# Patient Record
Sex: Male | Born: 1960 | Race: Black or African American | Hispanic: No | Smoking: Never smoker
Health system: Southern US, Community
[De-identification: ages and names within clinical notes are randomized; demographics above are authoritative.]

## PROBLEM LIST (undated history)

## (undated) DIAGNOSIS — E785 Hyperlipidemia, unspecified: Secondary | ICD-10-CM

## (undated) DIAGNOSIS — I471 Supraventricular tachycardia, unspecified: Secondary | ICD-10-CM

## (undated) DIAGNOSIS — F1010 Alcohol abuse, uncomplicated: Secondary | ICD-10-CM

## (undated) DIAGNOSIS — C801 Malignant (primary) neoplasm, unspecified: Secondary | ICD-10-CM

## (undated) DIAGNOSIS — R7303 Prediabetes: Secondary | ICD-10-CM

## (undated) DIAGNOSIS — O2230 Deep phlebothrombosis in pregnancy, unspecified trimester: Secondary | ICD-10-CM

## (undated) DIAGNOSIS — R1030 Lower abdominal pain, unspecified: Secondary | ICD-10-CM

## (undated) DIAGNOSIS — I639 Cerebral infarction, unspecified: Secondary | ICD-10-CM

## (undated) DIAGNOSIS — I82409 Acute embolism and thrombosis of unspecified deep veins of unspecified lower extremity: Secondary | ICD-10-CM

## (undated) DIAGNOSIS — N289 Disorder of kidney and ureter, unspecified: Secondary | ICD-10-CM

## (undated) DIAGNOSIS — I2510 Atherosclerotic heart disease of native coronary artery without angina pectoris: Secondary | ICD-10-CM

## (undated) DIAGNOSIS — I493 Ventricular premature depolarization: Secondary | ICD-10-CM

## (undated) DIAGNOSIS — I739 Peripheral vascular disease, unspecified: Secondary | ICD-10-CM

## (undated) DIAGNOSIS — Z1211 Encounter for screening for malignant neoplasm of colon: Secondary | ICD-10-CM

## (undated) DIAGNOSIS — I2109 ST elevation (STEMI) myocardial infarction involving other coronary artery of anterior wall: Secondary | ICD-10-CM

## (undated) DIAGNOSIS — M1990 Unspecified osteoarthritis, unspecified site: Secondary | ICD-10-CM

## (undated) DIAGNOSIS — I2699 Other pulmonary embolism without acute cor pulmonale: Secondary | ICD-10-CM

## (undated) DIAGNOSIS — R350 Frequency of micturition: Secondary | ICD-10-CM

## (undated) DIAGNOSIS — R300 Dysuria: Secondary | ICD-10-CM

## (undated) DIAGNOSIS — I10 Essential (primary) hypertension: Secondary | ICD-10-CM

## (undated) DIAGNOSIS — M5020 Other cervical disc displacement, unspecified cervical region: Secondary | ICD-10-CM

## (undated) DIAGNOSIS — I998 Other disorder of circulatory system: Secondary | ICD-10-CM

## (undated) DIAGNOSIS — I491 Atrial premature depolarization: Secondary | ICD-10-CM

## (undated) HISTORY — DX: Atrial premature depolarization: I49.1

## (undated) HISTORY — DX: Alcohol abuse, uncomplicated: F10.10

## (undated) HISTORY — DX: Hyperlipidemia, unspecified: E78.5

## (undated) HISTORY — DX: Ventricular premature depolarization: I49.3

## (undated) HISTORY — DX: Supraventricular tachycardia, unspecified: I47.10

## (undated) HISTORY — DX: Essential (primary) hypertension: I10

## (undated) HISTORY — PX: DOPPLER ECHOCARDIOGRAPHY: SHX263

## (undated) HISTORY — DX: Other pulmonary embolism without acute cor pulmonale: I26.99

## (undated) HISTORY — DX: Disorder of kidney and ureter, unspecified: N28.9

## (undated) HISTORY — DX: ST elevation (STEMI) myocardial infarction involving other coronary artery of anterior wall: I21.09

## (undated) HISTORY — DX: Supraventricular tachycardia: I47.1

## (undated) HISTORY — DX: Prediabetes: R73.03

## (undated) HISTORY — DX: Atherosclerotic heart disease of native coronary artery without angina pectoris: I25.10

## (undated) HISTORY — PX: LEG SURGERY: SHX1003

## (undated) HISTORY — DX: Deep phlebothrombosis in pregnancy, unspecified trimester: O22.30

---

## 2011-03-31 ENCOUNTER — Inpatient Hospital Stay (HOSPITAL_COMMUNITY)
Admission: EM | Admit: 2011-03-31 | Discharge: 2011-04-03 | DRG: 247 | Disposition: A | Discharge status: Home / Self Care | Payer: Self-pay | Source: Ambulatory Visit | Attending: Cardiovascular Disease | Admitting: Cardiovascular Disease

## 2011-03-31 ENCOUNTER — Emergency Department (HOSPITAL_COMMUNITY)
Admission: EM | Admit: 2011-03-31 | Discharge: 2011-03-31 | Disposition: A | Discharge status: Nursing Home (Includes ICF) | Payer: Self-pay | Attending: Emergency Medicine | Admitting: Emergency Medicine

## 2011-03-31 DIAGNOSIS — F17200 Nicotine dependence, unspecified, uncomplicated: Secondary | ICD-10-CM | POA: Insufficient documentation

## 2011-03-31 DIAGNOSIS — F1010 Alcohol abuse, uncomplicated: Secondary | ICD-10-CM | POA: Diagnosis present

## 2011-03-31 DIAGNOSIS — Z23 Encounter for immunization: Secondary | ICD-10-CM

## 2011-03-31 DIAGNOSIS — I2109 ST elevation (STEMI) myocardial infarction involving other coronary artery of anterior wall: Principal | ICD-10-CM | POA: Diagnosis present

## 2011-03-31 DIAGNOSIS — R079 Chest pain, unspecified: Secondary | ICD-10-CM

## 2011-03-31 DIAGNOSIS — I2510 Atherosclerotic heart disease of native coronary artery without angina pectoris: Secondary | ICD-10-CM

## 2011-03-31 DIAGNOSIS — I10 Essential (primary) hypertension: Secondary | ICD-10-CM | POA: Diagnosis present

## 2011-03-31 DIAGNOSIS — N289 Disorder of kidney and ureter, unspecified: Secondary | ICD-10-CM | POA: Diagnosis not present

## 2011-03-31 DIAGNOSIS — Y9210 Unspecified residential institution as the place of occurrence of the external cause: Secondary | ICD-10-CM | POA: Diagnosis not present

## 2011-03-31 DIAGNOSIS — I219 Acute myocardial infarction, unspecified: Secondary | ICD-10-CM | POA: Insufficient documentation

## 2011-03-31 DIAGNOSIS — Z79899 Other long term (current) drug therapy: Secondary | ICD-10-CM

## 2011-03-31 DIAGNOSIS — L270 Generalized skin eruption due to drugs and medicaments taken internally: Secondary | ICD-10-CM | POA: Diagnosis not present

## 2011-03-31 DIAGNOSIS — T50995A Adverse effect of other drugs, medicaments and biological substances, initial encounter: Secondary | ICD-10-CM | POA: Diagnosis not present

## 2011-03-31 LAB — POCT I-STAT, CHEM 8
BUN: 13 mg/dL (ref 6–23)
Calcium, Ion: 1.14 mmol/L (ref 1.12–1.32)
Chloride: 98 mEq/L (ref 96–112)
Chloride: 96 mEq/L (ref 96–112)
HCT: 51 % (ref 39.0–52.0)
Potassium: 3.3 mEq/L — ABNORMAL LOW (ref 3.5–5.1)
Potassium: 4.2 mEq/L (ref 3.5–5.1)
Sodium: 135 mEq/L (ref 135–145)

## 2011-03-31 LAB — DIFFERENTIAL
Basophils Absolute: 0 10*3/uL (ref 0.0–0.1)
Lymphocytes Relative: 16 % (ref 12–46)
Neutro Abs: 7.4 10*3/uL (ref 1.7–7.7)
Neutrophils Relative %: 78 % — ABNORMAL HIGH (ref 43–77)

## 2011-03-31 LAB — CBC
HCT: 50.3 % (ref 39.0–52.0)
Hemoglobin: 17.2 g/dL — ABNORMAL HIGH (ref 13.0–17.0)
RBC: 5.94 MIL/uL — ABNORMAL HIGH (ref 4.22–5.81)
RDW: 13.7 % (ref 11.5–15.5)
WBC: 9.5 10*3/uL (ref 4.0–10.5)

## 2011-03-31 LAB — POCT ACTIVATED CLOTTING TIME
Activated Clotting Time: 160 seconds
Activated Clotting Time: 188 seconds
Activated Clotting Time: 678 seconds

## 2011-03-31 LAB — COMPREHENSIVE METABOLIC PANEL
ALT: 23 U/L (ref 0–53)
AST: 89 U/L — ABNORMAL HIGH (ref 0–37)
CO2: 26 mEq/L (ref 19–32)
Calcium: 9.7 mg/dL (ref 8.4–10.5)
GFR calc non Af Amer: 67 mL/min — ABNORMAL LOW (ref >90)
Sodium: 132 mEq/L — ABNORMAL LOW (ref 135–145)
Total Protein: 9.2 g/dL — ABNORMAL HIGH (ref 6.0–8.3)

## 2011-03-31 LAB — MAGNESIUM: Magnesium: 2 mg/dL (ref 1.5–2.5)

## 2011-03-31 LAB — PRO B NATRIURETIC PEPTIDE: Pro B Natriuretic peptide (BNP): 1318 pg/mL — ABNORMAL HIGH (ref 0–125)

## 2011-03-31 LAB — PROTIME-INR: INR: 0.92 (ref 0.00–1.49)

## 2011-03-31 LAB — POCT I-STAT TROPONIN I

## 2011-03-31 LAB — APTT: aPTT: 28 seconds (ref 24–37)

## 2011-04-01 DIAGNOSIS — I219 Acute myocardial infarction, unspecified: Secondary | ICD-10-CM

## 2011-04-01 LAB — BASIC METABOLIC PANEL
BUN: 21 mg/dL (ref 6–23)
CO2: 23 mEq/L (ref 19–32)
Chloride: 98 mEq/L (ref 96–112)
Creatinine, Ser: 1.37 mg/dL — ABNORMAL HIGH (ref 0.50–1.35)

## 2011-04-01 LAB — LIPID PANEL
Cholesterol: 185 mg/dL (ref 0–200)
HDL: 62 mg/dL (ref >39)
Total CHOL/HDL Ratio: 3 RATIO
VLDL: 13 mg/dL (ref 0–40)

## 2011-04-01 LAB — CBC
HCT: 45 % (ref 39.0–52.0)
MCV: 84.1 fL (ref 78.0–100.0)
RBC: 5.35 MIL/uL (ref 4.22–5.81)
WBC: 12.7 10*3/uL — ABNORMAL HIGH (ref 4.0–10.5)

## 2011-04-02 LAB — CBC
MCH: 27.8 pg (ref 26.0–34.0)
Platelets: 205 10*3/uL (ref 150–400)
RBC: 5.08 MIL/uL (ref 4.22–5.81)
RDW: 13.7 % (ref 11.5–15.5)
WBC: 10.6 10*3/uL — ABNORMAL HIGH (ref 4.0–10.5)

## 2011-04-02 LAB — BASIC METABOLIC PANEL
CO2: 28 mEq/L (ref 19–32)
Calcium: 8.9 mg/dL (ref 8.4–10.5)
GFR calc Af Amer: 69 mL/min — ABNORMAL LOW (ref >90)
Sodium: 137 mEq/L (ref 135–145)

## 2011-04-02 LAB — T4: T4, Total: 7.4 ug/dL (ref 5.0–12.5)

## 2011-04-02 LAB — T3: T3, Total: 115 ng/dl (ref 80.0–204.0)

## 2011-04-03 DIAGNOSIS — I2109 ST elevation (STEMI) myocardial infarction involving other coronary artery of anterior wall: Secondary | ICD-10-CM

## 2011-04-03 LAB — BASIC METABOLIC PANEL
BUN: 20 mg/dL (ref 6–23)
Calcium: 9.1 mg/dL (ref 8.4–10.5)
Creatinine, Ser: 1.29 mg/dL (ref 0.50–1.35)
GFR calc Af Amer: 73 mL/min — ABNORMAL LOW (ref >90)
GFR calc non Af Amer: 63 mL/min — ABNORMAL LOW (ref >90)
Glucose, Bld: 99 mg/dL (ref 70–99)
Potassium: 3.7 mEq/L (ref 3.5–5.1)

## 2011-04-08 NOTE — Discharge Summary (Signed)
NAME:  Michael [name], Michael [name] NO.:  [id number]  MEDICAL RECORD NO.:  [id number]  LOCATION:  2020                         FACILITY:  [facility]  PHYSICIAN:  Noralyn Pick. Eden Emms, MD, FACCDATE OF BIRTH:  [birth year]  DATE OF ADMISSION:  03/31/2011 DATE OF DISCHARGE:  04/03/2011                              DISCHARGE SUMMARY   PRIMARY CARDIOLOGIST:  Verne Carrow, MD (new).  DISCHARGE DIAGNOSES: 1. Status post acute anterior ST-elevation myocardial infarction.     a.     Status post emergent drug-eluting stenting of proximal left      anterior descending.     b.     Severe INTRAVENOUS CONTRAST allergic reaction (left      ventriculogram deferred)     c.     Residual significant 3-vessel coronary artery disease with      100% obtuse marginal 3, 1st diagonal, and 3rd posterolateral      branch of distal right coronary artery. 2. Preserved left ventricular function, by echocardiography. 3. Renal insufficiency. 4. Low TSH, normal free T4.  SECONDARY DIAGNOSES: 1. Hypertension. 2. Tobacco. 3. Ethyl alcohol abuse.  REASON FOR ADMISSION:  The patient is a 50 year old male with no prior history of heart disease, recently relocated from [name], who presented to the emergency room complaining of chest pain, and was found to have anterior ST-segment elevation with reciprocal inferior ST- segment depression on electrocardiogram.  A code STEMI was called, and the patient was transferred emergently to the catheterization lab.  HOSPITAL COURSE:  The patient underwent emergent coronary angiography by Dr. Romilda Joy, as outlined above, subsequent successful drug-eluting stenting of the proximal LAD, which was 100% occluded.  The patient did, however, develop a severe allergic reaction to IV CONTRAST, but no frank  anaphylaxis, and was treated with IV Solu-Medrol, Benadryl, Pepcid, and ultimately subcutaneous epinephrine.  A 2D echocardiogram was done for assessment of LV  function (LV-gram deferred, secondary to severe contrast reaction), indicating overall preserved LV function with hypokinesis of the inferolateral base, and with no significant valvular abnormalities.  The patient presented with history of alcohol abuse, and was placed on withdrawal prophylaxis.  The TSH level was abnormal at 0.25, but a followup free T4 and T3 levels were within normal limits.  The patient was cleared for discharge on hospital day #3, in hemodynamically stable condition.  DISPOSITION:  Stable.  FOLLOWUP: 1. Dr. Verne Carrow in 2 weeks, arrangements to be made     through our office. 2. Dr. Andrey Campanile at [facility] on December 10 at 2:30 p.m.  DISCHARGE MEDICATIONS: 1. Aspirin 81 mg daily. 2. Effient 10 mg daily. 3. Lisinopril 5 mg daily. 4  Metoprolol 50 mg b.i.d. 1. Crestor 40 mg daily. 2. Nitrostat 0.4 mg p.r.n.  DISCHARGE LABS:  Sodium 136, potassium 3.7, BUN 20, creatinine 1.3.  Outstanding labs:  WBC 10.6, hemoglobin 14 hematocrit 44, platelets 205, pre discharge.  Lipid profile:  Total cholesterol 185, triglycerides 66,  HDL 62, and LDL 110.  TSH 0.25, T4 7.4, and T3 115.  CPK 1550/140 (9.1%);  troponin I 12.2 on admission.  BNP 1300 on admission.  DURATION OF DISCHARGE ENCOUNTER:  Greater than 30 minutes, including physician time.  Gene Serpe, PA-C   ______________________________ Noralyn Pick. Eden Emms, MD, [facility]    GS/MEDQ  D:  04/03/2011  T:  04/03/2011  Job:  [id number]  cc:   Dr. Andrey Campanile  Electronically Signed by Rozell Searing PA-C on 04/05/2011 10:46:21 AM Electronically Signed by Charlton Haws MD [facility] on 04/08/2011 12:27:57 PM

## 2011-04-13 NOTE — H&P (Signed)
NAMEMarland Kitchen  VESTER, BALTHAZOR            ACCOUNT NO.:  [id number]  MEDICAL RECORD NO.:  [id number]  LOCATION:  2913                         FACILITY:  [facility]  PHYSICIAN:  Lorine Bears, MD     DATE OF BIRTH:  1961/04/01  DATE OF ADMISSION:  03/31/2011 DATE OF DISCHARGE:                             HISTORY & PHYSICAL   CHIEF COMPLAINT:  Chest pain.  HISTORY OF PRESENT ILLNESS:  This is a 50 year old gentleman with no previous cardiac history.  He has past medical history of hypertension, as well as tobacco use.  The patient moved recently to [place] from [place], and he does not have a family doctor here in the [place].  He started having chest pain around 2 o'clock in the morning. It was described as substernal tightness radiating to his neck.  The patient did not seek medical attention right away and continued to have chest pain since 2 o'clock in the morning until he came to the emergency room around noontime.  He was found to have an anterior ST elevation with reciprocal ST depression in the inferior leads.  A code STEMI was called and the patient was transferred to the Cardiac Catheterization Lab.  PAST MEDICAL HISTORY: 1. Hypertension. 2. Tobacco use 3. Excessive alcohol use.  HOME MEDICATIONS:  Lisinopril unknown dose once daily.  ALLERGIES:  No known drug allergies.  However, the patient had an allergic reaction to the IV CONTRAST during the case.  SOCIAL HISTORY:  Remarkable for smoking half a pack per day for many years.  He drinks a minimal of 6 beers a day, and according to his significant other he drinks more when he is with his friends.  He denies any recreational drug use.  FAMILY HISTORY:  Negative for premature coronary artery disease.  REVIEW OF SYSTEMS:  A 10-point review of system was performed.  It is negative other than what is mentioned in the HPI.  PHYSICAL EXAMINATION:  GENERAL:  The patient appears to be at his stated age.  He has  significant distress due to discomfort. VITAL SIGNS:  Heart rate was ranging from 80 to 105, blood pressure 160/90, respiratory rate is 20, he is afebrile. HEENT:  Normocephalic, atraumatic. NECK:  No JVD or carotid bruits. RESPIRATORY:  Normal respiratory effort with no use of accessory muscles.  Auscultation revealed normal breath sounds. CARDIOVASCULAR EXAM:  Normal PMI.  Normal S1, S2 with no gallops or murmurs.  ABDOMEN:  Benign, nontender, nondistended. EXTREMITIES:  With no clubbing, cyanosis, or edema. SKIN:  Warm and dry with no rash. PSYCHIATRIC:  The patient is alert and oriented x3 with normal mood and affect.  LABORATORY AND DIAGNOSTIC DATA:  His creatinine and I-STAT was 1.5.  ECG showed normal sinus rhythm with 2 mm of ST elevation in V2 and V3 with ST depression in the inferior leads.  There is QT prolongation.  IMPRESSION: 1. Acute anterior ST-elevation myocardial infarction:  The patient has     significant chest pain and EKG changes consistent with anterior ST-     elevation myocardial infarction.  Cardiac catheterization was     performed which showed an occluded left anterior descending artery.     This was  treated with an angioplasty and drug-eluting stent     placement to the proximal left anterior descending artery.  The     patient will be admitted to the Intensive Care Unit.  We will     continue dual antiplatelet therapy for at least 1 year.  We will     start him on metoprolol.  I will hold his lisinopril due to     slightly elevated creatinine and contrast load.  This likely can be     resumed once his renal function is deemed to be stable.  The     patient will be placed on high-dose statin as well.  He does have     residual coronary artery disease in the diagonal, OM-3 as well as     right posterolateral branch.  These should be treated medically.     Revascularization will be reserved for refractory anginal symptoms. 2. Severe allergic reaction to  intravenous contrast but no     anaphylaxis: The patient was noted at the end of cardiac     percutaneous coronary intervention to have significant rash and he     started having severe itching.  He also started having some     swelling around his eyes but there was no compromise in his airways     and there was no drop in his blood pressure.  He was treated with     intravenous Solu-Medrol, Benadryl, and Pepcid.  However, he did not     have significant improvement and was very agitated.  Thus, I     decided to give him a small dose of subcutaneous epinephrine.  He     was given a total of 2 doses and responded very well.  He will be     monitored closely in the Intensive Care Unit. 3. Hypertension:  His blood pressure is elevated:  We will continue     metoprolol.  We will add nitroglycerin drip to control his blood     pressure. 4. Tobacco use:  The patient was advised to quit smoking. 5. Excessive alcohol use:  The patient will be placed on alcohol     withdrawal prevention protocol.     Lorine Bears, MD     MA/MEDQ  D:  03/31/2011  T:  04/01/2011  Job:  [id number]  Electronically Signed by Lorine Bears MD on 04/13/2011 04:06:58 PM

## 2011-04-13 NOTE — Cardiovascular Report (Signed)
NAME:  Michael [name], Michael [name]            ACCOUNT NO.:  [id number]  MEDICAL RECORD NO.:  [id number]  LOCATION:                                 FACILITY:  PHYSICIAN:  Lorine Bears, MD     DATE OF BIRTH:  1960/12/31  DATE OF PROCEDURE: DATE OF DISCHARGE:                           CARDIAC CATHETERIZATION   PROCEDURES PERFORMED: 1. Left heart catheterization. 2. Coronary angiography. 3. Left anterior descending artery angioplasty and drug-eluting stent     placement to the proximal segment. 4. Closure device.  INDICATION AND CLINICAL HISTORY:  This is a 50 year old gentleman with no previous cardiac history.  He has past medical history of hypertension and tobacco use.  He presented with chest pain which started around 2 o'clock in the morning.  The chest pain worsened throughout the day and he came to the emergency room where he was found to have anterior ST elevation with the ST depression in the inferior leads.  A code STEMI was called and the patient was transferred for emergent cardiac catheterization.  STUDY DETAILS:  The right groin area was prepped in a sterile fashion. It was anesthetized with 1% lidocaine.  A 6-French sheath was placed in the right femoral artery after an anterior puncture.  Coronary angiography was performed with a JR-4 catheter.  Left ventricular angiography was performed at the end of the case with a pigtail catheter after the coronary intervention.  Left coronary angiography was performed with an XB 3.5 guiding catheter.  This confirmed an occluded proximal left anterior descending artery and we proceeded with an angioplasty and stent placement.  INTERVENTIONAL PROCEDURE NOTE:  The patient was started on IV bivalirudin.  Initially, ACT was not therapeutic and it was suspected that the IV on the right side was not working properly.  It was switched to the left side and the patient was bolused and started again on IV bivalirudin with subsequent  therapeutic ACT.  He was given 60 mg of Effient.  I then tried to cross the lesion in the proximal LAD with a Prowater wire, which did not cross the lesion.  I then tried a traverse wire which also could not cross the lesion and kept going into a septal branch.  The wire was left in place in a septal branch and then I used an intuition wire, which did not cross the lesion either.  I then used a Fielder wire and was able to cross the lesion with difficulty.  There was no antegrade flow and I wanted to confirm intraluminal position. Thus, I used a 1.5 over the wire balloon.  Injection through the balloon was performed which confirmed intraluminal position.  I then placed a Prowater wire through balloon.  The lesion was dilated with a 1.5 balloon to 8 atmospheres twice.  I then dilated the lesion with a 2.5 x 12 mm Emerge balloon to 8 atmospheres twice.  I then placed a 2.75 x 24 mm Promus Element stent and deployed it to 11 atmospheres.  This was postdilated with an [place] Quantum balloon 3.0 x 20 mm to 12 atmospheres distally and 14 atmospheres proximally.  Angiography showed excellent results.  At this time, the patient started complaining of  itching and he was noted to have redness in his face and also we noted rash in his abdominal area.  Thus, I decided not to perform an LV-gram.  I crossed the aortic valve and recorded the pressure with pullback without left ventricular angiography.  The sheath was then removed and the site was closed with a Perclose device.  COMPLICATIONS:  The patient had a severe allergic reaction likely to IV CONTRAST, but without frank anaphylaxis.  This was treated with 125 mg of IV Solu-Medrol, 50 mg of IV Benadryl as well as IV Pepcid.  The patient continued to be symptomatic in spite of that and he started having some swelling around his eyes.  His vital signs were stable.  I thus decided to treat him with subcutaneous epinephrine 0.1 mg which was repeated  twice.  The patient had improvement in his symptoms.  CORONARY ANGIOGRAPHY: 1. Right coronary artery: The vessel is large in size and dominant.     There is a mild 20% disease in the mid segment with minor luminal     irregularities distally.  The PDA is normal in size and free of     significant disease.  First posterolateral branch is normal in size     with 40% proximal disease.  The second posterolateral branch is     relatively large in size with 50% disease at the ostium followed by     a 70% lesion proximally and diffuse 80% disease distally.  The RCA     gives collaterals to the left anterior descending artery as well as     OM-3. 2. Left main coronary artery: The vessel is normal in size without     significant disease. 3. Left anterior descending artery: The vessel is normal in size and     is occluded proximally a small septal branch.  The distal LAD does     not have significant disease.  The first diagonal branch is normal     to large in size and has 2 branches.  The superior branch is     overall normal.  The inferior branch has a 90% proximal disease. 4. Left circumflex artery:  The vessel is normal in size and     nondominant.  The first obtuse marginal is normal in size with a     mild proximal disease.  The second OM is normal in size with no     significant disease.  Third OM appears to be occluded with     collaterals supplied from the distal LAD and the right coronary     artery.  HEMODYNAMIC FINDINGS:  Left ventricular pressure is 162/8 with a left ventricular end-diastolic pressure of 16 mmHg.  Aortic pressure is 165/105 with a mean pressure of 131 mmHg.  LV-gram was not performed due to allergic reaction to the IV CONTRAST.  STUDY CONCLUSIONS: 1. Acute anterior ST-elevation myocardial infarction due to an     occluded proximal left anterior descending artery. 2. Significant three-vessel coronary artery disease with residual     disease and occluded OM-3,  first diagonal as well as third     posterolateral branch of the distal right coronary artery. 3. Successful angioplasty and drug-eluting stent placement to the     proximal LAD. 4. Severe allergic reaction to the IV CONTRAST which was treated     successfully with IV Solu-Medrol, Benadryl, Pepcid, and later     subcutaneous epinephrine.  RECOMMENDATIONS:  The patient will be monitored  closely in the Intensive Care Unit.  Continue treatment with aspirin as well as Effient for at least 1 year.  We will obtain an echocardiogram given that no LV-gram was performed.  The patient should be treated with aggressive medical therapy for his residual coronary artery disease.     Lorine Bears, MD     MA/MEDQ  D:  03/31/2011  T:  04/01/2011  Job:  [id number]  Electronically Signed by Lorine Bears MD on 04/13/2011 04:07:34 PM

## 2011-05-14 ENCOUNTER — Encounter: Payer: Self-pay | Admitting: *Deleted

## 2011-05-17 ENCOUNTER — Ambulatory Visit: Payer: Self-pay | Admitting: Cardiovascular Disease

## 2011-05-18 ENCOUNTER — Telehealth: Payer: Self-pay | Admitting: Cardiovascular Disease

## 2011-05-18 NOTE — Telephone Encounter (Signed)
Left message to call back  

## 2011-05-18 NOTE — Telephone Encounter (Signed)
New Msg: Pt calling wanting to know if pt can get samples of effient. Please return pt call to discuss further.

## 2011-05-24 NOTE — Telephone Encounter (Signed)
Spoke with pt. He has appointment with Dr.McAlhany tomorrow at 3:30.  I told pt I would have samples of Effient here for him when he comes in for appt.

## 2011-05-24 NOTE — Telephone Encounter (Signed)
Follow up from previous call:  Sample of effient.

## 2011-05-25 ENCOUNTER — Encounter: Payer: Self-pay | Admitting: Cardiovascular Disease

## 2011-05-25 ENCOUNTER — Ambulatory Visit (INDEPENDENT_AMBULATORY_CARE_PROVIDER_SITE_OTHER): Payer: Self-pay | Admitting: Cardiovascular Disease

## 2011-05-25 VITALS — BP 154/90 | HR 68 | Ht 68.0 in | Wt 214.0 lb

## 2011-05-25 DIAGNOSIS — I2510 Atherosclerotic heart disease of native coronary artery without angina pectoris: Secondary | ICD-10-CM

## 2011-05-25 MED ORDER — SIMVASTATIN 40 MG PO TABS: 40.0000 mg | ORAL_TABLET | Freq: Every evening | ORAL | Status: DC

## 2011-05-25 NOTE — Assessment & Plan Note (Signed)
Stable post STEMI. DES in LAD. MOderate disease in other vessels as above. Will change Crestor to Zocor 40 mg po QHS for cost. Continue beta blocker and Ace-inh. Continue ASA and Effient. Samples of effient given today and his assistance paperwork is filled out.

## 2011-05-25 NOTE — Progress Notes (Signed)
History of Present Illness:50 yo AAM with history of CAD with recent admission to [facility] 03/31/11 with anterior STEMI with occluded LAD now s/p DES x 1 proximal LAD, HTN, tobacco abuse here today for cardiac follow up. His cath was performed by Dr. Kirke Corin. He had a severe dye reaction. LV function was preserved by echo. He tells me that he has been doing well since discharge. No chest pain, SOB. He has been exercising. He ran out of Effient one week ago. He has missing some days of his other medications.  He also reports dietary non-compliance.   Past Medical History  Diagnosis Date  . Anterior myocardial infarction     ST-elevation; S/P emergent  drug-eluting stenting of proximal left anterior descending  . Coronary artery disease     Cath October 2012-moderate disease in RCA, Circumflex  . Renal insufficiency   . Hypertension   . Tobacco abuse   . Alcohol abuse     Past Surgical History  Procedure Date  . Doppler echocardiography     Preserved left venticular function  . Leg surgery     Current Outpatient Prescriptions  Medication Sig Dispense Refill  . aspirin EC 81 MG tablet Take 81 mg by mouth daily.        Marland Kitchen lisinopril (PRINIVIL,ZESTRIL) 5 MG tablet Take 5 mg by mouth daily.        . metoprolol (LOPRESSOR) 50 MG tablet Take 50 mg by mouth 2 (two) times daily.        . nitroGLYCERIN (NITROSTAT) 0.4 MG SL tablet Place 0.4 mg under the tongue every 5 (five) minutes as needed.        . prasugrel (EFFIENT) 10 MG TABS Take 10 mg by mouth daily.        . rosuvastatin (CRESTOR) 40 MG tablet Take 40 mg by mouth daily.          Allergies  Allergen Reactions  . Ivp Dye (Iodinated Diagnostic Agents)     History   Social History  . Marital Status: Single    Spouse Name: N/A    Number of Children: 0  . Years of Education: N/A   Occupational History  . Not on file.   Social History Main Topics  . Smoking status: Former Games developer  . Smokeless tobacco: Not on file  . Alcohol Use:  No  . Drug Use: No  . Sexually Active: Not on file   Other Topics Concern  . Not on file   Social History Narrative  . No narrative on file    Family History  Problem Relation Age of Onset  . Hyperlipidemia    . Diabetes    . Heart disease      Review of Systems:  As stated in the HPI and otherwise negative.   BP 154/90  Pulse 68  Ht 5\' 8"  (1.727 m)  Wt 214 lb (97.07 kg)  BMI 32.54 kg/m2  Physical Examination: General: Well developed, well nourished, NAD HEENT: OP clear, mucus membranes moist SKIN: warm, dry. No rashes. Neuro: No focal deficits Musculoskeletal: Muscle strength 5/5 all ext Psychiatric: Mood and affect normal Neck: No JVD, no carotid bruits, no thyromegaly, no lymphadenopathy. Lungs:Clear bilaterally, no wheezes, rhonci, crackles Cardiovascular: Regular rate and rhythm. No murmurs, gallops or rubs. Abdomen:Soft. Bowel sounds present. Non-tender.  Extremities: No lower extremity edema. Pulses are 2 + in the bilateral DP/PT.  EKG: NSR, rate 73 bpm. NS STTW changes  Cardiac Cath 03/31/11: 1. Right coronary artery:  The vessel is large in size and dominant.  There is a mild 20% disease in the mid segment with minor luminal  irregularities distally. The PDA is normal in size and free of  significant disease. First posterolateral branch is normal in size  with 40% proximal disease. The second posterolateral branch is  relatively large in size with 50% disease at the ostium followed by  a 70% lesion proximally and diffuse 80% disease distally. The RCA  gives collaterals to the left anterior descending artery as well as  OM-3.  2. Left main coronary artery: The vessel is normal in size without  significant disease.  3. Left anterior descending artery: The vessel is normal in size and  is occluded proximally a small septal branch. The distal LAD does  not have significant disease. The first diagonal branch is normal  to large in size and has 2 branches.  The superior branch is  overall normal. The inferior branch has a 90% proximal disease.  4. Left circumflex artery: The vessel is normal in size and  nondominant. The first obtuse marginal is normal in size with a  mild proximal disease. The second OM is normal in size with no  significant disease. Third OM appears to be occluded with  collaterals supplied from the distal LAD and the right coronary  artery.

## 2011-05-25 NOTE — Patient Instructions (Signed)
Your physician wants you to follow-up in: 6 months.  You will receive a reminder letter in the mail two months in advance. If you don't receive a letter, please call our office to schedule the follow-up appointment.  Your physician has recommended you make the following change in your medication: Stop Crestor after you finish the bottle you have now.  After stopping Crestor start Simvastatin 40 mg by mouth daily.

## 2011-05-25 NOTE — Telephone Encounter (Signed)
Pt given 42 tablets of Effient when here for office visit with Dr. Clifton James today.  Pt brought completed paperwork and financial statement in today.  I faxed this to [facility].

## 2011-06-09 ENCOUNTER — Other Ambulatory Visit: Payer: Self-pay | Admitting: Cardiovascular Disease

## 2011-06-09 DIAGNOSIS — I2510 Atherosclerotic heart disease of native coronary artery without angina pectoris: Secondary | ICD-10-CM

## 2011-06-09 NOTE — Telephone Encounter (Signed)
New problem:  [facility]- [facility] [employer]  [phone].

## 2011-06-10 MED ORDER — SIMVASTATIN 40 MG PO TABS: 40.0000 mg | ORAL_TABLET | Freq: Every evening | ORAL | Status: DC

## 2011-06-10 MED ORDER — PRASUGREL HCL 10 MG PO TABS: 10.0000 mg | ORAL_TABLET | Freq: Every day | ORAL | Status: DC

## 2011-06-10 MED ORDER — LISINOPRIL 5 MG PO TABS: 5.0000 mg | ORAL_TABLET | Freq: Every day | ORAL | Status: DC

## 2011-06-10 MED ORDER — METOPROLOL TARTRATE 50 MG PO TABS: 50.0000 mg | ORAL_TABLET | Freq: Two times a day (BID) | ORAL | Status: DC

## 2011-06-29 ENCOUNTER — Telehealth: Payer: Self-pay | Admitting: *Deleted

## 2011-06-29 NOTE — Telephone Encounter (Signed)
SPOKE WITH PT  RE  RECEIVING EFFIENT   VIA MAIL ( ASSISTANCE  PROGRAM ). PT AWARE WILL LEAVE  MED AT FRONT DESK FOR PICK UP

## 2011-07-08 ENCOUNTER — Encounter (HOSPITAL_COMMUNITY)
Admission: RE | Admit: 2011-07-08 | Discharge: 2011-07-08 | Disposition: A | Discharge status: Home / Self Care | Payer: Self-pay | Source: Ambulatory Visit | Attending: Cardiovascular Disease | Admitting: Cardiovascular Disease

## 2011-07-08 ENCOUNTER — Encounter (HOSPITAL_COMMUNITY): Payer: Self-pay

## 2011-07-08 DIAGNOSIS — N289 Disorder of kidney and ureter, unspecified: Secondary | ICD-10-CM | POA: Insufficient documentation

## 2011-07-08 DIAGNOSIS — I252 Old myocardial infarction: Secondary | ICD-10-CM | POA: Insufficient documentation

## 2011-07-08 DIAGNOSIS — I10 Essential (primary) hypertension: Secondary | ICD-10-CM | POA: Insufficient documentation

## 2011-07-08 DIAGNOSIS — Z9861 Coronary angioplasty status: Secondary | ICD-10-CM | POA: Insufficient documentation

## 2011-07-08 DIAGNOSIS — I2510 Atherosclerotic heart disease of native coronary artery without angina pectoris: Secondary | ICD-10-CM | POA: Insufficient documentation

## 2011-07-08 DIAGNOSIS — Z79899 Other long term (current) drug therapy: Secondary | ICD-10-CM | POA: Insufficient documentation

## 2011-07-08 DIAGNOSIS — Z5189 Encounter for other specified aftercare: Secondary | ICD-10-CM | POA: Insufficient documentation

## 2011-07-08 DIAGNOSIS — F17200 Nicotine dependence, unspecified, uncomplicated: Secondary | ICD-10-CM | POA: Insufficient documentation

## 2011-07-09 ENCOUNTER — Telehealth: Payer: Self-pay | Admitting: *Deleted

## 2011-07-09 DIAGNOSIS — I2510 Atherosclerotic heart disease of native coronary artery without angina pectoris: Secondary | ICD-10-CM

## 2011-07-09 MED ORDER — SIMVASTATIN 40 MG PO TABS: 40.0000 mg | ORAL_TABLET | Freq: Every evening | ORAL | Status: DC

## 2011-07-09 NOTE — Telephone Encounter (Signed)
Spoke with pt. Per note from Cardiac rehab pt was not taking Crestor due to cost.  He was changed to Simvastatin 40 mg daily at office visit in December with Dr. Clifton James due to cost of Crestor.  Pt will start simvastatin 40 mg and I will send prescription to [facility] on [address] for this.  He is aware he should be taking lisinopril and metoprolol and he has resumed taking these.

## 2011-07-12 ENCOUNTER — Encounter (HOSPITAL_COMMUNITY)
Admission: RE | Admit: 2011-07-12 | Discharge: 2011-07-12 | Disposition: A | Discharge status: Home / Self Care | Payer: Self-pay | Source: Ambulatory Visit | Attending: Cardiovascular Disease | Admitting: Cardiovascular Disease

## 2011-07-12 NOTE — Progress Notes (Addendum)
Michael [name] started cardiac rehab today.  Michael [name] is taking all of his medications except his simvastatin. Michael [name] says he plans to get this medication on Friday.  Telemetry Sinus rhythm with arrythmia  Questionable PAC's, frequent at times.  Rare PVC . Patient asymptomatic, vital signs stable.  ECG tracings sent to Dr Gibson Ramp office via Careers information officer.  Dr Johney Frame reviewed ECG tracings.  No treatment warranted. Gary tolerated exercise without difficulty.  Will continue to monitor the patient throughout  the program.

## 2011-07-14 ENCOUNTER — Encounter (HOSPITAL_COMMUNITY)
Admission: RE | Admit: 2011-07-14 | Discharge: 2011-07-14 | Disposition: A | Discharge status: Home / Self Care | Payer: Self-pay | Source: Ambulatory Visit | Attending: Cardiovascular Disease | Admitting: Cardiovascular Disease

## 2011-07-16 ENCOUNTER — Encounter (HOSPITAL_COMMUNITY)
Admission: RE | Admit: 2011-07-16 | Discharge: 2011-07-16 | Disposition: A | Discharge status: Home / Self Care | Payer: Self-pay | Source: Ambulatory Visit | Attending: Cardiovascular Disease | Admitting: Cardiovascular Disease

## 2011-07-16 DIAGNOSIS — Z9861 Coronary angioplasty status: Secondary | ICD-10-CM | POA: Insufficient documentation

## 2011-07-16 DIAGNOSIS — I10 Essential (primary) hypertension: Secondary | ICD-10-CM | POA: Insufficient documentation

## 2011-07-16 DIAGNOSIS — I2510 Atherosclerotic heart disease of native coronary artery without angina pectoris: Secondary | ICD-10-CM | POA: Insufficient documentation

## 2011-07-16 DIAGNOSIS — Z79899 Other long term (current) drug therapy: Secondary | ICD-10-CM | POA: Insufficient documentation

## 2011-07-16 DIAGNOSIS — F17200 Nicotine dependence, unspecified, uncomplicated: Secondary | ICD-10-CM | POA: Insufficient documentation

## 2011-07-16 DIAGNOSIS — Z5189 Encounter for other specified aftercare: Secondary | ICD-10-CM | POA: Insufficient documentation

## 2011-07-16 DIAGNOSIS — N289 Disorder of kidney and ureter, unspecified: Secondary | ICD-10-CM | POA: Insufficient documentation

## 2011-07-16 DIAGNOSIS — I252 Old myocardial infarction: Secondary | ICD-10-CM | POA: Insufficient documentation

## 2011-07-16 NOTE — Progress Notes (Signed)
Michael [name] 51 y.o. male       Nutrition Screen                                                                    YES  NO Do you live in a nursing home?  X   Do you eat out more than 3 times/week?    X If yes, how many times per week do you eat out?  Do you have food allergies?   X If yes, what are you allergic to?  Have you gained or lost more than 10 lbs without trying?              X  If yes, how much weight have you lost and over what time period? 6 lbs gained or lost over  weeks/month  Do you want to lose weight?    X  If yes, what is a goal weight or amount of weight you would like to lose? 75 lb  Do you eat alone most of the time?   X   Do you eat less than 2 meals/day?  X If yes, how many meals do you eat?  Do you drink more than 3 alcohol drinks/day?  X If yes, how many drinks per day?  Are you having trouble with constipation? *  X If yes, what are you doing to help relieve constipation?  Do you have financial difficulties with buying food?*    X   Are you experiencing regular nausea/ vomiting?*     X   Do you have a poor appetite? *                                        X   Do you have trouble chewing/swallowing? *   X    Pt with diagnoses of:  X Stent/ PTCA X Dyslipidemia  / HDL< 40 / LDL>70 / High TG      X %  Body fat >goal / Body Mass Index >25 X HTN / BP >120/80 X MI       Pt Risk Score   2       Diagnosis Risk Score  25       Total Risk Score   27                         High Risk               X Low Risk    HT: 68.25" Ht Readings from Last 1 Encounters:  07/08/11 5' 8.25" (1.734 m)    WT:   214.1 lb (97.3 kg) Wt Readings from Last 3 Encounters:  07/08/11 214 lb 8.1 oz (97.3 kg)  05/25/11 214 lb (97.07 kg)     IBW 70.7 138%IBW BMI 32.4 27.7%body fat  Meds reviewed. Past Medical History  Diagnosis Date  . Anterior myocardial infarction     ST-elevation; S/P emergent  drug-eluting stenting of proximal left anterior descending  . Coronary artery  disease     Cath October 2012-moderate disease in RCA, Circumflex  . Renal insufficiency   .  Hypertension   . Tobacco abuse   . Alcohol abuse        Activity level: Pt is moderately active  Wt goal: 190-202 lb ( 86.4-91.8 kg) Current tobacco use? No     Pt quit tobacco use on 03/31/11 Food/Drug Interaction? No       Labs:  Lipid Panel     Component Value Date/Time   CHOL 185 04/01/2011 0545   TRIG 66 04/01/2011 0545   HDL 62 04/01/2011 0545   CHOLHDL 3.0 04/01/2011 0545   VLDL 13 04/01/2011 0545   LDLCALC 110* 04/01/2011 0545   No results found for this basename: HGBA1C   04/03/11 Glucose 99  LDL goal:< 70      MI,  and > 2:      HTN, > 51 yo male,  Estimated Daily Nutrition Needs for: ? wt loss  1550-2050 Kcal , Total Fat 40-55gm, Saturated Fat 11-16 gm, Trans Fat 1.5-2.0 gm,  Sodium less than 1500 mg

## 2011-07-19 ENCOUNTER — Encounter (HOSPITAL_COMMUNITY): Payer: Self-pay

## 2011-07-21 ENCOUNTER — Encounter (HOSPITAL_COMMUNITY): Payer: Self-pay

## 2011-07-22 ENCOUNTER — Telehealth (HOSPITAL_COMMUNITY): Payer: Self-pay | Admitting: *Deleted

## 2011-07-23 ENCOUNTER — Encounter (HOSPITAL_COMMUNITY): Payer: Self-pay

## 2011-07-26 ENCOUNTER — Encounter (HOSPITAL_COMMUNITY): Payer: Self-pay

## 2011-07-28 ENCOUNTER — Encounter (HOSPITAL_COMMUNITY): Payer: Self-pay

## 2011-07-30 ENCOUNTER — Encounter (HOSPITAL_COMMUNITY): Payer: Self-pay

## 2011-08-02 ENCOUNTER — Encounter (HOSPITAL_COMMUNITY): Payer: Self-pay

## 2011-08-04 ENCOUNTER — Encounter (HOSPITAL_COMMUNITY): Payer: Self-pay

## 2011-08-05 ENCOUNTER — Telehealth (HOSPITAL_COMMUNITY): Payer: Self-pay | Admitting: *Deleted

## 2011-08-06 ENCOUNTER — Ambulatory Visit (HOSPITAL_COMMUNITY): Payer: Self-pay

## 2011-08-06 ENCOUNTER — Encounter (HOSPITAL_COMMUNITY): Payer: Self-pay

## 2011-08-06 ENCOUNTER — Telehealth (HOSPITAL_COMMUNITY): Payer: Self-pay | Admitting: *Deleted

## 2011-08-09 ENCOUNTER — Ambulatory Visit (HOSPITAL_COMMUNITY): Payer: Self-pay

## 2011-08-09 ENCOUNTER — Encounter (HOSPITAL_COMMUNITY): Payer: Self-pay

## 2011-08-09 NOTE — Progress Notes (Signed)
Cardiac Rehabilitation Program Progress Report   Orientation:  07/08/2011  Discharge Date:  08/06/2011 # of sessions completed: 2/26  Cardiologist: Clifton James Family MD:  Dala Dock Class Time:  1445  A.  Exercise Program:  Tolerates exercise @ 2.6 METS for 30 minutes, Bike Test Results:  Pre: 1.11 miles and Discharged  B.  Mental Health:  Good mental attitude, Significant job stress and Quality of Life (QOL) Pre Scores:  Overall  24.40, Health/Functioning 23.73, Socioeconomics 23.41, Psych/Spiritual 28.21, Family 22.80    C.  Education/Instruction/Skills  Uses Perceived Exertion Scale and/or Dyspnea Scale and Attended 1/13 education classes  No Home Exercise Given, not here long enough  D.  Nutrition/Weight Control/Body Composition:  Adherence to prescribed nutrition program: poor  *This section completed by Mickle Plumb, Andres Shad, RD, LDN, CDE  E.  Blood Lipids    Lab Results  Component Value Date   CHOL 185 04/01/2011     Lab Results  Component Value Date   TRIG 66 04/01/2011     Lab Results  Component Value Date   HDL 62 04/01/2011     Lab Results  Component Value Date   CHOLHDL 3.0 04/01/2011     No results found for this basename: LDLDIRECT      F.  Lifestyle Changes:  Needs physician encouragement on making lifestyle changes  G.  Symptoms noted with exercise:  Asymptomatic  Report Completed By:  Hazle Nordmann   Comments:  Pt dropped from program after only two session due to work commitments.  Pt did fair in program with 2.6 METs average for his two days.  Upon dropping, pt was encouraged to walk on his own, but I do not think he will be very compliant with his exercise.  At d/c his rhythm was sinus.  Thanks for the referral. Fabio Pierce, MA, ACSM RCEP

## 2011-08-11 ENCOUNTER — Encounter (HOSPITAL_COMMUNITY): Payer: Self-pay

## 2011-08-11 ENCOUNTER — Ambulatory Visit (HOSPITAL_COMMUNITY): Payer: Self-pay

## 2011-08-11 NOTE — Progress Notes (Signed)
Cardiac Rehabilitation Program Progress Report   Orientation:  07/08/2011  Discharge Date:  08/06/2011 # of sessions completed: 2/26  Cardiologist: Clifton James Family MD:  Dala Dock Class Time:  1445  A.  Exercise Program:  Tolerates exercise @ 2.6 METS for 30 minutes, Bike Test Results:  Pre: 1.11 miles and Discharged  B.  Mental Health:  Good mental attitude, Significant job stress and Quality of Life (QOL) Pre Scores:  Overall  24.40, Health/Functioning 23.73, Socioeconomics 23.41, Psych/Spiritual 28.21, Family 22.80    C.  Education/Instruction/Skills  Uses Perceived Exertion Scale and/or Dyspnea Scale and Attended 1/13 education classes  No Home Exercise Given, not here long enough  D.  Nutrition/Weight Control/Body Composition:  Adherence to prescribed nutrition program: fair.  Pt following a step 1 TLC diet.  *This section completed by Mickle Plumb, Andres Shad, RD, LDN, CDE  E.  Blood Lipids Lipid Panel     Component Value Date/Time   CHOL 185 04/01/2011 0545   TRIG 66 04/01/2011 0545   HDL 62 04/01/2011 0545   CHOLHDL 3.0 04/01/2011 0545   VLDL 13 04/01/2011 0545   LDLCALC 110* 04/01/2011 0545   F.  Lifestyle Changes:  Needs physician encouragement on making lifestyle changes  G.  Symptoms noted with exercise:  Asymptomatic  Report Completed By:  Fabio Pierce, MA, ACSM RCEP  Comments:  Pt dropped from program after only two session due to work commitments.  Pt did fair in program with 2.6 METs average for his two days.  Upon dropping, pt was encouraged to walk on his own, but I do not think he will be very compliant with his exercise.  At d/c his rhythm was sinus.  Thanks for the referral. Fabio Pierce, MA, ACSM RCEP

## 2011-08-13 ENCOUNTER — Ambulatory Visit (HOSPITAL_COMMUNITY): Payer: Self-pay

## 2011-08-13 ENCOUNTER — Encounter (HOSPITAL_COMMUNITY): Payer: Self-pay

## 2011-08-16 ENCOUNTER — Encounter (HOSPITAL_COMMUNITY): Payer: Self-pay

## 2011-08-16 ENCOUNTER — Ambulatory Visit (HOSPITAL_COMMUNITY): Payer: Self-pay

## 2011-08-18 ENCOUNTER — Ambulatory Visit (HOSPITAL_COMMUNITY): Payer: Self-pay

## 2011-08-18 ENCOUNTER — Encounter (HOSPITAL_COMMUNITY): Payer: Self-pay

## 2011-08-20 ENCOUNTER — Encounter (HOSPITAL_COMMUNITY): Payer: Self-pay

## 2011-08-20 ENCOUNTER — Ambulatory Visit (HOSPITAL_COMMUNITY): Payer: Self-pay

## 2011-08-23 ENCOUNTER — Ambulatory Visit (HOSPITAL_COMMUNITY): Payer: Self-pay

## 2011-08-23 ENCOUNTER — Encounter (HOSPITAL_COMMUNITY): Payer: Self-pay

## 2011-08-25 ENCOUNTER — Encounter (HOSPITAL_COMMUNITY): Payer: Self-pay

## 2011-08-25 ENCOUNTER — Ambulatory Visit (HOSPITAL_COMMUNITY): Payer: Self-pay

## 2011-08-27 ENCOUNTER — Ambulatory Visit (HOSPITAL_COMMUNITY): Payer: Self-pay

## 2011-08-27 ENCOUNTER — Encounter (HOSPITAL_COMMUNITY): Payer: Self-pay

## 2011-08-30 ENCOUNTER — Ambulatory Visit (HOSPITAL_COMMUNITY): Payer: Self-pay

## 2011-08-30 ENCOUNTER — Encounter (HOSPITAL_COMMUNITY): Payer: Self-pay

## 2011-09-01 ENCOUNTER — Ambulatory Visit (HOSPITAL_COMMUNITY): Payer: Self-pay

## 2011-09-01 ENCOUNTER — Encounter (HOSPITAL_COMMUNITY): Payer: Self-pay

## 2011-09-03 ENCOUNTER — Encounter (HOSPITAL_COMMUNITY): Payer: Self-pay

## 2011-09-03 ENCOUNTER — Ambulatory Visit (HOSPITAL_COMMUNITY): Payer: Self-pay

## 2011-09-06 ENCOUNTER — Ambulatory Visit (HOSPITAL_COMMUNITY): Payer: Self-pay

## 2011-09-06 ENCOUNTER — Encounter (HOSPITAL_COMMUNITY): Payer: Self-pay

## 2011-09-08 ENCOUNTER — Encounter (HOSPITAL_COMMUNITY): Payer: Self-pay

## 2011-09-08 ENCOUNTER — Ambulatory Visit (HOSPITAL_COMMUNITY): Payer: Self-pay

## 2011-09-10 ENCOUNTER — Encounter (HOSPITAL_COMMUNITY): Payer: Self-pay

## 2011-09-10 ENCOUNTER — Ambulatory Visit (HOSPITAL_COMMUNITY): Payer: Self-pay

## 2011-09-13 ENCOUNTER — Encounter (HOSPITAL_COMMUNITY): Payer: Self-pay

## 2011-09-13 ENCOUNTER — Ambulatory Visit (HOSPITAL_COMMUNITY): Payer: Self-pay

## 2011-09-15 ENCOUNTER — Encounter (HOSPITAL_COMMUNITY): Payer: Self-pay

## 2011-09-15 ENCOUNTER — Ambulatory Visit (HOSPITAL_COMMUNITY): Payer: Self-pay

## 2011-09-17 ENCOUNTER — Ambulatory Visit (HOSPITAL_COMMUNITY): Payer: Self-pay

## 2011-09-17 ENCOUNTER — Encounter (HOSPITAL_COMMUNITY): Payer: Self-pay

## 2011-09-20 ENCOUNTER — Ambulatory Visit (HOSPITAL_COMMUNITY): Payer: Self-pay

## 2011-09-20 ENCOUNTER — Encounter (HOSPITAL_COMMUNITY): Payer: Self-pay

## 2011-09-22 ENCOUNTER — Encounter (HOSPITAL_COMMUNITY): Payer: Self-pay

## 2011-09-22 ENCOUNTER — Ambulatory Visit (HOSPITAL_COMMUNITY): Payer: Self-pay

## 2011-09-23 NOTE — Progress Notes (Signed)
Agree with progress report from cardiac rehab on 08/11/2011 written by Trevor Mace and and Heloise Purpura.

## 2011-09-24 ENCOUNTER — Encounter (HOSPITAL_COMMUNITY): Payer: Self-pay

## 2011-09-24 ENCOUNTER — Ambulatory Visit (HOSPITAL_COMMUNITY): Payer: Self-pay

## 2011-09-27 ENCOUNTER — Ambulatory Visit (HOSPITAL_COMMUNITY): Payer: Self-pay

## 2011-09-27 ENCOUNTER — Encounter (HOSPITAL_COMMUNITY): Payer: Self-pay

## 2011-09-29 ENCOUNTER — Encounter (HOSPITAL_COMMUNITY): Payer: Self-pay

## 2011-09-29 ENCOUNTER — Ambulatory Visit (HOSPITAL_COMMUNITY): Payer: Self-pay

## 2011-10-01 ENCOUNTER — Ambulatory Visit (HOSPITAL_COMMUNITY): Payer: Self-pay

## 2011-10-01 ENCOUNTER — Encounter (HOSPITAL_COMMUNITY): Payer: Self-pay

## 2011-10-04 ENCOUNTER — Ambulatory Visit (HOSPITAL_COMMUNITY): Payer: Self-pay

## 2011-10-04 ENCOUNTER — Encounter (HOSPITAL_COMMUNITY): Payer: Self-pay

## 2011-10-06 ENCOUNTER — Encounter (HOSPITAL_COMMUNITY): Payer: Self-pay

## 2011-10-06 ENCOUNTER — Ambulatory Visit (HOSPITAL_COMMUNITY): Payer: Self-pay

## 2011-10-08 ENCOUNTER — Encounter (HOSPITAL_COMMUNITY): Payer: Self-pay

## 2011-10-08 ENCOUNTER — Ambulatory Visit (HOSPITAL_COMMUNITY): Payer: Self-pay

## 2011-10-11 ENCOUNTER — Ambulatory Visit (HOSPITAL_COMMUNITY): Payer: Self-pay

## 2011-10-11 ENCOUNTER — Encounter (HOSPITAL_COMMUNITY): Payer: Self-pay

## 2011-10-13 ENCOUNTER — Telehealth: Payer: Self-pay | Admitting: Cardiovascular Disease

## 2011-10-13 ENCOUNTER — Ambulatory Visit (HOSPITAL_COMMUNITY): Payer: Self-pay

## 2011-10-13 ENCOUNTER — Encounter (HOSPITAL_COMMUNITY): Payer: Self-pay

## 2011-10-13 NOTE — Telephone Encounter (Signed)
Advised pt that we are currently out of Effient samples.

## 2011-10-13 NOTE — Telephone Encounter (Signed)
Please return call to patient at [phone], regarding Effient samples.

## 2011-10-14 ENCOUNTER — Telehealth: Payer: Self-pay | Admitting: Cardiovascular Disease

## 2011-10-14 NOTE — Telephone Encounter (Signed)
Spoke with pt. He needs reorder of Effient through assistance program. He just opened his last bottle.  I told pt I would send paperwork in today and call him when it arrives in office.

## 2011-10-14 NOTE — Telephone Encounter (Signed)
Fu call °Patient returning your call °

## 2011-10-14 NOTE — Telephone Encounter (Signed)
Left message to call back  

## 2011-10-15 ENCOUNTER — Encounter (HOSPITAL_COMMUNITY): Payer: Self-pay

## 2011-10-15 ENCOUNTER — Ambulatory Visit (HOSPITAL_COMMUNITY): Payer: Self-pay

## 2011-11-01 ENCOUNTER — Telehealth: Payer: Self-pay | Admitting: *Deleted

## 2011-11-01 NOTE — Telephone Encounter (Signed)
Effient received in office. Pt has been notified.

## 2011-11-01 NOTE — Telephone Encounter (Signed)
PT AWARE  RECEIVED EFFIENT WILL LEAVE AT FRONT DESK FOR PICK UP

## 2011-11-23 ENCOUNTER — Ambulatory Visit: Payer: Self-pay | Admitting: Cardiovascular Disease

## 2012-03-01 ENCOUNTER — Other Ambulatory Visit: Payer: Self-pay | Admitting: Cardiovascular Disease

## 2012-03-01 ENCOUNTER — Telehealth: Payer: Self-pay | Admitting: Cardiovascular Disease

## 2012-03-01 DIAGNOSIS — I2510 Atherosclerotic heart disease of native coronary artery without angina pectoris: Secondary | ICD-10-CM

## 2012-03-01 MED ORDER — METOPROLOL TARTRATE 50 MG PO TABS: 50.0000 mg | ORAL_TABLET | Freq: Two times a day (BID) | ORAL | Status: DC

## 2012-03-01 MED ORDER — LISINOPRIL 5 MG PO TABS: 5.0000 mg | ORAL_TABLET | Freq: Every day | ORAL | Status: DC

## 2012-03-01 NOTE — Telephone Encounter (Signed)
Pt calling re rx's being called into [facility], he wanted them called to [facility] [address]

## 2012-03-02 MED ORDER — SIMVASTATIN 40 MG PO TABS: 40.0000 mg | ORAL_TABLET | Freq: Every evening | ORAL | Status: DC

## 2012-03-02 MED ORDER — LISINOPRIL 5 MG PO TABS: 5.0000 mg | ORAL_TABLET | Freq: Every day | ORAL | Status: DC

## 2012-03-02 MED ORDER — PRASUGREL HCL 10 MG PO TABS: 10.0000 mg | ORAL_TABLET | Freq: Every day | ORAL | Status: DC

## 2012-03-02 MED ORDER — METOPROLOL TARTRATE 50 MG PO TABS: 50.0000 mg | ORAL_TABLET | Freq: Two times a day (BID) | ORAL | Status: DC

## 2012-06-19 ENCOUNTER — Telehealth: Payer: Self-pay | Admitting: Cardiovascular Disease

## 2012-06-19 NOTE — Telephone Encounter (Signed)
New problem:   Metoprolol  50 mg   Lisinopril  5 mg   Samples of effient .     [facility] [address]

## 2012-06-20 ENCOUNTER — Other Ambulatory Visit: Payer: Self-pay | Admitting: Cardiovascular Disease

## 2012-06-20 MED ORDER — LISINOPRIL 5 MG PO TABS: 5.0000 mg | ORAL_TABLET | Freq: Every day | ORAL | Status: DC

## 2012-06-20 NOTE — Telephone Encounter (Signed)
See refill note

## 2012-06-20 NOTE — Telephone Encounter (Signed)
Spoke with pt and gave him information below. He plans on being here for appt on June 26, 2012. Refills sent to [facility] for lopressor and lisinopril

## 2012-06-20 NOTE — Telephone Encounter (Signed)
Pt due for follow up appt with Dr. Clifton James and he has scheduled this for January 13,2014. He was in Effient assistance program but this expired at the end of December 2013. Will leave samples of Effient 10 mg at front desk for pt to pick up. (Effient 10 mg-7 tablets- exp 7/14. Lot [id number]). Will fill lopressor and lisinopril for 1 month supply.  I called pt to give him this information. Left message to call back

## 2012-06-20 NOTE — Telephone Encounter (Signed)
Follow-up:   Refill: Patient is calling in to follow-up on the call he placed yesterday about having his metoprolol (LOPRESSOR) 50 MG tablet and lisinopril (PRINIVIL,ZESTRIL) 5 MG tablet refilled at the pharmacy listed on file.   Pat: Patient called in wanting to know if there were any samples of prasugrel (EFFIENT) 10 MG TABS available for him to have.  Please call back.

## 2012-06-26 ENCOUNTER — Encounter: Payer: Self-pay | Admitting: Cardiovascular Disease

## 2012-06-26 ENCOUNTER — Ambulatory Visit (INDEPENDENT_AMBULATORY_CARE_PROVIDER_SITE_OTHER): Payer: Self-pay | Admitting: Cardiovascular Disease

## 2012-06-26 VITALS — BP 124/90 | HR 51 | Ht 68.0 in | Wt 209.0 lb

## 2012-06-26 DIAGNOSIS — I2510 Atherosclerotic heart disease of native coronary artery without angina pectoris: Secondary | ICD-10-CM

## 2012-06-26 MED ORDER — NITROGLYCERIN 0.4 MG SL SUBL: 0.4000 mg | SUBLINGUAL_TABLET | SUBLINGUAL | Status: DC | PRN

## 2012-06-26 MED ORDER — PRAVASTATIN SODIUM 40 MG PO TABS: 40.0000 mg | ORAL_TABLET | Freq: Every evening | ORAL | Status: DC

## 2012-06-26 NOTE — Progress Notes (Signed)
History of Present Illness: [name] with history of CAD with admission to [facility] 03/31/11 with anterior STEMI with occluded LAD now s/p DES x 1 proximal LAD, HTN, tobacco abuse here today for cardiac follow up. His cath was performed by Dr. Kirke Corin. He had a severe dye reaction. LV function was preserved by echo.   He is here today for follow up. He tells me that he has been doing well. . No chest pain, SOB. No palpitations, near syncope or syncope.   Primary Care Physician: None  Last Lipid Profile:Lipid Panel     Component Value Date/Time   CHOL 185 04/01/2011 0545   TRIG 66 04/01/2011 0545   HDL 62 04/01/2011 0545   CHOLHDL 3.0 04/01/2011 0545   VLDL 13 04/01/2011 0545   LDLCALC 110* 04/01/2011 0545     Past Medical History  Diagnosis Date  . Anterior myocardial infarction     ST-elevation; S/P emergent  drug-eluting stenting of proximal left anterior descending  . Coronary artery disease     Cath October 2012-moderate disease in RCA, Circumflex  . Renal insufficiency   . Hypertension   . Tobacco abuse   . Alcohol abuse     Past Surgical History  Procedure Date  . Doppler echocardiography     Preserved left venticular function  . Leg surgery     Allergies  Allergen Reactions  . Ivp Dye (Iodinated Diagnostic Agents)     History   Social History  . Marital Status: Single    Spouse Name: N/A    Number of Children: 0  . Years of Education: N/A   Occupational History  . Not on file.   Social History Main Topics  . Smoking status: Former Games developer  . Smokeless tobacco: Not on file  . Alcohol Use: No  . Drug Use: No  . Sexually Active: Not on file   Other Topics Concern  . Not on file   Social History Narrative  . No narrative on file    Family History  Problem Relation Age of Onset  . Hyperlipidemia    . Diabetes    . Heart disease      Current Outpatient Prescriptions  Medication Sig Dispense Refill  . aspirin EC 81 MG tablet Take 81 mg by  mouth daily.        Marland Kitchen lisinopril (PRINIVIL,ZESTRIL) 5 MG tablet Take 1 tablet (5 mg total) by mouth daily.  30 tablet  1  . metoprolol (LOPRESSOR) 50 MG tablet TAKE ONE TABLET BY MOUTH TWICE DAILY NEEDS OFFICE VISIT FOR FUTHER REFILLS  60 tablet  1  . nitroGLYCERIN (NITROSTAT) 0.4 MG SL tablet Place 1 tablet (0.4 mg total) under the tongue every 5 (five) minutes as needed.  25 tablet  6  . simvastatin (ZOCOR) 40 MG tablet Take 1 tablet (40 mg total) by mouth every evening.  30 tablet  11  . pravastatin (PRAVACHOL) 40 MG tablet Take 1 tablet (40 mg total) by mouth every evening.  30 tablet  11   Review of Systems:  As stated in the HPI and otherwise negative.   BP 124/90  Pulse 51  Ht 5\' 8"  (1.727 m)  Wt 209 lb (94.802 kg)  BMI 31.78 kg/m2  SpO2 98%  Physical Examination: General: Well developed, well nourished, NAD HEENT: OP clear, mucus membranes moist SKIN: warm, dry. No rashes. Neuro: No focal deficits Musculoskeletal: Muscle strength 5/5 all ext Psychiatric: Mood and affect normal Neck: No JVD, no  carotid bruits, no thyromegaly, no lymphadenopathy. Lungs:Clear bilaterally, no wheezes, rhonci, crackles Cardiovascular: Regular rate and rhythm. No murmurs, gallops or rubs. Abdomen:Soft. Bowel sounds present. Non-tender.  Extremities: No lower extremity edema. Pulses are 2 + in the bilateral DP/PT.  EKG: Sinus bradycardia, rate 51 bpm.   Assessment and Plan:   1. CAD: Stable post STEMI 03/31/11 with DES placed in LAD. Moderate disease in other vessels. Continue ASA, beta blocker, statin and Ace-inh. Will d/c Effient. Will change statin to Pravastatin 40 mg po QHS. Refill NTG SL prn. Follow up one year.

## 2012-06-26 NOTE — Patient Instructions (Addendum)
Your physician wants you to follow-up in:12 months.   You will receive a reminder letter in the mail two months in advance. If you don't receive a letter, please call our office to schedule the follow-up appointment.  Your physician has recommended you make the following change in your medication:  Stop Effient.   Start Pravastatin 40 mg by mouth daily at bedtime.  Contact Urgent Care at [facility] to set up appt for primary [phone]

## 2012-09-01 ENCOUNTER — Other Ambulatory Visit: Payer: Self-pay | Admitting: *Deleted

## 2012-09-01 ENCOUNTER — Other Ambulatory Visit: Payer: Self-pay | Admitting: Cardiovascular Disease

## 2012-09-01 MED ORDER — METOPROLOL TARTRATE 50 MG PO TABS: 50.0000 mg | ORAL_TABLET | Freq: Two times a day (BID) | ORAL | Status: DC

## 2012-09-01 MED ORDER — LISINOPRIL 5 MG PO TABS: 5.0000 mg | ORAL_TABLET | Freq: Every day | ORAL | Status: DC

## 2013-03-19 ENCOUNTER — Other Ambulatory Visit: Payer: Self-pay | Admitting: *Deleted

## 2013-03-19 MED ORDER — LISINOPRIL 5 MG PO TABS: 5.0000 mg | ORAL_TABLET | Freq: Every day | ORAL | Status: DC

## 2013-03-19 MED ORDER — METOPROLOL TARTRATE 50 MG PO TABS: 50.0000 mg | ORAL_TABLET | Freq: Two times a day (BID) | ORAL | Status: DC

## 2013-04-04 ENCOUNTER — Ambulatory Visit (INDEPENDENT_AMBULATORY_CARE_PROVIDER_SITE_OTHER): Payer: Self-pay | Admitting: Cardiovascular Disease

## 2013-04-04 ENCOUNTER — Encounter: Payer: Self-pay | Admitting: Cardiovascular Disease

## 2013-04-04 VITALS — BP 170/90 | HR 60 | Resp 12

## 2013-04-04 DIAGNOSIS — I2510 Atherosclerotic heart disease of native coronary artery without angina pectoris: Secondary | ICD-10-CM

## 2013-04-04 DIAGNOSIS — I10 Essential (primary) hypertension: Secondary | ICD-10-CM

## 2013-04-04 MED ORDER — LISINOPRIL 10 MG PO TABS: 10.0000 mg | ORAL_TABLET | Freq: Every day | ORAL | Status: DC

## 2013-04-04 NOTE — Progress Notes (Signed)
History of Present Illness: 52 yo [name] with history of CAD, HTN here today for cardiac follow up. He was admitted to [facility] 03/31/11 with anterior STEMI with occluded LAD now s/p DES x 1 proximal LAD. His cath was performed by Dr. Kirke Corin. He had a severe dye reaction. LV function was preserved by echo.   He is here today for follow up. He tells me that he has been doing well but his blood pressure has been high. It has been running 160-170 systolic at home. No chest pain or SOB. No palpitations, near syncope or syncope. He has had some pains radiating from his upper back to his arms.   Primary Care Physician: None  Last Lipid Profile:Lipid Panel     Component Value Date/Time   CHOL 185 04/01/2011 0545   TRIG 66 04/01/2011 0545   HDL 62 04/01/2011 0545   CHOLHDL 3.0 04/01/2011 0545   VLDL 13 04/01/2011 0545   LDLCALC 110* 04/01/2011 0545     Past Medical History  Diagnosis Date  . Anterior myocardial infarction     ST-elevation; S/P emergent  drug-eluting stenting of proximal left anterior descending  . Coronary artery disease     Cath October 2012-moderate disease in RCA, Circumflex  . Renal insufficiency   . Hypertension   . Tobacco abuse   . Alcohol abuse     Past Surgical History  Procedure Laterality Date  . Doppler echocardiography      Preserved left venticular function  . Leg surgery      Allergies  Allergen Reactions  . Ivp Dye [Iodinated Diagnostic Agents]     History   Social History  . Marital Status: Single    Spouse Name: N/A    Number of Children: 0  . Years of Education: N/A   Occupational History  . Not on file.   Social History Main Topics  . Smoking status: Former Games developer  . Smokeless tobacco: Not on file  . Alcohol Use: No  . Drug Use: No  . Sexual Activity: Not on file   Other Topics Concern  . Not on file   Social History Narrative  . No narrative on file    Family History  Problem Relation Age of Onset  . Hyperlipidemia    .  Diabetes    . Heart disease      Current Outpatient Prescriptions  Medication Sig Dispense Refill  . aspirin EC 81 MG tablet Take 81 mg by mouth daily.        Marland Kitchen lisinopril (PRINIVIL,ZESTRIL) 5 MG tablet Take 1 tablet (5 mg total) by mouth daily.  30 tablet  1  . metoprolol (LOPRESSOR) 50 MG tablet TAKE ONE TABLET BY MOUTH TWICE DAILY NEEDS OFFICE VISIT FOR FUTHER REFILLS  60 tablet  1  . nitroGLYCERIN (NITROSTAT) 0.4 MG SL tablet Place 1 tablet (0.4 mg total) under the tongue every 5 (five) minutes as needed.  25 tablet  6  .      . pravastatin (PRAVACHOL) Michael MG tablet Take 1 tablet (Michael mg total) by mouth every evening.  30 tablet  11   Review of Systems:  As stated in the HPI and otherwise negative.   BP 170/90  Pulse 60  Resp 12  Physical Examination: General: Well developed, well nourished, NAD HEENT: OP clear, mucus membranes moist SKIN: warm, dry. No rashes. Neuro: No focal deficits Musculoskeletal: Muscle strength 5/5 all ext Psychiatric: Mood and affect normal Neck: No JVD, no carotid bruits,  no thyromegaly, no lymphadenopathy. Lungs:Clear bilaterally, no wheezes, rhonci, crackles Cardiovascular: Regular rate and rhythm. No murmurs, gallops or rubs. Abdomen:Soft. Bowel sounds present. Non-tender.  Extremities: No lower extremity edema. Pulses are 2 + in the bilateral DP/PT.  EKG: Sinus bradycardia, rate 57 bpm.    Assessment and Plan:   1. CAD: Stable post STEMI 03/31/11 with DES placed in LAD. Moderate disease in other vessels. Continue ASA, beta blocker, statin and Ace-inh. His neck pains do not sound cardiac. BP has been elevated. Will increase Lisinopril to 10 mg po Qdaily. He will call back with his BP readings next week. I will see him back in 3-4 weeks. If he is still having arm pains, we will discuss stress testing.   2. HTN: BP elevated. Will increase Lisinopril.

## 2013-04-04 NOTE — Patient Instructions (Signed)
Your physician recommends that you schedule a follow-up appointment in:  3-4 weeks with Dr. Clifton James  Your physician has recommended you make the following change in your medication:  Increase Lisinopril to 10 mg by mouth daily

## 2013-04-25 ENCOUNTER — Encounter (HOSPITAL_COMMUNITY): Payer: Self-pay | Admitting: Emergency Medicine

## 2013-04-25 ENCOUNTER — Emergency Department (HOSPITAL_COMMUNITY)
Admission: EM | Admit: 2013-04-25 | Discharge: 2013-04-26 | Disposition: A | Discharge status: Home / Self Care | Payer: Self-pay | Attending: Emergency Medicine | Admitting: Emergency Medicine

## 2013-04-25 DIAGNOSIS — Z79899 Other long term (current) drug therapy: Secondary | ICD-10-CM | POA: Insufficient documentation

## 2013-04-25 DIAGNOSIS — I252 Old myocardial infarction: Secondary | ICD-10-CM | POA: Insufficient documentation

## 2013-04-25 DIAGNOSIS — I10 Essential (primary) hypertension: Secondary | ICD-10-CM | POA: Insufficient documentation

## 2013-04-25 DIAGNOSIS — Z87448 Personal history of other diseases of urinary system: Secondary | ICD-10-CM | POA: Insufficient documentation

## 2013-04-25 DIAGNOSIS — F1021 Alcohol dependence, in remission: Secondary | ICD-10-CM | POA: Insufficient documentation

## 2013-04-25 DIAGNOSIS — Z87891 Personal history of nicotine dependence: Secondary | ICD-10-CM | POA: Insufficient documentation

## 2013-04-25 DIAGNOSIS — K047 Periapical abscess without sinus: Secondary | ICD-10-CM | POA: Diagnosis present

## 2013-04-25 DIAGNOSIS — I2510 Atherosclerotic heart disease of native coronary artery without angina pectoris: Secondary | ICD-10-CM | POA: Insufficient documentation

## 2013-04-25 DIAGNOSIS — Z7982 Long term (current) use of aspirin: Secondary | ICD-10-CM | POA: Insufficient documentation

## 2013-04-25 DIAGNOSIS — R300 Dysuria: Secondary | ICD-10-CM | POA: Diagnosis present

## 2013-04-25 LAB — URINALYSIS, ROUTINE W REFLEX MICROSCOPIC
Bilirubin Urine: NEGATIVE
Hgb urine dipstick: NEGATIVE
Ketones, ur: NEGATIVE mg/dL
Nitrite: NEGATIVE
Protein, ur: NEGATIVE mg/dL
Urobilinogen, UA: 1 mg/dL (ref 0.0–1.0)

## 2013-04-25 MED ORDER — OXYCODONE-ACETAMINOPHEN 5-325 MG PO TABS
1.0000 | ORAL_TABLET | Freq: Once | ORAL | Status: AC
  Administered 2013-04-26: 1 via ORAL
  Filled 2013-04-25: qty 1

## 2013-04-25 MED ORDER — PENICILLIN V POTASSIUM 250 MG PO TABS
500.0000 mg | ORAL_TABLET | Freq: Once | ORAL | Status: AC
  Administered 2013-04-26: 500 mg via ORAL
  Filled 2013-04-25: qty 2

## 2013-04-25 NOTE — ED Notes (Signed)
Pt c/o right sided dental pain with swelling and dysuria ; pt sts dental pain x 3 days and dysuria x 1 week

## 2013-04-25 NOTE — ED Notes (Signed)
Suture cart placed outside of pt's room.

## 2013-04-25 NOTE — ED Notes (Signed)
Harrison, MD at bedside. 

## 2013-04-25 NOTE — ED Notes (Signed)
Pt states that he has been having pain in swelling in his right jaw x 2 days. Pt states that he has not seen a dentist, nor does he have one.

## 2013-04-25 NOTE — ED Provider Notes (Signed)
CSN: [id number]     Arrival date & time 04/25/13  1655 History   First MD Initiated Contact with Patient 04/25/13 2206     Chief Complaint  Patient presents with  . Dental Pain  . Dysuria   (Consider location/radiation/quality/duration/timing/severity/associated sxs/prior Treatment) Patient is a 52 y.o. male presenting with tooth pain and dysuria. The history is provided by the patient.  Dental Pain Location:  Lower Lower teeth location:  30/RL 1st molar Quality:  Dull and aching Severity:  Moderate Onset quality:  Gradual Duration:  2 days Timing:  Constant Progression:  Unchanged Chronicity:  New Context: abscess   Relieved by:  Nothing Worsened by:  Nothing tried Ineffective treatments:  None tried Associated symptoms: no drooling, no fever, no headaches and no neck pain   Dysuria Pertinent negatives include no chest pain, no abdominal pain, no headaches and no shortness of breath.    Past Medical History  Diagnosis Date  . Anterior myocardial infarction     ST-elevation; S/P emergent  drug-eluting stenting of proximal left anterior descending  . Coronary artery disease     Cath October 2012-moderate disease in RCA, Circumflex  . Renal insufficiency   . Hypertension   . Tobacco abuse   . Alcohol abuse    Past Surgical History  Procedure Laterality Date  . Doppler echocardiography      Preserved left venticular function  . Leg surgery     Family History  Problem Relation Age of Onset  . Hyperlipidemia    . Diabetes    . Heart disease     History  Substance Use Topics  . Smoking status: Former Games developer  . Smokeless tobacco: Not on file  . Alcohol Use: Yes    Review of Systems  Constitutional: Negative for fever.  HENT: Negative for drooling and rhinorrhea.   Eyes: Negative for pain.  Respiratory: Negative for cough and shortness of breath.   Cardiovascular: Negative for chest pain and leg swelling.  Gastrointestinal: Negative for nausea, vomiting,  abdominal pain and diarrhea.  Genitourinary: Positive for dysuria. Negative for hematuria.  Musculoskeletal: Negative for gait problem and neck pain.  Skin: Negative for color change.  Neurological: Negative for numbness and headaches.  Hematological: Negative for adenopathy.  Psychiatric/Behavioral: Negative for behavioral problems.  All other systems reviewed and are negative.    Allergies  Ivp dye  Home Medications   Current Outpatient Rx  Name  Route  Sig  Dispense  Refill  . aspirin EC 81 MG tablet   Oral   Take 81 mg by mouth daily.           Marland Kitchen lisinopril (PRINIVIL,ZESTRIL) 10 MG tablet   Oral   Take 1 tablet (10 mg total) by mouth daily.   30 tablet   6   . metoprolol (LOPRESSOR) 50 MG tablet   Oral   Take 1 tablet (50 mg total) by mouth 2 (two) times daily.   60 tablet   2   . nitroGLYCERIN (NITROSTAT) 0.4 MG SL tablet   Sublingual   Place 1 tablet (0.4 mg total) under the tongue every 5 (five) minutes as needed.   25 tablet   6   . pravastatin (PRAVACHOL) 40 MG tablet   Oral   Take 1 tablet (40 mg total) by mouth every evening.   30 tablet   11   . EXPIRED: simvastatin (ZOCOR) 40 MG tablet   Oral   Take 1 tablet (40 mg total) by mouth every  evening.   30 tablet   11    BP 130/77  Pulse 50  Temp(Src) 98 F (36.7 C) (Oral)  Resp 20  Ht 5\' 8"  (1.727 m)  Wt 219 lb 14.4 oz (99.746 kg)  BMI 33.44 kg/m2  SpO2 98% Physical Exam  Nursing note and vitals reviewed. Constitutional: He is oriented to person, place, and time. He appears well-developed and well-nourished.  HENT:  Head: Normocephalic and atraumatic.  Right Ear: External ear normal.  Left Ear: External ear normal.  Nose: Nose normal.  Mouth/Throat: Oropharynx is clear and moist. No oropharyngeal exudate.  Mild to moderated swelling at right mid mandible.   Fluctuant swelling noted lateral to the right lower 1st molar.   Normal appearing posterior oropharynx.   Eyes: Conjunctivae  and EOM are normal. Pupils are equal, round, and reactive to light.  Neck: Normal range of motion. Neck supple.  Cardiovascular: Normal rate, regular rhythm, normal heart sounds and intact distal pulses.  Exam reveals no gallop and no friction rub.   No murmur heard. Pulmonary/Chest: Effort normal and breath sounds normal. No respiratory distress. He has no wheezes.  Abdominal: Soft. Bowel sounds are normal. He exhibits no distension. There is no tenderness. There is no rebound and no guarding.  Musculoskeletal: Normal range of motion. He exhibits no edema and no tenderness.  Neurological: He is alert and oriented to person, place, and time.  Skin: Skin is warm and dry.  Psychiatric: He has a normal mood and affect. His behavior is normal.    ED Course  INCISION AND DRAINAGE Date/Time: 04/26/2013 1:01 PM Performed by: Purvis Sheffield, S Authorized by: Purvis Sheffield, S Consent: Verbal consent obtained. written consent not obtained. Risks and benefits: risks, benefits and alternatives were discussed Consent given by: patient Patient understanding: patient states understanding of the procedure being performed Required items: required blood products, implants, devices, and special equipment available Patient identity confirmed: verbally with patient, arm band, provided demographic data and hospital-assigned identification number Time out: Immediately prior to procedure a "time out" was called to verify the correct patient, procedure, equipment, support staff and site/side marked as required. Type: abscess Body area: mouth (Periodontal, lateral to right lower 1st molar) Anesthesia: local infiltration Local anesthetic: lidocaine 1% with epinephrine Anesthetic total: 2 ml Patient sedated: no Scalpel size: 11 Incision type: single straight Complexity: simple Drainage: purulent Drainage amount: moderate Wound treatment: wound left open Patient tolerance: Patient tolerated the  procedure well with no immediate complications.   (including critical care time) Labs Review Labs Reviewed  URINALYSIS, ROUTINE W REFLEX MICROSCOPIC   Imaging Review No results found.  EKG Interpretation   None       MDM   1. Dental abscess   2. Dysuria    10:29 PM 52 y.o. male who pw dental pain for 2 days. Denies fevers. Has also had occasional dysuria for the last month. He is afebrile and vital signs are unremarkable here. He has evidence of a periodontal abscess on exam which I will incise and drain.  12:23 AM: I incised and drained abscess w/out complication. Will place on abx, pain medicine. Will rec dental follow up. UA frankly normal here today, will have pt monitor his sx as they are intermittent.  I have discussed the diagnosis/risks/treatment options with the patient and believe the pt to be eligible for discharge home to follow-up with pcp as needed. We also discussed returning to the ED immediately if new or worsening sx occur. We discussed the  sx which are most concerning (e.g., inc swelling, fever, worsening pain, bleeding) that necessitate immediate return. Any new prescriptions provided to the patient are listed below.  New Prescriptions   OXYCODONE-ACETAMINOPHEN (PERCOCET) 5-325 MG PER TABLET    Take 1 tablet by mouth every 4 (four) hours as needed.   PENICILLIN V POTASSIUM (VEETID) 500 MG TABLET    Take 1 tablet (500 mg total) by mouth 4 (four) times daily.     Junius Argyle, MD 04/26/13 [phone]

## 2013-04-26 DIAGNOSIS — K047 Periapical abscess without sinus: Secondary | ICD-10-CM | POA: Diagnosis present

## 2013-04-26 DIAGNOSIS — R300 Dysuria: Secondary | ICD-10-CM | POA: Diagnosis present

## 2013-04-26 HISTORY — DX: Dysuria: R30.0

## 2013-04-26 MED ORDER — OXYCODONE-ACETAMINOPHEN 5-325 MG PO TABS: 1.0000 | ORAL_TABLET | ORAL | Status: DC | PRN

## 2013-04-26 MED ORDER — PENICILLIN V POTASSIUM 500 MG PO TABS: 500.0000 mg | ORAL_TABLET | Freq: Four times a day (QID) | ORAL | Status: AC

## 2013-05-01 ENCOUNTER — Ambulatory Visit: Payer: Self-pay

## 2013-05-04 ENCOUNTER — Ambulatory Visit (INDEPENDENT_AMBULATORY_CARE_PROVIDER_SITE_OTHER): Payer: Self-pay | Admitting: Cardiovascular Disease

## 2013-05-04 ENCOUNTER — Encounter: Payer: Self-pay | Admitting: Cardiovascular Disease

## 2013-05-04 VITALS — BP 154/94 | HR 68 | Ht 68.0 in | Wt 221.0 lb

## 2013-05-04 DIAGNOSIS — I2510 Atherosclerotic heart disease of native coronary artery without angina pectoris: Secondary | ICD-10-CM

## 2013-05-04 DIAGNOSIS — I10 Essential (primary) hypertension: Secondary | ICD-10-CM

## 2013-05-04 LAB — LIPID PANEL
Total CHOL/HDL Ratio: 6
Triglycerides: 119 mg/dL (ref 0.0–149.0)

## 2013-05-04 LAB — BASIC METABOLIC PANEL
BUN: 16 mg/dL (ref 6–23)
CO2: 28 mEq/L (ref 19–32)
Calcium: 8.9 mg/dL (ref 8.4–10.5)
Glucose, Bld: 89 mg/dL (ref 70–99)
Sodium: 135 mEq/L (ref 135–145)

## 2013-05-04 LAB — LDL CHOLESTEROL, DIRECT: Direct LDL: 164.1 mg/dL

## 2013-05-04 LAB — HEPATIC FUNCTION PANEL
ALT: 20 U/L (ref 0–53)
AST: 16 U/L (ref 0–37)
Alkaline Phosphatase: 61 U/L (ref 39–117)
Bilirubin, Direct: 0 mg/dL (ref 0.0–0.3)
Total Bilirubin: 0.4 mg/dL (ref 0.3–1.2)

## 2013-05-04 MED ORDER — LISINOPRIL 20 MG PO TABS: 20.0000 mg | ORAL_TABLET | Freq: Every day | ORAL | Status: DC

## 2013-05-04 NOTE — Patient Instructions (Signed)
Your physician wants you to follow-up in:  6 months. You will receive a reminder letter in the mail two months in advance. If you don't receive a letter, please call our office to schedule the follow-up appointment.  Your physician has recommended you make the following change in your medication: Increase Lisinopril to 20 mg by mouth daily   

## 2013-05-04 NOTE — Progress Notes (Signed)
History of Present Illness: [name] with history of CAD, HTN here today for cardiac follow up. He was admitted to [facility] 03/31/11 with anterior STEMI with occluded LAD now s/p DES x 1 proximal LAD. His cath was performed by Dr. Kirke Corin. He had a severe dye reaction. LV function was preserved by echo. Recently seen 04/04/13 and had c/o neck pain which was not felt to be cardiac. BP was elevated. Lisinopril was increased.   He is here today for follow up. He tells me that he has been doing well. Neck pain is better. No chest pain. BP is 150s at home. No SOB, palpitations, near syncope or syncope. Recent abscess face has been treated.   Primary Care Physician: None  Last Lipid Profile:Lipid Panel     Component Value Date/Time   CHOL 185 04/01/2011 0545   TRIG 66 04/01/2011 0545   HDL 62 04/01/2011 0545   CHOLHDL 3.0 04/01/2011 0545   VLDL 13 04/01/2011 0545   LDLCALC 110* 04/01/2011 0545     Past Medical History  Diagnosis Date  . Anterior myocardial infarction     ST-elevation; S/P emergent  drug-eluting stenting of proximal left anterior descending  . Coronary artery disease     Cath October 2012-moderate disease in RCA, Circumflex  . Renal insufficiency   . Hypertension   . Tobacco abuse   . Alcohol abuse     Past Surgical History  Procedure Laterality Date  . Doppler echocardiography      Preserved left venticular function  . Leg surgery      Allergies  Allergen Reactions  . Ivp Dye [Iodinated Diagnostic Agents] Swelling    History   Social History  . Marital Status: Single    Spouse Name: N/A    Number of Children: 0  . Years of Education: N/A   Occupational History  . Not on file.   Social History Main Topics  . Smoking status: Former Games developer  . Smokeless tobacco: Not on file  . Alcohol Use: Yes  . Drug Use: No  . Sexual Activity: Not on file   Other Topics Concern  . Not on file   Social History Narrative  . No narrative on file    Family  History  Problem Relation Age of Onset  . Hyperlipidemia    . Diabetes    . Heart disease     Meds:  Current outpatient prescriptions: aspirin EC 81 MG tablet, Take 81 mg by mouth daily.  , Disp: , Rfl: ;   ibuprofen (ADVIL,MOTRIN) 200 MG tablet, Take 600 mg by mouth every 6 (six) hours as needed for moderate pain., Disp: , Rfl: ;   lisinopril (PRINIVIL,ZESTRIL) 10 MG tablet, Take 1 tablet (10 mg total) by mouth daily., Disp: 30 tablet, Rfl: 6 metoprolol (LOPRESSOR) 50 MG tablet, Take 1 tablet (50 mg total) by mouth 2 (two) times daily., Disp: 60 tablet, Rfl: 2;  nitroGLYCERIN (NITROSTAT) 0.4 MG SL tablet, Place 1 tablet (0.4 mg total) under the tongue every 5 (five) minutes as needed., Disp: 25 tablet, Rfl: 6;  NON FORMULARY, PENICILLIN AS DIRECTED, Disp: , Rfl:  oxyCODONE-acetaminophen (PERCOCET) 5-325 MG per tablet, Take 1 tablet by mouth every 4 (four) hours as needed., Disp: 10 tablet, Rfl: 0;  pravastatin (PRAVACHOL) 40 MG tablet, Take 1 tablet (40 mg total) by mouth every evening., Disp: 30 tablet, Rfl: 11  Review of Systems:  As stated in the HPI and otherwise negative.   BP 154/94  Pulse 68  Ht 5\' 8"  (1.727 m)  Wt 221 lb (100.245 kg)  BMI 33.61 kg/m2  Physical Examination: General: Well developed, well nourished, NAD HEENT: OP clear, mucus membranes moist SKIN: warm, dry. No rashes. Neuro: No focal deficits Musculoskeletal: Muscle strength 5/5 all ext Psychiatric: Mood and affect normal Neck: No JVD, no carotid bruits, no thyromegaly, no lymphadenopathy. Lungs:Clear bilaterally, no wheezes, rhonci, crackles Cardiovascular: Regular rate and rhythm. No murmurs, gallops or rubs. Abdomen:Soft. Bowel sounds present. Non-tender.  Extremities: No lower extremity edema. Pulses are 2 + in the bilateral DP/PT.  Assessment and Plan:   1. CAD: Stable post STEMI 03/31/11 with DES placed in LAD. Moderate disease in other vessels. Continue ASA, beta blocker, statin and Ace-inh.  Lipids, LFTs, BMET today.   2. HTN: BP still elevated. Will increase Lisinopril to 20 mg po Qdaily.

## 2013-05-07 ENCOUNTER — Telehealth: Payer: Self-pay | Admitting: Cardiovascular Disease

## 2013-05-07 DIAGNOSIS — I2510 Atherosclerotic heart disease of native coronary artery without angina pectoris: Secondary | ICD-10-CM

## 2013-05-07 MED ORDER — PRAVASTATIN SODIUM 80 MG PO TABS: 80.0000 mg | ORAL_TABLET | Freq: Every evening | ORAL | Status: DC

## 2013-05-07 NOTE — Telephone Encounter (Signed)
Spoke with pt and reviewed lab results with him and recommendations from Dr. Clifton James. He has not been taking Pravastatin every day due to cost.  He will try to take increased dose daily and let us know if he is unable to afford.

## 2013-05-07 NOTE — Telephone Encounter (Signed)
Patient is returning your call, please call patient back.

## 2013-05-18 ENCOUNTER — Ambulatory Visit: Payer: Self-pay | Attending: Internal Medicine | Admitting: Internal Medicine

## 2013-05-18 VITALS — BP 134/91 | HR 57 | Temp 98.9°F | Resp 14 | Ht 68.0 in | Wt 220.4 lb

## 2013-05-18 DIAGNOSIS — G8929 Other chronic pain: Secondary | ICD-10-CM | POA: Insufficient documentation

## 2013-05-18 DIAGNOSIS — E119 Type 2 diabetes mellitus without complications: Secondary | ICD-10-CM

## 2013-05-18 DIAGNOSIS — M549 Dorsalgia, unspecified: Secondary | ICD-10-CM | POA: Insufficient documentation

## 2013-05-18 LAB — TSH: TSH: 0.885 u[IU]/mL (ref 0.350–4.500)

## 2013-05-18 MED ORDER — CYCLOBENZAPRINE HCL 5 MG PO TABS: 5.0000 mg | ORAL_TABLET | Freq: Three times a day (TID) | ORAL | Status: DC | PRN

## 2013-05-18 MED ORDER — TRAMADOL HCL 50 MG PO TABS: 50.0000 mg | ORAL_TABLET | Freq: Three times a day (TID) | ORAL | Status: DC | PRN

## 2013-05-18 NOTE — Progress Notes (Signed)
Pt is here to establish care. Pt complains of constant lower back pain x2 years and throbbing headaches on Rt backside; numbness in Lt arm lasting 10-15 mins. Pt requests HBA1c.
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
## 2013-05-19 LAB — VITAMIN D 25 HYDROXY (VIT D DEFICIENCY, FRACTURES): Vit D, 25-Hydroxy: 27 ng/mL — ABNORMAL LOW (ref 30–89)

## 2013-05-25 ENCOUNTER — Telehealth: Payer: Self-pay | Admitting: *Deleted

## 2013-05-25 NOTE — Telephone Encounter (Signed)
Contacted pt to inform him that his thyroid function was normal and his vitamin D level was borderline low at 27. Patient can start taking over-the-counter 2000 international units of vitamin D daily.

## 2013-05-28 ENCOUNTER — Ambulatory Visit: Payer: Self-pay | Attending: Internal Medicine

## 2013-05-28 DIAGNOSIS — IMO0001 Reserved for inherently not codable concepts without codable children: Secondary | ICD-10-CM | POA: Insufficient documentation

## 2013-05-28 DIAGNOSIS — M545 Low back pain, unspecified: Secondary | ICD-10-CM | POA: Insufficient documentation

## 2013-05-28 DIAGNOSIS — R293 Abnormal posture: Secondary | ICD-10-CM | POA: Insufficient documentation

## 2013-06-04 ENCOUNTER — Ambulatory Visit: Payer: Self-pay | Admitting: Rehabilitation

## 2013-06-06 ENCOUNTER — Ambulatory Visit: Payer: Self-pay

## 2013-06-11 ENCOUNTER — Ambulatory Visit: Payer: Self-pay | Attending: Internal Medicine | Admitting: Internal Medicine

## 2013-06-11 ENCOUNTER — Encounter: Payer: Self-pay | Admitting: Internal Medicine

## 2013-06-11 ENCOUNTER — Ambulatory Visit: Payer: Self-pay | Admitting: Physical Therapy

## 2013-06-11 VITALS — BP 150/90 | HR 81 | Temp 98.8°F | Resp 18 | Ht 68.0 in | Wt 223.2 lb

## 2013-06-11 DIAGNOSIS — K089 Disorder of teeth and supporting structures, unspecified: Secondary | ICD-10-CM

## 2013-06-11 DIAGNOSIS — I10 Essential (primary) hypertension: Secondary | ICD-10-CM

## 2013-06-11 DIAGNOSIS — M549 Dorsalgia, unspecified: Secondary | ICD-10-CM

## 2013-06-11 DIAGNOSIS — IMO0001 Reserved for inherently not codable concepts without codable children: Secondary | ICD-10-CM

## 2013-06-11 DIAGNOSIS — K0889 Other specified disorders of teeth and supporting structures: Secondary | ICD-10-CM

## 2013-06-11 DIAGNOSIS — K029 Dental caries, unspecified: Secondary | ICD-10-CM

## 2013-06-11 MED ORDER — IBUPROFEN 600 MG PO TABS: 600.0000 mg | ORAL_TABLET | Freq: Four times a day (QID) | ORAL | Status: DC | PRN

## 2013-06-11 MED ORDER — PENICILLIN V POTASSIUM 250 MG PO TABS: 250.0000 mg | ORAL_TABLET | Freq: Four times a day (QID) | ORAL | Status: DC

## 2013-06-11 NOTE — Patient Instructions (Signed)
2 Gram Low Sodium Diet A 2 gram sodium diet restricts the amount of sodium in the diet to no more than 2 g or 2000 mg daily. Limiting the amount of sodium is often used to help lower blood pressure. It is important if you have heart, liver, or kidney problems. Many foods contain sodium for flavor and sometimes as a preservative. When the amount of sodium in a diet needs to be low, it is important to know what to look for when choosing foods and drinks. The following includes some information and guidelines to help make it easier for you to adapt to a low sodium diet. QUICK TIPS  Do not add salt to food.  Avoid convenience items and fast food.  Choose unsalted snack foods.  Buy lower sodium products, often labeled as "lower sodium" or "no salt added."  Check food labels to learn how much sodium is in 1 serving.  When eating at a restaurant, ask that your food be prepared with less salt or none, if possible. READING FOOD LABELS FOR SODIUM INFORMATION The nutrition facts label is a good place to find how much sodium is in foods. Look for products with no more than 500 to 600 mg of sodium per meal and no more than 150 mg per serving. Remember that 2 g = 2000 mg. The food label may also list foods as:  Sodium-free: Less than 5 mg in a serving.  Very low sodium: 35 mg or less in a serving.  Low-sodium: 140 mg or less in a serving.  Light in sodium: 50% less sodium in a serving. For example, if a food that usually has 300 mg of sodium is changed to become light in sodium, it will have 150 mg of sodium.  Reduced sodium: 25% less sodium in a serving. For example, if a food that usually has 400 mg of sodium is changed to reduced sodium, it will have 300 mg of sodium. CHOOSING FOODS Grains  Avoid: Salted crackers and snack items. Some cereals, including instant hot cereals. Bread stuffing and biscuit mixes. Seasoned rice or pasta mixes.  Choose: Unsalted snack items. Low-sodium cereals, oats,  puffed wheat and rice, shredded wheat. English muffins and bread. Pasta. Meats  Avoid: Salted, canned, smoked, spiced, pickled meats, including fish and poultry. Bacon, ham, sausage, cold cuts, hot dogs, anchovies.  Choose: Low-sodium canned tuna and salmon. Fresh or frozen meat, poultry, and fish. Dairy  Avoid: Processed cheese and spreads. Cottage cheese. Buttermilk and condensed milk. Regular cheese.  Choose: Milk. Low-sodium cottage cheese. Yogurt. Sour cream. Low-sodium cheese. Fruits and Vegetables  Avoid: Regular canned vegetables. Regular canned tomato sauce and paste. Frozen vegetables in sauces. Olives. Pickles. Relishes. Sauerkraut.  Choose: Low-sodium canned vegetables. Low-sodium tomato sauce and paste. Frozen or fresh vegetables. Fresh and frozen fruit. Condiments  Avoid: Canned and packaged gravies. Worcestershire sauce. Tartar sauce. Barbecue sauce. Soy sauce. Steak sauce. Ketchup. Onion, garlic, and table salt. Meat flavorings and tenderizers.  Choose: Fresh and dried herbs and spices. Low-sodium varieties of mustard and ketchup. Lemon juice. Tabasco sauce. Horseradish. SAMPLE 2 GRAM SODIUM MEAL PLAN Breakfast / Sodium (mg)  1 cup low-fat milk / 143 mg  2 slices whole-wheat toast / 270 mg  1 tbs heart-healthy margarine / 153 mg  1 hard-boiled egg / 139 mg  1 small orange / 0 mg Lunch / Sodium (mg)  1 cup raw carrots / 76 mg   cup hummus / 298 mg  1 cup low-fat milk /   143 mg   cup red grapes / 2 mg  1 whole-wheat pita bread / 356 mg Dinner / Sodium (mg)  1 cup whole-wheat pasta / 2 mg  1 cup low-sodium tomato sauce / 73 mg  3 oz lean ground beef / 57 mg  1 small side salad (1 cup raw spinach leaves,  cup cucumber,  cup yellow bell pepper) with 1 tsp olive oil and 1 tsp red wine vinegar / 25 mg Snack / Sodium (mg)  1 container low-fat vanilla yogurt / 107 mg  3 graham cracker squares / 127 mg Nutrient Analysis  Calories: 2033  Protein:  77 g  Carbohydrate: 282 g  Fat: 72 g  Sodium: 1971 mg Document Released: 05/31/2005 Document Revised: 08/23/2011 Document Reviewed: 09/01/2009 [employer] Patient Information 2014 [employer].  

## 2013-06-11 NOTE — Progress Notes (Signed)
MRN: [id number] Name: Michael [name]  Sex: male Age: 52 y.o. DOB: September 02, 1960  Allergies: Ivp dye  Chief Complaint  Patient presents with  . Follow-up    HPI: Patient is 52 y.o. male who comes today for followup, history of chronic back pain following up her with physical therapy as some improvement in the pain, also patient has history of dental pain and abscess had been to the ER last month, now patient is complaining of left upper jaw pain denies any fever chills, has history of hypertension and CAD following up with her cardiologist to his blood pressure is elevated denies any headache dizziness chest pain or shortness of breath as per patient he just took his blood pressure medication 30 minutes ago.  Past Medical History  Diagnosis Date  . Anterior myocardial infarction     ST-elevation; S/P emergent  drug-eluting stenting of proximal left anterior descending  . Coronary artery disease     Cath October 2012-moderate disease in RCA, Circumflex  . Renal insufficiency   . Hypertension   . Tobacco abuse   . Alcohol abuse     Past Surgical History  Procedure Laterality Date  . Doppler echocardiography      Preserved left venticular function  . Leg surgery        Medication List       This list is accurate as of: 06/11/13 11:24 AM.  Always use your most recent med list.               aspirin EC 81 MG tablet  Take 81 mg by mouth daily.     cyclobenzaprine 5 MG tablet  Commonly known as:  FLEXERIL  Take 1 tablet (5 mg total) by mouth 3 (three) times daily as needed for muscle spasms.     ibuprofen 600 MG tablet  Commonly known as:  ADVIL,MOTRIN  Take 1 tablet (600 mg total) by mouth every 6 (six) hours as needed for moderate pain.     lisinopril 20 MG tablet  Commonly known as:  PRINIVIL,ZESTRIL  Take 1 tablet (20 mg total) by mouth daily.     metoprolol 50 MG tablet  Commonly known as:  LOPRESSOR  Take 1 tablet (50 mg total) by mouth 2 (two) times daily.      nitroGLYCERIN 0.4 MG SL tablet  Commonly known as:  NITROSTAT  Place 1 tablet (0.4 mg total) under the tongue every 5 (five) minutes as needed.     NON FORMULARY  - PENICILLIN  - AS DIRECTED     penicillin v potassium 250 MG tablet  Commonly known as:  VEETID  Take 1 tablet (250 mg total) by mouth 4 (four) times daily.     pravastatin 80 MG tablet  Commonly known as:  PRAVACHOL  Take 1 tablet (80 mg total) by mouth every evening.        Meds ordered this encounter  Medications  . DISCONTD: penicillin v potassium (VEETID) 250 MG tablet    Sig: Take 250 mg by mouth 4 (four) times daily.  . penicillin v potassium (VEETID) 250 MG tablet    Sig: Take 1 tablet (250 mg total) by mouth 4 (four) times daily.    Dispense:  28 tablet    Refill:  0  . ibuprofen (ADVIL,MOTRIN) 600 MG tablet    Sig: Take 1 tablet (600 mg total) by mouth every 6 (six) hours as needed for moderate pain.    Dispense:  90 tablet  Refill:  0     There is no immunization history on file for this patient.  Family History  Problem Relation Age of Onset  . Hyperlipidemia    . Diabetes    . Heart disease    . Cancer Maternal Aunt     History  Substance Use Topics  . Smoking status: Former Games developer  . Smokeless tobacco: Not on file  . Alcohol Use: Yes    Review of Systems  As noted in HPI  Filed Vitals:   06/11/13 1117  BP: 150/90  Pulse:   Temp:   Resp:     Physical Exam  Physical Exam  Constitutional: No distress.  HENT:  Dental cavities left upper molar, mild swelling at the left upper jaw no apparent discharge  Cardiovascular: Normal rate and regular rhythm.   Pulmonary/Chest: No respiratory distress. He has no wheezes. He has no rales.  Musculoskeletal:  Lower lumbar spinal and paraspinal tenderness    CBC    Component Value Date/Time   WBC 10.6* 04/02/2011 0612   RBC 5.08 04/02/2011 0612   HGB 14.1 04/02/2011 0612   HCT 43.9 04/02/2011 0612   PLT 205 04/02/2011 0612    MCV 86.4 04/02/2011 0612   LYMPHSABS 1.5 03/31/2011 1240   MONOABS 0.6 03/31/2011 1240   EOSABS 0.0 03/31/2011 1240   BASOSABS 0.0 03/31/2011 1240    CMP     Component Value Date/Time   NA 135 05/04/2013 1107   K 4.1 05/04/2013 1107   CL 104 05/04/2013 1107   CO2 28 05/04/2013 1107   GLUCOSE 89 05/04/2013 1107   BUN 16 05/04/2013 1107   CREATININE 1.2 05/04/2013 1107   CALCIUM 8.9 05/04/2013 1107   PROT 6.3 05/04/2013 1107   ALBUMIN 3.3* 05/04/2013 1107   AST 16 05/04/2013 1107   ALT 20 05/04/2013 1107   ALKPHOS 61 05/04/2013 1107   BILITOT 0.4 05/04/2013 1107   GFRNONAA 63* 04/03/2011 0630   GFRAA 73* 04/03/2011 0630    Lab Results  Component Value Date/Time   CHOL 211* 05/04/2013 11:07 AM    No components found with this basename: hga1c    Lab Results  Component Value Date/Time   AST 16 05/04/2013 11:07 AM    Assessment and Plan  HTN (hypertension) Repeat manual blood pressure is 150/90, as per patient he just took his 2 blood pressure medication half-an-hour, advised patient for low salt diet monitor blood pressure continue following up with cardiologist.   Back pain  continue with physical therapy obtain  previous medical record .  Pain, dental - Plan: penicillin v potassium (VEETID) 250 MG tablet, Ambulatory referral to Dentistry, ibuprofen (ADVIL,MOTRIN) 600 MG tablet  Cavities - Plan: Ambulatory referral to Dentistry   Return in about 3 months (around 09/09/2013).  Doris Cheadle, MD

## 2013-06-12 ENCOUNTER — Ambulatory Visit: Payer: Self-pay | Admitting: Rehabilitation

## 2013-06-16 ENCOUNTER — Other Ambulatory Visit: Payer: Self-pay | Admitting: Cardiovascular Disease

## 2013-06-18 ENCOUNTER — Ambulatory Visit: Payer: Self-pay | Attending: Internal Medicine | Admitting: Rehabilitation

## 2013-06-18 ENCOUNTER — Ambulatory Visit: Payer: Self-pay | Admitting: Internal Medicine

## 2013-06-18 DIAGNOSIS — M545 Low back pain, unspecified: Secondary | ICD-10-CM | POA: Insufficient documentation

## 2013-06-18 DIAGNOSIS — IMO0001 Reserved for inherently not codable concepts without codable children: Secondary | ICD-10-CM | POA: Insufficient documentation

## 2013-06-18 DIAGNOSIS — R293 Abnormal posture: Secondary | ICD-10-CM | POA: Insufficient documentation

## 2013-06-21 ENCOUNTER — Ambulatory Visit: Payer: Self-pay | Admitting: Rehabilitation

## 2013-06-29 ENCOUNTER — Other Ambulatory Visit: Payer: Self-pay | Admitting: Internal Medicine

## 2013-07-02 ENCOUNTER — Ambulatory Visit: Payer: Self-pay

## 2013-07-04 ENCOUNTER — Ambulatory Visit: Payer: Self-pay | Admitting: Rehabilitation

## 2013-07-10 ENCOUNTER — Ambulatory Visit: Payer: Self-pay | Admitting: Rehabilitation

## 2013-07-12 ENCOUNTER — Encounter: Payer: Self-pay | Admitting: Rehabilitation

## 2013-07-19 ENCOUNTER — Other Ambulatory Visit: Payer: Self-pay | Admitting: Cardiovascular Disease

## 2013-07-25 ENCOUNTER — Ambulatory Visit: Payer: No Typology Code available for payment source | Attending: Internal Medicine

## 2013-08-29 ENCOUNTER — Ambulatory Visit: Payer: No Typology Code available for payment source | Attending: Internal Medicine | Admitting: Internal Medicine

## 2013-08-29 ENCOUNTER — Encounter: Payer: Self-pay | Admitting: Internal Medicine

## 2013-08-29 VITALS — BP 133/86 | HR 59 | Temp 98.0°F | Resp 16 | Ht 68.0 in | Wt 225.0 lb

## 2013-08-29 DIAGNOSIS — I252 Old myocardial infarction: Secondary | ICD-10-CM | POA: Insufficient documentation

## 2013-08-29 DIAGNOSIS — I10 Essential (primary) hypertension: Secondary | ICD-10-CM

## 2013-08-29 DIAGNOSIS — R209 Unspecified disturbances of skin sensation: Secondary | ICD-10-CM | POA: Insufficient documentation

## 2013-08-29 DIAGNOSIS — K0889 Other specified disorders of teeth and supporting structures: Secondary | ICD-10-CM

## 2013-08-29 DIAGNOSIS — I2510 Atherosclerotic heart disease of native coronary artery without angina pectoris: Secondary | ICD-10-CM | POA: Insufficient documentation

## 2013-08-29 DIAGNOSIS — G8929 Other chronic pain: Secondary | ICD-10-CM | POA: Insufficient documentation

## 2013-08-29 DIAGNOSIS — R202 Paresthesia of skin: Secondary | ICD-10-CM

## 2013-08-29 DIAGNOSIS — Z79899 Other long term (current) drug therapy: Secondary | ICD-10-CM | POA: Insufficient documentation

## 2013-08-29 DIAGNOSIS — F17200 Nicotine dependence, unspecified, uncomplicated: Secondary | ICD-10-CM | POA: Insufficient documentation

## 2013-08-29 DIAGNOSIS — M549 Dorsalgia, unspecified: Secondary | ICD-10-CM | POA: Insufficient documentation

## 2013-08-29 DIAGNOSIS — K089 Disorder of teeth and supporting structures, unspecified: Secondary | ICD-10-CM

## 2013-08-29 DIAGNOSIS — Z7982 Long term (current) use of aspirin: Secondary | ICD-10-CM | POA: Insufficient documentation

## 2013-08-29 DIAGNOSIS — R200 Anesthesia of skin: Secondary | ICD-10-CM

## 2013-08-29 MED ORDER — CYCLOBENZAPRINE HCL 5 MG PO TABS: 5.0000 mg | ORAL_TABLET | Freq: Three times a day (TID) | ORAL | Status: DC | PRN

## 2013-08-29 MED ORDER — IBUPROFEN 600 MG PO TABS: 600.0000 mg | ORAL_TABLET | Freq: Four times a day (QID) | ORAL | Status: DC | PRN

## 2013-08-29 NOTE — Progress Notes (Signed)
Pt is here following up on his chronic lower back pain and HTN. Pt report that his right arm goes numb and feels like it freezes up.
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
## 2013-08-30 ENCOUNTER — Ambulatory Visit (HOSPITAL_COMMUNITY)
Admission: RE | Admit: 2013-08-30 | Discharge: 2013-08-30 | Disposition: A | Discharge status: Home / Self Care | Payer: No Typology Code available for payment source | Source: Ambulatory Visit | Attending: Internal Medicine | Admitting: Internal Medicine

## 2013-08-30 DIAGNOSIS — R202 Paresthesia of skin: Secondary | ICD-10-CM

## 2013-08-30 DIAGNOSIS — R200 Anesthesia of skin: Secondary | ICD-10-CM

## 2013-08-30 DIAGNOSIS — M47812 Spondylosis without myelopathy or radiculopathy, cervical region: Secondary | ICD-10-CM | POA: Insufficient documentation

## 2013-08-30 DIAGNOSIS — M542 Cervicalgia: Secondary | ICD-10-CM | POA: Insufficient documentation

## 2013-08-30 LAB — VITAMIN B12: Vitamin B-12: 513 pg/mL (ref 211–911)

## 2013-09-10 ENCOUNTER — Ambulatory Visit: Payer: Self-pay | Admitting: Internal Medicine

## 2013-10-21 ENCOUNTER — Other Ambulatory Visit: Payer: Self-pay | Admitting: Cardiovascular Disease

## 2013-10-29 ENCOUNTER — Encounter: Payer: Self-pay | Admitting: Physical Medicine & Rehabilitation

## 2013-10-31 ENCOUNTER — Encounter: Payer: No Typology Code available for payment source | Admitting: Cardiovascular Disease

## 2013-10-31 NOTE — Progress Notes (Signed)
No show

## 2013-11-21 ENCOUNTER — Encounter: Payer: Self-pay | Admitting: Cardiovascular Disease

## 2013-11-28 ENCOUNTER — Encounter: Payer: Self-pay | Admitting: Internal Medicine

## 2013-11-28 ENCOUNTER — Ambulatory Visit: Payer: No Typology Code available for payment source | Attending: Internal Medicine | Admitting: Internal Medicine

## 2013-11-28 VITALS — BP 110/73 | HR 62 | Temp 98.0°F | Resp 16 | Ht 68.0 in | Wt 226.0 lb

## 2013-11-28 DIAGNOSIS — K0889 Other specified disorders of teeth and supporting structures: Secondary | ICD-10-CM

## 2013-11-28 DIAGNOSIS — F17200 Nicotine dependence, unspecified, uncomplicated: Secondary | ICD-10-CM | POA: Insufficient documentation

## 2013-11-28 DIAGNOSIS — Z7982 Long term (current) use of aspirin: Secondary | ICD-10-CM | POA: Insufficient documentation

## 2013-11-28 DIAGNOSIS — R200 Anesthesia of skin: Secondary | ICD-10-CM | POA: Insufficient documentation

## 2013-11-28 DIAGNOSIS — I10 Essential (primary) hypertension: Secondary | ICD-10-CM | POA: Insufficient documentation

## 2013-11-28 DIAGNOSIS — R202 Paresthesia of skin: Secondary | ICD-10-CM

## 2013-11-28 DIAGNOSIS — I252 Old myocardial infarction: Secondary | ICD-10-CM | POA: Insufficient documentation

## 2013-11-28 DIAGNOSIS — K089 Disorder of teeth and supporting structures, unspecified: Secondary | ICD-10-CM | POA: Insufficient documentation

## 2013-11-28 DIAGNOSIS — I2510 Atherosclerotic heart disease of native coronary artery without angina pectoris: Secondary | ICD-10-CM | POA: Insufficient documentation

## 2013-11-28 DIAGNOSIS — R209 Unspecified disturbances of skin sensation: Secondary | ICD-10-CM | POA: Insufficient documentation

## 2013-11-28 DIAGNOSIS — F1010 Alcohol abuse, uncomplicated: Secondary | ICD-10-CM | POA: Insufficient documentation

## 2013-11-28 MED ORDER — IBUPROFEN 800 MG PO TABS: 800.0000 mg | ORAL_TABLET | Freq: Three times a day (TID) | ORAL | Status: DC | PRN

## 2013-11-28 NOTE — Progress Notes (Signed)
Patient ID: Michael [name], male   DOB: 06/29/1960, 53 y.o.   MRN: [id number]   Michael AlyGary Ausmus, is a 53 y.o. male  [id number]  [id number]  DOB - 03/05/1961  Chief Complaint  Patient presents with  . Follow-up        Subjective:   Michael [name] is a 53 y.o. male here today for a follow up visit. Patient has history of hypertension, tobacco abuse, alcohol abuse and coronary artery disease with previous STEMI. Today for routine follow-up of hypertension. Patient claims he is compliant with medications. Patient follows up regularly with cardiologist. Patient reports that he is still having pain in his arm on his back. He continues to smoke cigarette. Patient has No headache, No chest pain, No abdominal pain - No Nausea, No new weakness tingling or numbness, No Cough - SOB.  Problem  Numbness and Tingling of Right Arm    ALLERGIES: Allergies  Allergen Reactions  . Ivp Dye [Iodinated Diagnostic Agents] Swelling    PAST MEDICAL HISTORY: Past Medical History  Diagnosis Date  . Anterior myocardial infarction     ST-elevation; S/P emergent  drug-eluting stenting of proximal left anterior descending  . Coronary artery disease     Cath October 2012-moderate disease in RCA, Circumflex  . Renal insufficiency   . Hypertension   . Tobacco abuse   . Alcohol abuse     MEDICATIONS AT HOME: Prior to Admission medications   Medication Sig Start Date End Date Taking? Authorizing Marialena Wollen  aspirin EC 81 MG tablet Take 81 mg by mouth daily.     Yes Historical Masaki Rothbauer, MD  cyclobenzaprine (FLEXERIL) 5 MG tablet Take 1 tablet (5 mg total) by mouth 3 (three) times daily as needed for muscle spasms. 08/29/13  Yes Doris Cheadleeepak Advani, MD  ibuprofen (ADVIL,MOTRIN) 800 MG tablet Take 1 tablet (800 mg total) by mouth every 8 (eight) hours as needed for moderate pain. 11/28/13  Yes Jeanann Lewandowskylugbemiga Jegede, MD  lisinopril (PRINIVIL,ZESTRIL) 20 MG tablet TAKE ONE TABLET BY MOUTH ONCE DAILY   Yes  Kathleene Hazelhristopher D McAlhany, MD  metoprolol (LOPRESSOR) 50 MG tablet TAKE ONE TABLET BY MOUTH TWICE DAILY   Yes Kathleene Hazelhristopher D McAlhany, MD  nitroGLYCERIN (NITROSTAT) 0.4 MG SL tablet Place 1 tablet (0.4 mg total) under the tongue every 5 (five) minutes as needed. 06/26/12  Yes Kathleene Hazelhristopher D McAlhany, MD  NON FORMULARY PENICILLIN AS DIRECTED    Historical Nilesh Stegall, MD  penicillin v potassium (VEETID) 250 MG tablet Take 1 tablet (250 mg total) by mouth 4 (four) times daily. 06/11/13   Doris Cheadleeepak Advani, MD  pravastatin (PRAVACHOL) 80 MG tablet Take 1 tablet (80 mg total) by mouth every evening. 05/07/13   Kathleene Hazelhristopher D McAlhany, MD     Objective:   Filed Vitals:   11/28/13 1109  BP: 110/73  Pulse: 62  Temp: 98 F (36.7 C)  TempSrc: Oral  Resp: 16  Height: 5\' 8"  (1.727 m)  Weight: 226 lb (102.513 kg)  SpO2: 99%    Exam General appearance : Awake, alert, not in any distress. Speech Clear. Not toxic looking HEENT: Atraumatic and Normocephalic, pupils equally reactive to light and accomodation Neck: supple, no JVD. No cervical lymphadenopathy.  Chest:Good air entry bilaterally, no added sounds  CVS: S1 S2 regular, no murmurs.  Abdomen: Bowel sounds present, Non tender and not distended with no gaurding, rigidity or rebound. Extremities: B/L Lower Ext shows no edema, both legs are warm to touch Neurology: Awake alert, and oriented X 3, CN  II-XII intact, Non focal Skin:No Rash Wounds:N/A  Data Review Lab Results  Component Value Date   HGBA1C 5.6 05/18/2013     Assessment & Plan   1. HTN (hypertension): Controlled Continue current medication  2. Numbness and tingling of right arm  - ibuprofen (ADVIL,MOTRIN) 800 MG tablet; Take 1 tablet (800 mg total) by mouth every 8 (eight) hours as needed for moderate pain.  Dispense: 90 tablet; Refill: 1  - MR Cervical Spine Wo Contrast; Future  3. Pain, dental Referral to dentist  Patient was counseled extensively about nutrition and  exercise  Patient was again counseled extensively about smoking cessation.  Return in about 6 months (around 05/30/2014) for Follow up HTN, Follow up Pain and comorbidities.  The patient was given clear instructions to go to ER or return to medical center if symptoms don't improve, worsen or new problems develop. The patient verbalized understanding. The patient was told to call to get lab results if they haven't heard anything in the next week.   This note has been created with Education officer, environmentalDragon speech recognition software and smart phrase technology. Any transcriptional errors are unintentional.    Jeanann LewandowskyJEGEDE, OLUGBEMIGA, MD, MHA, FACP, FAAP [facility] [place], [place] [phone]   11/28/2013, 5:55 PM

## 2013-11-28 NOTE — Progress Notes (Signed)
Pt is here following up on his HTN. Pt reports that he is still having pain in his arm and in his back.

## 2013-12-06 ENCOUNTER — Ambulatory Visit (HOSPITAL_COMMUNITY)
Admission: RE | Admit: 2013-12-06 | Discharge: 2013-12-06 | Disposition: A | Discharge status: Home / Self Care | Payer: No Typology Code available for payment source | Source: Ambulatory Visit | Attending: Internal Medicine | Admitting: Internal Medicine

## 2013-12-06 DIAGNOSIS — M5020 Other cervical disc displacement, unspecified cervical region: Secondary | ICD-10-CM | POA: Insufficient documentation

## 2013-12-06 DIAGNOSIS — R200 Anesthesia of skin: Secondary | ICD-10-CM

## 2013-12-06 DIAGNOSIS — R202 Paresthesia of skin: Secondary | ICD-10-CM

## 2013-12-12 ENCOUNTER — Other Ambulatory Visit: Payer: Self-pay

## 2013-12-12 ENCOUNTER — Other Ambulatory Visit: Payer: Self-pay | Admitting: Cardiovascular Disease

## 2013-12-12 MED ORDER — METOPROLOL TARTRATE 50 MG PO TABS: ORAL_TABLET | ORAL | Status: DC

## 2013-12-26 ENCOUNTER — Telehealth: Payer: Self-pay | Admitting: Emergency Medicine

## 2013-12-26 DIAGNOSIS — M4802 Spinal stenosis, cervical region: Secondary | ICD-10-CM

## 2013-12-26 NOTE — Telephone Encounter (Signed)
Left message with pt mother to have pt call for MRI results Neurosurgeon referral placed per Dr. Hyman HopesJegede

## 2013-12-26 NOTE — Telephone Encounter (Signed)
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 There is right foraminal encroachment and mild spinal stenosis, which means that some of the nerves from the neck to his hands might be impinged. We will need to refer her to neurosurgery for further tests and management            Please place a referral to neurosurgery for cervical spinal stenosis and foraminal encroachment with symptoms of numbness and tingling of both hands ------

## 2014-01-01 ENCOUNTER — Telehealth: Payer: Self-pay | Admitting: Emergency Medicine

## 2014-01-01 NOTE — Telephone Encounter (Signed)
Pt was told per Arna Mediciora that he will have to pay $200 co- pay for Neurosurgery s/p MRI results. Pt states he is working on getting the money and will call clinic with updates. Results given

## 2014-01-14 ENCOUNTER — Ambulatory Visit (INDEPENDENT_AMBULATORY_CARE_PROVIDER_SITE_OTHER): Payer: No Typology Code available for payment source | Admitting: Cardiovascular Disease

## 2014-01-14 ENCOUNTER — Encounter: Payer: Self-pay | Admitting: Cardiovascular Disease

## 2014-01-14 VITALS — BP 100/70 | HR 58 | Ht 68.0 in | Wt 220.0 lb

## 2014-01-14 DIAGNOSIS — I2510 Atherosclerotic heart disease of native coronary artery without angina pectoris: Secondary | ICD-10-CM

## 2014-01-14 DIAGNOSIS — I10 Essential (primary) hypertension: Secondary | ICD-10-CM

## 2014-01-14 DIAGNOSIS — E785 Hyperlipidemia, unspecified: Secondary | ICD-10-CM

## 2014-01-14 MED ORDER — LOVASTATIN 20 MG PO TABS: 20.0000 mg | ORAL_TABLET | Freq: Every day | ORAL | Status: DC

## 2014-01-14 MED ORDER — METOPROLOL TARTRATE 50 MG PO TABS: ORAL_TABLET | ORAL | Status: DC

## 2014-01-14 MED ORDER — LISINOPRIL 20 MG PO TABS: ORAL_TABLET | ORAL | Status: DC

## 2014-01-14 NOTE — Patient Instructions (Signed)
Your physician wants you to follow-up in:  6 months. You will receive a reminder letter in the mail two months in advance. If you don't receive a letter, please call our office to schedule the follow-up appointment.  Your physician has recommended you make the following change in your medication: Start Lovastatin 20 mg by mouth daily.   Your physician recommends that you return for fasting lab work in:  12 weeks.  This is the week of April 08, 2014. The lab opens at 7:30 AM every week day.

## 2014-01-14 NOTE — Progress Notes (Signed)
History of Present Illness: 53 yo AAM with history of CAD, HTN here today for cardiac follow up. He was admitted to [facility] 03/31/11 with anterior STEMI with occluded LAD now s/p DES x 1 proximal LAD. His cath was performed by Dr. Kirke Corin. Diffuse distal disease in the right posterolateral branch. The third OM was occluded and filled from left to left collaterals. Proximal LAD occlusion treated with 2.75 x 24 mm Promus Element DES x 1. He had a severe dye reaction. LV function was preserved by echo.    He is here today for follow up. He tells me that he has been doing well. No chest pain. No SOB, palpitations, near syncope or syncope. He has been walking every day. His energy level is normal.   Primary Care Physician: Hyman Hopes  Last Lipid Profile:Lipid Panel     Component Value Date/Time   CHOL 211* 05/04/2013 1107   TRIG 119.0 05/04/2013 1107   HDL 32.50* 05/04/2013 1107   CHOLHDL 6 05/04/2013 1107   VLDL 23.8 05/04/2013 1107   LDLCALC 110* 04/01/2011 0545     Past Medical History  Diagnosis Date  . Anterior myocardial infarction     ST-elevation; S/P emergent  drug-eluting stenting of proximal left anterior descending  . Coronary artery disease     Cath October 2012-moderate disease in RCA, Circumflex  . Renal insufficiency   . Hypertension   . Tobacco abuse   . Alcohol abuse     Past Surgical History  Procedure Laterality Date  . Doppler echocardiography      Preserved left venticular function  . Leg surgery      Allergies  Allergen Reactions  . Ivp Dye [Iodinated Diagnostic Agents] Swelling    History   Social History  . Marital Status: Single    Spouse Name: N/A    Number of Children: 0  . Years of Education: N/A   Occupational History  . Not on file.   Social History Main Topics  . Smoking status: Former Games developer  . Smokeless tobacco: Not on file  . Alcohol Use: Yes  . Drug Use: No  . Sexual Activity: Not on file   Other Topics Concern  . Not on file    Social History Narrative  . No narrative on file    Family History  Problem Relation Age of Onset  . Hyperlipidemia    . Diabetes    . Heart disease    . Cancer Maternal Aunt    Meds:  Current Outpatient Prescriptions on File Prior to Visit  Medication Sig Dispense Refill  . aspirin EC 81 MG tablet Take 81 mg by mouth daily.        Marland Kitchen ibuprofen (ADVIL,MOTRIN) 800 MG tablet Take 1 tablet (800 mg total) by mouth every 8 (eight) hours as needed for moderate pain.  90 tablet  1  . lisinopril (PRINIVIL,ZESTRIL) 20 MG tablet TAKE ONE TABLET BY MOUTH ONCE DAILY **PATIENT NEEDS TO CONTACT OFFICE TO SCHEDULE**  30 tablet  0  . metoprolol (LOPRESSOR) 50 MG tablet TAKE ONE TABLET BY MOUTH TWICE DAILY  60 tablet  1  . nitroGLYCERIN (NITROSTAT) 0.4 MG SL tablet Place 1 tablet (0.4 mg total) under the tongue every 5 (five) minutes as needed.  25 tablet  6   No current facility-administered medications on file prior to visit.   Review of Systems:  As stated in the HPI and otherwise negative.   BP 100/70  Pulse 58  Ht 5'  8" (1.727 m)  Wt 220 lb (99.791 kg)  BMI 33.46 kg/m2  Physical Examination: General: Well developed, well nourished, NAD HEENT: OP clear, mucus membranes moist SKIN: warm, dry. No rashes. Neuro: No focal deficits Musculoskeletal: Muscle strength 5/5 all ext Psychiatric: Mood and affect normal Neck: No JVD, no carotid bruits, no thyromegaly, no lymphadenopathy. Lungs:Clear bilaterally, no wheezes, rhonci, crackles Cardiovascular: Regular rate and rhythm. No murmurs, gallops or rubs. Abdomen:Soft. Bowel sounds present. Non-tender.  Extremities: No lower extremity edema. Pulses are 2 + in the bilateral DP/PT.  Cardiac cath 03/31/11: 1. Right coronary artery: The vessel is large in size and dominant.  There is a mild 20% disease in the mid segment with minor luminal  irregularities distally. The PDA is normal in size and free of  significant disease. First  posterolateral branch is normal in size  with 40% proximal disease. The second posterolateral branch is  relatively large in size with 50% disease at the ostium followed by  a 70% lesion proximally and diffuse 80% disease distally. The RCA  gives collaterals to the left anterior descending artery as well as  OM-3.  2. Left main coronary artery: The vessel is normal in size without  significant disease.  3. Left anterior descending artery: The vessel is normal in size and  is occluded proximally a small septal branch. The distal LAD does  not have significant disease. The first diagonal branch is normal  to large in size and has 2 branches. The superior branch is  overall normal. The inferior branch has a 90% proximal disease.  4. Left circumflex artery: The vessel is normal in size and  nondominant. The first obtuse marginal is normal in size with a  mild proximal disease. The second OM is normal in size with no  significant disease. Third OM appears to be occluded with  collaterals supplied from the distal LAD and the right coronary  artery.   EKG: Sinus brady, rate 58 bpm. PVCs.   Assessment and Plan:   1. CAD: Stable post STEMI 03/31/11 with DES placed in LAD. Moderate disease in other vessels. Continue ASA, beta blocker, and Ace-inh. Will restart statin.   2. HTN: BP controlled. Continue metoprolol and Lisinopril.   3. HLD: Will start Lovastatin 20 mg daily. [facility] am [facility]. He will check to see the cost of atorvastatin in case we need to use it in the future. Repeat lipids and LFTs in 12 weeks.

## 2014-01-18 ENCOUNTER — Ambulatory Visit: Payer: No Typology Code available for payment source

## 2014-03-05 ENCOUNTER — Ambulatory Visit: Payer: Self-pay

## 2014-04-02 ENCOUNTER — Ambulatory Visit: Payer: Self-pay | Attending: Internal Medicine

## 2014-04-08 ENCOUNTER — Other Ambulatory Visit: Payer: Self-pay

## 2014-04-11 ENCOUNTER — Encounter (HOSPITAL_COMMUNITY): Payer: Self-pay | Admitting: Emergency Medicine

## 2014-04-11 ENCOUNTER — Emergency Department (INDEPENDENT_AMBULATORY_CARE_PROVIDER_SITE_OTHER)
Admission: EM | Admit: 2014-04-11 | Discharge: 2014-04-11 | Disposition: A | Discharge status: Home / Self Care | Payer: Self-pay | Source: Home / Self Care | Attending: Family Medicine | Admitting: Family Medicine

## 2014-04-11 ENCOUNTER — Emergency Department (INDEPENDENT_AMBULATORY_CARE_PROVIDER_SITE_OTHER): Payer: Self-pay

## 2014-04-11 DIAGNOSIS — S86811A Strain of other muscle(s) and tendon(s) at lower leg level, right leg, initial encounter: Secondary | ICD-10-CM

## 2014-04-11 DIAGNOSIS — M25561 Pain in right knee: Secondary | ICD-10-CM

## 2014-04-11 DIAGNOSIS — S032XXA Dislocation of tooth, initial encounter: Secondary | ICD-10-CM

## 2014-04-11 MED ORDER — TRAMADOL HCL 50 MG PO TABS: 50.0000 mg | ORAL_TABLET | Freq: Four times a day (QID) | ORAL | Status: DC | PRN

## 2014-04-11 NOTE — ED Notes (Signed)
Patient reports she fell off porch on Tuesday 10/27.  No loc. Patient has a swollen, painful right knee.  Right leg has red splotching.  Patient also is missing left front tooth, lost in fall

## 2014-04-11 NOTE — Discharge Instructions (Signed)
Patellar Tendon Tear / Disruption with Rehab A patellar tendon tear or disruption is a complete tear of the tendon below the kneecap. The patellar tendon attaches the thigh muscle (quadriceps) to the shinbone (tibia). These muscles are responsible for bending the knee and flexing the hip. A tear in the patellar tendon results in a disability to perform these actions. SYMPTOMS   A "pop" or tear felt in the knee or under the kneecap, at the time of injury.  Pain, tenderness, swelling, warmth, or redness over and around the patellar tendon.  Pain that gets worse when trying to forcefully straighten the knee or bend the knee.  Inability to straighten the knee when seated.  Crackling sound (crepitation) when the tendon is moved or touched.  Bruising (contusion) around the knee within 48 hours of injury.  Loss of firm fullness when pushing on the area where the tendon ruptured (a defect between the ends of the tendon where they separated from each other). CAUSES  The patellar tendon tears when a force is placed on it that is greater than it can handle. Common causes of injury include:  A stressful incident, such as with jumping, hurdling, or starting a sprint.  Direct hit (trauma) to the knee. RISK INCREASES WITH:  Sports that require sudden, explosive muscle contraction, such as those involving jumping or quick starts.  Running or contact sports.  Poor strength and flexibility.  Previous patellar tendon injury.  Untreated patellar tendinitis.  Corticosteroid injection into the patellar tendon. (Corticosteroid injections weaken tendons.) PREVENTION  Warm up and stretch properly before activity.  Allow for adequate recovery between workouts.  Maintain physical fitness:  Strength, flexibility, and endurance.  Cardiovascular fitness.  Protect the knee with taping, protective strapping, or elastic compression bandage during activity. PROGNOSIS  If treated properly, patellar  tendon tears usually heal, with a return to sports within 6 to 9 months after injury.  RELATED COMPLICATIONS   Weakness of the thigh (quadriceps) muscles, especially if the tear is left untreated.  Re-rupture of the tendon after treatment.  Prolonged disability.  Risks of surgery: infection, injury to nerves (numbness, weakness, or paralysis), bleeding, knee stiffness, knee weakness, pain when sitting for long periods, pain when getting up from a seated position and when kneeling or squatting, pain going up or down stairs or hills, and knee giving way or buckling. TREATMENT  Treatment first involves resting from any activities that aggravate the symptoms. The use of ice and medicine will help reduce pain and inflammation. Applying a compression bandage and elevating the knee above the level of the heart will also help reduce inflammation. Definitive treatment for patellar tendon tears is surgery, because contraction of the quadriceps tendon prevents healing of the tendon. Surgery often involves using stitches (sutures) to sew the ends of the tendon back together. Surgery is followed by restraint of the knee, to allow for healing. After restraint, it is important to perform strengthening and stretching exercises to help regain strength and a full range of motion. These exercises may be completed at home or with a therapist.  MEDICATION   If pain medicine is needed, nonsteroidal anti-inflammatory medicines (aspirin and ibuprofen), or other minor pain relievers (acetaminophen), are often advised.  Do not take pain medicine for 7 days before surgery.  Prescription pain relievers may be given, if your caregiver thinks they are needed. Use only as directed and only as much as you need. COLD THERAPY  Cold treatment (icing) should be applied for 10 to 15  minutes every 2 to 3 hours for inflammation and pain, and immediately after activity that aggravates your symptoms. Use ice packs or an ice  massage. SEEK MEDICAL CARE IF:  Pain increases, despite treatment.  Cast discomfort develops.  Any of the following occur after surgery: signs of infection, including fever, increased pain, swelling, redness, drainage of fluids, or bleeding in the affected area.  New, unexplained symptoms develop. (Drugs used in treatment may produce side effects.) EXERCISES RANGE OF MOTION (ROM) AND STRETCHING EXERCISES - Patellar Tendon Tear/Disruption These exercises may help you when beginning to rehabilitate your injury. Your symptoms may resolve with or without further involvement from your physician, physical therapist or athletic trainer. While completing these exercises, remember:   Restoring tissue flexibility helps normal motion to return to the joints. This allows healthier, less painful movement and activity.  An effective stretch should be held for at least 30 seconds.  A stretch should never be painful. You should only feel a gentle lengthening or release in the stretched tissue. RANGE OF MOTION - Knee Flexion and Extension, Active-Assisted  Sit on the edge of a table or chair with your thighs firmly supported. It may be helpful to place a folded towel under the end of your right / left thigh.  Flexion (bending): Place the ankle of your healthy leg on top of the other ankle. Use your healthy leg to gently bend your right / left knee until you feel a mild tension across the top of your knee.  Hold for __________ seconds.  Extension (straightening): Switch your ankles so your right / left leg is on top. Use your healthy leg to straighten your right / left knee until you feel a mild tension on the backside of your knee.  Hold for __________ seconds. Repeat __________ times. Complete this exercise __________ times per day. RANGE OF MOTION - Knee Flexion, Active  Lie on your back with both knees straight. (If this causes back discomfort, bend your healthy knee, placing your foot flat on the  floor.)  Slowly slide your heel back toward your buttocks until you feel a gentle stretch in the front of your knee or thigh.  Hold for __________ seconds. Slowly slide your heel back to the starting position. Repeat __________ times. Complete this exercise __________ times per day.  STRENGTHENING EXERCISES - Patellar Tendon Tear/Disruption  These exercises may help you when beginning to rehabilitate your injury. They may resolve your symptoms with or without further involvement from your physician, physical therapist or athletic trainer. While completing these exercises, remember:   Muscles can gain both the endurance and the strength needed for everyday activities through controlled exercises.  Complete these exercises as instructed by your physician, physical therapist or athletic trainer. Increase the resistance and repetitions only as guided by your caregiver. STRENGTH - Quadriceps, Isometrics  Lie on your back with your right / left leg extended and your opposite knee bent.  Gradually tense the muscles in the front of your right / left thigh. You should see either your kneecap slide up toward your hip or increased dimpling just above the knee. This motion will push the back of the knee down toward the floor, mat, or bed on which you are lying.  Hold the muscle as tight as you can without increasing your pain for __________ seconds.  Relax the muscles slowly and completely between each repetition. Repeat __________ times. Complete this exercise __________ times per day.  STRENGTH - Quadriceps, Straight Leg Raises Quality counts!  Watch for signs that the quadriceps muscle is working, to be sure you are strengthening the correct muscles and not "cheating" by substituting with healthier muscles.  Lay on your back with your right / left leg extended and your opposite knee bent.  Tense the muscles in the front of your right / left thigh. You should see either your kneecap slide up or  increased dimpling just above the knee. Your thigh may even shake a bit.  Tighten these muscles even more and raise your leg 4 to 6 inches off the floor. Hold for __________ seconds.  Keeping these muscles tense, lower your leg.  Relax the muscles slowly and completely between each repetition. Repeat __________ times. Complete this exercise __________ times per day.  STRENGTH - Hip Abductors, Straight Leg Raises  Be aware of your form throughout the entire exercise, so that you exercise the correct muscles Poor form means that you are not strengthening the correct muscles.  Lie on your side so that your head, shoulders, knee and hip line up. You may bend your lower knee to help maintain your balance. Your right / left leg should be on top.  Roll your hips slightly forward, so that your hips are stacked directly over each other and your right / left knee is facing forward.  Lift your top leg up 4-6 inches, leading with your heel. Be sure that your foot does not drift forward or that your knee does not roll toward the ceiling.  Hold this position for __________ seconds. You should feel the muscles in your outer hip lifting. (You may not notice this until your leg begins to tire.)  Slowly lower your leg to the starting position. Allow the muscles to fully relax before beginning the next repetition. Repeat __________ times. Complete this exercise __________ times per day.  STRENGTH - Hip Extensors, Straight Leg Raises  Lie on your stomach on a firm surface.  Tense the muscles in your buttocks to lift your right / left leg about 4 inches. If you cannot lift your leg this high without arching your back, place a pillow under your hips.  Keep your knee straight. Hold __________ seconds.  Slowly lower your leg to the starting position and allow it to relax completely before starting the next repetition. Repeat __________ times. Complete this exercise __________ times per day.  STRENGTH - Hip  Adductors, Straight Leg Raises  Lie on your side so that your head, shoulders, knee and hip line up. You may place your upper foot in front, to help maintain your balance. Your right / left leg should be on the bottom.  Roll your hips slightly forward, so that your hips are stacked directly over each other and your right / left knee is facing forward.  Tense the muscles in your inner thigh and lift your bottom leg 4-6 inches. Hold this position for __________ seconds.  Slowly lower your leg to the starting position. Allow the muscles to fully relax before beginning the next repetition. Repeat __________ times. Complete this exercise __________ times per day. Document Released: 05/31/2005 Document Revised: 08/23/2011 Document Reviewed: 09/12/2008 [facility] Patient Information 2015 [place], [place]. This information is not intended to replace advice given to you by your health care provider. Make sure you discuss any questions you have with your health care provider.  Tooth Loss Another word for a tooth getting knocked out is avulsion. When a tooth is knocked out, treatment includes controlling bleeding and pain. Permanent teeth may be successfully  reimplanted in some cases. The sooner the tooth is cleaned and replaced in the proper position in the socket, the better the chance it can be saved. Evaluation by a dentist as soon as possible is needed so that the tooth may be positioned and stabilized. A temporary splint may be used to hold the tooth in place for the first 3 to 4 weeks. A splint joins the weak tooth to a strong tooth to increase the strength of the weak tooth. Baby teeth that are knocked out do not need to be reimplanted. Reimplantation can be helped with emergency care if the entire tooth has been knocked out. This type of repair can be done only if the tooth can be reinserted within 2 hours of the accident. It is best if it can be done within a few minutes of the accident. A dentist should  be seen as soon as possible. After 2 hours, the chances of saving the tooth are minimal. However, dental referral can be beneficial. Your dentist will discuss your options with you.  STEPS TO TAKE IF YOU LOSE A TOOTH  Do not handle the tooth by the root.  Wash the tooth off in clean water but not under a tap. Do not scrub the tooth.  You may try to replace the tooth in the socket. Gently bite down on it to get it in place.  If trying to replace the tooth does not work, keep the tooth in a glass of milk or water. You may keep it in your own mouth if there is no danger of swallowing it. However, this is not recommended for children. HOME CARE INSTRUCTIONS   You can use ice packs along your jaw to help control swelling and pain.  For the next few days, eat a liquid or soft diet and rinse your mouth out with warm water after meals.  Watch for signs of infection.  You should take all medications for pain and antibiotics as prescribed by your dentist.  An avulsed tooth will require stabilization for 1 to 2 months to ensure proper healing. This means it should not move around. SEEK MEDICAL CARE IF:  Pain is becoming worse or uncontrollable with medication.  You have increased swelling in your face or around the reimplanted tooth.  You have an oral temperature above 102F (38.9C) not controlled by medication.  You cannot open your mouth. Document Released: 02/23/2001 Document Revised: 10/15/2013 Document Reviewed: 09/29/2009 [facility] Patient Information 2015 [place], [place]. This information is not intended to replace advice given to you by your health care provider. Make sure you discuss any questions you have with your health care provider.

## 2014-04-11 NOTE — ED Provider Notes (Signed)
CSN: [id number]     Arrival date & time 04/11/14  1743 History   First MD Initiated Contact with Patient 04/11/14 1815     Chief Complaint  Patient presents with  . Knee Pain   (Consider location/radiation/quality/duration/timing/severity/associated sxs/prior Treatment) HPI     53 year old male presents for evaluation of getting a tooth knocked out and knee pain. He fell off his porch 2 days ago. When he fell they noted that his kneecap was displaced off to the side of his knee but it went back into place when he straightened his leg. Since then he has not been able to walk on it normally. He cannot walk at all at first, but today he came very small amount of weight on the leg. He cannot extend the leg at all. He has swelling around his knee, and also a rash over the knee that he just noticed when he got here today. No numbness distal to the knee. Also he knocked out one of his upper front teeth. He has an appointment or reschedule with his dentist in 1 week. He wants to know if he needs antibiotics.  Past Medical History  Diagnosis Date  . Anterior myocardial infarction     ST-elevation; S/P emergent  drug-eluting stenting of proximal left anterior descending  . Coronary artery disease     Cath October 2012-moderate disease in RCA, Circumflex  . Renal insufficiency   . Hypertension   . Tobacco abuse   . Alcohol abuse    Past Surgical History  Procedure Laterality Date  . Doppler echocardiography      Preserved left venticular function  . Leg surgery     Family History  Problem Relation Age of Onset  . Hyperlipidemia    . Diabetes    . Heart disease    . Cancer Maternal Aunt    History  Substance Use Topics  . Smoking status: Former Games developermoker  . Smokeless tobacco: Not on file  . Alcohol Use: Yes    Review of Systems  HENT: Positive for dental problem.   Musculoskeletal: Positive for arthralgias and joint swelling.  All other systems reviewed and are negative.   Allergies   Ivp dye  Home Medications   Prior to Admission medications   Medication Sig Start Date End Date Taking? Authorizing Provider  aspirin EC 81 MG tablet Take 81 mg by mouth daily.      Historical Provider, MD  ibuprofen (ADVIL,MOTRIN) 800 MG tablet Take 1 tablet (800 mg total) by mouth every 8 (eight) hours as needed for moderate pain. 11/28/13   Quentin Angstlugbemiga E Jegede, MD  lisinopril (PRINIVIL,ZESTRIL) 20 MG tablet TAKE ONE TABLET BY MOUTH ONCE DAILY 01/14/14   Kathleene Hazelhristopher D McAlhany, MD  lovastatin (MEVACOR) 20 MG tablet Take 1 tablet (20 mg total) by mouth at bedtime. 01/14/14   Kathleene Hazelhristopher D McAlhany, MD  metoprolol (LOPRESSOR) 50 MG tablet TAKE ONE TABLET BY MOUTH TWICE DAILY 01/14/14   Kathleene Hazelhristopher D McAlhany, MD  nitroGLYCERIN (NITROSTAT) 0.4 MG SL tablet Place 1 tablet (0.4 mg total) under the tongue every 5 (five) minutes as needed. 06/26/12   Kathleene Hazelhristopher D McAlhany, MD  traMADol (ULTRAM) 50 MG tablet Take 1 tablet (50 mg total) by mouth every 6 (six) hours as needed. 04/11/14   Adrian BlackwaterZachary H Kenna Kirn, PA-C   BP 137/91  Pulse 83  Temp(Src) 98.8 F (37.1 C) (Oral)  Resp 18  SpO2 98% Physical Exam  Nursing note and vitals reviewed. Constitutional: He is oriented to  person, place, and time. He appears well-developed and well-nourished. No distress.  HENT:  Head: Normocephalic.  Mouth/Throat: Abnormal dentition (the superior right sided central incisor has been knocked out, no tooth fragments are visible, no redness or drainage, no swelling).  Pulmonary/Chest: Effort normal. No respiratory distress.  Musculoskeletal:       Right knee: He exhibits decreased range of motion (Patient has absolutely no extension and is unable to hold his leg in extension), effusion (Large joint effusion), deformity (Patella is displaced superiorly) and erythema (There is a red rash over the leg, nontender, not itchy. This is of the anterior leg from the distal thigh through the superior anterior portion of the shin). He  exhibits no ecchymosis. Tenderness (Mild, diffuse tenderness) found.  Neurological: He is alert and oriented to person, place, and time. Coordination normal.  Skin: Skin is warm and dry. No rash noted. He is not diaphoretic.  Psychiatric: He has a normal mood and affect. Judgment normal.    ED Course  Procedures (including critical care time) Labs Review Labs Reviewed - No data to display  Imaging Review Dg Knee Complete 4 Views Right  04/11/2014   CLINICAL DATA:  Acute knee pain after fall.  EXAM: RIGHT KNEE - COMPLETE 4+ VIEW  COMPARISON:  None.  FINDINGS: Patella Alta with marked swelling and indistinctness of the anterior knee soft tissues. Suspect rupture of the patellar tendon. No fracture. No tibiofemoral dislocation. Approximately 6 cm long area of patchy sclerosis in the medullary cavity of the distal femur. Geographic appearance favors bone infarct over a chondroid lesion. There are no aggressive features.  IMPRESSION: 1. Finding of patellar tendon rupture. 2. 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   Electronically Signed   By: Tiburcio PeaJonathan  Watts M.D.   On: 04/11/2014 19:04     MDM   1. Patellar tendon rupture, right, initial encounter   2. Knee pain, acute, right   3. Tooth avulsion, initial encounter    X-ray indicates total patellar tendon rupture. I have spoken with Dr. Eulah PontMurphy, on call orthopedic surgeon. He would like the patient placed in a knee immobilizer and he will see the patient tomorrow morning at 8 AM. I will prescribe tramadol for pain, and he may also take Tylenol. Advised to avoid ibuprofen given his history of an MI.  He will watch for signs of infection on his tooth and follow up with his dentist    Meds ordered this encounter  Medications  . traMADol (ULTRAM) 50 MG tablet    Sig: Take 1 tablet (50 mg total) by mouth every 6 (six) hours as needed.    Dispense:  30 tablet    Refill:  0    Order Specific Question:  Supervising Provider     Answer:  Clementeen GrahamOREY, EVAN, Kathie RhodesS [[id number]]      Graylon GoodZachary H Blimi Godby, PA-C 04/11/14 1939

## 2014-04-15 ENCOUNTER — Telehealth: Payer: Self-pay | Admitting: *Deleted

## 2014-04-15 NOTE — Telephone Encounter (Signed)
Received request for surgical clearance from Dr. Eulah PontMurphy. Pt is scheduled  to have right patellar tendon repair on 04/26/14. Dr. [name] will not be in office again until 04/17/14 to complete paperwork.  I spoke with pt who reports he is doing well from a heart standpoint.  No chest pain or shortness of breath. States he is taking all his medications and blood pressure is doing well.  Pt made aware he can proceed with surgery and information will be sent to Dr. Eulah PontMurphy.  Will forward to Dr. [name] for official clearance and instructions regarding holding ASA prior to surgery

## 2014-04-15 NOTE — Telephone Encounter (Signed)
Dr. [name] in office this afternoon and paperwork completed. [facility]

## 2014-04-16 ENCOUNTER — Telehealth: Payer: Self-pay | Admitting: Cardiovascular Disease

## 2014-04-16 NOTE — Telephone Encounter (Signed)
Note completed. cdm

## 2014-04-16 NOTE — Telephone Encounter (Signed)
Received request from Nurse fax box, documents faxed for surgical clearance. °To: [facility] °Fax number: [phone] °Attention: °11.3.15/km °

## 2014-04-22 NOTE — H&P (Signed)
  Nyah Shepherd/[facility] [address]   [address] [place], [place] [id number] [phone] A Division of [facility]  Loreta Aveaniel F. Tobie Perdue, M.D.   Robert A. Thurston HoleWainer, M.D.   Burnell BlanksW. Dan Caffrey, M.D.   Eulas PostJoshua P. Landau, M.D.   Lunette StandsAnna Voytek, M.D Jewel Baizeimothy D. Eulah PontMurphy, M.D.  Buford DresserWesley R. Ibazebo, M.D.  Estell HarpinJames S. Kramer, M.D.    Melina Fiddlerebecca S. Bassett, M.D. Mary L. Isidoro DonningAnton, PA-C  Kirstin A. Shepperson, PA-C  Josh Galenahadwell, PA-C BroganBrandon Parry, [place] DakotaOPA-C   RE: Artemio A[place], [place]   [phone]      DOB: 02/14/1961 PROGRESS NOTE: 04-12-14 Reason for visit:  [facility].  History of present illness: He was on the porch on Tuesday and his left knee gave out and he fell onto the anterior right knee. He had a pop and pain. He was seen at Urgent Care yesterday and referred to me. He  is in a knee immobilizer.    Please see associated documentation for this clinic visit for further past medical, family, surgical and social history, review of systems, and exam findings as this was reviewed by me.  EXAMINATION: Well appearing male in no apparent distress. The right lower extremity is neurovascularly intact. He has no intact extensor mechanism palpable defect at his patellar tendon and his patella is very high riding. He has a little scab on his anterior knee and mild rash that he reports gets better when he airs out his leg. I've asked him to try to air it out before surgery.  IMAGING: X-rays reviewed by me from [facility] demonstrate extreme patella alta.     ASSESSMENT/PLAN: 1. He has a ruptured patellar tendon. 2. Discussed risks benefits and possible complications of surgical management and he would like to go forward with surgical repair of his patellar tendon.  Jewel Baizeimothy D.  Eulah PontMurphy, M.D.  Electronically verified by Jewel Baizeimothy D. Eulah PontMurphy, M.D. TDM:kah D 04-12-14 T 04-15-14

## 2014-04-23 ENCOUNTER — Encounter (HOSPITAL_BASED_OUTPATIENT_CLINIC_OR_DEPARTMENT_OTHER): Payer: Self-pay | Admitting: *Deleted

## 2014-04-23 NOTE — Progress Notes (Signed)
Bring all medications. Coming tomorrow for BMET. 

## 2014-04-24 ENCOUNTER — Encounter (HOSPITAL_BASED_OUTPATIENT_CLINIC_OR_DEPARTMENT_OTHER)
Admission: RE | Admit: 2014-04-24 | Discharge: 2014-04-24 | Disposition: A | Discharge status: Home / Self Care | Payer: Medicaid Other | Source: Ambulatory Visit | Attending: Orthopedic Surgery | Admitting: Orthopedic Surgery

## 2014-04-24 DIAGNOSIS — Z01812 Encounter for preprocedural laboratory examination: Secondary | ICD-10-CM | POA: Insufficient documentation

## 2014-04-24 LAB — BASIC METABOLIC PANEL
Anion gap: 12 (ref 5–15)
BUN: 17 mg/dL (ref 6–23)
CALCIUM: 9.6 mg/dL (ref 8.4–10.5)
CO2: 23 mEq/L (ref 19–32)
Chloride: 101 mEq/L (ref 96–112)
Creatinine, Ser: 1.19 mg/dL (ref 0.50–1.35)
GFR calc Af Amer: 79 mL/min — ABNORMAL LOW (ref >90)
GFR calc non Af Amer: 68 mL/min — ABNORMAL LOW (ref >90)
GLUCOSE: 99 mg/dL (ref 70–99)
Potassium: 4.4 mEq/L (ref 3.7–5.3)
SODIUM: 136 meq/L — AB (ref 137–147)

## 2014-04-25 NOTE — Progress Notes (Signed)
Reviewed chart with Dr Ivin Bootyrews. [facility]

## 2014-04-26 ENCOUNTER — Encounter (HOSPITAL_BASED_OUTPATIENT_CLINIC_OR_DEPARTMENT_OTHER): Payer: Self-pay | Admitting: Anesthesiology

## 2014-04-26 ENCOUNTER — Ambulatory Visit (HOSPITAL_BASED_OUTPATIENT_CLINIC_OR_DEPARTMENT_OTHER): Payer: Medicaid Other | Admitting: Anesthesiology

## 2014-04-26 ENCOUNTER — Ambulatory Visit (HOSPITAL_BASED_OUTPATIENT_CLINIC_OR_DEPARTMENT_OTHER)
Admission: RE | Admit: 2014-04-26 | Discharge: 2014-04-26 | Disposition: A | Discharge status: Home / Self Care | Payer: Medicaid Other | Source: Ambulatory Visit | Attending: Orthopedic Surgery | Admitting: Orthopedic Surgery

## 2014-04-26 ENCOUNTER — Encounter (HOSPITAL_BASED_OUTPATIENT_CLINIC_OR_DEPARTMENT_OTHER)
Admission: RE | Disposition: A | Discharge status: Home / Self Care | Payer: Self-pay | Source: Ambulatory Visit | Attending: Orthopedic Surgery

## 2014-04-26 DIAGNOSIS — K047 Periapical abscess without sinus: Secondary | ICD-10-CM | POA: Insufficient documentation

## 2014-04-26 DIAGNOSIS — M66861 Spontaneous rupture of other tendons, right lower leg: Secondary | ICD-10-CM | POA: Insufficient documentation

## 2014-04-26 DIAGNOSIS — R200 Anesthesia of skin: Secondary | ICD-10-CM | POA: Insufficient documentation

## 2014-04-26 DIAGNOSIS — I252 Old myocardial infarction: Secondary | ICD-10-CM | POA: Insufficient documentation

## 2014-04-26 DIAGNOSIS — I2510 Atherosclerotic heart disease of native coronary artery without angina pectoris: Secondary | ICD-10-CM | POA: Insufficient documentation

## 2014-04-26 DIAGNOSIS — I10 Essential (primary) hypertension: Secondary | ICD-10-CM | POA: Insufficient documentation

## 2014-04-26 DIAGNOSIS — R300 Dysuria: Secondary | ICD-10-CM | POA: Insufficient documentation

## 2014-04-26 HISTORY — PX: PATELLAR TENDON REPAIR: SHX737

## 2014-04-26 LAB — POCT HEMOGLOBIN-HEMACUE: Hemoglobin: 14 g/dL (ref 13.0–17.0)

## 2014-04-26 SURGERY — REPAIR, TENDON, PATELLAR
Anesthesia: General | Site: Knee | Laterality: Right

## 2014-04-26 MED ORDER — LIDOCAINE HCL (CARDIAC) 20 MG/ML IV SOLN
INTRAVENOUS | Status: DC | PRN
  Administered 2014-04-26: 40 mg via INTRAVENOUS

## 2014-04-26 MED ORDER — MIDAZOLAM HCL 2 MG/2ML IJ SOLN
INTRAMUSCULAR | Status: AC
  Filled 2014-04-26: qty 2

## 2014-04-26 MED ORDER — OXYCODONE HCL 5 MG/5ML PO SOLN: 5.0000 mg | Freq: Once | ORAL | Status: DC | PRN

## 2014-04-26 MED ORDER — ONDANSETRON HCL 4 MG PO TABS: 4.0000 mg | ORAL_TABLET | Freq: Three times a day (TID) | ORAL | Status: DC | PRN

## 2014-04-26 MED ORDER — PROPOFOL 10 MG/ML IV BOLUS
INTRAVENOUS | Status: DC | PRN
  Administered 2014-04-26: 160 mg via INTRAVENOUS
  Administered 2014-04-26: 40 mg via INTRAVENOUS

## 2014-04-26 MED ORDER — DEXAMETHASONE SODIUM PHOSPHATE 4 MG/ML IJ SOLN
INTRAMUSCULAR | Status: DC | PRN
  Administered 2014-04-26: 10 mg via INTRAVENOUS

## 2014-04-26 MED ORDER — MIDAZOLAM HCL 2 MG/2ML IJ SOLN
1.0000 mg | INTRAMUSCULAR | Status: DC | PRN
  Administered 2014-04-26: 2 mg via INTRAVENOUS

## 2014-04-26 MED ORDER — DOCUSATE SODIUM 100 MG PO CAPS: 100.0000 mg | ORAL_CAPSULE | Freq: Two times a day (BID) | ORAL | Status: DC

## 2014-04-26 MED ORDER — FENTANYL CITRATE 0.05 MG/ML IJ SOLN
50.0000 ug | INTRAMUSCULAR | Status: DC | PRN
  Administered 2014-04-26: 100 ug via INTRAVENOUS

## 2014-04-26 MED ORDER — GLYCOPYRROLATE 0.2 MG/ML IJ SOLN
INTRAMUSCULAR | Status: DC | PRN
  Administered 2014-04-26: 0.2 mg via INTRAVENOUS

## 2014-04-26 MED ORDER — DEXTROSE-NACL 5-0.45 % IV SOLN: 100.0000 mL/h | INTRAVENOUS | Status: DC

## 2014-04-26 MED ORDER — ONDANSETRON HCL 4 MG/2ML IJ SOLN
INTRAMUSCULAR | Status: DC | PRN
  Administered 2014-04-26: 4 mg via INTRAVENOUS

## 2014-04-26 MED ORDER — HYDROMORPHONE HCL 1 MG/ML IJ SOLN
0.2500 mg | INTRAMUSCULAR | Status: DC | PRN
  Administered 2014-04-26 (×4): 0.5 mg via INTRAVENOUS

## 2014-04-26 MED ORDER — CEFAZOLIN SODIUM-DEXTROSE 2-3 GM-% IV SOLR
INTRAVENOUS | Status: DC | PRN
  Administered 2014-04-26: 2 g via INTRAVENOUS

## 2014-04-26 MED ORDER — OXYCODONE HCL 5 MG PO TABS: 5.0000 mg | ORAL_TABLET | Freq: Once | ORAL | Status: DC | PRN

## 2014-04-26 MED ORDER — FENTANYL CITRATE 0.05 MG/ML IJ SOLN
INTRAMUSCULAR | Status: AC
  Filled 2014-04-26: qty 2

## 2014-04-26 MED ORDER — LACTATED RINGERS IV SOLN
INTRAVENOUS | Status: DC
  Administered 2014-04-26 (×2): via INTRAVENOUS

## 2014-04-26 MED ORDER — FENTANYL CITRATE 0.05 MG/ML IJ SOLN
INTRAMUSCULAR | Status: AC
  Filled 2014-04-26: qty 6

## 2014-04-26 MED ORDER — ACETAMINOPHEN 500 MG PO TABS: 1000.0000 mg | ORAL_TABLET | Freq: Once | ORAL | Status: DC

## 2014-04-26 MED ORDER — ACETAMINOPHEN 500 MG PO TABS
ORAL_TABLET | ORAL | Status: AC
  Filled 2014-04-26: qty 2

## 2014-04-26 MED ORDER — OXYCODONE-ACETAMINOPHEN 5-325 MG PO TABS: 2.0000 | ORAL_TABLET | ORAL | Status: DC | PRN

## 2014-04-26 MED ORDER — HYDROMORPHONE HCL 1 MG/ML IJ SOLN
INTRAMUSCULAR | Status: AC
Start: 2014-04-26 — End: 2014-04-26
  Filled 2014-04-26: qty 1

## 2014-04-26 MED ORDER — CEFAZOLIN SODIUM-DEXTROSE 2-3 GM-% IV SOLR: 2.0000 g | INTRAVENOUS | Status: DC

## 2014-04-26 MED ORDER — ONDANSETRON HCL 4 MG/2ML IJ SOLN: 4.0000 mg | Freq: Once | INTRAMUSCULAR | Status: DC | PRN

## 2014-04-26 MED ORDER — HYDROMORPHONE HCL 1 MG/ML IJ SOLN
INTRAMUSCULAR | Status: AC
  Filled 2014-04-26: qty 1

## 2014-04-26 MED ORDER — METHOCARBAMOL 500 MG PO TABS: 500.0000 mg | ORAL_TABLET | Freq: Four times a day (QID) | ORAL | Status: DC | PRN

## 2014-04-26 SURGICAL SUPPLY — 63 items
APL SKNCLS STERI-STRIP NONHPOA (GAUZE/BANDAGES/DRESSINGS)
BAG DECANTER FOR FLEXI CONT (MISCELLANEOUS) IMPLANT
BANDAGE ELASTIC 6 VELCRO ST LF (GAUZE/BANDAGES/DRESSINGS) ×3 IMPLANT
BANDAGE ESMARK 6X9 LF (GAUZE/BANDAGES/DRESSINGS) ×1 IMPLANT
BENZOIN TINCTURE PRP APPL 2/3 (GAUZE/BANDAGES/DRESSINGS) IMPLANT
BLADE SURG 15 STRL LF DISP TIS (BLADE) ×1 IMPLANT
BLADE SURG 15 STRL SS (BLADE) ×6
BNDG CMPR 9X6 STRL LF SNTH (GAUZE/BANDAGES/DRESSINGS) ×1
BNDG ESMARK 6X9 LF (GAUZE/BANDAGES/DRESSINGS) ×3
CANISTER SUCT 1200ML W/VALVE (MISCELLANEOUS) IMPLANT
CHLORAPREP W/TINT 26ML (MISCELLANEOUS) ×3 IMPLANT
CLOSURE WOUND 1/2 X4 (GAUZE/BANDAGES/DRESSINGS)
CUFF TOURNIQUET SINGLE 34IN LL (TOURNIQUET CUFF) ×2 IMPLANT
DECANTER SPIKE VIAL GLASS SM (MISCELLANEOUS) IMPLANT
DRAPE EXTREMITY T 121X128X90 (DRAPE) ×3 IMPLANT
DRAPE OEC MINIVIEW 54X84 (DRAPES) ×1 IMPLANT
DRAPE U-SHAPE 47X51 STRL (DRAPES) ×3 IMPLANT
ELECT REM PT RETURN 9FT ADLT (ELECTROSURGICAL) ×3
ELECTRODE REM PT RTRN 9FT ADLT (ELECTROSURGICAL) ×1 IMPLANT
GAUZE SPONGE 4X4 12PLY STRL (GAUZE/BANDAGES/DRESSINGS) ×3 IMPLANT
GAUZE XEROFORM 1X8 LF (GAUZE/BANDAGES/DRESSINGS) IMPLANT
GLOVE BIO SURGEON STRL SZ 6.5 (GLOVE) ×1 IMPLANT
GLOVE BIO SURGEON STRL SZ7.5 (GLOVE) ×3 IMPLANT
GLOVE BIO SURGEONS STRL SZ 6.5 (GLOVE) ×1
GLOVE BIOGEL PI IND STRL 7.0 (GLOVE) IMPLANT
GLOVE BIOGEL PI IND STRL 8 (GLOVE) ×1 IMPLANT
GLOVE BIOGEL PI INDICATOR 7.0 (GLOVE) ×4
GLOVE BIOGEL PI INDICATOR 8 (GLOVE) ×2
GOWN STRL REUS W/ TWL LRG LVL3 (GOWN DISPOSABLE) ×2 IMPLANT
GOWN STRL REUS W/TWL LRG LVL3 (GOWN DISPOSABLE) ×6
IMMOBILIZER KNEE 22 UNIV (SOFTGOODS) IMPLANT
IMMOBILIZER KNEE 24 THIGH 36 (MISCELLANEOUS) ×1 IMPLANT
IMMOBILIZER KNEE 24 UNIV (MISCELLANEOUS) ×3
NDL SUT 6 .5 CRC .975X.05 MAYO (NEEDLE) IMPLANT
NEEDLE MAYO TAPER (NEEDLE)
NS IRRIG 1000ML POUR BTL (IV SOLUTION) ×3 IMPLANT
PACK ARTHROSCOPY DSU (CUSTOM PROCEDURE TRAY) ×3 IMPLANT
PACK BASIN DAY SURGERY FS (CUSTOM PROCEDURE TRAY) ×3 IMPLANT
PADDING CAST COTTON 6X4 STRL (CAST SUPPLIES) ×3 IMPLANT
PENCIL BUTTON HOLSTER BLD 10FT (ELECTRODE) ×3 IMPLANT
SLEEVE SCD COMPRESS KNEE MED (MISCELLANEOUS) ×2 IMPLANT
SPONGE LAP 4X18 X RAY DECT (DISPOSABLE) ×1 IMPLANT
STRIP CLOSURE SKIN 1/2X4 (GAUZE/BANDAGES/DRESSINGS) IMPLANT
SUCTION FRAZIER TIP 10 FR DISP (SUCTIONS) ×2 IMPLANT
SUT ETHILON 3 0 PS 1 (SUTURE) ×3 IMPLANT
SUT FIBERWIRE #2 38 T-5 BLUE (SUTURE) ×6
SUT MNCRL AB 4-0 PS2 18 (SUTURE) ×2 IMPLANT
SUT MON AB 2-0 CT1 36 (SUTURE) ×2 IMPLANT
SUT PDS AB 0 CT 36 (SUTURE) ×2 IMPLANT
SUT VIC AB 0 CT1 27 (SUTURE)
SUT VIC AB 0 CT1 27XBRD ANBCTR (SUTURE) IMPLANT
SUT VIC AB 0 SH 27 (SUTURE) IMPLANT
SUT VIC AB 2-0 SH 27 (SUTURE)
SUT VIC AB 2-0 SH 27XBRD (SUTURE) IMPLANT
SUT VIC AB 3-0 SH 27 (SUTURE)
SUT VIC AB 3-0 SH 27X BRD (SUTURE) ×1 IMPLANT
SUTURE FIBERWR #2 38 T-5 BLUE (SUTURE) IMPLANT
SYR BULB 3OZ (MISCELLANEOUS) ×3 IMPLANT
TOWEL OR 17X24 6PK STRL BLUE (TOWEL DISPOSABLE) ×3 IMPLANT
TOWEL OR NON WOVEN STRL DISP B (DISPOSABLE) ×3 IMPLANT
TUBE CONNECTING 20'X1/4 (TUBING) ×1
TUBE CONNECTING 20X1/4 (TUBING) ×1 IMPLANT
YANKAUER SUCT BULB TIP NO VENT (SUCTIONS) IMPLANT

## 2014-04-26 NOTE — Interval H&P Note (Signed)
History and Physical Interval Note:  04/26/2014 12:39 PM  Barbarann EhlersGary S Woolridge  has presented today for surgery, with the diagnosis of right knee  The various methods of treatment have been discussed with the patient and family. After consideration of risks, benefits and other options for treatment, the patient has consented to  Procedure(s): RIGHT PATELLA TENDON REPAIR (Right) as a surgical intervention .  The patient's history has been reviewed, patient examined, no change in status, stable for surgery.  I have reviewed the patient's chart and labs.  Questions were answered to the patient's satisfaction.     MURPHY, TIMOTHY, D

## 2014-04-26 NOTE — Transfer of Care (Signed)
Immediate Anesthesia Transfer of Care Note  Patient: Michael EhlersGary S Salser  Procedure(s) Performed: Procedure(s): RIGHT PATELLA TENDON REPAIR (Right)  Patient Location: PACU  Anesthesia Type:GA combined with regional for post-op pain  Level of Consciousness: awake, sedated and patient cooperative  Airway & Oxygen Therapy: Patient Spontanous Breathing and Patient connected to face mask oxygen  Post-op Assessment: Report given to PACU RN and Post -op Vital signs reviewed and stable  Post vital signs: Reviewed and stable  Complications: No apparent anesthesia complications

## 2014-04-26 NOTE — Discharge Instructions (Signed)
Keep dressing on until follow up  You may bear weight in your brace  Wear your brace full time, even while sleeping  Continue your aspirin    Post Anesthesia Home Care Instructions  Activity: Get plenty of rest for the remainder of the day. A responsible adult should stay with you for 24 hours following the procedure.  For the next 24 hours, DO NOT: -Drive a car -Advertising copywriterperate machinery -Drink alcoholic beverages -Take any medication unless instructed by your physician -Make any legal decisions or sign important papers.  Meals: Start with liquid foods such as gelatin or soup. Progress to regular foods as tolerated. Avoid greasy, spicy, heavy foods. If nausea and/or vomiting occur, drink only clear liquids until the nausea and/or vomiting subsides. Call your physician if vomiting continues.  Special Instructions/Symptoms: Your throat may feel dry or sore from the anesthesia or the breathing tube placed in your throat during surgery. If this causes discomfort, gargle with warm salt water. The discomfort should disappear within 24 hours.   Regional Anesthesia Blocks  1. Numbness or the inability to move the "blocked" extremity may last from 3-48 hours after placement. The length of time depends on the medication injected and your individual response to the medication. If the numbness is not going away after 48 hours, call your surgeon.  2. The extremity that is blocked will need to be protected until the numbness is gone and the  Strength has returned. Because you cannot feel it, you will need to take extra care to avoid injury. Because it may be weak, you may have difficulty moving it or using it. You may not know what position it is in without looking at it while the block is in effect.  3. For blocks in the legs and feet, returning to weight bearing and walking needs to be done carefully. You will need to wait until the numbness is entirely gone and the strength has returned. You should be  able to move your leg and foot normally before you try and bear weight or walk. You will need someone to be with you when you first try to ensure you do not fall and possibly risk injury.  4. Bruising and tenderness at the needle site are common side effects and will resolve in a few days.  5. Persistent numbness or new problems with movement should be communicated to the surgeon or the [facility] [phone])/ [facility] [phone]).

## 2014-04-26 NOTE — Op Note (Signed)
04/26/2014  2:16 PM  PATIENT:  Michael [name]    PRE-OPERATIVE DIAGNOSIS:  right knee patella tendon rupture  POST-OPERATIVE DIAGNOSIS:  Same  PROCEDURE:  RIGHT PATELLA TENDON REPAIR  SURGEON:  MURPHY, TIMOTHY, D, MD  ASSISTANT: Janace LittenBrandon Parry, OPA, He was necessary for efficiency and safety of the case.   ANESTHESIA:   gen/block  PREOPERATIVE INDICATIONS:  Michael [name] is a  53 y.o. male with a diagnosis of right knee patella tendon rupture who failed conservative measures and elected for surgical management.    The risks benefits and alternatives were discussed with the patient preoperatively including but not limited to the risks of infection, bleeding, nerve injury, cardiopulmonary complications, the need for revision surgery, among others, and the patient was willing to proceed.  OPERATIVE IMPLANTS: fiberwire  OPERATIVE FINDINGS: stable repair to 45 degrees  BLOOD LOSS: min  COMPLICATIONS: none  TOURNIQUET TIME: 45min  OPERATIVE PROCEDURE:  Patient was identified in the preoperative holding area and site was marked by me He was transported to the operating theater and placed on the table in supine position taking care to pad all bony prominences. After a preincinduction time out anesthesia was induced. The right lower extremity was prepped and draped in normal sterile fashion and a pre-incision timeout was performed. He received ancef for preoperative antibiotics.   After prepping and draping I covered a small abrasion on his anterior knee with a Tegaderm. I then made a roughly 7 cm incision centered over his knee joint. I dissected down to his peritenon which was ruptured in the central portion I then incised this longitudinally in line with the incision. I noted complete rupture of his patellar tendon.  The medial and lateral capsule were also ruptured with the tendon.  I debrided diseased tendon from the end of the patella and left good bony surface for tendon  healing. I then used a #2 FiberWire whipstitch up and back in the healthy tendon as it came off of the tibial tubercle.  I then used a drill bit to drill 3 drill tunnels in the patella taking care to diverge them and also to not penetrate the articular surface. I used a [place] suture passer to pass the FiberWire through these holes.  I then placed his knee in extension and tied the sutures over the superior aspect of the patella tendon.  I then flexed the knee to 45 with no gapping of the repair. I then used a 0 Vicryl to close the capsule and peritenon on either sides of the ruptured patella and then closed the peritenon incision in line with the previous incision.  I did this after thoroughly irrigating the wound.  I then closed the skin in layers with absorbable stitch. Placed him in a knee immobilizer with a sterile dressing and taken to the PACU in stable condition.  POST OPERATIVE PLAN: WBAT in splint. Wear splint full time. Continue ASA and mobilize for DVT px.     This note was generated using a template and dragon dictation system. In light of that, I have reviewed the note and all aspects of it are applicable to this case. Any dictation errors are due to the computerized dictation system.

## 2014-04-26 NOTE — Anesthesia Procedure Notes (Signed)
Procedure Name: LMA Insertion Date/Time: 04/26/2014 1:04 PM Performed by: Genevieve NorlanderLINKA, Shahla Betsill L Pre-anesthesia Checklist: Patient identified, Emergency Drugs available, Suction available, Patient being monitored and Timeout performed Patient Re-evaluated:Patient Re-evaluated prior to inductionOxygen Delivery Method: Circle System Utilized Preoxygenation: Pre-oxygenation with 100% oxygen Intubation Type: IV induction Ventilation: Mask ventilation without difficulty LMA: LMA inserted LMA Size: 5.0 Number of attempts: 1 Airway Equipment and Method: bite block Placement Confirmation: positive ETCO2 Tube secured with: Tape Dental Injury: Teeth and Oropharynx as per pre-operative assessment

## 2014-04-26 NOTE — Progress Notes (Signed)
Assisted Dr. Crews with right, ultrasound guided, femoral block. Side rails up, monitors on throughout procedure. See vital signs in flow sheet. Tolerated Procedure well. 

## 2014-04-26 NOTE — Anesthesia Preprocedure Evaluation (Signed)
Anesthesia Evaluation  Patient identified by MRN, date of birth, ID band Patient awake    Reviewed: Allergy & Precautions, H&P , NPO status , Patient's Chart, lab work & pertinent test results, reviewed documented beta blocker date and time   Airway Mallampati: I  TM Distance: >3 FB Neck ROM: Full    Dental  (+) Teeth Intact, Dental Advisory Given   Pulmonary  breath sounds clear to auscultation        Cardiovascular hypertension, Pt. on medications and Pt. on home beta blockers + CAD and + Past MI Rhythm:Regular Rate:Normal     Neuro/Psych    GI/Hepatic   Endo/Other  Morbid obesity  Renal/GU      Musculoskeletal   Abdominal   Peds  Hematology   Anesthesia Other Findings   Reproductive/Obstetrics                             Anesthesia Physical Anesthesia Plan  ASA: III  Anesthesia Plan: General   Post-op Pain Management:    Induction: Intravenous  Airway Management Planned: LMA  Additional Equipment:   Intra-op Plan:   Post-operative Plan: Extubation in OR  Informed Consent: I have reviewed the patients History and Physical, chart, labs and discussed the procedure including the risks, benefits and alternatives for the proposed anesthesia with the patient or authorized representative who has indicated his/her understanding and acceptance.   Dental advisory given  Plan Discussed with: CRNA, Anesthesiologist and Surgeon  Anesthesia Plan Comments:         Anesthesia Quick Evaluation

## 2014-04-26 NOTE — Anesthesia Postprocedure Evaluation (Signed)
  Anesthesia Post-op Note  Patient: Michael [name]  Procedure(s) Performed: Procedure(s): RIGHT PATELLA TENDON REPAIR (Right)  Patient Location: PACU  Anesthesia Type: General   Level of Consciousness: awake, alert  and oriented  Airway and Oxygen Therapy: Patient Spontanous Breathing  Post-op Pain: mild  Post-op Assessment: Post-op Vital signs reviewed  Post-op Vital Signs: Reviewed  Last Vitals:  Filed Vitals:   04/26/14 1515  BP: 146/97  Pulse: 69  Temp:   Resp: 16    Complications: No apparent anesthesia complications

## 2014-04-29 ENCOUNTER — Encounter (HOSPITAL_BASED_OUTPATIENT_CLINIC_OR_DEPARTMENT_OTHER): Payer: Self-pay | Admitting: Orthopedic Surgery

## 2014-05-16 ENCOUNTER — Encounter: Payer: Self-pay | Admitting: Internal Medicine

## 2014-05-16 ENCOUNTER — Ambulatory Visit: Payer: Medicaid Other | Attending: Internal Medicine | Admitting: Internal Medicine

## 2014-05-16 VITALS — BP 138/85 | HR 60 | Temp 97.9°F | Resp 16 | Ht 68.0 in | Wt 224.0 lb

## 2014-05-16 DIAGNOSIS — Z79899 Other long term (current) drug therapy: Secondary | ICD-10-CM | POA: Insufficient documentation

## 2014-05-16 DIAGNOSIS — I10 Essential (primary) hypertension: Secondary | ICD-10-CM

## 2014-05-16 DIAGNOSIS — M5022 Other cervical disc displacement, mid-cervical region: Secondary | ICD-10-CM | POA: Insufficient documentation

## 2014-05-16 DIAGNOSIS — M4802 Spinal stenosis, cervical region: Secondary | ICD-10-CM | POA: Insufficient documentation

## 2014-05-16 DIAGNOSIS — I252 Old myocardial infarction: Secondary | ICD-10-CM | POA: Insufficient documentation

## 2014-05-16 DIAGNOSIS — N289 Disorder of kidney and ureter, unspecified: Secondary | ICD-10-CM | POA: Insufficient documentation

## 2014-05-16 DIAGNOSIS — F1010 Alcohol abuse, uncomplicated: Secondary | ICD-10-CM | POA: Insufficient documentation

## 2014-05-16 DIAGNOSIS — I2510 Atherosclerotic heart disease of native coronary artery without angina pectoris: Secondary | ICD-10-CM | POA: Insufficient documentation

## 2014-05-16 DIAGNOSIS — Z720 Tobacco use: Secondary | ICD-10-CM | POA: Insufficient documentation

## 2014-05-16 DIAGNOSIS — Z7982 Long term (current) use of aspirin: Secondary | ICD-10-CM | POA: Insufficient documentation

## 2014-05-16 DIAGNOSIS — Z1211 Encounter for screening for malignant neoplasm of colon: Secondary | ICD-10-CM

## 2014-05-16 HISTORY — DX: Essential (primary) hypertension: I10

## 2014-05-16 NOTE — Progress Notes (Signed)
Patient ID: Michael [name], male   DOB: 06/20/1960, 53 y.o.   MRN: [id number]   Michael [name], is a 53 y.o. male  [id number]  [id number]  DOB - 11/11/1960  Chief Complaint  Patient presents with  . Follow-up        Subjective:   Michael [name] is a 53 y.o. male here today for a follow up visit. Patient has history of coronary artery disease, hypertension, renal insufficiency, tobacco abuse and alcohol abuse Patient recently had right patellar tendon rupture status post surgical repair. He is here today for follow-up. His major complaint today is pain in his back, knees and arms. Pt states that due to a pinched nerves his hands has tingling and numbness causing him to drop things. 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 There is right foraminal encroachment and mild spinal stenosis". Patient had been referred to neurosurgery for possible evaluation and treatment, but patient could not afford the visit because he needed to pay $250 for consultation. But now patient has [facility] and orange card and wonders if the neurosurgery referral can be revisited. Patient has No headache, No chest pain, No abdominal pain - No Nausea, No Cough - SOB.  Problem  Cervical Stenosis of Spine  Essential Hypertension    ALLERGIES: Allergies  Allergen Reactions  . Ivp Dye [Iodinated Diagnostic Agents] Swelling    PAST MEDICAL HISTORY: Past Medical History  Diagnosis Date  . Anterior myocardial infarction     ST-elevation; S/P emergent  drug-eluting stenting of proximal left anterior descending  . Renal insufficiency   . Hypertension   . Alcohol abuse   . Coronary artery disease     Cath October 2012-moderate disease in RCA, Circumflex    MEDICATIONS AT HOME: Prior to Admission medications   Medication Sig Start Date End Date Taking? Authorizing Provider  acetaminophen (TYLENOL) 500 MG chewable  tablet Chew 500 mg by mouth [name] 6 (six) hours as needed for pain.   Yes Historical Provider, MD  aspirin EC 81 MG tablet Take 81 mg by mouth daily.     Yes Historical Provider, MD  docusate sodium (COLACE) 100 MG capsule Take 1 capsule (100 mg total) by mouth 2 (two) times daily. Continue this while taking narcotics to help with bowel movements 04/26/14  Yes Sheral Apleyimothy D Murphy, MD  lisinopril (PRINIVIL,ZESTRIL) 20 MG tablet TAKE ONE TABLET BY MOUTH ONCE DAILY 01/14/14  Yes Kathleene Hazelhristopher D McAlhany, MD  lovastatin (MEVACOR) 20 MG tablet Take 1 tablet (20 mg total) by mouth at bedtime. 01/14/14  Yes Kathleene Hazelhristopher D McAlhany, MD  methocarbamol (ROBAXIN) 500 MG tablet Take 1 tablet (500 mg total) by mouth [name] 6 (six) hours as needed. 04/26/14  Yes Sheral Apleyimothy D Murphy, MD  metoprolol (LOPRESSOR) 50 MG tablet TAKE ONE TABLET BY MOUTH TWICE DAILY 01/14/14  Yes Kathleene Hazelhristopher D McAlhany, MD  nitroGLYCERIN (NITROSTAT) 0.4 MG SL tablet Place 1 tablet (0.4 mg total) under the tongue [name] 5 (five) minutes as needed. 06/26/12  Yes Kathleene Hazelhristopher D McAlhany, MD  ondansetron (ZOFRAN) 4 MG tablet Take 1 tablet (4 mg total) by mouth [name] 8 (eight) hours as needed for nausea. 04/26/14  Yes Sheral Apleyimothy D Murphy, MD  oxyCODONE-acetaminophen (ROXICET) 5-325 MG per tablet Take 2 tablets by mouth [name] 4 (four) hours as needed. 04/26/14  Yes Sheral Apleyimothy D Murphy, MD  traMADol (ULTRAM) 50 MG tablet Take 1 tablet (50 mg total) by mouth [name]  6 (six) hours as needed. 04/11/14  Yes Graylon GoodZachary H Baker, PA-C     Objective:   Filed Vitals:   05/16/14 1438  BP: 138/85  Pulse: 60  Temp: 97.9 F (36.6 C)  TempSrc: Oral  Resp: 16  Height: 5\' 8"  (1.727 m)  Weight: 224 lb (101.606 kg)  SpO2: 98%    Exam General appearance : Awake, alert, not in any distress. Speech Clear. Not toxic looking HEENT: Atraumatic and Normocephalic, pupils equally reactive to light and accomodation Neck: supple, no JVD. No cervical lymphadenopathy.  Chest:Good air  entry bilaterally, no added sounds  CVS: S1 S2 regular, no murmurs.  Abdomen: Bowel sounds present, Non tender and not distended with no gaurding, rigidity or rebound. Extremities: B/L Lower Ext shows no edema, both legs are warm to touch Neurology: Awake alert, and oriented X 3, CN II-XII intact, Non focal Skin:No Rash Wounds:N/A  Data Review Lab Results  Component Value Date   HGBA1C 5.6 05/18/2013     Assessment & Plan   1. Cervical stenosis of spine  - Ambulatory referral to Neurosurgery  2. Essential hypertension - Continue lisinopril 20 mg tablet by mouth daily - DASH Diet  3. Colon cancer screening  - HM COLONOSCOPY - Ambulatory referral to Gastroenterology   Patient was extensively counseled about nutrition and exercise   Return in about 3 months (around 08/15/2014) for Follow up Pain and comorbidities, Annual Physical.  The patient was given clear instructions to go to ER or return to medical center if symptoms don't improve, worsen or new problems develop. The patient verbalized understanding. The patient was told to call to get lab results if they haven't heard anything in the next week.   This note has been created with Education officer, environmentalDragon speech recognition software and smart phrase technology. Any transcriptional errors are unintentional.    Michael [name], Michael Worthey, MD, MHA, FACP, FAAP [facility] [place], [place] [phone]   05/16/2014, 3:29 PM

## 2014-05-16 NOTE — Progress Notes (Signed)
Pt is here today to consult with the doctor about his pain in his back, knees and arms. Pt states that due to a pinched nerves his hands has tingling and numbness causing him to drop things.

## 2014-05-16 NOTE — Patient Instructions (Signed)
Cervical Radiculopathy Cervical radiculopathy happens when a nerve in the neck is pinched or bruised by a slipped (herniated) disk or by arthritic changes in the bones of the cervical spine. This can occur due to an injury or as part of the normal aging process. Pressure on the cervical nerves can cause pain or numbness that runs from your neck all the way down into your arm and fingers. CAUSES  There are many possible causes, including:  Injury.  Muscle tightness in the neck from overuse.  Swollen, painful joints (arthritis).  Breakdown or degeneration in the bones and joints of the spine (spondylosis) due to aging.  Bone spurs that may develop near the cervical nerves. SYMPTOMS  Symptoms include pain, weakness, or numbness in the affected arm and hand. Pain can be severe or irritating. Symptoms may be worse when extending or turning the neck. DIAGNOSIS  Your caregiver will ask about your symptoms and do a physical exam. He or she may test your strength and reflexes. X-rays, CT scans, and MRI scans may be needed in cases of injury or if the symptoms do not go away after a period of time. Electromyography (EMG) or nerve conduction testing may be done to study how your nerves and muscles are working. TREATMENT  Your caregiver may recommend certain exercises to help relieve your symptoms. Cervical radiculopathy can, and often does, get better with time and treatment. If your problems continue, treatment options may include:  Wearing a soft collar for short periods of time.  Physical therapy to strengthen the neck muscles.  Medicines, such as nonsteroidal anti-inflammatory drugs (NSAIDs), oral corticosteroids, or spinal injections.  Surgery. Different types of surgery may be done depending on the cause of your problems. HOME CARE INSTRUCTIONS   Put ice on the affected area.  Put ice in a plastic bag.  Place a towel between your skin and the bag.  Leave the ice on for 15-20 minutes,  03-04 times a day or as directed by your caregiver.  If ice does not help, you can try using heat. Take a warm shower or bath, or use a hot water bottle as directed by your caregiver.  You may try a gentle neck and shoulder massage.  Use a flat pillow when you sleep.  Only take over-the-counter or prescription medicines for pain, discomfort, or fever as directed by your caregiver.  If physical therapy was prescribed, follow your caregiver's directions.  If a soft collar was prescribed, use it as directed. SEEK IMMEDIATE MEDICAL CARE IF:   Your pain gets much worse and cannot be controlled with medicines.  You have weakness or numbness in your hand, arm, face, or leg.  You have a high fever or a stiff, rigid neck.  You lose bowel or bladder control (incontinence).  You have trouble with walking, balance, or speaking. MAKE SURE YOU:   Understand these instructions.  Will watch your condition.  Will get help right away if you are not doing well or get worse. Document Released: 02/23/2001 Document Revised: 08/23/2011 Document Reviewed: 01/12/2011 [facility] Patient Information 2015 [place], [place]. This information is not intended to replace advice given to you by your health care provider. Make sure you discuss any questions you have with your health care provider. Knee Pain The knee is the complex joint between your thigh and your lower leg. It is made up of bones, tendons, ligaments, and cartilage. The bones that make up the knee are:  The femur in the thigh.  The tibia  and fibula in the lower leg.  The patella or kneecap riding in the groove on the lower femur. CAUSES  Knee pain is a common complaint with many causes. A few of these causes are:  Injury, such as:  A ruptured ligament or tendon injury.  Torn cartilage.  Medical conditions, such as:  Gout  Arthritis  Infections  Overuse, over training, or overdoing a physical activity. Knee pain can be minor or  severe. Knee pain can accompany debilitating injury. Minor knee problems often respond well to self-care measures or get well on their own. More serious injuries may need medical intervention or even surgery. SYMPTOMS The knee is complex. Symptoms of knee problems can vary widely. Some of the problems are:  Pain with movement and weight bearing.  Swelling and tenderness.  Buckling of the knee.  Inability to straighten or extend your knee.  Your knee locks and you cannot straighten it.  Warmth and redness with pain and fever.  Deformity or dislocation of the kneecap. DIAGNOSIS  Determining what is wrong may be very straight forward such as when there is an injury. It can also be challenging because of the complexity of the knee. Tests to make a diagnosis may include:  Your caregiver taking a history and doing a physical exam.  Routine X-rays can be used to rule out other problems. X-rays will not reveal a cartilage tear. Some injuries of the knee can be diagnosed by:  Arthroscopy a surgical technique by which a small video camera is inserted through tiny incisions on the sides of the knee. This procedure is used to examine and repair internal knee joint problems. Tiny instruments can be used during arthroscopy to repair the torn knee cartilage (meniscus).  Arthrography is a radiology technique. A contrast liquid is directly injected into the knee joint. Internal structures of the knee joint then become visible on X-ray film.  An MRI scan is a non X-ray radiology procedure in which magnetic fields and a computer produce two- or three-dimensional images of the inside of the knee. Cartilage tears are often visible using an MRI scanner. MRI scans have largely replaced arthrography in diagnosing cartilage tears of the knee.  Blood work.  Examination of the fluid that helps to lubricate the knee joint (synovial fluid). This is done by taking a sample out using a needle and a  syringe. TREATMENT The treatment of knee problems depends on the cause. Some of these treatments are:  Depending on the injury, proper casting, splinting, surgery, or physical therapy care will be needed.  Give yourself adequate recovery time. Do not overuse your joints. If you begin to get sore during workout routines, back off. Slow down or do fewer repetitions.  For repetitive activities such as cycling or running, maintain your strength and nutrition.  Alternate muscle groups. For example, if you are a weight lifter, work the upper body on one day and the lower body the next.  Either tight or weak muscles do not give the proper support for your knee. Tight or weak muscles do not absorb the stress placed on the knee joint. Keep the muscles surrounding the knee strong.  Take care of mechanical problems.  If you have flat feet, orthotics or special shoes may help. See your caregiver if you need help.  Arch supports, sometimes with wedges on the inner or outer aspect of the heel, can help. These can shift pressure away from the side of the knee most bothered by osteoarthritis.  A  brace called an "unloader" brace also may be used to help ease the pressure on the most arthritic side of the knee.  If your caregiver has prescribed crutches, braces, wraps or ice, use as directed. The acronym for this is PRICE. This means protection, rest, ice, compression, and elevation.  Nonsteroidal anti-inflammatory drugs (NSAIDs), can help relieve pain. But if taken immediately after an injury, they may actually increase swelling. Take NSAIDs with food in your stomach. Stop them if you develop stomach problems. Do not take these if you have a history of ulcers, stomach pain, or bleeding from the bowel. Do not take without your caregiver's approval if you have problems with fluid retention, heart failure, or kidney problems.  For ongoing knee problems, physical therapy may be helpful.  Glucosamine and  chondroitin are over-the-counter dietary supplements. Both may help relieve the pain of osteoarthritis in the knee. These medicines are different from the usual anti-inflammatory drugs. Glucosamine may decrease the rate of cartilage destruction.  Injections of a corticosteroid drug into your knee joint may help reduce the symptoms of an arthritis flare-up. They may provide pain relief that lasts a few months. You may have to wait a few months between injections. The injections do have a small increased risk of infection, water retention, and elevated blood sugar levels.  Hyaluronic acid injected into damaged joints may ease pain and provide lubrication. These injections may work by reducing inflammation. A series of shots may give relief for as long as 6 months.  Topical painkillers. Applying certain ointments to your skin may help relieve the pain and stiffness of osteoarthritis. Ask your pharmacist for suggestions. Many over the-counter products are approved for temporary relief of arthritis pain.  In some countries, doctors often prescribe topical NSAIDs for relief of chronic conditions such as arthritis and tendinitis. A review of treatment with NSAID creams found that they worked as well as oral medications but without the serious side effects. PREVENTION  Maintain a healthy weight. Extra pounds put more strain on your joints.  Get strong, stay limber. Weak muscles are a common cause of knee injuries. Stretching is important. Include flexibility exercises in your workouts.  Be smart about exercise. If you have osteoarthritis, chronic knee pain or recurring injuries, you may need to change the way you exercise. This does not mean you have to stop being active. If your knees ache after jogging or playing basketball, consider switching to swimming, water aerobics, or other low-impact activities, at least for a few days a week. Sometimes limiting high-impact activities will provide relief.  Make  sure your shoes fit well. Choose footwear that is right for your sport.  Protect your knees. Use the proper gear for knee-sensitive activities. Use kneepads when playing volleyball or laying carpet. Buckle your seat belt every time you drive. Most shattered kneecaps occur in car accidents.  Rest when you are tired. SEEK MEDICAL CARE IF:  You have knee pain that is continual and does not seem to be getting better.  SEEK IMMEDIATE MEDICAL CARE IF:  Your knee joint feels hot to the touch and you have a high fever. MAKE SURE YOU:   Understand these instructions.  Will watch your condition.  Will get help right away if you are not doing well or get worse. Document Released: 03/28/2007 Document Revised: 08/23/2011 Document Reviewed: 03/28/2007 [facility] Patient Information 2015 [place]. This information is not intended to replace advice given to you by your health care provider. Make sure you discuss any  questions you have with your health care provider.  

## 2014-06-12 ENCOUNTER — Telehealth: Payer: Self-pay

## 2014-06-12 ENCOUNTER — Other Ambulatory Visit: Payer: Self-pay

## 2014-06-12 DIAGNOSIS — Z1211 Encounter for screening for malignant neoplasm of colon: Secondary | ICD-10-CM

## 2014-06-12 NOTE — Telephone Encounter (Signed)
REVIEWED. MOVI PREP SPLIT DOSING, FULL LIQUIDS WITH BREAKFAST.   Full Liquid Diet A high-calorie, high-protein supplement should be used to meet your nutritional requirements when the full liquid diet is continued for more than 2 or 3 days. If this diet is to be used for an extended period of time (more than 7 days), a multivitamin should be considered.  Breads and Starches  Allowed: None are allowed except crackers WHOLE OR pureed (made into a thick, smooth soup) in soup.   Avoid: Any others.    Potatoes/Pasta/Rice  Allowed: ANY ITEM AS A SOUP OR SMALL PLATE OF MASHED POTATOES.       Vegetables  Allowed: Strained tomato or vegetable juice. Vegetables pureed in soup.   Avoid: Any others.    Fruit  Allowed: Any strained fruit juices and fruit drinks. Include 1 serving of citrus or vitamin C-enriched fruit juice daily.   Avoid: Any others.  Meat and Meat Substitutes  Allowed: Egg  Avoid: Any meat, fish, or fowl. All cheese.  Milk  Allowed: Milk beverages, including milk shakes and instant breakfast mixes. Smooth yogurt.   Avoid: Any others. Avoid dairy products if not tolerated.    Soups and Combination Foods  Allowed: Broth, strained cream soups. Strained, broth-based soups.   Avoid: Any others.    Desserts and Sweets  Allowed: flavored gelatin,plain ice cream, sherbet, smooth pudding, junket, fruit ices, frozen ice pops, pudding pops,, frozen fudge pops, chocolate syrup. Sugar, honey, jelly, syrup.   Avoid: Any others.  Fats and Oils  Allowed: Margarine, butter, cream, sour cream, oils.   Avoid: Any others.  Beverages  Allowed: All.   Avoid: None.  Condiments  Allowed: Iodized salt, pepper, spices, flavorings. Cocoa powder.   Avoid: Any others.    SAMPLE MEAL PLAN Breakfast   cup orange juice.   1 OR 2 EGGS   1 cup  milk.   1 cup beverage (coffee or tea).   Cream or sugar, if desired.    Midmorning Snack  2 SCRAMBLED OR HARD  BOILED EGG   Lunch  1 cup cream soup.    cup fruit juice.   1 cup milk.    cup custard.   1 cup beverage (coffee or tea).   Cream or sugar, if desired.    Midafternoon Snack  1 cup milk shake.  Dinner  1 cup cream soup.    cup fruit juice.   1 cup milk.    cup pudding.   1 cup beverage (coffee or tea).   Cream or sugar, if desired.  Evening Snack  1 cup supplement.  To increase calories, add sugar, cream, butter, or margarine if possible. Nutritional supplements will also increase the total calories.

## 2014-06-12 NOTE — Telephone Encounter (Signed)
Gastroenterology Pre-Procedure Review  Request Date:06/12/2014 Requesting Physician: Dr. Hyman HopesJegede  PATIENT REVIEW QUESTIONS: The patient responded to the following health history questions as indicated:    1. Diabetes Melitis: no 2. Joint replacements in the past 12 months: no 3. Major health problems in the past 3 months: no 4. Has an artificial valve or MVP: no 5. Has a defibrillator: no 6. Has been advised in past to take antibiotics in advance of a procedure like teeth cleaning: no    MEDICATIONS & ALLERGIES:    Patient reports the following regarding taking any blood thinners:   Plavix? no Aspirin? no Coumadin? no  Patient confirms/reports the following medications:  Current Outpatient Prescriptions  Medication Sig Dispense Refill  . acetaminophen (TYLENOL) 500 MG chewable tablet Chew 500 mg by mouth every 6 (six) hours as needed for pain.    Marland Kitchen. lisinopril (PRINIVIL,ZESTRIL) 20 MG tablet TAKE ONE TABLET BY MOUTH ONCE DAILY 30 tablet 11  . lovastatin (MEVACOR) 20 MG tablet Take 1 tablet (20 mg total) by mouth at bedtime. 30 tablet 11  . metoprolol (LOPRESSOR) 50 MG tablet TAKE ONE TABLET BY MOUTH TWICE DAILY 60 tablet 11  . oxyCODONE-acetaminophen (ROXICET) 5-325 MG per tablet Take 2 tablets by mouth every 4 (four) hours as needed. 90 tablet 0  . aspirin EC 81 MG tablet Take 81 mg by mouth daily.      Marland Kitchen. docusate sodium (COLACE) 100 MG capsule Take 1 capsule (100 mg total) by mouth 2 (two) times daily. Continue this while taking narcotics to help with bowel movements (Patient not taking: Reported on 06/12/2014) 30 capsule 1  . methocarbamol (ROBAXIN) 500 MG tablet Take 1 tablet (500 mg total) by mouth every 6 (six) hours as needed. (Patient not taking: Reported on 06/12/2014) 40 tablet 1  . nitroGLYCERIN (NITROSTAT) 0.4 MG SL tablet Place 1 tablet (0.4 mg total) under the tongue every 5 (five) minutes as needed. (Patient not taking: Reported on 06/12/2014) 25 tablet 6  . ondansetron  (ZOFRAN) 4 MG tablet Take 1 tablet (4 mg total) by mouth every 8 (eight) hours as needed for nausea. (Patient not taking: Reported on 06/12/2014) 60 tablet 0  . traMADol (ULTRAM) 50 MG tablet Take 1 tablet (50 mg total) by mouth every 6 (six) hours as needed. (Patient not taking: Reported on 06/12/2014) 30 tablet 0   No current facility-administered medications for this visit.    Patient confirms/reports the following allergies:  Allergies  Allergen Reactions  . Ivp Dye [Iodinated Diagnostic Agents] Swelling    No orders of the defined types were placed in this encounter.    AUTHORIZATION INFORMATION Primary Insurance: ID #: Group #:  Pre-Cert / Auth required: Pre-Cert / Auth #:   Secondary Insurance:   ID #:   Group #:  Pre-Cert / Auth required:  Pre-Cert / Auth #:   SCHEDULE INFORMATION: Procedure has been scheduled as follows:  Date: 07/05/2014                  Time:  9:30 AM Location: [facility] Stay  This Gastroenterology Pre-Precedure Review Form is being routed to the following provider(s): Jonette EvaSandi Fields, MD

## 2014-06-12 NOTE — Telephone Encounter (Signed)
[facility]. Please call him at [phone]

## 2014-06-13 MED ORDER — PEG-KCL-NACL-NASULF-NA ASC-C 100 G PO SOLR: 1.0000 | ORAL | Status: DC

## 2014-06-13 NOTE — Addendum Note (Signed)
Addended by: Lavena BullionSTEWART, DORIS H on: 06/13/2014 09:08 AM   Modules accepted: Orders

## 2014-06-13 NOTE — Telephone Encounter (Signed)
Rx sent to the pharmacy and instructions mailed to pt.  

## 2014-06-13 NOTE — Telephone Encounter (Signed)
[place]  Please notify us immediately if you are diabetic, take iron supplements, or if you are on coumadin or any blood thinners.  Patient Name:  Michael [name]  Date of procedure:  07/05/2014 Time to register at [place] [place] Stay:  8:30 AM Provider:  Dr. Oneida Alar  Please hold the following medications: N/A   07/04/2014  1 Day prior to procedure:     CLEAR LIQUIDS ALL DAY--NO SOLID FOODS!  Diabetic Medication Instructions:  N/A   At 5:00 PM Begin the prep as follows:     Empty 1 pouch A and 1 pouch B into disposable container.  Add lukewarm drinking water to the top line of the container.  Mix to dissolve.  You can mix solution ahead of time & refrigerate prior to drinking.  The solution should be used within 24 hours.  The container is divided by 4 marks.  Every 15 minutes, drink the solution down to the next mark (approx 8 oz) until the liter is complete.  Be sure to drink 16ounces of clear liquid of your choice   At 7:30 PM repeat:  Empty 1 pouch A and 1 pouch B into disposable container.  Add lukewarm drinking water to the top line of the container.  Mix to dissolve.  You can mix solution ahead of time & refrigerate prior to drinking.  The solution should be used within 24 hours.  The container is divided by 4 marks.  Every 15 minutes, drink the solution down to the next mark (approx 8 oz) until the liter is complete.  You must complete the entire prep to ensure the most effective cleaning.   CONTINUE CLEAR LIQUIDS UNTIL MIDNIGHT.     NOTHING TO EAT OR DRINK AFTER MIDNIGHT  07/05/2014  Day of Procedure  Give yourself one Fleet enema about 1 hour prior to leaving for the hospital.  You may take TYLENOL products.  Please continue your regular medications unless we have instructed you otherwise.   Please note, on the day of your procedure you MUST be accompanied by an adult who is willing to assume responsibility for you at time of discharge. If you  do not have such person with you, your procedure will have to be rescheduled.  *It is your responsibility to check with your insurance company for the benefits of coverage you have for this procedure. Unfortunately, not all insurance companies have benefits to cover all or part of these types of procedures. It is your responsibility to check your benefits, however we will be glad to assist you with any codes your insurance company may need.   Please note that most insurance companies will not cover a screening colonoscopy for people under the age of 30. For example, with some insurance companies you may have benefits for a screening colonoscopy, but if polyps are found the diagnosis will change and then you may have a deductible that will need to be met. Please make sure you check your benefits for screening colonoscopy as well as a diagnostic colonoscopy.   CLEAR LIQUIDS: (NO RED) Jello Apple Juice  White Grape Juice Water Banana popsicles  Kool-Aid  Coffee(No cream or milk) Tea (No cream or milk) Soft drinks Broth (fat free beef/chicken/vegetable)  Clear liquids allow you to see your fingers on the other side of the glass.  Be sure they are NOT RED in color, cloudy, but CLEAR.  Do Not Eat: Dairy products of any kind Cranberry juice Tomato or V8 Juice  Orange Juice Grapefruit Juice  Red Grape Juice Solid foods like cereal, oatmeal, yogurt, fruits, vegetables, creamed soups, eggs, bread, etc   HELPFUL HINTS TO MAKE DRINKING EASIER: -Make sure prep is extremely COLD.  Refrigerate the night before.  You may also put in freezer. -You may try mixing Crystal Light or Country Time Lemonade if you prefer.  MIx in small amounts.  Add more if necessary. -Trying drinking through a straw. -Rinse mouth with water or mouthwash between glasses to remove aftertaste. -Try sipping on a cold beverage/ice popsicles between glasses of prep. -Place a piece of sugar-free hard candy in mouth between  glasses. -If you become nauseated, try consuming smaller amounts or stretch out the time between glasses.  Stop for 30 minutes to an hour & slowly start back drinking.  Call our office with any questions or concerns at [phone].  Thank You,  @SIGNATURE @

## 2014-06-17 ENCOUNTER — Ambulatory Visit: Payer: No Typology Code available for payment source | Attending: Orthopedic Surgery

## 2014-06-17 ENCOUNTER — Telehealth: Payer: Self-pay

## 2014-06-17 DIAGNOSIS — R609 Edema, unspecified: Secondary | ICD-10-CM | POA: Insufficient documentation

## 2014-06-17 DIAGNOSIS — M25661 Stiffness of right knee, not elsewhere classified: Secondary | ICD-10-CM | POA: Insufficient documentation

## 2014-06-17 DIAGNOSIS — R262 Difficulty in walking, not elsewhere classified: Secondary | ICD-10-CM | POA: Insufficient documentation

## 2014-06-17 DIAGNOSIS — R29898 Other symptoms and signs involving the musculoskeletal system: Secondary | ICD-10-CM | POA: Insufficient documentation

## 2014-06-17 MED ORDER — PEG 3350-KCL-NA BICARB-NACL 420 G PO SOLR: 4000.0000 mL | ORAL | Status: DC

## 2014-06-17 NOTE — Telephone Encounter (Signed)
Pt called and said the Movie prep is too expensive and I told him I will send in the [place] and mail new instructions.

## 2014-06-17 NOTE — Therapy (Signed)
[facility] Outpatient Rehabilitation [facility] [address] [place], [place], [address] Phone: [phone]   Fax:  [phone]  Physical Therapy Evaluation  Patient Details  Name: Michael [name] MRN: [id number] Date of Birth: [birth year]  Encounter Date: 06/17/2014      PT End of Session - 06/17/14 1313    Visit Number 1   Number of Visits 24   Date for PT Re-Evaluation 08/15/13   PT Start Time 1230   PT Stop Time 1330   PT Time Calculation (min) 60 min   Activity Tolerance Patient tolerated treatment well   Behavior During Therapy [facility] for tasks assessed/performed      Past Medical History  Diagnosis Date  . Anterior myocardial infarction     ST-elevation; S/P emergent  drug-eluting stenting of proximal left anterior descending  . Renal insufficiency   . Hypertension   . Alcohol abuse   . Coronary artery disease     Cath October 2012-moderate disease in RCA, Circumflex    Past Surgical History  Procedure Laterality Date  . Doppler echocardiography      Preserved left venticular function  . Leg surgery      left leg - has rod and 4 pins  . Patellar tendon repair Right 04/26/2014    Procedure: RIGHT PATELLA TENDON REPAIR;  Surgeon: Sheral Apley, MD;  Location: [facility];  Service: Orthopedics;  Laterality: Right;    There were no vitals taken for this visit.  Visit Diagnosis:  Stiffness of knee joint, right - Plan: PT plan of care cert/re-cert  Edema - Plan: PT plan of care cert/re-cert  Weakness of left leg - Plan: PT plan of care cert/re-cert  Difficulty walking - Plan: PT plan of care cert/re-cert      Subjective Assessment - 06/17/14 1232    Symptoms Post op RT patella tendon repair. RT knee pain and weakness   Pertinent History Pt reports his knee gave and he fell off porch and torn tendon. He was in ED 04/13/14 and had surgical repair 04/26/14   Limitations Sitting;Lifting;Standing;Walking;House hold activities   How long can you sit comfortably? 20 minutes   How long can you stand comfortably? 30 minutes   How long can you walk comfortably? 5 minutes   Diagnostic tests NA   Patient Stated Goals To be able to bend leg and walk normally   Currently in Pain? Yes   Pain Score 5    Pain Location Neck  Anterior Rt knee   Pain Orientation Right   Pain Descriptors / Indicators Aching   Pain Type Surgical pain   Pain Onset More than a month ago   Pain Frequency Constant   Aggravating Factors  Sustained positions, bending.     Pain Relieving Factors Position changes, medication, elevation   Effect of Pain on Daily Activities limited activity on feet   Multiple Pain Sites No          [facility] PT Assessment - 06/17/14 1237    Assessment   Medical Diagnosis Lt patella tendon repair   Onset Date 04/26/14   Next MD Visit 07/07/14   Prior Therapy No    Precautions   Precautions --  Limit bending knee   Restrictions   Weight Bearing Restrictions No   Balance Screen   Has the patient fallen in the past 6 months Yes   How many times? 1   Has the patient had a decrease in activity level because of a fear  of falling?  Yes   Is the patient reluctant to leave their home because of a fear of falling?  No   Observation/Other Assessments   Observations --  Circuf of RT knee at patella 41cm. LT 39 cm   Focus on Therapeutic Outcomes (FOTO)  Limited 90% , predicted limit 52%   AROM   Right Knee Extension -43   Right Knee Flexion 56   Left Knee Extension 0   Left Knee Flexion 125   PROM   Left Knee Extension 180   Left Knee Flexion 128   Strength   Right Knee Flexion 3/5   Right Knee Extension 2-/5   Left Knee Flexion 5/5   Left Knee Extension --  4+/5   Ambulation/Gait   Ambulation/Gait Yes   Ambulation/Gait Assistance 6: Modified independent (Device/Increase time)   Ambulation Distance (Feet) --  100 feet   Assistive device Crutches   Gait Pattern Decreased step length - right;Decreased step  length - left;Decreased stance time - right;Decreased hip/knee flexion - right                          PT Education - 06/17/14 1312    Education provided Yes   Education Details ice and elevation due to warmth and edema   Person(s) Educated Patient   Methods Explanation;Tactile cues   Comprehension Verbalized understanding          PT Short Term Goals - 06/17/14 1316    PT SHORT TERM GOAL #1   Title Independent with inital HEP affter review   Time 1   Period Weeks   Status New   PT SHORT TERM GOAL #2   Title Improve active RT knee extension to -20 degrees   Time 4   Period Weeks   Status New   PT SHORT TERM GOAL #3   Title Improve active Rt knee flexion to 110 degrees   Time 4   Period Weeks   Status New   PT SHORT TERM GOAL #4   Title report pain decr 25% or more   Time 4   Period Weeks   Status New   PT SHORT TERM GOAL #5   Title walk without device in home safely and out of home with [facility] as able   Time 4   Period Weeks   Status New           PT Long Term Goals - 06/17/14 1318    PT LONG TERM GOAL #1   Title Independent with HEP as issued by last visit   Time 12   Period Weeks   Status New   PT LONG TERM GOAL #2   Title Report pain 1-2/10 max with walking community distances   Time 12   Period Weeks   Status New   PT LONG TERM GOAL #3   Title Walk with no device in home and community distances   Time 12   Period Weeks   Status New   PT LONG TERM GOAL #4   Title Walk stairs with one rail step over step   Time 12   Period Weeks   Status New   PT LONG TERM GOAL #5   Title Active RT knee range equal LT    Time 12   Period Weeks   Status New   Additional Long Term Goals   Additional Long Term Goals Yes   PT LONG TERM GOAL #6  Title return to normla home tasks without limits   Time 12   Period Weeks   Status New               Plan - 06/17/14 1313    Clinical Impression Statement Mr Michael [name] has significant  weakness and decrased range RT knee and difficulty walking.    Pt will benefit from skilled therapeutic intervention in order to improve on the following deficits Increased edema;Pain;Decreased mobility;Increased fascial restricitons;Decreased range of motion;Decreased strength;Decreased endurance;Decreased activity tolerance;Decreased balance;Difficulty walking   Rehab Potential Good   PT Frequency 2x / week   PT Duration 12 weeks   PT Treatment/Interventions ADLs/Self Care Home Management;Electrical Stimulation;Cryotherapy;Moist Heat;Gait training;Therapeutic exercise;Patient/family education;Balance training;Manual techniques;Passive range of motion;Therapeutic activities   PT Next Visit Plan Review HEP, modalities PRN, STW , mobs , manual tech, range    PT Home Exercise Plan progress HEP as tolerated   Recommended Other Services None   Consulted and Agree with Plan of Care Patient         Problem List Patient Active Problem List   Diagnosis Date Noted  . Cervical stenosis of spine 05/16/2014  . Essential hypertension 05/16/2014  . Numbness and tingling of right arm 11/28/2013  . Dental abscess 04/26/2013  . Dysuria 04/26/2013  . HTN (hypertension) 04/04/2013  . CAD (coronary artery disease) 05/25/2011    Caprice Red  PT  06/17/2014, 1:25 PM  [facility] [address] [place], [place], [address] Phone: [phone]   Fax:  [phone]

## 2014-06-17 NOTE — Patient Instructions (Signed)
Asked Michael [name] to continue to elevate and ice at home due to swelling an warmth at anterior RT knee. Asked him to bring HEP next visit.

## 2014-06-24 ENCOUNTER — Ambulatory Visit: Payer: No Typology Code available for payment source | Admitting: Physical Therapy

## 2014-06-24 DIAGNOSIS — R609 Edema, unspecified: Secondary | ICD-10-CM

## 2014-06-24 DIAGNOSIS — R262 Difficulty in walking, not elsewhere classified: Secondary | ICD-10-CM

## 2014-06-24 DIAGNOSIS — M25661 Stiffness of right knee, not elsewhere classified: Secondary | ICD-10-CM

## 2014-06-24 DIAGNOSIS — R29898 Other symptoms and signs involving the musculoskeletal system: Secondary | ICD-10-CM

## 2014-06-24 NOTE — Patient Instructions (Signed)
[employer]   Place a large rolled towel under right leg. Straighten leg. Hold _3-5___ seconds. Repeat __10__ times. Do __2-3__ sessions per day.  http://gt2.exer.us/298   Copyright  VHI. All rights reserved.  Clarita CraneStephanie F Omie Ferger, PT, DPT 06/24/2014 8:32 AM  [facility] [address] [place], [place] [address]  [phone] (office) [phone] (fax)

## 2014-06-24 NOTE — Therapy (Signed)
[facility] [address] [place], [place], [address] Phone: [phone]   Fax:  [phone]  Physical Therapy Treatment  Patient Details  Name: Michael [name] MRN: [id number] Date of Birth: [birth year] Referring Provider:  Quentin Angst, MD  Encounter Date: 06/24/2014      PT End of Session - 06/24/14 0832    Visit Number 2   Number of Visits 24   Date for PT Re-Evaluation 08/15/13   PT Start Time 0800   PT Stop Time 0846   PT Time Calculation (min) 46 min   Activity Tolerance Patient tolerated treatment well   Behavior During Therapy [facility] for tasks assessed/performed      Past Medical History  Diagnosis Date  . Anterior myocardial infarction     ST-elevation; S/P emergent  drug-eluting stenting of proximal left anterior descending  . Renal insufficiency   . Hypertension   . Alcohol abuse   . Coronary artery disease     Cath October 2012-moderate disease in RCA, Circumflex    Past Surgical History  Procedure Laterality Date  . Doppler echocardiography      Preserved left venticular function  . Leg surgery      left leg - has rod and 4 pins  . Patellar tendon repair Right 04/26/2014    Procedure: RIGHT PATELLA TENDON REPAIR;  Surgeon: Sheral Apley, MD;  Location: [facility];  Service: Orthopedics;  Laterality: Right;    There were no vitals taken for this visit.  Visit Diagnosis:  Stiffness of knee joint, right  Edema  Weakness of left leg  Difficulty walking      Subjective Assessment - 06/24/14 0803    Symptoms R knee feels ok, hurt the other day in the rain   Pertinent History Pt reports his knee gave and he fell off porch and torn tendon. He was in ED 04/13/14 and had surgical repair 04/26/14   Limitations Sitting;Lifting;Standing;Walking;House hold activities   How long can you sit comfortably? 20 minutes   How long can you stand comfortably? 30 minutes   How long can  you walk comfortably? 5 minutes   Diagnostic tests NA   Patient Stated Goals To be able to bend leg and walk normally   Currently in Pain? Yes   Pain Score 5    Pain Location Knee   Pain Orientation Right   Pain Descriptors / Indicators Aching   Pain Type Surgical pain   Pain Onset More than a month ago   Pain Frequency Constant   Aggravating Factors  static positioning, bending   Pain Relieving Factors positional changes, meds, elevation                    [facility] Adult PT Treatment/Exercise - 06/24/14 0808    Knee/Hip Exercises: Seated   Heel Slides AAROM;Right;10 reps   Knee/Hip Exercises: Supine   Quad Sets Strengthening;Right;10 reps   Short Arc Quad Sets Strengthening;Right;10 reps   Short Arc Quad Sets Limitations cues to decrease substitution with limited range   Heel Slides AAROM;Right;10 reps   Straight Leg Raises Limitations unable to perform   Knee/Hip Exercises: Sidelying   Hip ABduction Strengthening;Right;10 reps   Knee/Hip Exercises: Prone   Straight Leg Raises Strengthening;Right;10 reps   Modalities   Modalities Cryotherapy   Cryotherapy   Number Minutes Cryotherapy 15 Minutes   Cryotherapy Location Knee   Type of Cryotherapy Other (comment)  vasopneumatic mod pressure  PT Education - 06/24/14 [phone]    Education provided Yes   Education Details HEP   Person(s) Educated Patient   Methods Explanation;Demonstration;Handout   Comprehension Verbalized understanding;Returned demonstration;Verbal cues required;Tactile cues required;Need further instruction          PT Short Term Goals - 06/24/14 [id number]    PT SHORT TERM GOAL #1   Title Independent with inital HEP affter review   Time 1   Period Weeks   Status On-going   PT SHORT TERM GOAL #2   Title Improve active RT knee extension to -20 degrees   Time 4   Period Weeks   Status On-going   PT SHORT TERM GOAL #3   Title Improve active Rt knee flexion to 110 degrees    Time 4   Period Weeks   Status On-going   PT SHORT TERM GOAL #4   Title report pain decr 25% or more   Time 4   Period Weeks   Status On-going           PT Long Term Goals - 06/24/14 [id number]    PT LONG TERM GOAL #1   Title Independent with HEP as issued by last visit   Time 12   Period Weeks   Status On-going   PT LONG TERM GOAL #2   Title Report pain 1-2/10 max with walking community distances   Time 12   Period Weeks   Status On-going   PT LONG TERM GOAL #3   Title Walk with no device in home and community distances   Time 12   Period Weeks   Status On-going   PT LONG TERM GOAL #4   Title Walk stairs with one rail step over step   Time 12   Period Weeks   Status On-going   PT LONG TERM GOAL #5   Title Active RT knee range equal LT    Time 12   Period Weeks   Status On-going   PT LONG TERM GOAL #6   Title return to normla home tasks without limits   Time 12   Period Weeks   Status On-going               Plan - 06/24/14 [id number]    Clinical Impression Statement Pt demonstrates significant RLE weakness, especially quadricep weakness.  Pt needed mod cues for HEP and unable to perform SLR.   PT Next Visit Plan Review HEP, modalities PRN, STW , mobs , manual tech, range    PT Home Exercise Plan progress HEP as tolerated   Consulted and Agree with Plan of Care Patient        Problem List Patient Active Problem List   Diagnosis Date Noted  . Cervical stenosis of spine 05/16/2014  . Essential hypertension 05/16/2014  . Numbness and tingling of right arm 11/28/2013  . Dental abscess 04/26/2013  . Dysuria 04/26/2013  . HTN (hypertension) 04/04/2013  . CAD (coronary artery disease) 05/25/2011   Clarita CraneStephanie F Mushka Laconte, PT, DPT 06/24/2014 8:47 AM  [facility] [address] [place], [place], [address] Phone: [phone]   Fax:  [phone]

## 2014-06-28 ENCOUNTER — Telehealth: Payer: Self-pay | Admitting: Gastroenterology

## 2014-06-28 NOTE — Telephone Encounter (Signed)
I called pt and told him that [facility] usually just calls to confirm appt and meds. He can get the Ducolax and the fleet enema OTC. He received his instructions and his prep will be $19.00 and he is picking it up tomorrow.

## 2014-06-28 NOTE — Telephone Encounter (Signed)
Pt called to say that he has his prep but [facility] about medicines they have for him to pick up. He is trying to figure out what medicine they would have for him. He asked about the dulcolax and I told him that was OTC along with the Fleet enema he would need. He is going to call [facility] pharmacy back (he is having trouble getting through to them) and see what it is they have for him.  I told him to check and call us back and/or I would have DS call him to clarify everything he will need for his prep. [phone]

## 2014-07-04 ENCOUNTER — Ambulatory Visit: Payer: No Typology Code available for payment source | Admitting: Physical Therapy

## 2014-07-04 DIAGNOSIS — R609 Edema, unspecified: Secondary | ICD-10-CM

## 2014-07-04 DIAGNOSIS — M25661 Stiffness of right knee, not elsewhere classified: Secondary | ICD-10-CM

## 2014-07-04 DIAGNOSIS — R262 Difficulty in walking, not elsewhere classified: Secondary | ICD-10-CM

## 2014-07-04 DIAGNOSIS — R29898 Other symptoms and signs involving the musculoskeletal system: Secondary | ICD-10-CM

## 2014-07-04 NOTE — Therapy (Signed)
[facility] Outpatient Rehabilitation [facility] [address] [place], [place], [address] Phone: [phone]   Fax:  [phone]  Physical Therapy Treatment  Patient Details  Name: Michael [name] MRN: [id number] Date of Birth: 04/16/1961 Referring Provider:  Quentin Angst, MD  Encounter Date: 07/04/2014      PT End of Session - 07/04/14 0924    Visit Number 3   Number of Visits 24   Date for PT Re-Evaluation 08/15/13   PT Start Time 0845   PT Stop Time 0931   PT Time Calculation (min) 46 min   Activity Tolerance Patient tolerated treatment well   Behavior During Therapy [facility] for tasks assessed/performed      Past Medical History  Diagnosis Date  . Anterior myocardial infarction     ST-elevation; S/P emergent  drug-eluting stenting of proximal left anterior descending  . Renal insufficiency   . Hypertension   . Alcohol abuse   . Coronary artery disease     Cath October 2012-moderate disease in RCA, Circumflex    Past Surgical History  Procedure Laterality Date  . Doppler echocardiography      Preserved left venticular function  . Leg surgery      left leg - has rod and 4 pins  . Patellar tendon repair Right 04/26/2014    Procedure: RIGHT PATELLA TENDON REPAIR;  Surgeon: Sheral Apley, MD;  Location: [facility];  Service: Orthopedics;  Laterality: Right;    There were no vitals taken for this visit.  Visit Diagnosis:  Stiffness of knee joint, right  Edema  Weakness of left leg  Difficulty walking      Subjective Assessment - 07/04/14 0852    Symptoms R knee still hurts, went to MD yesterday.  MD stated knee "doing good."   Pertinent History Pt reports his knee gave and he fell off porch and torn tendon. He was in ED 04/13/14 and had surgical repair 04/26/14   Limitations Sitting;Lifting;Standing;Walking;House hold activities   How long can you sit comfortably? 20 minutes   How long can you stand comfortably? 30  minutes   How long can you walk comfortably? 5 minutes   Diagnostic tests NA   Patient Stated Goals To be able to bend leg and walk normally   Currently in Pain? Yes   Pain Score 5    Pain Location Knee   Pain Orientation Right   Pain Descriptors / Indicators Aching   Pain Type Surgical pain   Pain Onset More than a month ago   Pain Frequency Constant   Aggravating Factors  static positioning, bending   Pain Relieving Factors positional changes, meds, elevation                    [facility] Adult PT Treatment/Exercise - 07/04/14 0854    Knee/Hip Exercises: Aerobic   Stationary Bike NuStep level 2, 4 extremities x 8 min for ROM   Knee/Hip Exercises: Seated   Heel Slides --   Knee/Hip Exercises: Supine   Short Arc Quad Sets AAROM;Strengthening;Right;20 reps  x10 reps with eccentric control (decreased control)   Short Arc Quad Sets Limitations unable to actively lift fully, x 10 reps with eccentric control   Heel Slides AAROM;Right;20 reps  with strap   Straight Leg Raises Limitations able to lift actively with ~ 20 degree extensor lag   Modalities   Modalities Cryotherapy   Cryotherapy   Number Minutes Cryotherapy 10 Minutes   Cryotherapy Location Knee  Type of Cryotherapy Ice pack   Manual Therapy   Manual Therapy Joint mobilization   Joint Mobilization femur and tibia A/P grade 2-4 joint mobs for flexion/extension R knee; patella mobs laterally and up/down                  PT Short Term Goals - 07/04/14 0925    PT SHORT TERM GOAL #1   Title Independent with inital HEP affter review   Time 1   Period Weeks   Status On-going   PT SHORT TERM GOAL #2   Title Improve active RT knee extension to -20 degrees   Time 4   Period Weeks   Status On-going   PT SHORT TERM GOAL #3   Title Improve active Rt knee flexion to 110 degrees   Time 4   Period Weeks   Status On-going   PT SHORT TERM GOAL #4   Title report pain decr 25% or more   Time 4   Period  Weeks   Status On-going           PT Long Term Goals - 07/04/14 [id number]    PT LONG TERM GOAL #1   Title Independent with HEP as issued by last visit   Time 12   Period Weeks   Status On-going   PT LONG TERM GOAL #2   Title Report pain 1-2/10 max with walking community distances   Time 12   Period Weeks   Status On-going   PT LONG TERM GOAL #3   Title Walk with no device in home and community distances   Time 12   Period Weeks   Status On-going   PT LONG TERM GOAL #4   Title Walk stairs with one rail step over step   Time 12   Period Weeks   Status On-going   PT LONG TERM GOAL #5   Title Active RT knee range equal LT    Time 12   Period Weeks   Status On-going   PT LONG TERM GOAL #6   Title return to normla home tasks without limits   Time 12   Period Weeks   Status On-going               Plan - 07/04/14 [id number]    Clinical Impression Statement Pt now able to perform SLR however demonstrates approx 20 degress extensor lag.  Will continue to benefit from PT to maximize function.   PT Next Visit Plan Review HEP, modalities PRN, STW , mobs , manual tech, range    PT Home Exercise Plan progress HEP as tolerated   Consulted and Agree with Plan of Care Patient        Problem List Patient Active Problem List   Diagnosis Date Noted  . Cervical stenosis of spine 05/16/2014  . Essential hypertension 05/16/2014  . Numbness and tingling of right arm 11/28/2013  . Dental abscess 04/26/2013  . Dysuria 04/26/2013  . HTN (hypertension) 04/04/2013  . CAD (coronary artery disease) 05/25/2011   Clarita CraneStephanie F Brookelyn Gaynor, PT, DPT 07/04/2014 9:33 AM  [facility] [address] [place], [place], [address] Phone: [phone]   Fax:  [phone]

## 2014-07-05 ENCOUNTER — Ambulatory Visit (HOSPITAL_COMMUNITY)
Admission: RE | Admit: 2014-07-05 | Payer: No Typology Code available for payment source | Source: Ambulatory Visit | Admitting: Gastroenterology

## 2014-07-05 ENCOUNTER — Encounter (HOSPITAL_COMMUNITY): Admission: RE | Payer: Self-pay | Source: Ambulatory Visit

## 2014-07-05 SURGERY — COLONOSCOPY
Anesthesia: Moderate Sedation

## 2014-07-08 ENCOUNTER — Ambulatory Visit: Payer: No Typology Code available for payment source | Admitting: Rehabilitation

## 2014-07-10 ENCOUNTER — Ambulatory Visit: Payer: No Typology Code available for payment source | Admitting: Rehabilitation

## 2014-07-10 DIAGNOSIS — R262 Difficulty in walking, not elsewhere classified: Secondary | ICD-10-CM

## 2014-07-10 DIAGNOSIS — R29898 Other symptoms and signs involving the musculoskeletal system: Secondary | ICD-10-CM

## 2014-07-10 DIAGNOSIS — R609 Edema, unspecified: Secondary | ICD-10-CM

## 2014-07-10 DIAGNOSIS — M25661 Stiffness of right knee, not elsewhere classified: Secondary | ICD-10-CM

## 2014-07-10 NOTE — Therapy (Signed)
[facility] [address] [place], [place], [address] Phone: [phone]   Fax:  [phone]  Physical Therapy Treatment  Patient Details  Name: Michael EhlersGary S Zuniga MRN: [id number] Date of Birth: 06/14/1960 Referring Provider:  [name], Olugbemiga E, MD  Encounter Date: 07/10/2014      PT End of Session - 07/10/14 0926    Visit Number 4   Number of Visits 24   Date for PT Re-Evaluation 08/15/13   PT Start Time 0846   PT Stop Time 0936   PT Time Calculation (min) 50 min      Past Medical History  Diagnosis Date  . Anterior myocardial infarction     ST-elevation; S/P emergent  drug-eluting stenting of proximal left anterior descending  . Renal insufficiency   . Hypertension   . Alcohol abuse   . Coronary artery disease     Cath October 2012-moderate disease in RCA, Circumflex    Past Surgical History  Procedure Laterality Date  . Doppler echocardiography      Preserved left venticular function  . Leg surgery      left leg - has rod and 4 pins  . Patellar tendon repair Right 04/26/2014    Procedure: RIGHT PATELLA TENDON REPAIR;  Surgeon: Sheral Apleyimothy D Murphy, MD;  Location: [facility];  Service: Orthopedics;  Laterality: Right;    There were no vitals taken for this visit.  Visit Diagnosis:  Stiffness of knee joint, right  Edema  Weakness of left leg  Difficulty walking      Subjective Assessment - 07/10/14 0932    Currently in Pain? Yes   Pain Score 5    Pain Location Knee   Pain Orientation Right   Pain Descriptors / Indicators Aching   Aggravating Factors  3-4 hours of daily activity   Pain Relieving Factors elvation          [facility] PT Assessment - 07/10/14 0916    AROM   Right Knee Extension -30  quad lag   Right Knee Flexion 88  95 AAROM with strap supne                  [facility] Adult PT Treatment/Exercise - 07/10/14 0856    Knee/Hip Exercises: Aerobic   Stationary Bike  NuStep level 2, 4 extremities x 8 min for ROM   Knee/Hip Exercises: Supine   Quad Sets Right;3 sets;10 reps  good VMO contraction after verbal and tactile cues   Short Arc Quad Sets AAROM;Strengthening;Right;20 reps  x10 reps with eccentric control (decreased control)   Heel Slides AAROM;Right;20 reps;AROM  with / without strap   Knee/Hip Exercises: Prone   Straight Leg Raises 5 reps  30 degree quad lag   Modalities   Modalities Cryotherapy   Cryotherapy   Number Minutes Cryotherapy 10 Minutes   Cryotherapy Location Knee   Type of Cryotherapy Ice pack                  PT Short Term Goals - 07/04/14 0925    PT SHORT TERM GOAL #1   Title Independent with inital HEP affter review   Time 1   Period Weeks   Status On-going   PT SHORT TERM GOAL #2   Title Improve active RT knee extension to -20 degrees   Time 4   Period Weeks   Status On-going   PT SHORT TERM GOAL #3   Title Improve active Rt knee flexion to 110 degrees  Time 4   Period Weeks   Status On-going   PT SHORT TERM GOAL #4   Title report pain decr 25% or more   Time 4   Period Weeks   Status On-going           PT Long Term Goals - 07/04/14 1610    PT LONG TERM GOAL #1   Title Independent with HEP as issued by last visit   Time 12   Period Weeks   Status On-going   PT LONG TERM GOAL #2   Title Report pain 1-2/10 max with walking community distances   Time 12   Period Weeks   Status On-going   PT LONG TERM GOAL #3   Title Walk with no device in home and community distances   Time 12   Period Weeks   Status On-going   PT LONG TERM GOAL #4   Title Walk stairs with one rail step over step   Time 12   Period Weeks   Status On-going   PT LONG TERM GOAL #5   Title Active RT knee range equal LT    Time 12   Period Weeks   Status On-going   PT LONG TERM GOAL #6   Title return to normla home tasks without limits   Time 12   Period Weeks   Status On-going               Plan -  07/10/14 0929    Clinical Impression Statement Good VMO contract with tactile and verbal cues. Needs assist to reach full extension, unable to hold.    PT Next Visit Plan Review HEP, modalities PRN, STW , mobs , manual tech, range         Problem List Patient Active Problem List   Diagnosis Date Noted  . Cervical stenosis of spine 05/16/2014  . Essential hypertension 05/16/2014  . Numbness and tingling of right arm 11/28/2013  . Dental abscess 04/26/2013  . Dysuria 04/26/2013  . HTN (hypertension) 04/04/2013  . CAD (coronary artery disease) 05/25/2011    Sherrie Mustache, PTA 07/10/2014, 9:33 AM  [facility] [address] [place], [place], [address] Phone: [phone]   Fax:  [phone]

## 2014-07-16 ENCOUNTER — Ambulatory Visit: Payer: No Typology Code available for payment source | Attending: Internal Medicine | Admitting: Physical Therapy

## 2014-07-16 DIAGNOSIS — R29898 Other symptoms and signs involving the musculoskeletal system: Secondary | ICD-10-CM

## 2014-07-16 DIAGNOSIS — R609 Edema, unspecified: Secondary | ICD-10-CM | POA: Insufficient documentation

## 2014-07-16 DIAGNOSIS — R262 Difficulty in walking, not elsewhere classified: Secondary | ICD-10-CM | POA: Insufficient documentation

## 2014-07-16 DIAGNOSIS — M25661 Stiffness of right knee, not elsewhere classified: Secondary | ICD-10-CM | POA: Insufficient documentation

## 2014-07-16 NOTE — Therapy (Signed)
[facility] [address] [place], [place], [address] Phone: [phone]   Fax:  [phone]  Physical Therapy Treatment  Patient Details  Name: Michael [name] MRN: [id number] Date of Birth: 05/01/1961 Referring Provider:  [name], Olugbemiga E, MD  Encounter Date: 07/16/2014      PT End of Session - 07/16/14 1259    Visit Number 5   Number of Visits 24   Date for PT Re-Evaluation 08/15/13   PT Start Time 1100   PT Stop Time 1142   PT Time Calculation (min) 42 min   Activity Tolerance Patient tolerated treatment well   Behavior During Therapy [facility] for tasks assessed/performed      Past Medical History  Diagnosis Date  . Anterior myocardial infarction     ST-elevation; S/P emergent  drug-eluting stenting of proximal left anterior descending  . Renal insufficiency   . Hypertension   . Alcohol abuse   . Coronary artery disease     Cath October 2012-moderate disease in RCA, Circumflex    Past Surgical History  Procedure Laterality Date  . Doppler echocardiography      Preserved left venticular function  . Leg surgery      left leg - has rod and 4 pins  . Patellar tendon repair Right 04/26/2014    Procedure: RIGHT PATELLA TENDON REPAIR;  Surgeon: Sheral Apleyimothy D Murphy, MD;  Location: [facility];  Service: Orthopedics;  Laterality: Right;    There were no vitals taken for this visit.  Visit Diagnosis:  Stiffness of knee joint, right  Edema  Weakness of left leg  Difficulty walking      Subjective Assessment - 07/16/14 1104    Symptoms R knee is "not bad." Pain with prolonged standing   Pertinent History Pt reports his knee gave and he fell off porch and torn tendon. He was in ED 04/13/14 and had surgical repair 04/26/14   Limitations Sitting;Lifting;Standing;Walking;House hold activities   How long can you sit comfortably? 20 minutes   How long can you stand comfortably? 30 minutes   How long can  you walk comfortably? 5 minutes   Patient Stated Goals To be able to bend leg and walk normally   Currently in Pain? Yes   Pain Score 4    Pain Location Knee   Pain Orientation Right   Pain Descriptors / Indicators Aching   Pain Type Surgical pain   Pain Onset More than a month ago   Pain Frequency Constant   Aggravating Factors  3-4 hours of standing activities   Pain Relieving Factors elevation                    [facility] Adult PT Treatment/Exercise - 07/16/14 1105    Knee/Hip Exercises: Aerobic   Stationary Bike NuStep level 3, 4 extremities x 8 min for ROM   Knee/Hip Exercises: Supine   Quad Sets Right;3 sets;10 reps   Short Arc Quad Sets AAROM;Strengthening;Right;20 reps  x10 with eccentric control with improved hold   Straight Leg Raises Limitations able to lift actively with ~ 20 degree extensor lag   Modalities   Modalities Electrical Stimulation   Electrical Stimulation   Electrical Stimulation Location R quad/VMO   Electrical Stimulation Action VMS; trialed [place] unsuccessful   Electrical Stimulation Parameters 10 sec on/10 sec off; x 10 min for improved quad contraction neuromuscular re education   Statisticianlectrical Stimulation Goals Strength;Neuromuscular facilitation  PT Short Term Goals - 07/16/14 1300    PT SHORT TERM GOAL #1   Title Independent with inital HEP affter review   Status On-going   PT SHORT TERM GOAL #2   Title Improve active RT knee extension to -20 degrees   Status On-going   PT SHORT TERM GOAL #3   Title Improve active Rt knee flexion to 110 degrees   Status On-going   PT SHORT TERM GOAL #4   Title report pain decr 25% or more   Status On-going   PT SHORT TERM GOAL #5   Title walk without device in home safely and out of home with [facility] as able   Status On-going           PT Long Term Goals - 07/16/14 1301    PT LONG TERM GOAL #1   Title Independent with HEP as issued by last visit   Status On-going    PT LONG TERM GOAL #2   Title Report pain 1-2/10 max with walking community distances   Status On-going   PT LONG TERM GOAL #3   Title Walk with no device in home and community distances   Status On-going   PT LONG TERM GOAL #4   Title Walk stairs with one rail step over step   Status On-going   PT LONG TERM GOAL #5   Title Active RT knee range equal LT    Status On-going   PT LONG TERM GOAL #6   Title return to normla home tasks without limits   Status On-going               Plan - 07/16/14 1259    Clinical Impression Statement Improved quad contraction with VMS e stim.  Pt continues to demonstrate extensor lag with SLR however progressing well.     PT Next Visit Plan Review HEP, modalities PRN, STW , mobs , manual tech, range    PT Home Exercise Plan progress HEP as tolerated        Problem List Patient Active Problem List   Diagnosis Date Noted  . Cervical stenosis of spine 05/16/2014  . Essential hypertension 05/16/2014  . Numbness and tingling of right arm 11/28/2013  . Dental abscess 04/26/2013  . Dysuria 04/26/2013  . HTN (hypertension) 04/04/2013  . CAD (coronary artery disease) 05/25/2011   Clarita Crane, PT, DPT 07/16/2014 1:06 PM  [facility] [address] [place], [place], [address] Phone: [phone]   Fax:  [phone]

## 2014-07-18 ENCOUNTER — Ambulatory Visit: Payer: No Typology Code available for payment source

## 2014-07-18 VITALS — BP 132/78

## 2014-07-18 DIAGNOSIS — R609 Edema, unspecified: Secondary | ICD-10-CM

## 2014-07-18 DIAGNOSIS — R29898 Other symptoms and signs involving the musculoskeletal system: Secondary | ICD-10-CM

## 2014-07-18 DIAGNOSIS — R262 Difficulty in walking, not elsewhere classified: Secondary | ICD-10-CM

## 2014-07-18 DIAGNOSIS — M25661 Stiffness of right knee, not elsewhere classified: Secondary | ICD-10-CM

## 2014-07-18 NOTE — Therapy (Signed)
[facility] [address] [place], [place], [address] Phone: [phone]   Fax:  [phone]  Physical Therapy Treatment  Patient Details  Name: Michael EhlersGary S [name] MRN: [id number] Date of Birth: 02/25/1961 Referring Provider:  [name], Olugbemiga E, MD  Encounter Date: 07/18/2014      PT End of Session - 07/18/14 0931    Visit Number 6   Number of Visits 24   Date for PT Re-Evaluation 08/15/13   PT Start Time 0845   PT Stop Time 0945   PT Time Calculation (min) 60 min   Activity Tolerance Patient tolerated treatment well   Behavior During Therapy [facility] for tasks assessed/performed      Past Medical History  Diagnosis Date  . Anterior myocardial infarction     ST-elevation; S/P emergent  drug-eluting stenting of proximal left anterior descending  . Renal insufficiency   . Hypertension   . Alcohol abuse   . Coronary artery disease     Cath October 2012-moderate disease in RCA, Circumflex    Past Surgical History  Procedure Laterality Date  . Doppler echocardiography      Preserved left venticular function  . Leg surgery      left leg - has rod and 4 pins  . Patellar tendon repair Right 04/26/2014    Procedure: RIGHT PATELLA TENDON REPAIR;  Surgeon: Sheral Apleyimothy D Murphy, MD;  Location: [facility];  Service: Orthopedics;  Laterality: Right;    BP 132/78 mmHg  Visit Diagnosis:  Stiffness of knee joint, right  Edema  Weakness of left leg  Difficulty walking      Subjective Assessment - 07/18/14 0849    Symptoms Feell funny. Raineyweather . More areas in body ache today   Currently in Pain? Yes   Pain Score 4    Pain Location Knee   Pain Orientation Right   Pain Descriptors / Indicators Aching   Pain Type Surgical pain   Pain Onset More than a month ago   Pain Frequency Constant   Aggravating Factors  activity, weather   Pain Relieving Factors elevation   Multiple Pain Sites No          [facility]  PT Assessment - 07/18/14 0001    AROM   Right Knee Extension -26   Right Knee Flexion 105  post contract relax supine                  [facility] Adult PT Treatment/Exercise - 07/18/14 0851    Knee/Hip Exercises: Stretches   Quad Stretch 2 reps;60 seconds   Quad Stretch Limitations Contract relax with over pressure  and hip extended    Knee/Hip Exercises: Aerobic   Stationary Bike NuStep level 4, 2 extremities x 8 min for ROM  BP post Nustep  132/78   Knee/Hip Exercises: Supine   Quad Sets AROM;Right   Quad Sets Limitations 25 rpes 5 sec hold   Short Arc [employer]Quad Sets AAROM;AROM;Right  multiple reps  with cues and assist to  max quad contraction   Straight Leg Raises Right;15 reps  20-25 degree lag   Electrical Stimulation   Electrical Stimulation Location R quad/VMO   Electrical Stimulation Action V   Electrical Stimulation Parameters 10 sec   Electrical Stimulation Goals Strength                PT Education - 07/18/14 0913    Education provided Yes   Education Details REasons for doing HEP regularly  Person(s) Educated Patient   Methods Explanation   Comprehension Verbalized understanding          PT Short Term Goals - 07/16/14 1300    PT SHORT TERM GOAL #1   Title Independent with inital HEP affter review   Status On-going   PT SHORT TERM GOAL #2   Title Improve active RT knee extension to -20 degrees   Status On-going   PT SHORT TERM GOAL #3   Title Improve active Rt knee flexion to 110 degrees   Status On-going   PT SHORT TERM GOAL #4   Title report pain decr 25% or more   Status On-going   PT SHORT TERM GOAL #5   Title walk without device in home safely and out of home with [facility] as able   Status On-going           PT Long Term Goals - 07/16/14 1301    PT LONG TERM GOAL #1   Title Independent with HEP as issued by last visit   Status On-going   PT LONG TERM GOAL #2   Title Report pain 1-2/10 max with walking community distances    Status On-going   PT LONG TERM GOAL #3   Title Walk with no device in home and community distances   Status On-going   PT LONG TERM GOAL #4   Title Walk stairs with one rail step over step   Status On-going   PT LONG TERM GOAL #5   Title Active RT knee range equal LT    Status On-going   PT LONG TERM GOAL #6   Title return to normla home tasks without limits   Status On-going               Plan - 07/18/14 0932    Clinical Impression Statement Continue veery weak quads but range has improved act extension and flexion   Pt will benefit from skilled therapeutic intervention in order to improve on the following deficits Increased edema;Pain;Decreased mobility;Increased fascial restricitons;Decreased range of motion;Decreased strength;Decreased endurance;Decreased activity tolerance;Decreased balance;Difficulty walking   PT Frequency 2x / week   PT Duration --  10 weeks   PT Treatment/Interventions ADLs/Self Care Home Management;Electrical Stimulation;Cryotherapy;Moist Heat;Gait training;Therapeutic exercise;Patient/family education;Balance training;Manual techniques;Passive range of motion;Therapeutic activities   PT Next Visit Plan Review HEP, modalities PRN, STW , mobs , manual tech, range    PT Home Exercise Plan progress HEP as tolerated   Consulted and Agree with Plan of Care Patient        Problem List Patient Active Problem List   Diagnosis Date Noted  . Cervical stenosis of spine 05/16/2014  . Essential hypertension 05/16/2014  . Numbness and tingling of right arm 11/28/2013  . Dental abscess 04/26/2013  . Dysuria 04/26/2013  . HTN (hypertension) 04/04/2013  . CAD (coronary artery disease) 05/25/2011    Caprice Red PT 07/18/2014, 9:34 AM  [facility] [address] [place], [place], [address] Phone: [phone]   Fax:  [phone]

## 2014-07-18 NOTE — Patient Instructions (Signed)
On questioning he reports he only does exercise  At 3 AM but not during day.  Encouraged him to do SAQ and quad set 250 x per day total

## 2014-07-22 ENCOUNTER — Other Ambulatory Visit: Payer: Self-pay

## 2014-07-22 ENCOUNTER — Telehealth: Payer: Self-pay

## 2014-07-22 DIAGNOSIS — Z1211 Encounter for screening for malignant neoplasm of colon: Secondary | ICD-10-CM

## 2014-07-22 NOTE — Telephone Encounter (Signed)
Pt called to reschedule his colonoscopy that was cancelled for snow on 07/05/2014. He has been rescheduled to 08/16/2014 at 10:15 AM with Dr.Fields. I am mailing new instructions, he still has his prep.

## 2014-07-23 ENCOUNTER — Ambulatory Visit: Payer: No Typology Code available for payment source | Admitting: Physical Therapy

## 2014-07-23 DIAGNOSIS — R29898 Other symptoms and signs involving the musculoskeletal system: Secondary | ICD-10-CM

## 2014-07-23 DIAGNOSIS — R262 Difficulty in walking, not elsewhere classified: Secondary | ICD-10-CM

## 2014-07-23 DIAGNOSIS — R609 Edema, unspecified: Secondary | ICD-10-CM

## 2014-07-23 DIAGNOSIS — M25661 Stiffness of right knee, not elsewhere classified: Secondary | ICD-10-CM

## 2014-07-23 NOTE — Therapy (Signed)
[facility] [address] [place], [place], [address] Phone: [phone]   Fax:  [phone]  Physical Therapy Treatment  Patient Details  Name: Michael [name] MRN: [id number] Date of Birth: Jul 05, 1960 Referring Provider:  Quentin Angst, MD  Encounter Date: 07/23/2014      PT End of Session - 07/23/14 0921    Visit Number 7   Number of Visits 24   Date for PT Re-Evaluation 08/15/13   PT Start Time 0845   PT Stop Time 0926   PT Time Calculation (min) 41 min   Activity Tolerance Patient tolerated treatment well   Behavior During Therapy [facility] for tasks assessed/performed      Past Medical History  Diagnosis Date  . Anterior myocardial infarction     ST-elevation; S/P emergent  drug-eluting stenting of proximal left anterior descending  . Renal insufficiency   . Hypertension   . Alcohol abuse   . Coronary artery disease     Cath October 2012-moderate disease in RCA, Circumflex    Past Surgical History  Procedure Laterality Date  . Doppler echocardiography      Preserved left venticular function  . Leg surgery      left leg - has rod and 4 pins  . Patellar tendon repair Right 04/26/2014    Procedure: RIGHT PATELLA TENDON REPAIR;  Surgeon: Sheral Apley, MD;  Location: [facility];  Service: Orthopedics;  Laterality: Right;    There were no vitals taken for this visit.  Visit Diagnosis:  Stiffness of knee joint, right  Edema  Weakness of left leg  Difficulty walking      Subjective Assessment - 07/23/14 0850    Symptoms Knee feels good today   Pertinent History Pt reports his knee gave and he fell off porch and torn tendon. He was in ED 04/13/14 and had surgical repair 04/26/14   Limitations Sitting;Lifting;Standing;Walking;House hold activities   How long can you sit comfortably? 25 min   How long can you stand comfortably? 30 min   How long can you walk comfortably? 20-30 min   Patient Stated Goals To be able to bend leg and walk normally   Currently in Pain? Yes   Pain Score 4    Pain Location Knee   Pain Orientation Right   Pain Descriptors / Indicators Aching   Pain Type Surgical pain   Pain Onset More than a month ago   Pain Frequency Constant   Aggravating Factors  activity, weather   Pain Relieving Factors elevation                    [facility] Adult PT Treatment/Exercise - 07/23/14 0852    Knee/Hip Exercises: Aerobic   Stationary Bike Rec bike for ROM x 10 min   Knee/Hip Exercises: Supine   Quad Sets 5 sets;5 reps;AAROM;Strengthening;Right  with e stim   Straight Leg Raises AAROM;Strengthening;Right;5 sets;10 reps  with estim   Straight Leg Raises Limitations continues to demonstrate extensor lag; improved with repetition and estim   Modalities   Modalities Electrical Stimulation   Electrical Stimulation   Electrical Stimulation Location R quad/VMO   Electrical Stimulation Action VMS   Electrical Stimulation Parameters 10sec on/10 sec off x 15 min   Electrical Stimulation Goals Strength                  PT Short Term Goals - 07/23/14 0922    PT SHORT TERM GOAL #1  Title Independent with inital HEP affter review   Status On-going   PT SHORT TERM GOAL #2   Title Improve active RT knee extension to -20 degrees   Status On-going   PT SHORT TERM GOAL #3   Title Improve active Rt knee flexion to 110 degrees   Status On-going   PT SHORT TERM GOAL #4   Title report pain decr 25% or more   Status On-going   PT SHORT TERM GOAL #5   Title walk without device in home safely and [facility] of [facility] as able   Status On-going           PT Long Term Goals - 07/23/14 [id number]    PT LONG TERM GOAL #1   Title Independent with HEP as issued by last visit   Status On-going   PT LONG TERM GOAL #2   Title Report pain 1-2/10 max with walking community distances   Status On-going   PT LONG TERM GOAL #3   Title Walk with no device  in home and community distances   Status On-going   PT LONG TERM GOAL #4   Title Walk stairs with one rail step over step   Status On-going   PT LONG TERM GOAL #5   Title Active RT knee range equal LT    Status On-going   PT LONG TERM GOAL #6   Title return to normla home tasks without limits   Status On-going               Plan - 07/23/14 0921    Clinical Impression Statement Pt continues to demonstrate weak quads, improving with estim.  May benefit from standing exercises for strengthening.   PT Next Visit Plan Review HEP, modalities PRN, STW , mobs , manual tech, range    PT Home Exercise Plan progress HEP as tolerated   Consulted and Agree with Plan of Care Patient        Problem List Patient Active Problem List   Diagnosis Date Noted  . Cervical stenosis of spine 05/16/2014  . Essential hypertension 05/16/2014  . Numbness and tingling of right arm 11/28/2013  . Dental abscess 04/26/2013  . Dysuria 04/26/2013  . HTN (hypertension) 04/04/2013  . CAD (coronary artery disease) 05/25/2011   Clarita CraneStephanie F Chelbi Herber, PT, DPT 07/23/2014 9:27 AM  [facility] [address] [place], [place], [address] Phone: [phone]   Fax:  [phone]

## 2014-07-25 ENCOUNTER — Ambulatory Visit: Payer: No Typology Code available for payment source | Admitting: Rehabilitation

## 2014-07-25 DIAGNOSIS — R609 Edema, unspecified: Secondary | ICD-10-CM

## 2014-07-25 DIAGNOSIS — R262 Difficulty in walking, not elsewhere classified: Secondary | ICD-10-CM

## 2014-07-25 DIAGNOSIS — R29898 Other symptoms and signs involving the musculoskeletal system: Secondary | ICD-10-CM

## 2014-07-25 DIAGNOSIS — M25661 Stiffness of right knee, not elsewhere classified: Secondary | ICD-10-CM

## 2014-07-25 NOTE — Therapy (Signed)
[facility] [address] [place], [place], [address] Phone: [phone]   Fax:  [phone]  Physical Therapy Treatment  Patient Details  Name: Michael [name] MRN: [id number] Date of Birth: [birth year] Referring Provider:  Quentin Angst, MD  Encounter Date: 07/25/2014      PT End of Session - 07/25/14 0950    Visit Number 8   Number of Visits 24   Date for PT Re-Evaluation 08/15/13   PT Start Time 0845   PT Stop Time 0950   PT Time Calculation (min) 65 min      Past Medical History  Diagnosis Date  . Anterior myocardial infarction     ST-elevation; S/P emergent  drug-eluting stenting of proximal left anterior descending  . Renal insufficiency   . Hypertension   . Alcohol abuse   . Coronary artery disease     Cath October 2012-moderate disease in RCA, Circumflex    Past Surgical History  Procedure Laterality Date  . Doppler echocardiography      Preserved left venticular function  . Leg surgery      left leg - has rod and 4 pins  . Patellar tendon repair Right 04/26/2014    Procedure: RIGHT PATELLA TENDON REPAIR;  Surgeon: Sheral Apley, MD;  Location: [facility];  Service: Orthopedics;  Laterality: Right;    There were no vitals taken for this visit.  Visit Diagnosis:  Stiffness of knee joint, right  Edema  Weakness of left leg  Difficulty walking      Subjective Assessment - 07/25/14 0850    Symptoms 4/10 pain right knee. Not bad. I'm walking a little quicker.   Pertinent History Pt reports his knee gave and he fell off porch and torn tendon. He was in ED 04/13/14 and had surgical repair 04/26/14   Limitations Sitting;Lifting;Standing;Walking;House hold activities                    [facility] Adult PT Treatment/Exercise - 07/25/14 0937    Knee/Hip Exercises: Aerobic   Stationary Bike Nustep Level 6 LE x 10 min   Knee/Hip Exercises: Standing   Forward Step Up 1  set;10 reps;Hand Hold: 1   SLS 20  sec with UE support   Other Standing Knee Exercises sit-stand from eleveated seat x 10   Knee/Hip Exercises: Seated   Long Arc Quad Right;20 reps   Knee/Hip Exercises: Supine   [employer] Sets Strengthening;Right;15 reps   Short Arc [employer];Right  multiple reps  with cues and assist to  max quad contraction   Short Arc Quad Sets Limitations with VMS estim   Straight Leg Raises AAROM;Strengthening;Right;5 sets;10 reps  with estim   Straight Leg Raises Limitations continues to demonstrate extensor lag; improved with repetition and estim   Modalities   Modalities Electrical Stimulation   Electrical Stimulation   Electrical Stimulation Location R quad/VMO   Electrical Stimulation Action VMS   Electrical Stimulation Parameters 10 sec on/10 sec off   Electrical Stimulation Goals Strength                  PT Short Term Goals - 07/23/14 1027    PT SHORT TERM GOAL #1   Title Independent with inital HEP affter review   Status On-going   PT SHORT TERM GOAL #2   Title Improve active RT knee extension to -20 degrees   Status On-going   PT SHORT TERM GOAL #3   Title  Improve active Rt knee flexion to 110 degrees   Status On-going   PT SHORT TERM GOAL #4   Title report pain decr 25% or more   Status On-going   PT SHORT TERM GOAL #5   Title walk without device in home safely and [facility] of [facility] as able   Status On-going           PT Long Term Goals - 07/23/14 [id number]    PT LONG TERM GOAL #1   Title Independent with HEP as issued by last visit   Status On-going   PT LONG TERM GOAL #2   Title Report pain 1-2/10 max with walking community distances   Status On-going   PT LONG TERM GOAL #3   Title Walk with no device in home and community distances   Status On-going   PT LONG TERM GOAL #4   Title Walk stairs with one rail step over step   Status On-going   PT LONG TERM GOAL #5   Title Active RT knee range equal LT    Status  On-going   PT LONG TERM GOAL #6   Title return to normla home tasks without limits   Status On-going               Plan - 07/25/14 0951    Clinical Impression Statement Quad lag improved after VMS strengthening form -28 to -18. Pt tolerated standing strengthening exercises without increased pain.    PT Next Visit Plan quad strength, rom, check goals, continue VMS        Problem List Patient Active Problem List   Diagnosis Date Noted  . Cervical stenosis of spine 05/16/2014  . Essential hypertension 05/16/2014  . Numbness and tingling of right arm 11/28/2013  . Dental abscess 04/26/2013  . Dysuria 04/26/2013  . HTN (hypertension) 04/04/2013  . CAD (coronary artery disease) 05/25/2011    Sherrie Mustacheonoho, Pernell Lenoir McGee, PTA 07/25/2014, 9:56 AM  [facility] [address] [place], [place], [address] Phone: [phone]   Fax:  [phone]

## 2014-08-06 ENCOUNTER — Ambulatory Visit: Payer: No Typology Code available for payment source | Admitting: Physical Therapy

## 2014-08-06 ENCOUNTER — Telehealth: Payer: Self-pay

## 2014-08-06 DIAGNOSIS — M25661 Stiffness of right knee, not elsewhere classified: Secondary | ICD-10-CM

## 2014-08-06 DIAGNOSIS — R262 Difficulty in walking, not elsewhere classified: Secondary | ICD-10-CM

## 2014-08-06 DIAGNOSIS — R29898 Other symptoms and signs involving the musculoskeletal system: Secondary | ICD-10-CM

## 2014-08-06 DIAGNOSIS — R609 Edema, unspecified: Secondary | ICD-10-CM

## 2014-08-06 NOTE — Telephone Encounter (Signed)
REVIEWED-NO ADDITIONAL RECOMMENDATIONS. 

## 2014-08-06 NOTE — Telephone Encounter (Signed)
I called pt to update the triage and he said he has not had any change in his medications.

## 2014-08-06 NOTE — Therapy (Signed)
[facility] [address] [place], [place], [address] Phone: [phone]   Fax:  [phone]  Physical Therapy Treatment  Patient Details  Name: Michael [name] MRN: [id number] Date of Birth: [birth year] Referring Provider:  Quentin Angst, MD  Encounter Date: 08/06/2014      PT End of Session - 08/06/14 0949    Visit Number 9   Number of Visits 24   Date for PT Re-Evaluation 08/15/13   PT Start Time 0845   PT Stop Time 0927   PT Time Calculation (min) 42 min   Activity Tolerance Patient tolerated treatment well   Behavior During Therapy [facility] for tasks assessed/performed      Past Medical History  Diagnosis Date  . Anterior myocardial infarction     ST-elevation; S/P emergent  drug-eluting stenting of proximal left anterior descending  . Renal insufficiency   . Hypertension   . Alcohol abuse   . Coronary artery disease     Cath October 2012-moderate disease in RCA, Circumflex    Past Surgical History  Procedure Laterality Date  . Doppler echocardiography      Preserved left venticular function  . Leg surgery      left leg - has rod and 4 pins  . Patellar tendon repair Right 04/26/2014    Procedure: RIGHT PATELLA TENDON REPAIR;  Surgeon: Sheral Apley, MD;  Location: [facility];  Service: Orthopedics;  Laterality: Right;    There were no vitals taken for this visit.  Visit Diagnosis:  Stiffness of knee joint, right  Edema  Weakness of left leg  Difficulty walking      Subjective Assessment - 08/06/14 0852    Symptoms knee feels good; little pain with bending   Limitations Sitting;Lifting;Standing;Walking;House hold activities   How long can you sit comfortably? 30-40 min   How long can you stand comfortably? 30-40 min   How long can you walk comfortably? 40 min   Patient Stated Goals To be able to bend leg and walk normally   Currently in Pain? No/denies                     [facility] Adult PT Treatment/Exercise - 08/06/14 0852    Knee/Hip Exercises: Aerobic   Stationary Bike Nustep Level 6 LE x 10 min   Knee/Hip Exercises: Supine   Quad Sets Strengthening;15 reps   [employer] Limitations with [place] e stim   Short Arc [employer] AAROM;Strengthening;Right;15 reps   Short Arc [employer] Limitations with Russian e stim   Straight Leg Raises AAROM;Strengthening;Right;15 reps   Straight Leg Raises Limitations with [place]; cont to demonstrate quad lag with Environmental consultant R quad/VMO   Heritage manager Parameters 10 sec on/10 sec off   Psychologist, prison and probation services                  PT Short Term Goals - 08/06/14 0950    PT SHORT TERM GOAL #1   Title Independent with inital HEP affter review   Time 1   Period Weeks   Status On-going   PT SHORT TERM GOAL #2   Title Improve active RT knee extension to -20 degrees   Time 4   Period Weeks   Status On-going   PT SHORT TERM GOAL #3   Title Improve active Rt knee flexion to 110 degrees   Time  4   Period Weeks   Status On-going   PT SHORT TERM GOAL #4   Title report pain decr 25% or more   Status Achieved   PT SHORT TERM GOAL #5   Title walk without device in home safely and [facility] of [facility] as able   Time 4   Period Weeks   Status On-going           PT Long Term Goals - 08/06/14 0951    PT LONG TERM GOAL #1   Title Independent with HEP as issued by last visit   Time 12   Period Weeks   Status On-going   PT LONG TERM GOAL #2   Title Report pain 1-2/10 max with walking community distances   Time 12   Period Weeks   Status On-going   PT LONG TERM GOAL #3   Title Walk with no device in home and community distances   Time 12   Period Weeks   Status On-going   PT LONG TERM GOAL #4   Title Walk stairs with one rail step over step   Time 12   Period  Weeks   Status On-going   PT LONG TERM GOAL #5   Title Active RT knee range equal LT    Time 12   Period Weeks   Status On-going   PT LONG TERM GOAL #6   Title return to normla home tasks without limits   Time 12   Period Weeks   Status On-going               Plan - 08/06/14 0950    Clinical Impression Statement Improving quad lag after estim with overall continued improvement.  Will continue to benefit from PT to maximize function.   PT Next Visit Plan quad strength, rom, check goals, continue VMS   Consulted and Agree with Plan of Care Patient        Problem List Patient Active Problem List   Diagnosis Date Noted  . Cervical stenosis of spine 05/16/2014  . Essential hypertension 05/16/2014  . Numbness and tingling of right arm 11/28/2013  . Dental abscess 04/26/2013  . Dysuria 04/26/2013  . HTN (hypertension) 04/04/2013  . CAD (coronary artery disease) 05/25/2011   Clarita CraneStephanie F Cledis Sohn, PT, DPT 08/06/2014 9:52 AM  [facility] [address] [place], [place], [address] Phone: [phone]   Fax:  [phone]

## 2014-08-07 ENCOUNTER — Encounter: Payer: Self-pay | Admitting: Cardiovascular Disease

## 2014-08-07 ENCOUNTER — Ambulatory Visit: Payer: Self-pay | Admitting: Cardiovascular Disease

## 2014-08-07 ENCOUNTER — Ambulatory Visit (INDEPENDENT_AMBULATORY_CARE_PROVIDER_SITE_OTHER): Payer: No Typology Code available for payment source | Admitting: Cardiovascular Disease

## 2014-08-07 VITALS — BP 150/86 | HR 68 | Ht 68.0 in | Wt 230.0 lb

## 2014-08-07 DIAGNOSIS — I10 Essential (primary) hypertension: Secondary | ICD-10-CM

## 2014-08-07 DIAGNOSIS — E785 Hyperlipidemia, unspecified: Secondary | ICD-10-CM

## 2014-08-07 DIAGNOSIS — I2510 Atherosclerotic heart disease of native coronary artery without angina pectoris: Secondary | ICD-10-CM

## 2014-08-07 NOTE — Progress Notes (Signed)
History of Present Illness: 54 yo AAM with history of CAD, HTN here today for cardiac follow up. He was admitted to [facility] 03/31/11 with anterior STEMI with occluded LAD now s/p DES x 1 proximal LAD. His cath was performed by Dr. Fletcher Anon. Diffuse distal disease in the right posterolateral branch. The third OM was occluded and filled from left to left collaterals. Proximal LAD occlusion treated with 2.75 x 24 mm Promus Element DES x 1. He had a severe dye reaction. LV function was preserved by echo.    He is here today for follow up. He tells me that he has been doing well. No chest pain. No SOB, palpitations, near syncope or syncope. He recently had right knee surgery and is recovering well. His energy level is normal.   Primary Care Physician: Doreene Burke  Last Lipid Profile:   Past Medical History  Diagnosis Date  . Anterior myocardial infarction     ST-elevation; S/P emergent  drug-eluting stenting of proximal left anterior descending  . Renal insufficiency   . Hypertension   . Alcohol abuse   . Coronary artery disease     Cath October 2012-moderate disease in RCA, Circumflex    Past Surgical History  Procedure Laterality Date  . Doppler echocardiography      Preserved left venticular function  . Leg surgery      left leg - has rod and 4 pins  . Patellar tendon repair Right 04/26/2014    Procedure: RIGHT PATELLA TENDON REPAIR;  Surgeon: Renette Butters, MD;  Location: [place];  Service: Orthopedics;  Laterality: Right;    Allergies  Allergen Reactions  . Ivp Dye [Iodinated Diagnostic Agents] Swelling    History   Social History  . Marital Status: Significant Other    Spouse Name: N/A  . Number of Children: 0  . Years of Education: N/A   Occupational History  . Not on file.   Social History Main Topics  . Smoking status: Never Smoker   . Smokeless tobacco: Not on file  . Alcohol Use: Yes     Comment: Drinks occassionally  . Drug Use: No  . Sexual  Activity: Not on file   Other Topics Concern  . Not on file   Social History Narrative    Family History  Problem Relation Age of Onset  . Hyperlipidemia    . Diabetes    . Heart disease    . Cancer Maternal Aunt    Meds:  Current Outpatient Prescriptions on File Prior to Visit  Medication Sig Dispense Refill  . acetaminophen (TYLENOL) 500 MG chewable tablet Chew 500 mg by mouth every 6 (six) hours as needed for pain.    Marland Kitchen aspirin EC 81 MG tablet Take 81 mg by mouth daily.      Marland Kitchen lisinopril (PRINIVIL,ZESTRIL) 20 MG tablet TAKE ONE TABLET BY MOUTH ONCE DAILY 30 tablet 11  . lovastatin (MEVACOR) 20 MG tablet Take 1 tablet (20 mg total) by mouth at bedtime. 30 tablet 11  . methocarbamol (ROBAXIN) 500 MG tablet Take 1 tablet (500 mg total) by mouth every 6 (six) hours as needed. 40 tablet 1  . metoprolol (LOPRESSOR) 50 MG tablet TAKE ONE TABLET BY MOUTH TWICE DAILY 60 tablet 11  . peg 3350 powder (MOVIPREP) 100 G SOLR Take 1 kit (200 g total) by mouth as directed. 1 kit 0  . polyethylene glycol-electrolytes (TRILYTE) 420 G solution Take 4,000 mLs by mouth as directed. 4000 mL  0   No current facility-administered medications on file prior to visit.   Review of Systems:  As stated in the HPI and otherwise negative.   BP 150/86 mmHg  Pulse 68  Ht 5' 8"  (1.727 m)  Wt 230 lb (104.327 kg)  BMI 34.98 kg/m2  SpO2 96%  Physical Examination: General: Well developed, well nourished, NAD HEENT: OP clear, mucus membranes moist SKIN: warm, dry. No rashes. Neuro: No focal deficits Musculoskeletal: Muscle strength 5/5 all ext Psychiatric: Mood and affect normal Neck: No JVD, no carotid bruits, no thyromegaly, no lymphadenopathy. Lungs:Clear bilaterally, no wheezes, rhonci, crackles Cardiovascular: Regular rate and rhythm. No murmurs, gallops or rubs. Abdomen:Soft. Bowel sounds present. Non-tender.  Extremities: No lower extremity edema. Pulses are 2 + in the bilateral DP/PT.  Cardiac  cath 03/31/11: 1. Right coronary artery: The vessel is large in size and dominant.  There is a mild 20% disease in the mid segment with minor luminal  irregularities distally. The PDA is normal in size and free of  significant disease. First posterolateral branch is normal in size  with 40% proximal disease. The second posterolateral branch is  relatively large in size with 50% disease at the ostium followed by  a 70% lesion proximally and diffuse 80% disease distally. The RCA  gives collaterals to the left anterior descending artery as well as  OM-3.  2. Left main coronary artery: The vessel is normal in size without  significant disease.  3. Left anterior descending artery: The vessel is normal in size and  is occluded proximally a small septal branch. The distal LAD does  not have significant disease. The first diagonal branch is normal  to large in size and has 2 branches. The superior branch is  overall normal. The inferior branch has a 90% proximal disease.  4. Left circumflex artery: The vessel is normal in size and  nondominant. The first obtuse marginal is normal in size with a  mild proximal disease. The second OM is normal in size with no  significant disease. Third OM appears to be occluded with  collaterals supplied from the distal LAD and the right coronary  artery.  Assessment and Plan:   1. CAD: Stable. He initially presented with an anterior STEMI 03/31/11. A DES was placed in LAD. Moderate disease in other vessels. No recent chest pains. Continue ASA, beta blocker, statin and Ace-inh.   2. HTN: BP controlled at home. Continue metoprolol and Lisinopril.   3. HLD: I restarted Lovastatin 20 mg daily. I chose this because it is on the 4 Dollar List at [facility] and that is the pharmacy he uses.  He has no income and no insurance. Will fill out Crestor assistance program paperwork. Would like to start Crestor 20 mg daily and stop Mevacor.

## 2014-08-07 NOTE — Patient Instructions (Signed)
Your physician wants you to follow-up in:  6 months.  You will receive a reminder letter in the mail two months in advance. If you don't receive a letter, please call our office to schedule the follow-up appointment.  Fill out paperwork for Crestor assistance and return to our office with proof of income.  We will then send into company to see if you will qualify for assistance program.

## 2014-08-08 ENCOUNTER — Ambulatory Visit: Payer: No Typology Code available for payment source | Admitting: Rehabilitation

## 2014-08-08 DIAGNOSIS — M25661 Stiffness of right knee, not elsewhere classified: Secondary | ICD-10-CM

## 2014-08-08 DIAGNOSIS — R262 Difficulty in walking, not elsewhere classified: Secondary | ICD-10-CM

## 2014-08-08 DIAGNOSIS — R29898 Other symptoms and signs involving the musculoskeletal system: Secondary | ICD-10-CM

## 2014-08-08 DIAGNOSIS — R609 Edema, unspecified: Secondary | ICD-10-CM

## 2014-08-08 NOTE — Therapy (Signed)
[place], [place], [address] Phone: [phone]   Fax:  [phone]  Physical Therapy Treatment  Patient Details  Name: Michael [name] MRN: [id number] Date of Birth: 04/27/1961 Referring Provider:  Tresa Garter, MD  Encounter Date: 08/08/2014      PT End of Session - 08/08/14 1014    Visit Number 10   Number of Visits 24   Date for PT Re-Evaluation 08/15/13   PT Start Time 0930      Past Medical History  Diagnosis Date  . Anterior myocardial infarction     ST-elevation; S/P emergent  drug-eluting stenting of proximal left anterior descending  . Renal insufficiency   . Hypertension   . Alcohol abuse   . Coronary artery disease     Cath October 2012-moderate disease in RCA, Circumflex    Past Surgical History  Procedure Laterality Date  . Doppler echocardiography      Preserved left venticular function  . Leg surgery      left leg - has rod and 4 pins  . Patellar tendon repair Right 04/26/2014    Procedure: RIGHT PATELLA TENDON REPAIR;  Surgeon: Renette Butters, MD;  Location: [facility];  Service: Orthopedics;  Laterality: Right;    There were no vitals taken for this visit.  Visit Diagnosis:  Stiffness of knee joint, right  Edema  Weakness of left leg  Difficulty walking      Subjective Assessment - 08/08/14 0941    Symptoms not much pain. 3/10 intermittent right knee          [facility] PT Assessment - 08/08/14 0951    AROM   Right Knee Extension -25  -14 best quad Lag with SLR   Right Knee Flexion 110                  [facility] Adult PT Treatment/Exercise - 08/08/14 1000    Knee/Hip Exercises: Standing   Other Standing Knee Exercises sit-stand from eleveated seat x 10   Knee/Hip Exercises: Seated   Long Arc Quad Right;20 reps   Knee/Hip Exercises: Supine   Quad Sets Strengthening;Right;4 sets;20 reps   Quad Sets Limitations much improved  VMO after verbal and tactile cues today. Strong VMO contraction with quad lag improved to -14 with SLR   Short Arc [employer];Right;5 sets;10 reps   Short Arc [employer] Limitations with PTA assisting with Con[place], good recruitment of VMO today however still -25 knee extension in this position possibly due to fatigue   Straight Leg Raises AROM;Right;3 sets;10 reps   Straight Leg Raises Limitations with good VMO recruitment   Other Supine Knee Exercises Repeated SAQ and SLR with VMS cycle time 10/20 with PTA offering assist to reach full extension and asking pt to hold and lower slowly 7.5 minutes each exercise.                   PT Short Term Goals - 08/08/14 1015    PT SHORT TERM GOAL #1   Title Independent with inital HEP affter review   Time 1   Period Weeks   Status Achieved   PT SHORT TERM GOAL #2   Title Improve active RT knee extension to -20 degrees   Time 4   Period Weeks   Status On-going   PT SHORT TERM GOAL #3   Title Improve active Rt knee flexion to 110 degrees   Time 4  Period Weeks   Status Achieved   PT SHORT TERM GOAL #4   Title report pain decr 25% or more   Time 4   Period Weeks   Status Achieved   PT SHORT TERM GOAL #5   Title walk without device in home safely and out of home with [facility] as able   Time 4   Period Weeks   Status On-going           PT Long Term Goals - 08/08/14 1015    PT LONG TERM GOAL #1   Title Independent with HEP as issued by last visit   Time 12   Period Weeks   Status On-going   PT LONG TERM GOAL #2   Title Report pain 1-2/10 max with walking community distances   Time 12   Period Weeks   Status On-going   PT LONG TERM GOAL #3   Title Walk with no device in home and community distances   Time 12   Period Weeks   Status On-going   PT LONG TERM GOAL #4   Title Walk stairs with one rail step over step   Time 12   Period Weeks   Status On-going   PT LONG TERM GOAL #5   Title Active RT knee range  equal LT    Time 12   Period Weeks   Status On-going   PT LONG TERM GOAL #6   Title return to normla home tasks without limits   Time 12   Period Weeks   Status On-going               Plan - 08/08/14 1021    Clinical Impression Statement VMO stronger at beginning of session during Active therex.Best quad lag measure -14.  Fatigued toward end of treatment with quad lag progressively worsening despite VMS treatment.Right knee flexion improved STG$ 3 MET.     PT Next Visit Plan quad strength, rom, continue VMS        Problem List Patient Active Problem List   Diagnosis Date Noted  . Cervical stenosis of spine 05/16/2014  . Essential hypertension 05/16/2014  . Numbness and tingling of right arm 11/28/2013  . Dental abscess 04/26/2013  . Dysuria 04/26/2013  . HTN (hypertension) 04/04/2013  . CAD (coronary artery disease) 05/25/2011    Dorene Ar, PTA 08/08/2014, 10:35 AM  [facility] [address] [place], [place], [address] Phone: [phone]   Fax:  [phone]

## 2014-08-13 ENCOUNTER — Ambulatory Visit: Payer: No Typology Code available for payment source | Attending: Internal Medicine | Admitting: Rehabilitation

## 2014-08-13 DIAGNOSIS — R262 Difficulty in walking, not elsewhere classified: Secondary | ICD-10-CM | POA: Insufficient documentation

## 2014-08-13 DIAGNOSIS — R29898 Other symptoms and signs involving the musculoskeletal system: Secondary | ICD-10-CM | POA: Insufficient documentation

## 2014-08-13 DIAGNOSIS — R609 Edema, unspecified: Secondary | ICD-10-CM | POA: Insufficient documentation

## 2014-08-13 DIAGNOSIS — M25661 Stiffness of right knee, not elsewhere classified: Secondary | ICD-10-CM | POA: Insufficient documentation

## 2014-08-14 ENCOUNTER — Telehealth: Payer: Self-pay | Admitting: *Deleted

## 2014-08-14 NOTE — Telephone Encounter (Signed)
[facility] for Crestor assistance.

## 2014-08-14 NOTE — Therapy (Signed)
[facility] [address] [place], [place], [address] Phone: [phone]   Fax:  [phone]  Physical Therapy Treatment  Patient Details  Name: Michael EhlersGary S Mullinix MRN: [id number] Date of Birth: 12/30/1960 Referring Provider:  [name], Olugbemiga E, MD  Encounter Date: 08/13/2014      PT End of Session - 08/13/14 0906    Visit Number 11   Number of Visits 24   Date for PT Re-Evaluation 08/15/13   PT Start Time 0856   PT Stop Time 0945   PT Time Calculation (min) 49 min      Past Medical History  Diagnosis Date  . Anterior myocardial infarction     ST-elevation; S/P emergent  drug-eluting stenting of proximal left anterior descending  . Renal insufficiency   . Hypertension   . Alcohol abuse   . Coronary artery disease     Cath October 2012-moderate disease in RCA, Circumflex    Past Surgical History  Procedure Laterality Date  . Doppler echocardiography      Preserved left venticular function  . Leg surgery      left leg - has rod and 4 pins  . Patellar tendon repair Right 04/26/2014    Procedure: RIGHT PATELLA TENDON REPAIR;  Surgeon: Sheral Apleyimothy D Murphy, MD;  Location: [facility];  Service: Orthopedics;  Laterality: Right;    There were no vitals taken for this visit.  Visit Diagnosis:  Stiffness of knee joint, right  Edema  Weakness of left leg  Difficulty walking                  [facility] Adult PT Treatment/Exercise - 08/13/14 0909    Knee/Hip Exercises: Standing   Other Standing Knee Exercises sit-stand  x 10   Knee/Hip Exercises: Supine   Quad Sets Strengthening;Right;4 sets;20 reps   [employer] Sets Limitations with [place] Stim    Short Arc [employer]Quad Sets AROM;Right;2 sets;10 reps   Straight Leg Raises AROM;1 set;10 reps   Straight Leg Raises Limitations continued 27 degree quad lag   Knee/Hip Exercises: Prone   Hamstring Curl 2 sets;15 reps   Hip Extension Right;2 sets;10 reps   Other  Prone Exercises quad stretch with strap 3 x 30 sec   Modalities   Modalities Electrical Stimulation   Electrical Stimulation   Electrical Stimulation Location R quad/VMO   Electrical Stimulation Action Russian   Electrical Stimulation Parameters 20 sec on   to assist with sustained VMO contration.    Psychologist, prison and probation serviceslectrical Stimulation Goals Strength                  PT Short Term Goals - 08/08/14 1015    PT SHORT TERM GOAL #1   Title Independent with inital HEP affter review   Time 1   Period Weeks   Status Achieved   PT SHORT TERM GOAL #2   Title Improve active RT knee extension to -20 degrees   Time 4   Period Weeks   Status On-going   PT SHORT TERM GOAL #3   Title Improve active Rt knee flexion to 110 degrees   Time 4   Period Weeks   Status Achieved   PT SHORT TERM GOAL #4   Title report pain decr 25% or more   Time 4   Period Weeks   Status Achieved   PT SHORT TERM GOAL #5   Title walk without device in home safely and [facility] of [facility] as able  Time 4   Period Weeks   Status On-going           PT Long Term Goals - 08/08/14 1015    PT LONG TERM GOAL #1   Title Independent with HEP as issued by last visit   Time 12   Period Weeks   Status On-going   PT LONG TERM GOAL #2   Title Report pain 1-2/10 max with walking community distances   Time 12   Period Weeks   Status On-going   PT LONG TERM GOAL #3   Title Walk with no device in home and community distances   Time 12   Period Weeks   Status On-going   PT LONG TERM GOAL #4   Title Walk stairs with one rail step over step   Time 12   Period Weeks   Status On-going   PT LONG TERM GOAL #5   Title Active RT knee range equal LT    Time 12   Period Weeks   Status On-going   PT LONG TERM GOAL #6   Title return to normla home tasks without limits   Time 12   Period Weeks   Status On-going               Plan - 08/13/14 0906    Clinical Impression Statement Pt started 10 minutes late due  to error with check in. He continues to demonstrate -27 quad lag with SAQ and SLR therex. He demonstrates a good VMO contraction however fatigues quickly and is unable to sustain good VWO contrraction greater than 5-10 seconds.   PT Next Visit Plan quad strength, rom, continue VMS        Problem List Patient Active Problem List   Diagnosis Date Noted  . Cervical stenosis of spine 05/16/2014  . Essential hypertension 05/16/2014  . Numbness and tingling of right arm 11/28/2013  . Dental abscess 04/26/2013  . Dysuria 04/26/2013  . HTN (hypertension) 04/04/2013  . CAD (coronary artery disease) 05/25/2011    Sherrie Mustache, PTA 08/14/2014, 2:44 PM  [facility] [address] [place], [place], [address] Phone: [phone]   Fax:  [phone]

## 2014-08-15 ENCOUNTER — Ambulatory Visit: Payer: No Typology Code available for payment source | Admitting: Rehabilitation

## 2014-08-16 ENCOUNTER — Encounter (HOSPITAL_COMMUNITY)
Admission: RE | Disposition: A | Discharge status: Home / Self Care | Payer: Self-pay | Source: Ambulatory Visit | Attending: Gastroenterology

## 2014-08-16 ENCOUNTER — Ambulatory Visit (HOSPITAL_COMMUNITY)
Admission: RE | Admit: 2014-08-16 | Discharge: 2014-08-16 | Disposition: A | Discharge status: Home / Self Care | Payer: No Typology Code available for payment source | Source: Ambulatory Visit | Attending: Gastroenterology | Admitting: Gastroenterology

## 2014-08-16 ENCOUNTER — Encounter (HOSPITAL_COMMUNITY): Payer: Self-pay | Admitting: *Deleted

## 2014-08-16 DIAGNOSIS — K648 Other hemorrhoids: Secondary | ICD-10-CM | POA: Insufficient documentation

## 2014-08-16 DIAGNOSIS — K5730 Diverticulosis of large intestine without perforation or abscess without bleeding: Secondary | ICD-10-CM | POA: Insufficient documentation

## 2014-08-16 DIAGNOSIS — F1010 Alcohol abuse, uncomplicated: Secondary | ICD-10-CM | POA: Insufficient documentation

## 2014-08-16 DIAGNOSIS — Z7982 Long term (current) use of aspirin: Secondary | ICD-10-CM | POA: Insufficient documentation

## 2014-08-16 DIAGNOSIS — Z91041 Radiographic dye allergy status: Secondary | ICD-10-CM | POA: Insufficient documentation

## 2014-08-16 DIAGNOSIS — I10 Essential (primary) hypertension: Secondary | ICD-10-CM | POA: Insufficient documentation

## 2014-08-16 DIAGNOSIS — I2510 Atherosclerotic heart disease of native coronary artery without angina pectoris: Secondary | ICD-10-CM | POA: Insufficient documentation

## 2014-08-16 DIAGNOSIS — Z1211 Encounter for screening for malignant neoplasm of colon: Secondary | ICD-10-CM | POA: Insufficient documentation

## 2014-08-16 DIAGNOSIS — I252 Old myocardial infarction: Secondary | ICD-10-CM | POA: Insufficient documentation

## 2014-08-16 HISTORY — PX: COLONOSCOPY: SHX5424

## 2014-08-16 SURGERY — COLONOSCOPY
Anesthesia: Moderate Sedation

## 2014-08-16 MED ORDER — MEPERIDINE HCL 100 MG/ML IJ SOLN
INTRAMUSCULAR | Status: AC
  Filled 2014-08-16: qty 2

## 2014-08-16 MED ORDER — STERILE WATER FOR IRRIGATION IR SOLN
Status: DC | PRN
  Administered 2014-08-16: 10:00:00

## 2014-08-16 MED ORDER — MIDAZOLAM HCL 5 MG/5ML IJ SOLN
INTRAMUSCULAR | Status: DC | PRN
  Administered 2014-08-16 (×2): 2 mg via INTRAVENOUS

## 2014-08-16 MED ORDER — ATROPINE SULFATE 1 MG/ML IJ SOLN
INTRAMUSCULAR | Status: AC
  Filled 2014-08-16: qty 1

## 2014-08-16 MED ORDER — MEPERIDINE HCL 100 MG/ML IJ SOLN
INTRAMUSCULAR | Status: DC | PRN
  Administered 2014-08-16: 50 mg
  Administered 2014-08-16: 25 mg

## 2014-08-16 MED ORDER — MIDAZOLAM HCL 5 MG/5ML IJ SOLN
INTRAMUSCULAR | Status: AC
  Filled 2014-08-16: qty 10

## 2014-08-16 MED ORDER — ATROPINE SULFATE 1 MG/ML IJ SOLN
INTRAMUSCULAR | Status: DC | PRN
  Administered 2014-08-16: .5 mg via INTRAVENOUS

## 2014-08-16 MED ORDER — SODIUM CHLORIDE 0.9 % IV SOLN
INTRAVENOUS | Status: DC
  Administered 2014-08-16: 1000 mL via INTRAVENOUS

## 2014-08-16 NOTE — H&P (Signed)
Primary Care Physician:  Angelica Chessman, MD Primary Gastroenterologist:  Dr. Oneida Alar  Pre-Procedure History & Physical: HPI:  Michael [name] is a 54 y.o. male here for COLON CANCER SCREENING.  Past Medical History  Diagnosis Date  . Anterior myocardial infarction     ST-elevation; S/P emergent  drug-eluting stenting of proximal left anterior descending  . Renal insufficiency   . Hypertension   . Alcohol abuse   . Coronary artery disease     Cath October 2012-moderate disease in RCA, Circumflex    Past Surgical History  Procedure Laterality Date  . Doppler echocardiography      Preserved left venticular function  . Leg surgery      left leg - has rod and 4 pins  . Patellar tendon repair Right 04/26/2014    Procedure: RIGHT PATELLA TENDON REPAIR;  Surgeon: Renette Butters, MD;  Location: [facility];  Service: Orthopedics;  Laterality: Right;    Prior to Admission medications   Medication Sig Start Date End Date Taking? Authorizing Provider  aspirin EC 81 MG tablet Take 81 mg by mouth daily.     Yes Historical Provider, MD  lisinopril (PRINIVIL,ZESTRIL) 20 MG tablet TAKE ONE TABLET BY MOUTH ONCE DAILY 01/14/14  Yes Burnell Blanks, MD  lovastatin (MEVACOR) 20 MG tablet Take 1 tablet (20 mg total) by mouth at bedtime. 01/14/14  Yes Burnell Blanks, MD  metoprolol (LOPRESSOR) 50 MG tablet TAKE ONE TABLET BY MOUTH TWICE DAILY 01/14/14  Yes Burnell Blanks, MD  polyethylene glycol-electrolytes (TRILYTE) 420 G solution Take 4,000 mLs by mouth as directed. 06/17/14  Yes Danie Binder, MD  acetaminophen (TYLENOL) 500 MG chewable tablet Chew 500 mg by mouth every 6 (six) hours as needed for pain.    Historical Provider, MD  methocarbamol (ROBAXIN) 500 MG tablet Take 1 tablet (500 mg total) by mouth every 6 (six) hours as needed. 04/26/14   Renette Butters, MD  nitroGLYCERIN (NITROSTAT) 0.4 MG SL tablet Place 0.4 mg under the tongue every 5 (five)  minutes as needed for chest pain.    Historical Provider, MD  peg 3350 powder (MOVIPREP) 100 G SOLR Take 1 kit (200 g total) by mouth as directed. 06/13/14   Danie Binder, MD    Allergies as of 07/22/2014 - Review Complete 07/16/2014  Allergen Reaction Noted  . Ivp dye [iodinated diagnostic agents] Swelling 05/14/2011    Family History  Problem Relation Age of Onset  . Hyperlipidemia    . Diabetes    . Heart disease    . Cancer Maternal Aunt     History   Social History  . Marital Status: Significant Other    Spouse Name: N/A  . Number of Children: 0  . Years of Education: N/A   Occupational History  . Not on file.   Social History Main Topics  . Smoking status: Never Smoker   . Smokeless tobacco: Not on file  . Alcohol Use: Yes     Comment: Drinks occassionally  . Drug Use: No  . Sexual Activity: Not on file   Other Topics Concern  . Not on file   Social History Narrative    Review of Systems: See HPI, otherwise negative ROS   Physical Exam: BP 144/89 mmHg  Pulse 50  Temp(Src) 97.9 F (36.6 C) (Oral)  Resp 26  SpO2 100% General:   Alert,  pleasant and cooperative in NAD Head:  Normocephalic and atraumatic. Neck:  Supple;  Lungs:  Clear throughout to auscultation.    Heart:  Regular rate and rhythm. Abdomen:  Soft, nontender and nondistended. Normal bowel sounds, without guarding, and without rebound.   Neurologic:  Alert and  oriented x4;  grossly normal neurologically.  Impression/Plan:    SCREENING  Plan:  1. TCS TODAY

## 2014-08-16 NOTE — Discharge Instructions (Signed)
YOU DID NOT HAVE ANY POLYPS. You have internal hemorrhoids, which MAY CAUSE FOR YOUR RECTAL BLEEDING.  YOU HAVE diverticulosis IN YOUR RIGHT COLON.   CONTINUE YOUR WEIGHT LOSS EFFORTS. YOUR BODY MASS INDEX IS OVER 30 WHICH MEANS YOU ARE OBESE. OBESITY IS ASSOCIATED WITH AN INCREASE FOR ALL CANCERS, INCLUDING COLON CANCER.   Follow a HIGH FIBER/LOW FAT DIET. AVOID ITEMS THAT CAUSE BLOATING. See info below.  USE PREPARATION H as needed for RECTAL BLEEDING/pain.   Next colonoscopy in 10 years.  Colonoscopy Care After Read the instructions outlined below and refer to this sheet in the next week. These discharge instructions provide you with general information on caring for yourself after you leave the hospital. While your treatment has been planned according to the most current medical practices available, unavoidable complications occasionally occur. If you have any problems or questions after discharge, call DR. Suzan Manon, [phone].  ACTIVITY  You may resume your regular activity, but move at a slower pace for the next 24 hours.   Take frequent rest periods for the next 24 hours.   Walking will help get rid of the air and reduce the bloated feeling in your belly (abdomen).   No driving for 24 hours (because of the medicine (anesthesia) used during the test).   You may shower.   Do not sign any important legal documents or operate any machinery for 24 hours (because of the anesthesia used during the test).    NUTRITION  Drink plenty of fluids.   You may resume your normal diet as instructed by your doctor.   Begin with a light meal and progress to your normal diet. Heavy or fried foods are harder to digest and may make you feel sick to your stomach (nauseated).   Avoid alcoholic beverages for 24 hours or as instructed.    MEDICATIONS  You may resume your normal medications.   WHAT YOU CAN EXPECT TODAY  Some feelings of bloating in the abdomen.   Passage of more gas  than usual.   Spotting of blood in your stool or on the toilet paper  .  IF YOU HAD POLYPS REMOVED DURING THE COLONOSCOPY:  Eat a soft diet IF YOU HAVE NAUSEA, BLOATING, ABDOMINAL PAIN, OR VOMITING.    FINDING OUT THE RESULTS OF YOUR TEST Not all test results are available during your visit. DR. Darrick PennaFIELDS WILL CALL YOU WITHIN 7 DAYS OF YOUR PROCEDUE WITH YOUR RESULTS. Do not assume everything is normal if you have not heard from DR. Sherwood Castilla IN ONE WEEK, CALL HER OFFICE AT [phone].  SEEK IMMEDIATE MEDICAL ATTENTION AND CALL THE OFFICE: [phone] IF:  You have more than a spotting of blood in your stool.   Your belly is swollen (abdominal distention).   You are nauseated or vomiting.   You have a temperature over 101F.   You have abdominal pain or discomfort that is severe or gets worse throughout the day.  High-Fiber Diet A high-fiber diet changes your normal diet to include more whole grains, legumes, fruits, and vegetables. Changes in the diet involve replacing refined carbohydrates with unrefined foods. The calorie level of the diet is essentially unchanged. The Dietary Reference Intake (recommended amount) for adult males is 38 grams per day. For adult females, it is 25 grams per day. Pregnant and lactating women should consume 28 grams of fiber per day. Fiber is the intact part of a plant that is not broken down during digestion. Functional fiber is fiber that has been  isolated from the plant to provide a beneficial effect in the body. PURPOSE  Increase stool bulk.   Ease and regulate bowel movements.   Lower cholesterol.  INDICATIONS THAT YOU NEED MORE FIBER  Constipation and hemorrhoids.   Uncomplicated diverticulosis (intestine condition) and irritable bowel syndrome.   Weight management.   As a protective measure against hardening of the arteries (atherosclerosis), diabetes, and cancer.   GUIDELINES FOR INCREASING FIBER IN THE DIET  Start adding fiber to the  diet slowly. A gradual increase of about 5 more grams (2 slices of whole-wheat bread, 2 servings of most fruits or vegetables, or 1 bowl of high-fiber cereal) per day is best. Too rapid an increase in fiber may result in constipation, flatulence, and bloating.   Drink enough water and fluids to keep your urine clear or pale yellow. Water, juice, or caffeine-free drinks are recommended. Not drinking enough fluid may cause constipation.   Eat a variety of high-fiber foods rather than one type of fiber.   Try to increase your intake of fiber through using high-fiber foods rather than fiber pills or supplements that contain small amounts of fiber.   The goal is to change the types of food eaten. Do not supplement your present diet with high-fiber foods, but replace foods in your present diet.  INCLUDE A VARIETY OF FIBER SOURCES  Replace refined and processed grains with whole grains, canned fruits with fresh fruits, and incorporate other fiber sources. White rice, white breads, and most bakery goods contain little or no fiber.   Brown whole-grain rice, buckwheat oats, and many fruits and vegetables are all good sources of fiber. These include: broccoli, Brussels sprouts, cabbage, cauliflower, beets, sweet potatoes, white potatoes (skin on), carrots, tomatoes, eggplant, squash, berries, fresh fruits, and dried fruits.   Cereals appear to be the richest source of fiber. Cereal fiber is found in whole grains and bran. Bran is the fiber-rich outer coat of cereal grain, which is largely removed in refining. In whole-grain cereals, the bran remains. In breakfast cereals, the largest amount of fiber is found in those with "bran" in their names. The fiber content is sometimes indicated on the label.   You may need to include additional fruits and vegetables each day.   In baking, for 1 cup white flour, you may use the following substitutions:   1 cup whole-wheat flour minus 2 tablespoons.   1/2 cup white  flour plus 1/2 cup whole-wheat flour.   Diverticulosis Diverticulosis is a common condition that develops when small pouches (diverticula) form in the wall of the colon. The risk of diverticulosis increases with age. It happens more often in people who eat a low-fiber diet. Most individuals with diverticulosis have no symptoms. Those individuals with symptoms usually experience belly (abdominal) pain, constipation, or loose stools (diarrhea).  HOME CARE INSTRUCTIONS  Increase the amount of fiber in your diet as directed by your caregiver or dietician. This may reduce symptoms of diverticulosis.   Drink at least 6 to 8 glasses of water each day to prevent constipation.   Try not to strain when you have a bowel movement.   Avoiding nuts and seeds to prevent complications is still an uncertain benefit.       FOODS HAVING HIGH FIBER CONTENT INCLUDE:  Fruits. Apple, peach, pear, tangerine, raisins, prunes.   Vegetables. Brussels sprouts, asparagus, broccoli, cabbage, carrot, cauliflower, romaine lettuce, spinach, summer squash, tomato, winter squash, zucchini.   Starchy Vegetables. Baked beans, kidney beans, lima beans, split  peas, lentils, potatoes (with skin).   Grains. Whole wheat bread, brown rice, bran flake cereal, plain oatmeal, white rice, shredded wheat, bran muffins.    SEEK IMMEDIATE MEDICAL CARE IF:  You develop increasing pain or severe bloating.   You have an oral temperature above 101F.   You develop vomiting or bowel movements that are bloody or black.   Hemorrhoids Hemorrhoids are dilated (enlarged) veins around the rectum. Sometimes clots will form in the veins. This makes them swollen and painful. These are called thrombosed hemorrhoids. Causes of hemorrhoids include:  Constipation.   Straining to have a bowel movement.   HEAVY LIFTING HOME CARE INSTRUCTIONS  Eat a well balanced diet and drink 6 to 8 glasses of water every day to avoid constipation. You  may also use a bulk laxative.   Avoid straining to have bowel movements.   Keep anal area dry and clean.   Do not use a donut shaped pillow or sit on the toilet for long periods. This increases blood pooling and pain.   Move your bowels when your body has the urge; this will require less straining and will decrease pain and pressure.

## 2014-08-16 NOTE — Op Note (Signed)
NAME:  Michael [name], GARY            ACCOUNT NO.:  [id number]  MEDICAL RECORD NO.:  [id number]  LOCATION:  [facility]                          FACILITY:  [facility]  PHYSICIAN:  Jonette EvaSandi Marsa Matteo, M.D.     DATE OF BIRTH:  July 28, 1960  DATE OF PROCEDURE:  08/16/2014 DATE OF DISCHARGE:  08/16/2014                              OPERATIVE REPORT   PROCEDURE:  Screening colonoscopy.  INDICATION FOR EXAM:  Mr. Michael [name] is a 54 year old male who presents for average risk colon cancer screening.  FINDINGS: 1. Normal ileum approximately 2-3 cm visualized. 2. Pancolonic diverticula seen.  Otherwise, no polyps. 3. Small internal hemorrhoids.  IMPRESSION: 1. Moderate pancolonic diverticulosis. 2. Small internal hemorrhoids.  RECOMMENDATIONS: 1. Continue weight loss effort. 2. Follow a high-fiber, low-fat diet.  Avoid items that cause     bloating. 3. Use preparation as needed for rectal bleeding or pain. 4. Screening colonoscopy in 10 years.  CECAL WITHDRAWAL TIME:  10 minutes.  MEDICATIONS: 1. Demerol 75 mg IV. 2. Versed 4 mg IV. 3. Atropine 0.5 mg IV.  PROCEDURE TECHNIQUE:  Physical exam was performed.  Informed consent was obtained from the patient after explaining the benefits, risks, and alternatives to the procedure.  The patient was connected to the monitor and placed in the left lateral position.  Continuous oxygen was provided by nasal cannula and IV medicine administered through an indwelling cannula.  After administration of sedation and rectal exam, the patient's rectum was intubated and scope advanced under direct visualization to the distal terminal ileum.  The scope was removed slowly by careful exam of color, texture, anatomy, and integrity of the mucosal way out.  The patient was recovered in endoscopy and discharged home in satisfactory condition.     Jonette EvaSandi Mccayla Shimada, M.D.     SF/MEDQ  D:  08/16/2014  T:  08/16/2014  Job:  [id number]  cc:   Jeanann Lewandowskylugbemiga Jegede, MD

## 2014-08-16 NOTE — Op Note (Signed)
Prior to procedure beginning patient heart rate 43, Dr. Darrick PennaFields ordered 0.5 atropine , heart rate increased to 68

## 2014-08-19 ENCOUNTER — Encounter (HOSPITAL_COMMUNITY): Payer: Self-pay | Admitting: Gastroenterology

## 2014-08-20 ENCOUNTER — Ambulatory Visit: Payer: No Typology Code available for payment source | Admitting: Physical Therapy

## 2014-08-20 DIAGNOSIS — R29898 Other symptoms and signs involving the musculoskeletal system: Secondary | ICD-10-CM

## 2014-08-20 DIAGNOSIS — R262 Difficulty in walking, not elsewhere classified: Secondary | ICD-10-CM

## 2014-08-20 DIAGNOSIS — R609 Edema, unspecified: Secondary | ICD-10-CM

## 2014-08-20 DIAGNOSIS — M25661 Stiffness of right knee, not elsewhere classified: Secondary | ICD-10-CM

## 2014-08-20 NOTE — Therapy (Signed)
[facility] Outpatient Rehabilitation [facility] [address] [place], [place], [address] Phone: [phone]   Fax:  [phone]  Physical Therapy Treatment  Patient Details  Name: Michael [name] MRN: [id number] Date of Birth: [birth year] Referring Provider:  Quentin Angst, MD  Encounter Date: 08/20/2014      PT End of Session - 08/20/14 0850    Visit Number 12   Number of Visits 24   Date for PT Re-Evaluation 08/15/13   PT Start Time 0846   PT Stop Time 0935   PT Time Calculation (min) 49 min   Activity Tolerance Patient tolerated treatment well   Behavior During Therapy [facility] for tasks assessed/performed      Past Medical History  Diagnosis Date  . Anterior myocardial infarction     ST-elevation; S/P emergent  drug-eluting stenting of proximal left anterior descending  . Renal insufficiency   . Hypertension   . Alcohol abuse   . Coronary artery disease     Cath October 2012-moderate disease in RCA, Circumflex    Past Surgical History  Procedure Laterality Date  . Doppler echocardiography      Preserved left venticular function  . Leg surgery      left leg - has rod and 4 pins  . Patellar tendon repair Right 04/26/2014    Procedure: RIGHT PATELLA TENDON REPAIR;  Surgeon: Sheral Apley, MD;  [facility]:  [facility];  Service: Orthopedics;  Laterality: Right;  . Colonoscopy N/A 08/16/2014    Procedure: COLONOSCOPY;  Surgeon: West Bali, MD;  [facility];  Service: Endoscopy;  Laterality: N/A;  10:15 AM    There were no vitals taken for this visit.  Visit Diagnosis:  Stiffness of knee joint, right  Edema  Weakness of left leg  Difficulty walking      Subjective Assessment - 08/20/14 0851    Symptoms pt reports that he has been doing well and has been doing his exercises and has been trying to walk around the house more without his cane.   Currently in Pain? Yes   Pain Score 3    Pain Location Knee   Pain  Orientation Right   Pain Descriptors / Indicators Aching          [facility] PT Assessment - 08/20/14 0001    AROM   Right Knee Extension -20  SLR extensor lag   Right Knee Flexion 116                  [facility] Adult PT Treatment/Exercise - 08/20/14 0856    Knee/Hip Exercises: Stretches   Hip Flexor Stretch 4 reps;30 seconds  10 sec contract-relax in supine   Knee/Hip Exercises: Aerobic   Stationary Bike Bike 10 minutes level 1   Knee/Hip Exercises: Standing   Other Standing Knee Exercises sit-stand  x 10   Knee/Hip Exercises: Supine   Quad Sets Strengthening;Right;4 sets;20 reps   Quad Sets Limitations with VMS for quad activation   Short Arc Quad Sets AROM;Right;2 sets;10 reps   Short Arc Quad Sets Limitations --  3 second hold    Straight Leg Raises AROM;1 set;10 reps   Straight Leg Raises Limitations 20 degree extensor lag   Knee/Hip Exercises: Prone   Hip Extension Right;2 sets;10 reps  with hamstring curl    Modalities   Modalities Electrical Stimulation   Electrical Stimulation   Electrical Stimulation Location R quad/VMO   Electrical Stimulation Action Russin/VMS   Electrical Stimulation Parameters 20  sec on with Public librarianQuad contraction   Electrical Stimulation Goals Strength                PT Education - 08/20/14 [phone]    Education provided Yes   Education Details Stretches and continuing with HEP   Person(s) Educated Patient   Methods Explanation   Comprehension Verbalized understanding          PT Short Term Goals - 08/08/14 1015    PT SHORT TERM GOAL #1   Title Independent with inital HEP affter review   Time 1   Period Weeks   Status Achieved   PT SHORT TERM GOAL #2   Title Improve active RT knee extension to -20 degrees   Time 4   Period Weeks   Status On-going   PT SHORT TERM GOAL #3   Title Improve active Rt knee flexion to 110 degrees   Time 4   Period Weeks   Status Achieved   PT SHORT TERM GOAL #4   Title report pain decr 25%  or more   Time 4   Period Weeks   Status Achieved   PT SHORT TERM GOAL #5   Title walk without device in home safely and [facility]   Time 4   Period Weeks   Status On-going           PT Long Term Goals - 08/08/14 1015    PT LONG TERM GOAL #1   Title Independent with HEP as issued by last visit   Time 12   Period Weeks   Status On-going   PT LONG TERM GOAL #2   Title Report pain 1-2/10 max with walking community distances   Time 12   Period Weeks   Status On-going   PT LONG TERM GOAL #3   Title Walk with no device in home and community distances   Time 12   Period Weeks   Status On-going   PT LONG TERM GOAL #4   Title Walk stairs with one rail step over step   Time 12   Period Weeks   Status On-going   PT LONG TERM GOAL #5   Title Active RT knee range equal LT    Time 12   Period Weeks   Status On-going   PT LONG TERM GOAL #6   Title return to normla home tasks without limits   Time 12   Period Weeks   Status On-going               Plan - 08/20/14 0926    Clinical Impression Statement Jillyn HiddenGary has made progress with quad activation and is able to perform SLR without assistance but continues to exhibit an extensor lag of 20 degrees during a SLR.  He is able to get a contraciton of his VMO but fatigues quickly during exercise.    PT Next Visit Plan quad strength, rom, continue VMS   Consulted and Agree with Plan of Care Patient        Problem List Patient Active Problem List   Diagnosis Date Noted  . Special screening for malignant neoplasms, colon   . Cervical stenosis of spine 05/16/2014  . Essential hypertension 05/16/2014  . Numbness and tingling of right arm 11/28/2013  . Dental abscess 04/26/2013  . Dysuria 04/26/2013  . HTN (hypertension) 04/04/2013  . CAD (coronary artery disease) 05/25/2011   Lulu RidingKristoffer Leamon PT, DPT, LAT, ATC  08/20/2014  1:05 PM  [facility] Outpatient Rehabilitation [facility] [address] [place], [place], [address] Phone: [phone]   Fax:  [phone]

## 2014-08-22 ENCOUNTER — Ambulatory Visit: Payer: No Typology Code available for payment source | Admitting: Rehabilitation

## 2014-08-22 DIAGNOSIS — R29898 Other symptoms and signs involving the musculoskeletal system: Secondary | ICD-10-CM

## 2014-08-22 DIAGNOSIS — R262 Difficulty in walking, not elsewhere classified: Secondary | ICD-10-CM

## 2014-08-22 DIAGNOSIS — R609 Edema, unspecified: Secondary | ICD-10-CM

## 2014-08-22 DIAGNOSIS — M25661 Stiffness of right knee, not elsewhere classified: Secondary | ICD-10-CM

## 2014-08-22 NOTE — Therapy (Signed)
[facility] Outpatient Rehabilitation [facility] [address] [place], [place], [address] Phone: [phone]   Fax:  [phone]  Physical Therapy Treatment  Patient Details  Name: Michael [name] MRN: [id number] Date of Birth: May 18, 1961 Referring Provider:  Quentin Angst, MD  Encounter Date: 08/22/2014      PT End of Session - 08/22/14 0950    Visit Number 13   Number of Visits 24   Date for PT Re-Evaluation 08/15/13   PT Start Time 0845   PT Stop Time 0945   PT Time Calculation (min) 60 min      Past Medical History  Diagnosis Date  . Anterior myocardial infarction     ST-elevation; S/P emergent  drug-eluting stenting of proximal left anterior descending  . Renal insufficiency   . Hypertension   . Alcohol abuse   . Coronary artery disease     Cath October 2012-moderate disease in RCA, Circumflex    Past Surgical History  Procedure Laterality Date  . Doppler echocardiography      Preserved left venticular function  . Leg surgery      left leg - has rod and 4 pins  . Patellar tendon repair Right 04/26/2014    Procedure: RIGHT PATELLA TENDON REPAIR;  Surgeon: Sheral Apley, MD;  Location: [facility];  Service: Orthopedics;  Laterality: Right;  . Colonoscopy N/A 08/16/2014    Procedure: COLONOSCOPY;  Surgeon: West Bali, MD;  [facility];  Service: Endoscopy;  Laterality: N/A;  10:15 AM    There were no vitals taken for this visit.  Visit Diagnosis:  Stiffness of knee joint, right  Edema  Weakness of left leg  Difficulty walking      Subjective Assessment - 08/22/14 1028    Symptoms I think I can get my knee straighter now.           [facility] PT Assessment - 08/22/14 0923    AROM   Right Knee Extension -18  ranges between -25 t0 -18 at best                  [facility] Adult PT Treatment/Exercise - 08/22/14 0908    Knee/Hip Exercises: Aerobic   Stationary Bike Bike 5 minutes level 2   Knee/Hip Exercises: Seated   Long Arc Quad Right;2 sets;20 reps  Assist required to reach full ext   [employer] Limitations strap used around table to assist in decreasing hip flexion   Knee/Hip Exercises: Supine   Quad Sets Strengthening;Right;4 sets;20 reps   Straight Leg Raises AROM;1 set;10 reps   Straight Leg Raises Limitations -18 quad lag  with [place] Stim 10 on, 10 sec off   Programme researcher, broadcasting/film/video Location R quad/VMO   Engineer, manufacturing Russian  10 on/ 10 off cycle time   Electronics engineer                  PT Short Term Goals - 08/08/14 1015    PT SHORT TERM GOAL #1   Title Independent with inital HEP affter review   Time 1   Period Weeks   Status Achieved   PT SHORT TERM GOAL #2   Title Improve active RT knee extension to -20 degrees   Time 4   Period Weeks   Status On-going   PT SHORT TERM GOAL #3   Title Improve active Rt knee flexion to 110  degrees   Time 4   Period Weeks   Status Achieved   PT SHORT TERM GOAL #4   Title report pain decr 25% or more   Time 4   Period Weeks   Status Achieved   PT SHORT TERM GOAL #5   Title walk without device in home safely and out of [facility] with [facility] as able   Time 4   Period Weeks   Status On-going           PT Long Term Goals - 08/08/14 1015    PT LONG TERM GOAL #1   Title Independent with HEP as issued by last visit   Time 12   Period Weeks   Status On-going   PT LONG TERM GOAL #2   Title Report pain 1-2/10 max with walking community distances   Time 12   Period Weeks   Status On-going   PT LONG TERM GOAL #3   Title Walk with no device in home and community distances   Time 12   Period Weeks   Status On-going   PT LONG TERM GOAL #4   Title Walk stairs with one rail step over step   Time 12   Period Weeks   Status On-going   PT LONG TERM GOAL #5   Title Active RT knee range equal LT    Time  12   Period Weeks   Status On-going   PT LONG TERM GOAL #6   Title return to normla home tasks without limits   Time 12   Period Weeks   Status On-going               Plan - 08/22/14 0951    Clinical Impression Statement Decreased quad lag to -18 by end of treatment. Pt needs max verbal and tactile  cues to decrease compensation from hip flexors during terminal knee extension.    PT Next Visit Plan quad strength, rom, continue VMS, needs ERO        Problem List Patient Active Problem List   Diagnosis Date Noted  . Special screening for malignant neoplasms, colon   . Cervical stenosis of spine 05/16/2014  . Essential hypertension 05/16/2014  . Numbness and tingling of right arm 11/28/2013  . Dental abscess 04/26/2013  . Dysuria 04/26/2013  . HTN (hypertension) 04/04/2013  . CAD (coronary artery disease) 05/25/2011    Sherrie Mustacheonoho, Fraidy Mccarrick McGee, PTA 08/22/2014, 10:29 AM  [facility] [address] [place], [place], [address] Phone: [phone]   Fax:  [phone]

## 2014-08-26 NOTE — Telephone Encounter (Signed)
I spoke with [place] and me and Michael [name] has been approved for 60 days of Crestor pending Medicaid denial. This was shipped to Michael [name]'s home on August 19, 2014.  I placed call to Michael [name] and left message to call back

## 2014-08-27 ENCOUNTER — Ambulatory Visit: Payer: No Typology Code available for payment source | Admitting: Physical Therapy

## 2014-08-27 DIAGNOSIS — R262 Difficulty in walking, not elsewhere classified: Secondary | ICD-10-CM

## 2014-08-27 DIAGNOSIS — M25661 Stiffness of right knee, not elsewhere classified: Secondary | ICD-10-CM

## 2014-08-27 DIAGNOSIS — R29898 Other symptoms and signs involving the musculoskeletal system: Secondary | ICD-10-CM

## 2014-08-27 MED ORDER — ROSUVASTATIN CALCIUM 20 MG PO TABS: 20.0000 mg | ORAL_TABLET | Freq: Every day | ORAL | Status: DC

## 2014-08-27 NOTE — Therapy (Signed)
[facility] Outpatient Rehabilitation [facility] [address] [place], [place], [address] Phone: [phone]   Fax:  [phone]  Physical Therapy Treatment  Patient Details  Name: Michael [name] MRN: [id number] Date of Birth: 1960/11/24 Referring Provider:  Quentin Angst, MD  Encounter Date: 08/27/2014      PT End of Session - 08/27/14 0926    Visit Number 14   Number of Visits 24   Date for PT Re-Evaluation 09/16/14   PT Start Time 0845   PT Stop Time 0940   PT Time Calculation (min) 55 min   Activity Tolerance Patient tolerated treatment well   Behavior During Therapy [facility] for tasks assessed/performed      Past Medical History  Diagnosis Date  . Anterior myocardial infarction     ST-elevation; S/P emergent  drug-eluting stenting of proximal left anterior descending  . Renal insufficiency   . Hypertension   . Alcohol abuse   . Coronary artery disease     Cath October 2012-moderate disease in RCA, Circumflex    Past Surgical History  Procedure Laterality Date  . Doppler echocardiography      Preserved left venticular function  . Leg surgery      left leg - has rod and 4 pins  . Patellar tendon repair Right 04/26/2014    Procedure: RIGHT PATELLA TENDON REPAIR;  Surgeon: Sheral Apley, MD;  Location: [facility];  Service: Orthopedics;  Laterality: Right;  . Colonoscopy N/A 08/16/2014    Procedure: COLONOSCOPY;  Surgeon: West Bali, MD;  [facility];  Service: Endoscopy;  Laterality: N/A;  10:15 AM    There were no vitals filed for this visit.  Visit Diagnosis:  Stiffness of knee joint, right  Weakness of left leg  Difficulty walking      Subjective Assessment - 08/27/14 0847    Symptoms pt reports "things have been going pretty good".  He states he has been exercising on it and moving around.    Currently in Pain? Yes   Pain Score 2    Pain Location Knee   Pain Orientation Right   Pain Descriptors  / Indicators Aching;Dull   Pain Type Surgical pain   Pain Onset More than a month ago   Pain Frequency Intermittent                       [facility] Adult PT Treatment/Exercise - 08/27/14 0848    Ambulation/Gait   Ambulation/Gait Yes   Ambulation/Gait Assistance 5: Supervision   Ambulation/Gait Assistance Details 50  x 2   Gait Comments working on exaggerated heel strike to assist with R knee extension   Knee/Hip Exercises: Stretches   Hip Flexor Stretch 30 seconds;2 reps  PNF with 10 sec hold/relax    Knee/Hip Exercises: Aerobic   Stationary Bike Bike L3 8 min   Knee/Hip Exercises: Standing   Terminal Knee Extension AROM;Strengthening;Right;2 sets;20 reps;Theraband   Theraband Level (Terminal Knee Extension) Level 2 (Red)  with weight shift to the R with knee extension   Other Standing Knee Exercises sit-stand  x 10   Knee/Hip Exercises: Seated   Long Arc Quad Right;2 sets;20 reps   Knee/Hip Exercises: Supine   Quad Sets Strengthening;Right;4 sets;20 reps   Straight Leg Raises AROM;1 set;10 reps   Straight Leg Raises Limitations -16 quad lag   Modalities   Modalities Electrical Stimulation   Electrical Stimulation   Electrical Stimulation Location R quad/VMO  Engineer, manufacturinglectrical Stimulation Action Russian  10/10 50% duty cycle   Academic librarianlectrical Stimulation Parameters 42   Electrical Stimulation Goals Strength                PT Education - 08/27/14 1343    Education provided Yes   Education Details continue with HEP, ambulating with exagerated heel strike to assisted with knee extension   Person(s) Educated Patient   Methods Explanation   Comprehension Verbalized understanding;Returned demonstration          PT Short Term Goals - 08/27/14 1344    PT SHORT TERM GOAL #1   Title Independent with inital HEP affter review   Time 1   Period Weeks   Status Achieved   PT SHORT TERM GOAL #2   Title Improve active RT knee extension to -20 degrees   Time 4    Period Weeks   Status Achieved   PT SHORT TERM GOAL #3   Title Improve active Rt knee flexion to 110 degrees   Time 4   Period Weeks   Status Achieved   PT SHORT TERM GOAL #4   Title report pain decr 25% or more   Time 4   Period Weeks   Status Achieved   PT SHORT TERM GOAL #5   Title walk without device in home safely and [facility]   Time 4   Period Weeks   Status On-going           PT Long Term Goals - 08/27/14 1345    PT LONG TERM GOAL #1   Title Independent with HEP as issued by last visit   Time 12   Period Weeks   Status On-going   PT LONG TERM GOAL #2   Title Report pain 1-2/10 max with walking community distances   Time 12   Period Weeks   Status On-going   PT LONG TERM GOAL #3   Title Walk with no device in home and community distances   Time 12   Period Weeks   Status On-going   PT LONG TERM GOAL #4   Title Walk stairs with one rail step over step   Time 12   Period Weeks   Status On-going   PT LONG TERM GOAL #5   Title Active RT knee range equal LT    Time 12   Period Weeks   Status On-going   PT LONG TERM GOAL #6   Title return to normla home tasks without limits   Time 12   Period Weeks   Status On-going               Plan - 08/27/14 0926    Clinical Impression Statement Decreased quad lag to -16 at the end of today's treatment. pt continues to require mid-mod assist with SAQ and SLR.  During TKE and exagerrated heel strike gait he reported feeling good and able to perform the exercises better.    PT Next Visit Plan Quad strength, VMS/russian,    PT Home Exercise Plan gait with exaggerated heel strike to assist with knee extension.    Consulted and Agree with Plan of Care Patient        Problem List Patient Active Problem List   Diagnosis Date Noted  . Special screening for malignant neoplasms, colon   . Cervical stenosis of spine 05/16/2014  . Essential hypertension 05/16/2014  . Numbness and tingling of  right arm 11/28/2013  . Dental  abscess 04/26/2013  . Dysuria 04/26/2013  . HTN (hypertension) 04/04/2013  . CAD (coronary artery disease) 05/25/2011   Lulu Riding PT, DPT, LAT, ATC  08/27/2014  1:48 PM   [facility] Outpatient Rehabilitation [facility] [address] [place], [place], [address] Phone: [phone]   Fax:  [phone]

## 2014-08-27 NOTE — Telephone Encounter (Signed)
Spoke with pt and he received Crestor yesterday. He is aware to stop Lovastatin and start Crestor 20 mg daily.  He will let me know if approved for Medicaid.

## 2014-08-29 ENCOUNTER — Ambulatory Visit: Payer: No Typology Code available for payment source | Admitting: Rehabilitation

## 2014-08-29 DIAGNOSIS — R609 Edema, unspecified: Secondary | ICD-10-CM

## 2014-08-29 DIAGNOSIS — R29898 Other symptoms and signs involving the musculoskeletal system: Secondary | ICD-10-CM

## 2014-08-29 DIAGNOSIS — R262 Difficulty in walking, not elsewhere classified: Secondary | ICD-10-CM

## 2014-08-29 DIAGNOSIS — M25661 Stiffness of right knee, not elsewhere classified: Secondary | ICD-10-CM

## 2014-08-29 NOTE — Therapy (Signed)
[facility] [address] [place], [place], [address] Phone: [phone]   Fax:  [phone]  Physical Therapy Treatment  Patient Details  Name: Michael EhlersGary S Littlefield MRN: [id number] Date of Birth: 07/20/1960 Referring Provider:  [name], Olugbemiga E, MD  Encounter Date: 08/29/2014      PT End of Session - 08/29/14 1304    Visit Number 15   Number of Visits 24   Date for PT Re-Evaluation 09/16/14   PT Start Time 0845   PT Stop Time 0945   PT Time Calculation (min) 60 min      Past Medical History  Diagnosis Date  . Anterior myocardial infarction     ST-elevation; S/P emergent  drug-eluting stenting of proximal left anterior descending  . Renal insufficiency   . Hypertension   . Alcohol abuse   . Coronary artery disease     Cath October 2012-moderate disease in RCA, Circumflex    Past Surgical History  Procedure Laterality Date  . Doppler echocardiography      Preserved left venticular function  . Leg surgery      left leg - has rod and 4 pins  . Patellar tendon repair Right 04/26/2014    Procedure: RIGHT PATELLA TENDON REPAIR;  Surgeon: Sheral Apleyimothy D Murphy, MD;  Location: [facility];  Service: Orthopedics;  Laterality: Right;  . Colonoscopy N/A 08/16/2014    Procedure: COLONOSCOPY;  Surgeon: West BaliSandi L Fields, MD;  [facility];  Service: Endoscopy;  Laterality: N/A;  10:15 AM    There were no vitals filed for this visit.  Visit Diagnosis:  Stiffness of knee joint, right  Weakness of left leg  Difficulty walking  Edema      Subjective Assessment - 08/29/14 0902    Symptoms I've been trying to walk better, heel/toe.    Currently in Pain? No/denies            [facility] PT Assessment - 08/29/14 0906    AROM   Right Knee Extension -18  fatigues and increases to -25   Strength   Strength Assessment Site Hip   Right/Left Hip Right;Left   Right Hip Flexion 4-/5   Left Hip Flexion 5/5   Left Hip ABduction 5/5   Right/Left Knee Right   Right Knee Flexion 4+/5   Right Knee Extension 2-/5                   [facility] Adult PT Treatment/Exercise - 08/29/14 0907    Knee/Hip Exercises: Seated   Long Arc Quad Strengthening;Right;2 sets;10 reps   Long Arc Quad Weight 3 lbs.   Knee/Hip Exercises: Supine   Quad Sets 10 reps   Straight Leg Raises AROM;Right;3 sets;10 reps   Straight Leg Raises Limitations -18    Other Supine Knee Exercises Right march 3# 10x3   Knee/Hip Exercises: Sidelying   Hip ABduction Strengthening;Right;2 sets;10 reps   Knee/Hip Exercises: Prone   Hamstring Curl 2 sets;15 reps   Hamstring Curl Limitations 3#   Electrical Stimulation   Electrical Stimulation Location R quad/VMO   Electrical Stimulation Action Russian  18   Electrical Stimulation Parameters 18   Electrical Stimulation Goals Strength                  PT Short Term Goals - 08/27/14 1344    PT SHORT TERM GOAL #1   Title Independent with inital HEP affter review   Time 1   Period Weeks  Status Achieved   PT SHORT TERM GOAL #2   Title Improve active RT knee extension to -20 degrees   Time 4   Period Weeks   Status Achieved   PT SHORT TERM GOAL #3   Title Improve active Rt knee flexion to 110 degrees   Time 4   Period Weeks   Status Achieved   PT SHORT TERM GOAL #4   Title report pain decr 25% or more   Time 4   Period Weeks   Status Achieved   PT SHORT TERM GOAL #5   Title walk without device in home safely and out of home with [facility] as able   Time 4   Period Weeks   Status On-going           PT Long Term Goals - 08/27/14 1345    PT LONG TERM GOAL #1   Title Independent with HEP as issued by last visit   Time 12   Period Weeks   Status On-going   PT LONG TERM GOAL #2   Title Report pain 1-2/10 max with walking community distances   Time 12   Period Weeks   Status On-going   PT LONG TERM GOAL #3   Title Walk with no device in home and  community distances   Time 12   Period Weeks   Status On-going   PT LONG TERM GOAL #4   Title Walk stairs with one rail step over step   Time 12   Period Weeks   Status On-going   PT LONG TERM GOAL #5   Title Active RT knee range equal LT    Time 12   Period Weeks   Status On-going   PT LONG TERM GOAL #6   Title return to normla home tasks without limits   Time 12   Period Weeks   Status On-going               Plan - 08/29/14 1049    Clinical Impression Statement Hip and hamstring strength improved. Hip flexion and knee extension still weak. Slow progress with quad lag.    PT Next Visit Plan Quad strength, VMS/russian, hip flexion strength        Problem List Patient Active Problem List   Diagnosis Date Noted  . Special screening for malignant neoplasms, colon   . Cervical stenosis of spine 05/16/2014  . Essential hypertension 05/16/2014  . Numbness and tingling of right arm 11/28/2013  . Dental abscess 04/26/2013  . Dysuria 04/26/2013  . HTN (hypertension) 04/04/2013  . CAD (coronary artery disease) 05/25/2011    Sherrie Mustache, PTA 08/29/2014, 1:04 PM  [facility] [address] [place], [place], [address] Phone: [phone]   Fax:  [phone]

## 2014-09-03 ENCOUNTER — Ambulatory Visit: Payer: No Typology Code available for payment source | Admitting: Physical Therapy

## 2014-09-03 ENCOUNTER — Ambulatory Visit: Payer: No Typology Code available for payment source | Attending: Internal Medicine | Admitting: Internal Medicine

## 2014-09-03 ENCOUNTER — Encounter: Payer: Self-pay | Admitting: Internal Medicine

## 2014-09-03 VITALS — BP 135/71 | HR 57 | Temp 98.3°F | Resp 16 | Ht 68.0 in | Wt 227.0 lb

## 2014-09-03 DIAGNOSIS — I10 Essential (primary) hypertension: Secondary | ICD-10-CM | POA: Insufficient documentation

## 2014-09-03 DIAGNOSIS — R350 Frequency of micturition: Secondary | ICD-10-CM

## 2014-09-03 DIAGNOSIS — R1030 Lower abdominal pain, unspecified: Secondary | ICD-10-CM

## 2014-09-03 HISTORY — DX: Frequency of micturition: R35.0

## 2014-09-03 HISTORY — DX: Lower abdominal pain, unspecified: R10.30

## 2014-09-03 LAB — POCT URINALYSIS DIPSTICK
BILIRUBIN UA: NEGATIVE
GLUCOSE UA: NEGATIVE
Ketones, UA: NEGATIVE
LEUKOCYTES UA: NEGATIVE
NITRITE UA: NEGATIVE
Protein, UA: NEGATIVE
Spec Grav, UA: 1.025
Urobilinogen, UA: 0.2
pH, UA: 5.5

## 2014-09-03 LAB — COMPLETE METABOLIC PANEL WITH GFR
ALT: 23 U/L (ref 0–53)
AST: 18 U/L (ref 0–37)
Albumin: 4.3 g/dL (ref 3.5–5.2)
Alkaline Phosphatase: 92 U/L (ref 39–117)
BILIRUBIN TOTAL: 0.4 mg/dL (ref 0.2–1.2)
BUN: 20 mg/dL (ref 6–23)
CO2: 28 mEq/L (ref 19–32)
CREATININE: 1.08 mg/dL (ref 0.50–1.35)
Calcium: 9.6 mg/dL (ref 8.4–10.5)
Chloride: 104 mEq/L (ref 96–112)
GFR, EST NON AFRICAN AMERICAN: 78 mL/min
GFR, Est African American: >89 mL/min
GLUCOSE: 89 mg/dL (ref 70–99)
Potassium: 5.2 mEq/L (ref 3.5–5.3)
SODIUM: 137 meq/L (ref 135–145)
Total Protein: 7.4 g/dL (ref 6.0–8.3)

## 2014-09-03 LAB — CBC WITH DIFFERENTIAL/PLATELET
BASOS PCT: 0 % (ref 0–1)
Basophils Absolute: 0 10*3/uL (ref 0.0–0.1)
Eosinophils Absolute: 0 10*3/uL (ref 0.0–0.7)
Eosinophils Relative: 1 % (ref 0–5)
HCT: 44.9 % (ref 39.0–52.0)
HEMOGLOBIN: 15 g/dL (ref 13.0–17.0)
LYMPHS ABS: 1.8 10*3/uL (ref 0.7–4.0)
Lymphocytes Relative: 44 % (ref 12–46)
MCH: 27.6 pg (ref 26.0–34.0)
MCHC: 33.4 g/dL (ref 30.0–36.0)
MCV: 82.5 fL (ref 78.0–100.0)
MONO ABS: 0.5 10*3/uL (ref 0.1–1.0)
MONOS PCT: 11 % (ref 3–12)
MPV: 11.9 fL (ref 8.6–12.4)
Neutro Abs: 1.8 10*3/uL (ref 1.7–7.7)
Neutrophils Relative %: 44 % (ref 43–77)
Platelets: 239 10*3/uL (ref 150–400)
RBC: 5.44 MIL/uL (ref 4.22–5.81)
RDW: 13.6 % (ref 11.5–15.5)
WBC: 4.2 10*3/uL (ref 4.0–10.5)

## 2014-09-03 LAB — LIPID PANEL
Cholesterol: 196 mg/dL (ref 0–200)
HDL: 34 mg/dL — ABNORMAL LOW (ref >=40)
LDL Cholesterol: 146 mg/dL — ABNORMAL HIGH (ref 0–99)
Total CHOL/HDL Ratio: 5.8 Ratio
Triglycerides: 82 mg/dL (ref <150)
VLDL: 16 mg/dL (ref 0–40)

## 2014-09-03 LAB — POCT GLYCOSYLATED HEMOGLOBIN (HGB A1C): Hemoglobin A1C: 5.9

## 2014-09-03 LAB — TSH: TSH: 1.022 u[IU]/mL (ref 0.350–4.500)

## 2014-09-03 NOTE — Progress Notes (Signed)
Patient ID: Michael [name], male   DOB: 1961/01/30, 54 y.o.   MRN: [id number]   Michael [name], is a 54 y.o. male  [id number]  [id number]  DOB - January 31, 1961  Chief Complaint  Patient presents with  . Follow-up  . Coronary Artery Disease  . Hypertension        Subjective:   Michael [name] is a 54 y.o. male here today for a follow up visit. Patient with history of coronary artery disease, hypertension, dyslipidemia, here today with major complaint of lower abdominal pain was especially accompanied coughing and sneezing. Associated with frequent urination. Patient said he was told before that he has a spot on his kidneys and was advised to get them checked out regularly. He has no nausea, vomiting, diarrhea or constipation, no dysuria, no nocturia, no urgency. Frequent urination is also accompanied by polyuria. There is no bulging or protrusion in the groin area. Patient has No headache, No chest pain, No new weakness tingling or numbness. Patient does not smoke cigarettes, does not drink alcohol.  Problem  Frequent Urination  Lower Abdominal Pain    ALLERGIES: Allergies  Allergen Reactions  . Ivp Dye [Iodinated Diagnostic Agents] Swelling    PAST MEDICAL HISTORY: Past Medical History  Diagnosis Date  . Anterior myocardial infarction     ST-elevation; S/P emergent  drug-eluting stenting of proximal left anterior descending  . Renal insufficiency   . Hypertension   . Alcohol abuse   . Coronary artery disease     Cath October 2012-moderate disease in RCA, Circumflex    MEDICATIONS AT HOME: Prior to Admission medications   Medication Sig Start Date End Date Taking? Authorizing Provider  aspirin EC 81 MG tablet Take 81 mg by mouth daily.     Yes Historical Provider, MD  lisinopril (PRINIVIL,ZESTRIL) 20 MG tablet TAKE ONE TABLET BY MOUTH ONCE DAILY 01/14/14  Yes Kathleene Hazel, MD  metoprolol (LOPRESSOR) 50 MG tablet TAKE ONE TABLET BY MOUTH TWICE DAILY 01/14/14   Yes Kathleene Hazel, MD  rosuvastatin (CRESTOR) 20 MG tablet Take 1 tablet (20 mg total) by mouth daily. 08/27/14  Yes Kathleene Hazel, MD  acetaminophen (TYLENOL) 500 MG chewable tablet Chew 500 mg by mouth every 6 (six) hours as needed for pain.    Historical Provider, MD  methocarbamol (ROBAXIN) 500 MG tablet Take 1 tablet (500 mg total) by mouth every 6 (six) hours as needed. Patient not taking: Reported on 09/03/2014 04/26/14   Sheral Apley, MD  nitroGLYCERIN (NITROSTAT) 0.4 MG SL tablet Place 0.4 mg under the tongue every 5 (five) minutes as needed for chest pain.    Historical Provider, MD     Objective:   Filed Vitals:   09/03/14 0946  BP: 135/71  Pulse: 57  Temp: 98.3 F (36.8 C)  TempSrc: Oral  Resp: 16  Height:  (1.727 m)  Weight: 227 lb (102.967 kg)  SpO2: 99%    Exam General appearance : Awake, alert, not in any distress. Speech Clear. Not toxic looking HEENT: Atraumatic and Normocephalic, pupils equally reactive to light and accomodation Neck: supple, no JVD. No cervical lymphadenopathy.  Chest:Good air entry bilaterally, no added sounds  CVS: S1 S2 regular, no murmurs.  Abdomen: Bowel sounds present, Non tender and not distended with no gaurding, rigidity or rebound. Extremities: B/L Lower Ext shows no edema, both legs are warm to touch Neurology: Awake alert, and oriented X 3, CN II-XII intact, Non focal Skin:No Rash Rectal examination: Normal  external sphincteric tone, prostate felt, smooth, not significantly enlarged.  Data Review Lab Results  Component Value Date   HGBA1C 5.6 05/18/2013     Assessment & Plan   1. Frequent urination  - Urinalysis Dipstick - CBC with Differential/Platelet - POCT glycosylated hemoglobin (Hb A1C)  Urinalysis is negative for infection, digital rectal examination was negative for prostatic enlargement Will await abdominal ultrasound report  2. Essential hypertension: Controlled  - COMPLETE  METABOLIC PANEL WITH GFR - Lipid panel - TSH  - We have discussed target BP range and blood pressure goal - I have advised patient to check BP regularly and to call us back or report to clinic if the numbers are consistently higher than 140/90  - We discussed the importance of compliance with medical therapy and DASH diet recommended, consequences of uncontrolled hypertension discussed.  - continue current BP medications  3. Lower abdominal pain  - US Abdomen Complete; Future   Patient have been counseled extensively about nutrition and exercise  Return in about 3 months (around 12/04/2014), or if symptoms worsen or fail to improve, for Follow up HTN, CAD, Annual Physical.  The patient was given clear instructions to go to ER or return to medical center if symptoms don't improve, worsen or new problems develop. The patient verbalized understanding. The patient was told to call to get lab results if they haven't heard anything in the next week.   This note has been created with Education officer, environmentalDragon speech recognition software and smart phrase technology. Any transcriptional errors are unintentional.    Jeanann LewandowskyJEGEDE, Ardra Kuznicki, MD, MHA, CPE, FACP, FAAP [facility] [place], [place] [phone]   09/03/2014, 10:25 AM

## 2014-09-03 NOTE — Progress Notes (Signed)
Pt here to f/u with controlled HTN, CAD with med management Pt is c/o frequent urination with lower abdominal pain with cough or sneeze Denies hematuria or chills,fever VSS Compliant with taking all medications

## 2014-09-03 NOTE — Patient Instructions (Signed)
DASH Eating Plan DASH stands for "Dietary Approaches to Stop Hypertension." The DASH eating plan is a healthy eating plan that has been shown to reduce high blood pressure (hypertension). Additional health benefits may include reducing the risk of type 2 diabetes mellitus, heart disease, and stroke. The DASH eating plan may also help with weight loss. WHAT DO I NEED TO KNOW ABOUT THE DASH EATING PLAN? For the DASH eating plan, you will follow these general guidelines:  Choose foods with a percent daily value for sodium of less than 5% (as listed on the food label).  Use salt-free seasonings or herbs instead of table salt or sea salt.  Check with your health care provider or pharmacist before using salt substitutes.  Eat lower-sodium products, often labeled as "lower sodium" or "no salt added."  Eat fresh foods.  Eat more vegetables, fruits, and low-fat dairy products.  Choose whole grains. Look for the word "whole" as the first word in the ingredient list.  Choose fish and skinless chicken or turkey more often than red meat. Limit fish, poultry, and meat to 6 oz (170 g) each day.  Limit sweets, desserts, sugars, and sugary drinks.  Choose heart-healthy fats.  Limit cheese to 1 oz (28 g) per day.  Eat more home-cooked food and less restaurant, buffet, and fast food.  Limit fried foods.  Cook foods using methods other than frying.  Limit canned vegetables. If you do use them, rinse them well to decrease the sodium.  When eating at a restaurant, ask that your food be prepared with less salt, or no salt if possible. WHAT FOODS CAN I EAT? Seek help from a dietitian for individual calorie needs. Grains Whole grain or whole wheat bread. Brown rice. Whole grain or whole wheat pasta. Quinoa, bulgur, and whole grain cereals. Low-sodium cereals. Corn or whole wheat flour tortillas. Whole grain cornbread. Whole grain crackers. Low-sodium crackers. Vegetables Fresh or frozen vegetables  (raw, steamed, roasted, or grilled). Low-sodium or reduced-sodium tomato and vegetable juices. Low-sodium or reduced-sodium tomato sauce and paste. Low-sodium or reduced-sodium canned vegetables.  Fruits All fresh, canned (in natural juice), or frozen fruits. Meat and Other Protein Products Ground beef (85% or leaner), grass-fed beef, or beef trimmed of fat. Skinless chicken or turkey. Ground chicken or turkey. Pork trimmed of fat. All fish and seafood. Eggs. Dried beans, peas, or lentils. Unsalted nuts and seeds. Unsalted canned beans. Dairy Low-fat dairy products, such as skim or 1% milk, 2% or reduced-fat cheeses, low-fat ricotta or cottage cheese, or plain low-fat yogurt. Low-sodium or reduced-sodium cheeses. Fats and Oils Tub margarines without trans fats. Light or reduced-fat mayonnaise and salad dressings (reduced sodium). Avocado. Safflower, olive, or canola oils. Natural peanut or almond butter. Other Unsalted popcorn and pretzels. The items listed above may not be a complete list of recommended foods or beverages. Contact your dietitian for more options. WHAT FOODS ARE NOT RECOMMENDED? Grains White bread. White pasta. White rice. Refined cornbread. Bagels and croissants. Crackers that contain trans fat. Vegetables Creamed or fried vegetables. Vegetables in a cheese sauce. Regular canned vegetables. Regular canned tomato sauce and paste. Regular tomato and vegetable juices. Fruits Dried fruits. Canned fruit in light or heavy syrup. Fruit juice. Meat and Other Protein Products Fatty cuts of meat. Ribs, chicken wings, bacon, sausage, bologna, salami, chitterlings, fatback, hot dogs, bratwurst, and packaged luncheon meats. Salted nuts and seeds. Canned beans with salt. Dairy Whole or 2% milk, cream, half-and-half, and cream cheese. Whole-fat or sweetened yogurt. Full-fat   cheeses or blue cheese. Nondairy creamers and whipped toppings. Processed cheese, cheese spreads, or cheese  curds. Condiments Onion and garlic salt, seasoned salt, table salt, and sea salt. Canned and packaged gravies. Worcestershire sauce. Tartar sauce. Barbecue sauce. Teriyaki sauce. Soy sauce, including reduced sodium. Steak sauce. Fish sauce. Oyster sauce. Cocktail sauce. Horseradish. Ketchup and mustard. Meat flavorings and tenderizers. Bouillon cubes. Hot sauce. Tabasco sauce. Marinades. Taco seasonings. Relishes. Fats and Oils Butter, stick margarine, lard, shortening, ghee, and bacon fat. Coconut, palm kernel, or palm oils. Regular salad dressings. Other Pickles and olives. Salted popcorn and pretzels. The items listed above may not be a complete list of foods and beverages to avoid. Contact your dietitian for more information. WHERE CAN I FIND MORE INFORMATION? National Heart, Lung, and Blood Institute: www.nhlbi.nih.gov/health/health-topics/topics/dash/ Document Released: 05/20/2011 Document Revised: 10/15/2013 Document Reviewed: 04/04/2013 ExitCare Patient Information 2015 [employer]. This information is not intended to replace advice given to you by your health care provider. Make sure you discuss any questions you have with your health care provider. Hypertension Hypertension, commonly called high blood pressure, is when the force of blood pumping through your arteries is too strong. Your arteries are the blood vessels that carry blood from your heart throughout your body. A blood pressure reading consists of a higher number over a lower number, such as 110/72. The higher number (systolic) is the pressure inside your arteries when your heart pumps. The lower number (diastolic) is the pressure inside your arteries when your heart relaxes. Ideally you want your blood pressure below 120/80. Hypertension forces your heart to work harder to pump blood. Your arteries may become narrow or stiff. Having hypertension puts you at risk for heart disease, stroke, and other problems.  RISK  FACTORS Some risk factors for high blood pressure are controllable. Others are not.  Risk factors you cannot control include:   Race. You may be at higher risk if you are African American.  Age. Risk increases with age.  Gender. Men are at higher risk than women before age 45 years. After age 65, women are at higher risk than men. Risk factors you can control include:  Not getting enough exercise or physical activity.  Being overweight.  Getting too much fat, sugar, calories, or salt in your diet.  Drinking too much alcohol. SIGNS AND SYMPTOMS Hypertension does not usually cause signs or symptoms. Extremely high blood pressure (hypertensive crisis) may cause headache, anxiety, shortness of breath, and nosebleed. DIAGNOSIS  To check if you have hypertension, your health care provider will measure your blood pressure while you are seated, with your arm held at the level of your heart. It should be measured at least twice using the same arm. Certain conditions can cause a difference in blood pressure between your right and left arms. A blood pressure reading that is higher than normal on one occasion does not mean that you need treatment. If one blood pressure reading is high, ask your health care provider about having it checked again. TREATMENT  Treating high blood pressure includes making lifestyle changes and possibly taking medicine. Living a healthy lifestyle can help lower high blood pressure. You may need to change some of your habits. Lifestyle changes may include:  Following the DASH diet. This diet is high in fruits, vegetables, and whole grains. It is low in salt, red meat, and added sugars.  Getting at least 2 hours of brisk physical activity every week.  Losing weight if necessary.  Not smoking.  Limiting   alcoholic beverages.  Learning ways to reduce stress. If lifestyle changes are not enough to get your blood pressure under control, your health care provider may  prescribe medicine. You may need to take more than one. Work closely with your health care provider to understand the risks and benefits. HOME CARE INSTRUCTIONS  Have your blood pressure rechecked as directed by your health care provider.   Take medicines only as directed by your health care provider. Follow the directions carefully. Blood pressure medicines must be taken as prescribed. The medicine does not work as well when you skip doses. Skipping doses also puts you at risk for problems.   Do not smoke.   Monitor your blood pressure at home as directed by your health care provider. SEEK MEDICAL CARE IF:   You think you are having a reaction to medicines taken.  You have recurrent headaches or feel dizzy.  You have swelling in your ankles.  You have trouble with your vision. SEEK IMMEDIATE MEDICAL CARE IF:  You develop a severe headache or confusion.  You have unusual weakness, numbness, or feel faint.  You have severe chest or abdominal pain.  You vomit repeatedly.  You have trouble breathing. MAKE SURE YOU:   Understand these instructions.  Will watch your condition.  Will get help right away if you are not doing well or get worse. Document Released: 05/31/2005 Document Revised: 10/15/2013 Document Reviewed: 03/23/2013 ExitCare Patient Information 2015 ExitCare, LLC. This information is not intended to replace advice given to you by your health care provider. Make sure you discuss any questions you have with your health care provider.  

## 2014-09-04 ENCOUNTER — Ambulatory Visit: Payer: No Typology Code available for payment source | Admitting: Physical Therapy

## 2014-09-04 DIAGNOSIS — R262 Difficulty in walking, not elsewhere classified: Secondary | ICD-10-CM

## 2014-09-04 DIAGNOSIS — M25661 Stiffness of right knee, not elsewhere classified: Secondary | ICD-10-CM

## 2014-09-04 DIAGNOSIS — R609 Edema, unspecified: Secondary | ICD-10-CM

## 2014-09-04 DIAGNOSIS — R29898 Other symptoms and signs involving the musculoskeletal system: Secondary | ICD-10-CM

## 2014-09-04 NOTE — Therapy (Signed)
[facility] [address] [place], [place], [address] Phone: [phone]   Fax:  [phone]  Physical Therapy Treatment  Patient Details  Name: Michael [name] MRN: [id number] Date of Birth: 11/21/1960 Referring Provider:  [name], Olugbemiga E, MD  Encounter Date: 09/04/2014      PT End of Session - 09/04/14 1022    Visit Number 16   Number of Visits 24   Date for PT Re-Evaluation 09/16/14   PT Start Time 0800   PT Stop Time 0845   PT Time Calculation (min) 45 min      Past Medical History  Diagnosis Date  . Anterior myocardial infarction     ST-elevation; S/P emergent  drug-eluting stenting of proximal left anterior descending  . Renal insufficiency   . Hypertension   . Alcohol abuse   . Coronary artery disease     Cath October 2012-moderate disease in RCA, Circumflex    Past Surgical History  Procedure Laterality Date  . Doppler echocardiography      Preserved left venticular function  . Leg surgery      left leg - has rod and 4 pins  . Patellar tendon repair Right 04/26/2014    Procedure: RIGHT PATELLA TENDON REPAIR;  Surgeon: Sheral Apleyimothy D Murphy, MD;  Location: [facility];  Service: Orthopedics;  Laterality: Right;  . Colonoscopy N/A 08/16/2014    Procedure: COLONOSCOPY;  Surgeon: West BaliSandi L Fields, MD;  [facility];  Service: Endoscopy;  Laterality: N/A;  10:15 AM    There were no vitals filed for this visit.  Visit Diagnosis:  Difficulty walking  Weakness of left leg  Edema  Stiffness of knee joint, right          [facility] PT Assessment - 09/04/14 0838    AROM   Right Knee Extension -22   Right Knee Flexion 120   Strength   Right Hip Flexion 4+/5                   [facility] Adult PT Treatment/Exercise - 09/04/14 0843    Knee/Hip Exercises: Aerobic   Stationary Bike Bike L3 8 min   Knee/Hip Exercises: Seated   Long Arc Quad Strengthening;Right;2 sets;10 reps   Long Arc Quad Limitations with VMO Russian STIM  and asssit to reach full extension by PTA   Knee/Hip Exercises: Supine   [employer]Quad Sets 10 reps   [employer] Tim[employer] CompanyQuad Sets Limitations with Guern[place]ian stim for quad activation   Short Arc [employer]Quad Sets Strengthening;Right;2 sets;10 reps   Short Arc [employer]Quad Sets Limitations with russian stim for quad activation as well as assist for full extension by PTA   Straight Leg Raises AROM;Right;3 sets;10 reps   Straight Leg Raises Limitations -22   Modalities   Modalities Insurance account managerlectrical Stimulation   Electrical Stimulation   Electrical Stimulation Location R quad/VMO   Heritage managerlectrical Stimulation Action Russian   Electrical Stimulation Parameters 18   Electrical Stimulation Goals Strength  SAQ, TKE, SLR and LAQ with assist   Knee/Hip Exercises: Machines for Strengthening   Cybex Leg Press 1 plate, 2 plate with ball squeeze x 20 each                  PT Short Term Goals - 08/27/14 1344    PT SHORT TERM GOAL #1   Title Independent with inital HEP affter review   Time 1   Period Weeks   Status Achieved   PT SHORT TERM GOAL #2  Title Improve active RT knee extension to -20 degrees   Time 4   Period Weeks   Status Achieved   PT SHORT TERM GOAL #3   Title Improve active Rt knee flexion to 110 degrees   Time 4   Period Weeks   Status Achieved   PT SHORT TERM GOAL #4   Title report pain decr 25% or more   Time 4   Period Weeks   Status Achieved   PT SHORT TERM GOAL #5   Title walk without device in home safely and out of home with [facility] as able   Time 4   Period Weeks   Status On-going           PT Long Term Goals - 08/27/14 1345    PT LONG TERM GOAL #1   Title Independent with HEP as issued by last visit   Time 12   Period Weeks   Status On-going   PT LONG TERM GOAL #2   Title Report pain 1-2/10 max with walking community distances   Time 12   Period Weeks   Status On-going   PT LONG TERM GOAL #3   Title Walk with no device in home and community  distances   Time 12   Period Weeks   Status On-going   PT LONG TERM GOAL #4   Title Walk stairs with one rail step over step   Time 12   Period Weeks   Status On-going   PT LONG TERM GOAL #5   Title Active RT knee range equal LT    Time 12   Period Weeks   Status On-going   PT LONG TERM GOAL #6   Title return to normla home tasks without limits   Time 12   Period Weeks   Status On-going               Plan - 09/04/14 1023    Clinical Impression Statement Hip flexion strength improved, knee flexion AROm improved. Knee extension strength still 2+/5. Pt exhibit2 -22 quad lag despite VMS treatments.    PT Next Visit Plan Quad strength, VMS/russian        Problem List Patient Active Problem List   Diagnosis Date Noted  . Frequent urination 09/03/2014  . Lower abdominal pain 09/03/2014  . Special screening for malignant neoplasms, colon   . Cervical stenosis of spine 05/16/2014  . Essential hypertension 05/16/2014  . Numbness and tingling of right arm 11/28/2013  . Dental abscess 04/26/2013  . Dysuria 04/26/2013  . HTN (hypertension) 04/04/2013  . CAD (coronary artery disease) 05/25/2011    Sherrie Mustache, PTA 09/04/2014, 10:25 AM  [facility] [address] [place], [place], [address] Phone: [phone]   Fax:  [phone]

## 2014-09-05 ENCOUNTER — Ambulatory Visit: Payer: No Typology Code available for payment source | Admitting: Physical Therapy

## 2014-09-05 DIAGNOSIS — R262 Difficulty in walking, not elsewhere classified: Secondary | ICD-10-CM

## 2014-09-05 DIAGNOSIS — R609 Edema, unspecified: Secondary | ICD-10-CM

## 2014-09-05 DIAGNOSIS — R29898 Other symptoms and signs involving the musculoskeletal system: Secondary | ICD-10-CM

## 2014-09-05 DIAGNOSIS — M25661 Stiffness of right knee, not elsewhere classified: Secondary | ICD-10-CM

## 2014-09-05 NOTE — Therapy (Signed)
[facility] [address] [place], [place], [address] Phone: [phone]   Fax:  [phone]  Physical Therapy Treatment  Patient Details  Name: Michael [name] MRN: [id number] Date of Birth: 04-25-1961 Referring Provider:  Quentin Angst, MD  Encounter Date: 09/05/2014      PT End of Session - 09/05/14 1014    Visit Number 17   Number of Visits 24   Date for PT Re-Evaluation 09/16/14   PT Start Time 0930   PT Stop Time 1020   PT Time Calculation (min) 50 min   Activity Tolerance Patient tolerated treatment well   Behavior During Therapy [facility] for tasks assessed/performed      Past Medical History  Diagnosis Date  . Anterior myocardial infarction     ST-elevation; S/P emergent  drug-eluting stenting of proximal left anterior descending  . Renal insufficiency   . Hypertension   . Alcohol abuse   . Coronary artery disease     Cath October 2012-moderate disease in RCA, Circumflex    Past Surgical History  Procedure Laterality Date  . Doppler echocardiography      Preserved left venticular function  . Leg surgery      left leg - has rod and 4 pins  . Patellar tendon repair Right 04/26/2014    Procedure: RIGHT PATELLA TENDON REPAIR;  Surgeon: Michael Apley, MD;  Location: [facility];  Service: Orthopedics;  Laterality: Right;  . Colonoscopy N/A 08/16/2014    Procedure: COLONOSCOPY;  Surgeon: Michael Bali, MD;  [facility];  Service: Endoscopy;  Laterality: N/A;  10:15 AM    There were no vitals filed for this visit.  Visit Diagnosis:  Weakness of left leg  Difficulty walking  Edema  Stiffness of knee joint, right      Subjective Assessment - 09/05/14 0935    Symptoms I've been feeling sore today, I think it is because I've come to PT back to back days.    Currently in Pain? Yes   Pain Score 2    Pain Location Knee   Pain Orientation Right   Pain Descriptors / Indicators  Aching;Dull   Pain Type Surgical pain   Pain Onset More than a month ago   Pain Frequency Intermittent            [facility] PT Assessment - 09/05/14 0001    AROM   Right Knee Extension -22  SLR lag   Right Knee Flexion 115                   [facility] Adult PT Treatment/Exercise - 09/05/14 0937    Knee/Hip Exercises: Stretches   Passive Hamstring Stretch 2 reps;30 seconds   Hip Flexor Stretch 2 reps;30 seconds  performed in supine with knee bent   Knee/Hip Exercises: Aerobic   Stationary Bike Bike L4 x 8 min   Knee/Hip Exercises: Machines for Strengthening   Cybex Leg Press 1 plate, 2 plate with ball squeeze x 20 each   Knee/Hip Exercises: Standing   Terminal Knee Extension AROM;Strengthening;Right;2 sets;20 reps;Theraband;Other (comment)  on 2 in step doing step up and weight shift with TKE   Theraband Level (Terminal Knee Extension) Level 3 (Green)   Knee/Hip Exercises: Supine   Straight Leg Raises AROM;Right;3 sets;10 reps;Other (comment)  with bolster under foot, doing quad set   Straight Leg Raises Limitations -22   Electrical Stimulation   Electrical Stimulation Location R quad/VMO   Electrical  Stimulation Action Academic librarian Parameters 42 intensity 10;10 @ 50% wtih 2 sec ramp  to patient tolerance with good visual quad contraction   Electrical Stimulation Goals Strength  VMO activation/ eduaction with SAQ                PT Education - 09/05/14 1014    Education provided Yes   Education Details continue with exaggerated heel strike ambulation   Person(s) Educated Patient   Methods Explanation   Comprehension Verbalized understanding          PT Short Term Goals - 08/27/14 1344    PT SHORT TERM GOAL #1   Title Independent with inital HEP affter review   Time 1   Period Weeks   Status Achieved   PT SHORT TERM GOAL #2   Title Improve active RT knee extension to -20 degrees   Time 4   Period Weeks   Status Achieved   PT  SHORT TERM GOAL #3   Title Improve active Rt knee flexion to 110 degrees   Time 4   Period Weeks   Status Achieved   PT SHORT TERM GOAL #4   Title report pain decr 25% or more   Time 4   Period Weeks   Status Achieved   PT SHORT TERM GOAL #5   Title walk without device in home safely and out of home with [facility] as able   Time 4   Period Weeks   Status On-going           PT Long Term Goals - 08/27/14 1345    PT LONG TERM GOAL #1   Title Independent with HEP as issued by last visit   Time 12   Period Weeks   Status On-going   PT LONG TERM GOAL #2   Title Report pain 1-2/10 max with walking community distances   Time 12   Period Weeks   Status On-going   PT LONG TERM GOAL #3   Title Walk with no device in home and community distances   Time 12   Period Weeks   Status On-going   PT LONG TERM GOAL #4   Title Walk stairs with one rail step over step   Time 12   Period Weeks   Status On-going   PT LONG TERM GOAL #5   Title Active RT knee range equal LT    Time 12   Period Weeks   Status On-going   PT LONG TERM GOAL #6   Title return to normla home tasks without limits   Time 12   Period Weeks   Status On-going               Plan - 09/05/14 1205    Clinical Impression Statement Michael [name] continues to demonstrate SLR quad lag that at -22 degrees initially that decreased to -18 following todays treatment.  Attempting to progress TKE and encorporating small step up to facilitate VMO firing/quad strength.   PT Next Visit Plan Quad strength, VMS/russian,    PT Home Exercise Plan same as previous   Consulted and Agree with Plan of Care Patient        Problem List Patient Active Problem List   Diagnosis Date Noted  . Frequent urination 09/03/2014  . Lower abdominal pain 09/03/2014  . Special screening for malignant neoplasms, colon   . Cervical stenosis of spine 05/16/2014  . Essential hypertension 05/16/2014  . Numbness and tingling of right arm  11/28/2013   . Dental abscess 04/26/2013  . Dysuria 04/26/2013  . HTN (hypertension) 04/04/2013  . CAD (coronary artery disease) 05/25/2011   Michael [name] PT, DPT, LAT, ATC  09/05/2014  12:08 PM   [facility] [address] [place], [place], [address] Phone: [phone]   Fax:  [phone]

## 2014-09-10 ENCOUNTER — Ambulatory Visit: Payer: No Typology Code available for payment source | Admitting: Physical Therapy

## 2014-09-10 DIAGNOSIS — M25661 Stiffness of right knee, not elsewhere classified: Secondary | ICD-10-CM

## 2014-09-10 DIAGNOSIS — R262 Difficulty in walking, not elsewhere classified: Secondary | ICD-10-CM

## 2014-09-10 DIAGNOSIS — R29898 Other symptoms and signs involving the musculoskeletal system: Secondary | ICD-10-CM

## 2014-09-10 DIAGNOSIS — R609 Edema, unspecified: Secondary | ICD-10-CM

## 2014-09-10 NOTE — Therapy (Signed)
[facility] [address] [place], [place], [address] Phone: [phone]   Fax:  [phone]  Physical Therapy Treatment  Patient Details  Name: Michael EhlersGary S [name] MRN: [id number] Date of Birth: 11/03/1960 Referring Provider:  [name], Olugbemiga E, MD  Encounter Date: 09/10/2014      PT End of Session - 09/10/14 0920    Visit Number 18   Number of Visits 24   Date for PT Re-Evaluation 09/16/14   PT Start Time 0848   PT Stop Time 0915   PT Time Calculation (min) 27 min      Past Medical History  Diagnosis Date  . Anterior myocardial infarction     ST-elevation; S/P emergent  drug-eluting stenting of proximal left anterior descending  . Renal insufficiency   . Hypertension   . Alcohol abuse   . Coronary artery disease     Cath October 2012-moderate disease in RCA, Circumflex    Past Surgical History  Procedure Laterality Date  . Doppler echocardiography      Preserved left venticular function  . Leg surgery      left leg - has rod and 4 pins  . Patellar tendon repair Right 04/26/2014    Procedure: RIGHT PATELLA TENDON REPAIR;  Surgeon: Sheral Apleyimothy D Murphy, MD;  Location: [facility];  Service: Orthopedics;  Laterality: Right;  . Colonoscopy N/A 08/16/2014    Procedure: COLONOSCOPY;  Surgeon: West BaliSandi L Fields, MD;  [facility];  Service: Endoscopy;  Laterality: N/A;  10:15 AM    There were no vitals filed for this visit.  Visit Diagnosis:  Weakness of left leg  Difficulty walking  Stiffness of knee joint, right  Edema      Subjective Assessment - 09/10/14 0919    Symptoms 2-3/10 pain right knee.    Currently in Pain? Yes   Aggravating Factors  first wake up, stiff   Pain Relieving Factors rest                       [facility] Adult PT Treatment/Exercise - 09/10/14 0001    Ambulation/Gait   Stairs Yes   Stair Management Technique Two rails;Step to pattern   Number of Stairs  6   Height of Stairs 4   Knee/Hip Exercises: Standing   Forward Step Up Right;10 reps   Forward Step Up Limitations requires cues for weight shift and needs bil UE support   Knee/Hip Exercises: Supine   Other Supine Knee Exercises Reformer-see PT note      Pilates Reformer used for LE/core strength, postural strength, lumbopelvic disassociation and core control.  Exercises included: Footwork: narrow and wide on heel and toes, 2 red springs, single and double leg. Offered ball between knees for alignment.  Bridging: 2 red 1 yellow spring for parallel bridges with ball, cues for segmental movement, core engagement and breathing throughout reformer work.         PT Short Term Goals - 08/27/14 1344    PT SHORT TERM GOAL #1   Title Independent with inital HEP affter review   Time 1   Period Weeks   Status Achieved   PT SHORT TERM GOAL #2   Title Improve active RT knee extension to -20 degrees   Time 4   Period Weeks   Status Achieved   PT SHORT TERM GOAL #3   Title Improve active Rt knee flexion to 110 degrees   Time 4   Period Weeks  Status Achieved   PT SHORT TERM GOAL #4   Title report pain decr 25% or more   Time 4   Period Weeks   Status Achieved   PT SHORT TERM GOAL #5   Title walk without device in home safely and out of home with [facility] as able   Time 4   Period Weeks   Status On-going           PT Long Term Goals - 08/27/14 1345    PT LONG TERM GOAL #1   Title Independent with HEP as issued by last visit   Time 12   Period Weeks   Status On-going   PT LONG TERM GOAL #2   Title Report pain 1-2/10 max with walking community distances   Time 12   Period Weeks   Status On-going   PT LONG TERM GOAL #3   Title Walk with no device in home and community distances   Time 12   Period Weeks   Status On-going   PT LONG TERM GOAL #4   Title Walk stairs with one rail step over step   Time 12   Period Weeks   Status On-going   PT LONG TERM GOAL #5   Title  Active RT knee range equal LT    Time 12   Period Weeks   Status On-going   PT LONG TERM GOAL #6   Title return to normla home tasks without limits   Time 12   Period Weeks   Status On-going               Plan - 09/10/14 0920    Clinical Impression Statement Pt repots decreased pain to 1/10 after foot work and bridge work on the Therapist, occupational. Pt is unable to perform single right step up on 4 or 6 inch step without max UE assist. Requires max cues for proper weight shift and knee alignment with step ups. Pt encouraged to continue to step to pattern with stairs at home due to weakness.    PT Next Visit Plan Quad strength, VMS/russian, continue reformer        Problem List Patient Active Problem List   Diagnosis Date Noted  . Frequent urination 09/03/2014  . Lower abdominal pain 09/03/2014  . Special screening for malignant neoplasms, colon   . Cervical stenosis of spine 05/16/2014  . Essential hypertension 05/16/2014  . Numbness and tingling of right arm 11/28/2013  . Dental abscess 04/26/2013  . Dysuria 04/26/2013  . HTN (hypertension) 04/04/2013  . CAD (coronary artery disease) 05/25/2011    Sherrie Mustache, PTA 09/10/2014, 9:30 AM  [facility] [address] [place], [place], [address] Phone: [phone]   Fax:  [phone]

## 2014-09-12 ENCOUNTER — Ambulatory Visit: Payer: No Typology Code available for payment source | Admitting: Physical Therapy

## 2014-09-12 DIAGNOSIS — M25661 Stiffness of right knee, not elsewhere classified: Secondary | ICD-10-CM

## 2014-09-12 DIAGNOSIS — R29898 Other symptoms and signs involving the musculoskeletal system: Secondary | ICD-10-CM

## 2014-09-12 DIAGNOSIS — R609 Edema, unspecified: Secondary | ICD-10-CM

## 2014-09-12 DIAGNOSIS — R262 Difficulty in walking, not elsewhere classified: Secondary | ICD-10-CM

## 2014-09-12 NOTE — Therapy (Signed)
[place], [place], [address] Phone: [phone]   Fax:  [phone]  Physical Therapy Treatment  Patient Details  Name: Michael [name] MRN: [id number] Date of Birth: 04-13-1961 Referring Provider:  Tresa Garter, MD  Encounter Date: 09/12/2014      PT End of Session - 09/12/14 0902    Visit Number 19   Number of Visits 24   Date for PT Re-Evaluation 09/16/14   PT Start Time 0850   PT Stop Time 0930   PT Time Calculation (min) 40 min      Past Medical History  Diagnosis Date  . Anterior myocardial infarction     ST-elevation; S/P emergent  drug-eluting stenting of proximal left anterior descending  . Renal insufficiency   . Hypertension   . Alcohol abuse   . Coronary artery disease     Cath October 2012-moderate disease in RCA, Circumflex    Past Surgical History  Procedure Laterality Date  . Doppler echocardiography      Preserved left venticular function  . Leg surgery      left leg - has rod and 4 pins  . Patellar tendon repair Right 04/26/2014    Procedure: RIGHT PATELLA TENDON REPAIR;  Surgeon: Renette Butters, MD;  Location: [facility];  Service: Orthopedics;  Laterality: Right;  . Colonoscopy N/A 08/16/2014    Procedure: COLONOSCOPY;  Surgeon: Danie Binder, MD;  [facility];  Service: Endoscopy;  Laterality: N/A;  10:15 AM    There were no vitals filed for this visit.  Visit Diagnosis:  Weakness of left leg  Difficulty walking  Stiffness of knee joint, right  Edema      Subjective Assessment - 09/12/14 0902    Symptoms 1-2/10 pain.  It felt good after the reformer exercises.                       [facility] Adult PT Treatment/Exercise - 09/12/14 1154    Knee/Hip Exercises: Aerobic   Stationary Bike Nustep L 6 LE only x 5 min   Knee/Hip Exercises: Standing   Terminal Knee Extension Strengthening;Right;2 sets;10 reps  3 way  knee control   Theraband Level (Terminal Knee Extension) Level 3 (Green)   Forward Step Up Right;2 sets;3 sets;10 reps;Hand Hold: 2;Hand Hold: 1;Step Height: 4";Step Height: 6"   Forward Step Up Limitations max cues to decrease compensation   Other Standing Knee Exercises in parallel bars SLS with slight knee bend with UE support, mini squats encouraging equal weight bearing.    Knee/Hip Exercises: Supine   Straight Leg Raises AROM;Right;2 sets;10 reps  1 set from elevated wedge    Straight Leg Raises Limitations -22                  PT Short Term Goals - 08/27/14 1344    PT SHORT TERM GOAL #1   Title Independent with inital HEP affter review   Time 1   Period Weeks   Status Achieved   PT SHORT TERM GOAL #2   Title Improve active RT knee extension to -20 degrees   Time 4   Period Weeks   Status Achieved   PT SHORT TERM GOAL #3   Title Improve active Rt knee flexion to 110 degrees   Time 4   Period Weeks   Status Achieved   PT SHORT TERM GOAL #4   Title report pain decr 25%  or more   Time 4   Period Weeks   Status Achieved   PT SHORT TERM GOAL #5   Title walk without device in home safely and out of home with [facility] as able   Time 4   Period Weeks   Status On-going           PT Long Term Goals - 08/27/14 1345    PT LONG TERM GOAL #1   Title Independent with HEP as issued by last visit   Time 12   Period Weeks   Status On-going   PT LONG TERM GOAL #2   Title Report pain 1-2/10 max with walking community distances   Time 12   Period Weeks   Status On-going   PT LONG TERM GOAL #3   Title Walk with no device in home and community distances   Time 12   Period Weeks   Status On-going   PT LONG TERM GOAL #4   Title Walk stairs with one rail step over step   Time 12   Period Weeks   Status On-going   PT LONG TERM GOAL #5   Title Active RT knee range equal LT    Time 12   Period Weeks   Status On-going   PT LONG TERM GOAL #6   Title return to normla  home tasks without limits   Time 12   Period Weeks   Status On-going               Plan - 09/12/14 4037    Clinical Impression Statement Max cues required to  decrease compensation with 4 inch step ups. Continued 24 degress quad lag with SLR despite strong VMO contraction. No additional goals met.   PT Next Visit Plan Quad strength, VMS/russian, continue reformer        Problem List Patient Active Problem List   Diagnosis Date Noted  . Frequent urination 09/03/2014  . Lower abdominal pain 09/03/2014  . Special screening for malignant neoplasms, colon   . Cervical stenosis of spine 05/16/2014  . Essential hypertension 05/16/2014  . Numbness and tingling of right arm 11/28/2013  . Dental abscess 04/26/2013  . Dysuria 04/26/2013  . HTN (hypertension) 04/04/2013  . CAD (coronary artery disease) 05/25/2011    Dorene Ar, PTA 09/12/2014, 11:59 AM  [facility] [address] [place], [place], [address] Phone: [phone]   Fax:  [phone]

## 2014-09-13 ENCOUNTER — Ambulatory Visit (HOSPITAL_COMMUNITY)
Admission: RE | Admit: 2014-09-13 | Discharge: 2014-09-13 | Disposition: A | Discharge status: Home / Self Care | Payer: Self-pay | Source: Ambulatory Visit | Attending: Internal Medicine | Admitting: Internal Medicine

## 2014-09-13 DIAGNOSIS — R1030 Lower abdominal pain, unspecified: Secondary | ICD-10-CM | POA: Insufficient documentation

## 2014-09-13 DIAGNOSIS — R350 Frequency of micturition: Secondary | ICD-10-CM | POA: Insufficient documentation

## 2014-09-17 ENCOUNTER — Ambulatory Visit: Payer: Medicaid Other | Attending: Internal Medicine | Admitting: Physical Therapy

## 2014-09-17 DIAGNOSIS — R262 Difficulty in walking, not elsewhere classified: Secondary | ICD-10-CM | POA: Diagnosis not present

## 2014-09-17 DIAGNOSIS — R29898 Other symptoms and signs involving the musculoskeletal system: Secondary | ICD-10-CM | POA: Diagnosis not present

## 2014-09-17 DIAGNOSIS — R609 Edema, unspecified: Secondary | ICD-10-CM

## 2014-09-17 DIAGNOSIS — M25661 Stiffness of right knee, not elsewhere classified: Secondary | ICD-10-CM

## 2014-09-17 NOTE — Therapy (Signed)
[facility] [address] [place], [place], [address] Phone: [phone]   Fax:  [phone]  Physical Therapy Treatment  Patient Details  Name: Michael [name] MRN: [id number] Date of Birth: 07/04/1960 Referring Provider:  Quentin [name], Michael E, MD  Encounter Date: 09/17/2014      PT End of Session - 09/17/14 1324    Visit Number 20   Number of Visits 36   Date for PT Re-Evaluation 10/29/14   PT Start Time 1015   PT Stop Time 1105   PT Time Calculation (min) 50 min   Activity Tolerance Patient tolerated treatment well   Behavior During Therapy [facility] for tasks assessed/performed      Past Medical History  Diagnosis Date  . Anterior myocardial infarction     ST-elevation; S/P emergent  drug-eluting stenting of proximal left anterior descending  . Renal insufficiency   . Hypertension   . Alcohol abuse   . Coronary artery disease     Cath October 2012-moderate disease in RCA, Circumflex    Past Surgical History  Procedure Laterality Date  . Doppler echocardiography      Preserved left venticular function  . Leg surgery      left leg - has rod and 4 pins  . Patellar tendon repair Right 04/26/2014    Procedure: RIGHT PATELLA TENDON REPAIR;  Surgeon: Michael Apleyimothy D Murphy, MD;  Location: [facility];  Service: Orthopedics;  Laterality: Right;  . Colonoscopy N/A 08/16/2014    Procedure: COLONOSCOPY;  Surgeon: Michael BaliSandi L Fields, MD;  [facility];  Service: Endoscopy;  Laterality: N/A;  10:15 AM    There were no vitals filed for this visit.  Visit Diagnosis:  Weakness of left leg - Plan: PT plan of care cert/re-cert  Difficulty walking - Plan: PT plan of care cert/re-cert  Stiffness of knee joint, right - Plan: PT plan of care cert/re-cert  Edema - Plan: PT plan of care cert/re-cert      Subjective Assessment - 09/17/14 1018    Subjective he reports that he has continued doing his HEP, and hasn't had  any problem since the last visit. He reported that he saw Dr. Eulah [name] on last Friday 09/13/2014; per pt report physician wants to continue with physical therapy.   Currently in Pain? Yes   Pain Score 1    Pain Location Knee   Pain Orientation Right   Pain Descriptors / Indicators Aching   Pain Type Surgical pain   Pain Onset More than a month ago   Pain Frequency Intermittent   Aggravating Factors  stiff in the morning   Pain Relieving Factors exercise, and rest   Effect of Pain on Daily Activities limited endurance            [facility] PT Assessment - 09/17/14 1022    Observation/Other Assessments   Focus on Therapeutic Outcomes (FOTO)  Limited 70% , predicted limit 52%  improvement of 20% from initial evaluation   AROM   Right Knee Extension 0  0 degrees supine/ -11 degrees with with SLR   Right Knee Flexion 124   Strength   Right Hip Flexion 4+/5   Right Hip Extension 4+/5   Right Knee Flexion 4+/5   Right Knee Extension 2/5   Left Knee Flexion 5/5   Left Knee Extension 5/5   Ambulation/Gait   Stairs Yes   Stair Management Technique Two rails;Alternating pattern   Number of Stairs 6  x 4  Height of Stairs 6                   [facility] Adult PT Treatment/Exercise - 09/17/14 1029    Knee/Hip Exercises: Aerobic   Stationary Bike Bike L4 x  to assist with AROM and strengthening   Knee/Hip Exercises: Machines for Strengthening   Cybex Leg Press 1 plate, 2 plate with ball squeeze x 20 each                PT Education - 09/17/14 1324    Education provided Yes   Education Details Re-evaluaiton POC, HEP review   Person(s) Educated Patient   Methods Explanation   Comprehension Verbalized understanding          PT Short Term Goals - 09/17/14 1043    PT SHORT TERM GOAL #1   Title Independent with inital HEP affter review   Time 1   Period Weeks   Status Achieved   PT SHORT TERM GOAL #2   Title Improve active RT knee extension to -20 degrees    Time 4   Period Weeks   Status Achieved   PT SHORT TERM GOAL #3   Title Improve active Rt knee flexion to 110 degrees   Time 4   Period Weeks   Status Achieved   PT SHORT TERM GOAL #4   Title report pain decr 25% or more   Time 4   Period Weeks   Status Achieved   PT SHORT TERM GOAL #5   Title walk without device in home safely and out of home with [facility] as able   Time 4   Period Weeks   Status On-going           PT Long Term Goals - 09/17/14 1040    PT LONG TERM GOAL #1   Title Independent with HEP as issued by last visit   Time 12   Period Weeks   Status On-going   PT LONG TERM GOAL #2   Title Report pain 1-2/10 max with walking community distances   Time 12   Period Weeks   Status Achieved   PT LONG TERM GOAL #3   Title Walk with no device in home and community distances   Time 12   Period Weeks   Status On-going   PT LONG TERM GOAL #4   Title Walk stairs with one rail step over step with > 1/10 pain to assist with community ambulation and safety   Time 6   Period Weeks   Status On-going   PT LONG TERM GOAL #5   Title Active RT knee range equal LT    Time 6   Period Weeks   Status On-going   PT LONG TERM GOAL #6   Title return to normla home tasks without limits   Time 6   Period Weeks   Status On-going   PT LONG TERM GOAL #7   Title pt will decreas 10 MWT by > 4 sec to assist with safe community ambulation without an AD    Time 6   Period Weeks   Status New               Plan - 09/17/14 1325    Clinical Impression Statement Michael [name] has made progress with R knee strenght and AROM with 0/ 124 with knee flat on the table.  He continues to demonstrate weakness with an extensor lag of -11 during SLR, and an antalgic gait  with and without the use of an AD. He was able to ambulate  up and down 6 inch steps requiring prolong time using an alt pattern using bil railings with supervision for safety. He would benefti from continued physical therapy to  increase his Strenght and endurance to promote safety.    Pt will benefit from skilled therapeutic intervention in order to improve on the following deficits Increased edema;Pain;Decreased mobility;Increased fascial restricitons;Decreased range of motion;Decreased strength;Decreased endurance;Decreased activity tolerance;Decreased balance;Difficulty walking   Rehab Potential Good   PT Frequency 2x / week   PT Duration 6 weeks   PT Treatment/Interventions ADLs/Self Care Home Management;Electrical Stimulation;Cryotherapy;Moist Heat;Gait training;Therapeutic exercise;Patient/family education;Balance training;Manual techniques;Passive range of motion;Therapeutic activities   PT Next Visit Plan Quad strength, VMS/russian, continue reformer, progress to advanced strengthening   PT Home Exercise Plan same as previous   Consulted and Agree with Plan of Care Patient        Problem List Patient Active Problem List   Diagnosis Date Noted  . Frequent urination 09/03/2014  . Lower abdominal pain 09/03/2014  . Special screening for malignant neoplasms, colon   . Cervical stenosis of spine 05/16/2014  . Essential hypertension 05/16/2014  . Numbness and tingling of right arm 11/28/2013  . Dental abscess 04/26/2013  . Dysuria 04/26/2013  . HTN (hypertension) 04/04/2013  . CAD (coronary artery disease) 05/25/2011   Lulu Riding PT, DPT, LAT, ATC  09/17/2014  2:41 PM   [facility] [address] [place], [place], [address] Phone: [phone]   Fax:  [phone]

## 2014-09-20 ENCOUNTER — Telehealth: Payer: Self-pay

## 2014-09-20 NOTE — Telephone Encounter (Signed)
Patient is aware of his lab results and us

## 2014-09-20 NOTE — Telephone Encounter (Signed)
-----   Message from Quentin Angstlugbemiga E Jegede, MD sent at 09/13/2014  6:05 PM EDT ----- Please inform patient that his laboratory tenderness is also mostly within normal limit except for his cholesterol level. Advise patient to continue his Crestor and also to address this please limit saturated fat to no more than 7% of your calories, limit cholesterol to 200 mg/day, increase fiber and exercise as tolerated.

## 2014-09-23 ENCOUNTER — Telehealth: Payer: Self-pay

## 2014-09-23 NOTE — Telephone Encounter (Signed)
-----   Message from Quentin Angstlugbemiga E Jegede, MD sent at 09/13/2014  5:28 PM EDT ----- Please inform patient that his abdominal ultrasound is normal.

## 2014-09-23 NOTE — Telephone Encounter (Signed)
Patient is aware of his ultrasound results

## 2014-09-24 ENCOUNTER — Encounter: Payer: No Typology Code available for payment source | Admitting: Physical Therapy

## 2014-09-26 ENCOUNTER — Ambulatory Visit: Payer: Medicaid Other | Admitting: Physical Therapy

## 2014-09-26 VITALS — BP 132/96

## 2014-09-26 DIAGNOSIS — R609 Edema, unspecified: Secondary | ICD-10-CM

## 2014-09-26 DIAGNOSIS — M25661 Stiffness of right knee, not elsewhere classified: Secondary | ICD-10-CM | POA: Diagnosis not present

## 2014-09-26 DIAGNOSIS — R29898 Other symptoms and signs involving the musculoskeletal system: Secondary | ICD-10-CM

## 2014-09-26 DIAGNOSIS — R262 Difficulty in walking, not elsewhere classified: Secondary | ICD-10-CM

## 2014-09-26 NOTE — Therapy (Signed)
[facility] [address] [place], [place], [address] Phone: [phone]   Fax:  [phone]  Physical Therapy Treatment  Patient Details  Name: Michael [name] MRN: [id number] Date of Birth: 06/17/1960 Referring Provider:  [name], Olugbemiga E, MD  Encounter Date: 09/26/2014      PT End of Session - 09/26/14 0931    Visit Number 21   Number of Visits 36   Date for PT Re-Evaluation 10/29/14   PT Start Time 0923   PT Stop Time 1020   PT Time Calculation (min) 57 min      Past Medical History  Diagnosis Date  . Anterior myocardial infarction     ST-elevation; S/P emergent  drug-eluting stenting of proximal left anterior descending  . Renal insufficiency   . Hypertension   . Alcohol abuse   . Coronary artery disease     Cath October 2012-moderate disease in RCA, Circumflex    Past Surgical History  Procedure Laterality Date  . Doppler echocardiography      Preserved left venticular function  . Leg surgery      left leg - has rod and 4 pins  . Patellar tendon repair Right 04/26/2014    Procedure: RIGHT PATELLA TENDON REPAIR;  Surgeon: Sheral Apleyimothy D Murphy, MD;  Location: [facility];  Service: Orthopedics;  Laterality: Right;  . Colonoscopy N/A 08/16/2014    Procedure: COLONOSCOPY;  Surgeon: West BaliSandi L Fields, MD;  [facility];  Service: Endoscopy;  Laterality: N/A;  10:15 AM    Filed Vitals:   09/26/14 0928  BP: 132/96    Visit Diagnosis:  Weakness of left leg  Difficulty walking  Stiffness of knee joint, right  Edema      Subjective Assessment - 09/26/14 0928    Subjective The pt. reports feeling sick recently, with a headache and lightheadedness.  He asked to have his BP taken.  The BP reading before treatment was 132/96 and 128/92 after treatment.     Currently in Pain? Yes   Pain Score 6    Pain Location Back   Pain Descriptors / Indicators Sore   Pain Onset In the past 7 days    Aggravating Factors  sitting too long and it becomes stiff and is hard to get up   Pain Relieving Factors getting moving             [facility] - 09/26/14 1144    AROM   Right Knee Extension -2  -27 w/ SLR; -25 after treatment                   [facility] Adult PT Treatment/Exercise - 09/26/14 0932    Knee/Hip Exercises: Aerobic   Stationary Bike Bike L4 x 10min  to assist with AROM and strengthening   Knee/Hip Exercises: Seated   Long Arc Quad Strengthening;Right;2 sets;10 reps  pt. was fatigued after russian e-stim   Knee/Hip Exercises: Supine   Short Arc [employer]Quad Sets Strengthening;Right;2 sets;10 reps   Short Arc [employer]Quad Sets Limitations with russian stim for quad activation as well as assist for full extention by PTA   Straight Leg Raises AROM;Right;2 sets;10 reps  1 set from elevated wedge with russian e-stim   Modalities   Modalities Insurance account managerlectrical Stimulation   Electrical Stimulation   Electrical Stimulation Location R quad/VMO   Firefighterlectrical Stimulation Action russian    Electrical Stimulation Parameters 40 intensity; 10/10; 2 second ramp; 50%   Electrical Stimulation Goals  Strength  VMO activation/ eduaction with SAQ/SLR                  PT Short Term Goals - 09/17/14 1043    PT SHORT TERM GOAL #1   Title Independent with inital HEP affter review   Time 1   Period Weeks   Status Achieved   PT SHORT TERM GOAL #2   Title Improve active RT knee extension to -20 degrees   Time 4   Period Weeks   Status Achieved   PT SHORT TERM GOAL #3   Title Improve active Rt knee flexion to 110 degrees   Time 4   Period Weeks   Status Achieved   PT SHORT TERM GOAL #4   Title report pain decr 25% or more   Time 4   Period Weeks   Status Achieved   PT SHORT TERM GOAL #5   Title walk without device in home safely and out of home with [facility] as able   Time 4   Period Weeks   Status On-going           PT Long Term Goals - 09/17/14 1040    PT LONG TERM  GOAL #1   Title Independent with HEP as issued by last visit   Time 12   Period Weeks   Status On-going   PT LONG TERM GOAL #2   Title Report pain 1-2/10 max with walking community distances   Time 12   Period Weeks   Status Achieved   PT LONG TERM GOAL #3   Title Walk with no device in home and community distances   Time 12   Period Weeks   Status On-going   PT LONG TERM GOAL #4   Title Walk stairs with one rail step over step with > 1/10 pain to assist with community ambulation and safety   Time 6   Period Weeks   Status On-going   PT LONG TERM GOAL #5   Title Active RT knee range equal LT    Time 6   Period Weeks   Status On-going   PT LONG TERM GOAL #6   Title return to normla home tasks without limits   Time 6   Period Weeks   Status On-going   PT LONG TERM GOAL #7   Title pt will decreas 10 MWT by > 4 sec to assist with safe community ambulation without an AD    Time 6   Period Weeks   Status New               Plan - 09/26/14 1148    Clinical Impression Statement The pt. continues to demonstrate weakness with an extensor lag of -25 during SLR.  He broke his cane yesterday and was not using an AD today.  He was able to do 20 long arc quads with assistance for terminal knee extension.  He c/o recent sickness and feelings of high BP, so his BP was taken twice - 1362/96 after the bike and 128/92 at the end of treatment.  Pt. was given a cane to take home.     PT Next Visit Plan Quad strength, VMS/russian, progress to advanced strengthening        Problem List Patient Active Problem List   Diagnosis Date Noted  . Frequent urination 09/03/2014  . Lower abdominal pain 09/03/2014  . Special screening for malignant neoplasms, colon   . Cervical stenosis of spine 05/16/2014  .  Essential hypertension 05/16/2014  . Numbness and tingling of right arm 11/28/2013  . Dental abscess 04/26/2013  . Dysuria 04/26/2013  . HTN (hypertension) 04/04/2013  . CAD  (coronary artery disease) 05/25/2011    Sharlett Lienemann,McKinnley, SPTA 09/26/2014, 12:35 PM  [facility] [address] [place], [place], [address] Phone: [phone]   Fax:  [phone]

## 2014-09-30 ENCOUNTER — Ambulatory Visit: Payer: No Typology Code available for payment source | Attending: Internal Medicine

## 2014-09-30 ENCOUNTER — Other Ambulatory Visit: Payer: Self-pay | Admitting: Cardiovascular Disease

## 2014-10-01 ENCOUNTER — Ambulatory Visit: Payer: Medicaid Other | Admitting: Physical Therapy

## 2014-10-01 DIAGNOSIS — R29898 Other symptoms and signs involving the musculoskeletal system: Secondary | ICD-10-CM

## 2014-10-01 DIAGNOSIS — R609 Edema, unspecified: Secondary | ICD-10-CM

## 2014-10-01 DIAGNOSIS — R262 Difficulty in walking, not elsewhere classified: Secondary | ICD-10-CM

## 2014-10-01 DIAGNOSIS — M25661 Stiffness of right knee, not elsewhere classified: Secondary | ICD-10-CM

## 2014-10-01 NOTE — Therapy (Signed)
[facility] [address] [place], [place], [address] Phone: [phone]   Fax:  [phone]  Physical Therapy Treatment  Patient Details  Name: Michael [name] MRN: [id number] Date of Birth: May 20, 1961 Referring Provider:  Quentin Angst, MD  Encounter Date: 10/01/2014      PT End of Session - 10/01/14 1247    Visit Number 22   Number of Visits 36   Date for PT Re-Evaluation 10/29/14   PT Start Time 0845   PT Stop Time 0940   PT Time Calculation (min) 55 min   Activity Tolerance Patient tolerated treatment well   Behavior During Therapy [facility] for tasks assessed/performed      Past Medical History  Diagnosis Date  . Anterior myocardial infarction     ST-elevation; S/P emergent  drug-eluting stenting of proximal left anterior descending  . Renal insufficiency   . Hypertension   . Alcohol abuse   . Coronary artery disease     Cath October 2012-moderate disease in RCA, Circumflex    Past Surgical History  Procedure Laterality Date  . Doppler echocardiography      Preserved left venticular function  . Leg surgery      left leg - has rod and 4 pins  . Patellar tendon repair Right 04/26/2014    Procedure: RIGHT PATELLA TENDON REPAIR;  Surgeon: Sheral Apley, MD;  Location: [facility];  Service: Orthopedics;  Laterality: Right;  . Colonoscopy N/A 08/16/2014    Procedure: COLONOSCOPY;  Surgeon: West Bali, MD;  [facility];  Service: Endoscopy;  Laterality: N/A;  10:15 AM    There were no vitals filed for this visit.  Visit Diagnosis:  Weakness of left leg  Difficulty walking  Stiffness of knee joint, right  Edema      Subjective Assessment - 10/01/14 0852    Subjective He reports that he feels that he is walking better since the last visit stating that he can feel some pain but its not bad.    Currently in Pain? Yes   Pain Score 4    Pain Location Back   Pain Orientation  Right   Pain Descriptors / Indicators Sore   Pain Type Surgical pain   Pain Onset 1 to 4 weeks ago   Pain Frequency Intermittent   Multiple Pain Sites Yes   Pain Score 2   Pain Location Knee   Pain Orientation Right   Pain Descriptors / Indicators Aching            [facility] PT Assessment - 10/01/14 0001    AROM   Right Knee Extension -22  before treatment                     [facility] Adult PT Treatment/Exercise - 10/01/14 0001    Knee/Hip Exercises: Stretches   Hip Flexor Stretch 2 reps;30 seconds   Knee/Hip Exercises: Aerobic   Stationary Bike Nu Step L 10 x 6 min  goal was 300 steps: had 230 steps   Knee/Hip Exercises: Machines for Strengthening   Cybex Knee Extension 1 x 10 15#  extend with both, eccentric with RLE   Cybex Leg Press 1 plate, 2 plate with ball squeeze x 20 each   Knee/Hip Exercises: Standing   Terminal Knee Extension Strengthening;Right;2 sets;10 reps  with step up on to 2 inch step   Theraband Level (Terminal Knee Extension) Level 3 (Green)   Other Standing Knee Exercises  Sit to stand x 15  with quad/glute set when standing   Knee/Hip Exercises: Supine   Straight Leg Raises AROM;Right;10 reps;2 sets   Modalities   Modalities Insurance account manager Location R quad/VMO   Electrical Stimulation Action VMO   Electrical Stimulation Goals Strength  SAQ to assist with strength and quadricep control                PT Education - 10/01/14 1246    Education provided Yes   Education Details updated POC to include gait training with [facility]   Person(s) Educated Patient   Methods Explanation   Comprehension Verbalized understanding          PT Short Term Goals - 09/17/14 1043    PT SHORT TERM GOAL #1   Title Independent with inital HEP affter review   Time 1   Period Weeks   Status Achieved   PT SHORT TERM GOAL #2   Title Improve active RT knee extension to -20 degrees   Time 4    Period Weeks   Status Achieved   PT SHORT TERM GOAL #3   Title Improve active Rt knee flexion to 110 degrees   Time 4   Period Weeks   Status Achieved   PT SHORT TERM GOAL #4   Title report pain decr 25% or more   Time 4   Period Weeks   Status Achieved   PT SHORT TERM GOAL #5   Title walk without device in home safely and out of home with [facility] as able   Time 4   Period Weeks   Status On-going           PT Long Term Goals - 10/01/14 1255    PT LONG TERM GOAL #1   Title Independent with HEP as issued by last visit   Time 12   Period Weeks   Status On-going   PT LONG TERM GOAL #2   Title Report pain 1-2/10 max with walking community distances   Time 12   Period Weeks   Status Achieved   PT LONG TERM GOAL #3   Title Walk with no device in home and community distances with [facility] for safety    Time 12   Period Weeks   Status Revised   PT LONG TERM GOAL #4   Title Walk stairs with one rail step over step with > 1/10 pain to assist with community ambulation and safety   Time 6   Period Weeks   Status On-going   PT LONG TERM GOAL #5   Title Active RT knee range equal LT    Time 12   Period Weeks   Status On-going   Additional Long Term Goals   Additional Long Term Goals Yes   PT LONG TERM GOAL #6   Title return to normla home tasks without limits   Time 6   Period Weeks   Status On-going   PT LONG TERM GOAL #7   Title pt will decrease 10 MWT by > .95 m/sec to assist with safe community ambulation with [facility]   Time 6   Period Weeks   Status Revised               Plan - 10/01/14 1247    Clinical Impression Statement Michael [name] presented to therapy today reported that he felt better since the last visit. He has made some slight progress with SLR strength demonstrating -22  extensor lag. He brought in a new referral for gait training with [facility] and will update long term goals.  performed 10 MWt  with gait velocity of .83 M/Sec, plan to get 2 minute walk time next visit.     PT Next Visit Plan Quad strength, VMS/russian, progress to advanced strengthening, gait training with [facility], 2 min walk test time   Consulted and Agree with Plan of Care Patient        Problem List Patient Active Problem List   Diagnosis Date Noted  . Frequent urination 09/03/2014  . Lower abdominal pain 09/03/2014  . Special screening for malignant neoplasms, colon   . Cervical stenosis of spine 05/16/2014  . Essential hypertension 05/16/2014  . Numbness and tingling of right arm 11/28/2013  . Dental abscess 04/26/2013  . Dysuria 04/26/2013  . HTN (hypertension) 04/04/2013  . CAD (coronary artery disease) 05/25/2011   Lulu RidingKristoffer Timtohy Broski PT, DPT, LAT, ATC  10/01/2014  1:04 PM   [facility] [address] [place], [place], [address] Phone: [phone]   Fax:  [phone]

## 2014-10-03 ENCOUNTER — Ambulatory Visit: Payer: Medicaid Other | Admitting: Physical Therapy

## 2014-10-03 DIAGNOSIS — R262 Difficulty in walking, not elsewhere classified: Secondary | ICD-10-CM

## 2014-10-03 DIAGNOSIS — R609 Edema, unspecified: Secondary | ICD-10-CM

## 2014-10-03 DIAGNOSIS — M25661 Stiffness of right knee, not elsewhere classified: Secondary | ICD-10-CM | POA: Diagnosis not present

## 2014-10-03 DIAGNOSIS — R29898 Other symptoms and signs involving the musculoskeletal system: Secondary | ICD-10-CM

## 2014-10-03 NOTE — Therapy (Signed)
[facility] [address] [place], [place], [address] Phone: [phone]   Fax:  [phone]  Physical Therapy Treatment  Patient Details  Name: Michael [name] MRN: [id number] Date of Birth: 05-15-1961 Referring Provider:  Quentin Angst, MD  Encounter Date: 10/03/2014      PT End of Session - 10/03/14 0843    Visit Number 23   Number of Visits 36   Date for PT Re-Evaluation 10/29/14   PT Start Time 0840   PT Stop Time 0925   PT Time Calculation (min) 45 min      Past Medical History  Diagnosis Date  . Anterior myocardial infarction     ST-elevation; S/P emergent  drug-eluting stenting of proximal left anterior descending  . Renal insufficiency   . Hypertension   . Alcohol abuse   . Coronary artery disease     Cath October 2012-moderate disease in RCA, Circumflex    Past Surgical History  Procedure Laterality Date  . Doppler echocardiography      Preserved left venticular function  . Leg surgery      left leg - has rod and 4 pins  . Patellar tendon repair Right 04/26/2014    Procedure: RIGHT PATELLA TENDON REPAIR;  Surgeon: Sheral Apley, MD;  Location: [facility];  Service: Orthopedics;  Laterality: Right;  . Colonoscopy N/A 08/16/2014    Procedure: COLONOSCOPY;  Surgeon: West Bali, MD;  [facility];  Service: Endoscopy;  Laterality: N/A;  10:15 AM    There were no vitals filed for this visit.  Visit Diagnosis:  Weakness of left leg  Difficulty walking  Stiffness of knee joint, right  Edema      Subjective Assessment - 10/03/14 0844    Subjective I felt good after last treatment.  I got up too quickly after treatment, though, and I felt a little dizzy.  I'm feeling good today!     Currently in Pain? Yes   Pain Score 2             [facility] PT Assessment - 10/03/14 0001    AROM   Right Knee Extension -26  with SLR after treatment; -3 with leg on table                      [facility] Adult PT Treatment/Exercise - 10/03/14 0001    Knee/Hip Exercises: Aerobic   Stationary Bike Nu step level 10 x 3 minutes; level 6 x 3 mins  320 steps   Knee/Hip Exercises: Machines for Strengthening   Cybex Knee Extension 1 x 15 15#  extend with both, eccentric with RLE   Cybex Leg Press 1 plate, 2 plate with ball squeeze x 10 each; 3 plates with ball squeeze x 15   cues for foot alignment    Knee/Hip Exercises: Standing   Other Standing Knee Exercises Sit to stand x 15  with quad/glute set when standing   Knee/Hip Exercises: Supine   Short Arc Quad Sets Strengthening;Right;2 sets;10 reps   Short Arc [employer] Limitations with russian stim for quad activation as well as assist for full extension by STPA  cues to hold   Straight Leg Raises AROM;Right;10 reps;2 sets   Straight Leg Raises Limitations with russian stim for quad activation    Modalities   Modalities Insurance account manager Location R quad/VMO   Statistician Action russian/VMO  Electrical Stimulation Parameters 48 intensity; 10/10; 2 second ramp; 50%   Electrical Stimulation Goals Strength                  PT Short Term Goals - 09/17/14 1043    PT SHORT TERM GOAL #1   Title Independent with inital HEP affter review   Time 1   Period Weeks   Status Achieved   PT SHORT TERM GOAL #2   Title Improve active RT knee extension to -20 degrees   Time 4   Period Weeks   Status Achieved   PT SHORT TERM GOAL #3   Title Improve active Rt knee flexion to 110 degrees   Time 4   Period Weeks   Status Achieved   PT SHORT TERM GOAL #4   Title report pain decr 25% or more   Time 4   Period Weeks   Status Achieved   PT SHORT TERM GOAL #5   Title walk without device in home safely and [facility]   Time 4   Period Weeks   Status On-going           PT Long Term Goals - 10/01/14 1255    PT  LONG TERM GOAL #1   Title Independent with HEP as issued by last visit   Time 12   Period Weeks   Status On-going   PT LONG TERM GOAL #2   Title Report pain 1-2/10 max with walking community distances   Time 12   Period Weeks   Status Achieved   PT LONG TERM GOAL #3   Title Walk with no device in home and community distances with [facility]    Time 12   Period Weeks   Status Revised   PT LONG TERM GOAL #4   Title Walk stairs with one rail step over step with > 1/10 pain to assist with community ambulation and safety   Time 6   Period Weeks   Status On-going   PT LONG TERM GOAL #5   Title Active RT knee range equal LT    Time 12   Period Weeks   Status On-going   Additional Long Term Goals   Additional Long Term Goals Yes   PT LONG TERM GOAL #6   Title return to normla home tasks without limits   Time 6   Period Weeks   Status On-going   PT LONG TERM GOAL #7   Title pt will decrease 10 MWT by > .95 m/sec to assist with safe community ambulation [facility]   Time 6   Period Weeks   Status Revised               Plan - 10/03/14 0931    Clinical Impression Statement The pt. reports feeling very well.  He was able to continue progressing with the leg press and knee machine.  He still presents with a -26 extensor lag (this was taken after treatment).  2 minute walk time still needs to be measured.     PT Next Visit Plan Quad strength, VMS/russian, progress to advanced strengthening, gait training with [facility], 2 min walk test time        Problem List Patient Active Problem List   Diagnosis Date Noted  . Frequent urination 09/03/2014  . Lower abdominal pain 09/03/2014  . Special screening for malignant neoplasms, colon   . Cervical stenosis of spine 05/16/2014  . Essential hypertension  05/16/2014  . Numbness and tingling of right arm 11/28/2013  . Dental abscess 04/26/2013  . Dysuria 04/26/2013  . HTN (hypertension) 04/04/2013  . CAD (coronary artery disease)  05/25/2011    Aala Ransom,McKinnley, SPTA 10/03/2014, 9:34 AM  [facility] [address] [place], [place], [address] Phone: [phone]   Fax:  [phone]

## 2014-10-04 ENCOUNTER — Other Ambulatory Visit: Payer: Self-pay

## 2014-10-04 MED ORDER — LISINOPRIL 20 MG PO TABS: ORAL_TABLET | ORAL | Status: DC

## 2014-10-10 ENCOUNTER — Ambulatory Visit: Payer: Medicaid Other | Admitting: Physical Therapy

## 2014-10-10 DIAGNOSIS — R262 Difficulty in walking, not elsewhere classified: Secondary | ICD-10-CM

## 2014-10-10 DIAGNOSIS — R29898 Other symptoms and signs involving the musculoskeletal system: Secondary | ICD-10-CM

## 2014-10-10 DIAGNOSIS — M25661 Stiffness of right knee, not elsewhere classified: Secondary | ICD-10-CM

## 2014-10-10 DIAGNOSIS — R609 Edema, unspecified: Secondary | ICD-10-CM

## 2014-10-10 NOTE — Patient Instructions (Signed)
KNEE: Extension, Long Arc Quads - Sitting   Raise leg until knee is straight. _10__ reps per set, hold for 5 seconds,  _2__ sets per day, __7_ days per week  Copyright  VHI. All rights reserved.

## 2014-10-10 NOTE — Therapy (Signed)
[facility] [address] [place], [place], [address] Phone: [phone]   Fax:  [phone]  Physical Therapy Treatment  Patient Details  Name: Michael [name] MRN: [id number] Date of Birth: 1961/04/05 Referring Provider:  Quentin Angst, MD  Encounter Date: 10/10/2014      PT End of Session - 10/10/14 0756    Visit Number 24   Number of Visits 36   Date for PT Re-Evaluation 10/29/14   PT Start Time 0755   PT Stop Time 0900   PT Time Calculation (min) 65 min      Past Medical History  Diagnosis Date  . Anterior myocardial infarction     ST-elevation; S/P emergent  drug-eluting stenting of proximal left anterior descending  . Renal insufficiency   . Hypertension   . Alcohol abuse   . Coronary artery disease     Cath October 2012-moderate disease in RCA, Circumflex    Past Surgical History  Procedure Laterality Date  . Doppler echocardiography      Preserved left venticular function  . Leg surgery      left leg - has rod and 4 pins  . Patellar tendon repair Right 04/26/2014    Procedure: RIGHT PATELLA TENDON REPAIR;  Surgeon: Sheral Apley, MD;  Location: [facility];  Service: Orthopedics;  Laterality: Right;  . Colonoscopy N/A 08/16/2014    Procedure: COLONOSCOPY;  Surgeon: West Bali, MD;  [facility];  Service: Endoscopy;  Laterality: N/A;  10:15 AM    There were no vitals filed for this visit.  Visit Diagnosis:  Weakness of left leg  Difficulty walking  Stiffness of knee joint, right  Edema      Subjective Assessment - 10/10/14 0756    Subjective I was on my feet all day yesterday and my knee buckled last night.  I caught myself on the couch before I fell.  I'm also having some back pain but I don't know what is causing that.     Currently in Pain? Yes   Pain Score 2    Pain Location Knee   Pain Orientation Right   Aggravating Factors  walking for long periods  of time    Pain Relieving Factors rest and sit down            [facility] PT Assessment - 10/10/14 0831    AROM   Right Knee Extension -17  w/ SLR; -3 with leg on table   Right Knee Flexion 126                     [facility] Adult PT Treatment/Exercise - 10/10/14 0755    Ambulation/Gait   Ambulation/Gait Yes   Ambulation/Gait Assistance 6: Modified independent (Device/Increase time)   Ambulation/Gait Assistance Details 248 ft in 2 mins   Ambulation Distance (Feet) 248 Feet   Assistive device Straight cane   Gait Pattern Step-through pattern;Decreased step length - right;Antalgic   Ambulation Surface Level   Gait velocity 2.07 ft/s   Knee/Hip Exercises: Aerobic   Stationary Bike Nu step level 7 x 6 minutes  320 steps   Knee/Hip Exercises: Machines for Strengthening   Cybex Knee Extension 1 plate x 10 bil; 1 plate x 10 bil up left down   Cybex Knee Flexion 2 plates bil down left up x 15; 3 plates bil x 15   Cybex Leg Press 2 plate x 15; 3 plate x 10   Knee/Hip  Exercises: Standing   Terminal Knee Extension Strengthening;Right;2 sets;10 reps  with step up on to 2 inch step   Theraband Level (Terminal Knee Extension) Level 3 (Green)   Lateral Step Up Right;10 reps;Hand Hold: 1;Step Height: 2"  cues to bear weight on right   Forward Step Up Right;20 reps;Hand Hold: 1;Step Height: 2"  cues to bend knee   Knee/Hip Exercises: Seated   Long Arc Quad 10 reps;Right  5 sec hold   Knee/Hip Exercises: Supine   Short Arc Quad Sets Strengthening;Right;2 sets;10 reps   Short Arc [employer]ad Sets Limitations with russian stim for quad activation as well as assist by SPTA for full extension   Straight Leg Raises AROM;Right;10 reps;2 sets   Straight Leg Raises Limitations with russian stim for quad activation   Programme researcher, broadcasting/film/videolectrical Stimulation   Electrical Stimulation Location R quad/VMO   Electrical Stimulation Action Russian/VMO   Electrical Stimulation Parameters 15 intensity; 10/10; 2 second  ramp; 50%   Electrical Stimulation Goals Strength  SAQ and SLR to assist with strength and quad control                PT Education - 10/10/14 0902    Education provided Yes   Education Details LAQ HEP   Person(s) Educated Patient   Methods Explanation;Handout   Comprehension Verbalized understanding;Returned demonstration          PT Short Term Goals - 09/17/14 1043    PT SHORT TERM GOAL #1   Title Independent with inital HEP affter review   Time 1   Period Weeks   Status Achieved   PT SHORT TERM GOAL #2   Title Improve active RT knee extension to -20 degrees   Time 4   Period Weeks   Status Achieved   PT SHORT TERM GOAL #3   Title Improve active Rt knee flexion to 110 degrees   Time 4   Period Weeks   Status Achieved   PT SHORT TERM GOAL #4   Title report pain decr 25% or more   Time 4   Period Weeks   Status Achieved   PT SHORT TERM GOAL #5   Title walk without device in home safely and out of [facility] with [facility] as able   Time 4   Period Weeks   Status On-going           PT Long Term Goals - 10/01/14 1255    PT LONG TERM GOAL #1   Title Independent with HEP as issued by last visit   Time 12   Period Weeks   Status On-going   PT LONG TERM GOAL #2   Title Report pain 1-2/10 max with walking community distances   Time 12   Period Weeks   Status Achieved   PT LONG TERM GOAL #3   Title Walk with no device in home and community distances with [facility] for safety    Time 12   Period Weeks   Status Revised   PT LONG TERM GOAL #4   Title Walk stairs with one rail step over step with > 1/10 pain to assist with community ambulation and safety   Time 6   Period Weeks   Status On-going   PT LONG TERM GOAL #5   Title Active RT knee range equal LT    Time 12   Period Weeks   Status On-going   Additional Long Term Goals   Additional Long Term Goals Yes   PT LONG TERM  GOAL #6   Title return to normla home tasks without limits   Time 6   Period Weeks    Status On-going   PT LONG TERM GOAL #7   Title pt will decrease 10 MWT by > .95 m/sec to assist with safe community ambulation with [facility]   Time 6   Period Weeks   Status Revised               Plan - 10/10/14 1610    Clinical Impression Statement The pt. performed well today.  His RT knee extension AROM has improved to -17 with a SLR.  He measured 2.07 ft/s in a 2 minute walk test.  He was also able to progress to more weight/resistance on the leg press and knee machine with slight fatique.  He reports still having to use his SPC at home and outside of the home.     PT Next Visit Plan Quad strength, VMS/russian, progress to advanced strengthening, gait training with SPC, recheck RT knee strength        Problem List Patient Active Problem List   Diagnosis Date Noted  . Frequent urination 09/03/2014  . Lower abdominal pain 09/03/2014  . Special screening for malignant neoplasms, colon   . Cervical stenosis of spine 05/16/2014  . Essential hypertension 05/16/2014  . Numbness and tingling of right arm 11/28/2013  . Dental abscess 04/26/2013  . Dysuria 04/26/2013  . HTN (hypertension) 04/04/2013  . CAD (coronary artery disease) 05/25/2011    Bilaal Leib,McKinnley, SPTA 10/10/2014, 9:10 AM  [facility] [address] [place], [place], [address] Phone: [phone]   Fax:  [phone]

## 2014-10-11 ENCOUNTER — Telehealth: Payer: Self-pay | Admitting: *Deleted

## 2014-10-11 NOTE — Telephone Encounter (Signed)
Spoke with pt. He reports since starting Crestor he has had a daily headache. He did not take Crestor today and has no headache.  I instructed pt to continue to hold Crestor and call me next Thursday with an update.  If headache improves off Crestor will review with Dr. [name] about possible alternative statin.

## 2014-10-11 NOTE — Telephone Encounter (Signed)
Left message to call back  

## 2014-10-11 NOTE — Telephone Encounter (Signed)
Patient called and stated that since starting the crestor he has been having headaches. He would like to know if this is a side effect or if the medication is too strong for him. Please call back and discuss. Thanks, MI

## 2014-10-14 ENCOUNTER — Encounter: Payer: Medicaid Other | Attending: Physical Medicine & Rehabilitation

## 2014-10-14 ENCOUNTER — Encounter: Payer: Self-pay | Admitting: Physical Medicine & Rehabilitation

## 2014-10-14 ENCOUNTER — Ambulatory Visit (HOSPITAL_BASED_OUTPATIENT_CLINIC_OR_DEPARTMENT_OTHER): Payer: No Typology Code available for payment source | Admitting: Physical Medicine & Rehabilitation

## 2014-10-14 VITALS — BP 121/82 | HR 54 | Resp 14

## 2014-10-14 DIAGNOSIS — R200 Anesthesia of skin: Secondary | ICD-10-CM | POA: Insufficient documentation

## 2014-10-14 DIAGNOSIS — R202 Paresthesia of skin: Secondary | ICD-10-CM | POA: Insufficient documentation

## 2014-10-14 DIAGNOSIS — M5020 Other cervical disc displacement, unspecified cervical region: Secondary | ICD-10-CM

## 2014-10-14 DIAGNOSIS — M545 Low back pain, unspecified: Secondary | ICD-10-CM

## 2014-10-14 DIAGNOSIS — G8929 Other chronic pain: Secondary | ICD-10-CM | POA: Insufficient documentation

## 2014-10-14 HISTORY — DX: Other cervical disc displacement, unspecified cervical region: M50.20
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
## 2014-10-15 ENCOUNTER — Ambulatory Visit: Payer: Medicaid Other | Attending: Internal Medicine | Admitting: Physical Therapy

## 2014-10-15 DIAGNOSIS — R609 Edema, unspecified: Secondary | ICD-10-CM | POA: Diagnosis not present

## 2014-10-15 DIAGNOSIS — R29898 Other symptoms and signs involving the musculoskeletal system: Secondary | ICD-10-CM | POA: Insufficient documentation

## 2014-10-15 DIAGNOSIS — R262 Difficulty in walking, not elsewhere classified: Secondary | ICD-10-CM

## 2014-10-15 DIAGNOSIS — M25661 Stiffness of right knee, not elsewhere classified: Secondary | ICD-10-CM | POA: Diagnosis present

## 2014-10-15 NOTE — Therapy (Signed)
[place], [place], [address] Phone: [phone]   Fax:  [phone]  Physical Therapy Treatment  Patient Details  Name: Michael [name] MRN: [id number] Date of Birth: 07-20-1960 Referring Provider:  Tresa Garter, MD  Encounter Date: 10/15/2014      PT End of Session - 10/15/14 0958    Visit Number 25   Number of Visits 36   Date for PT Re-Evaluation 10/29/14   PT Start Time 0845   PT Stop Time 0940   PT Time Calculation (min) 55 min   Activity Tolerance Patient tolerated treatment well   Behavior During Therapy [facility] for tasks assessed/performed      Past Medical History  Diagnosis Date  . Anterior myocardial infarction     ST-elevation; S/P emergent  drug-eluting stenting of proximal left anterior descending  . Renal insufficiency   . Hypertension   . Alcohol abuse   . Coronary artery disease     Cath October 2012-moderate disease in RCA, Circumflex    Past Surgical History  Procedure Laterality Date  . Doppler echocardiography      Preserved left venticular function  . Leg surgery      left leg - has rod and 4 pins  . Patellar tendon repair Right 04/26/2014    Procedure: RIGHT PATELLA TENDON REPAIR;  Surgeon: Renette Butters, MD;  Location: [place];  Service: Orthopedics;  Laterality: Right;  . Colonoscopy N/A 08/16/2014    Procedure: COLONOSCOPY;  Surgeon: Danie Binder, MD;  [facility];  Service: Endoscopy;  Laterality: N/A;  10:15 AM    There were no vitals filed for this visit.  Visit Diagnosis:  Weakness of left leg  Difficulty walking  Stiffness of knee joint, right  Edema      Subjective Assessment - 10/15/14 0848    Subjective pt reports that he feels that he has been doing better and has been getting stronger in his legs. "I went to the back doctor and got some imaging for my back and need to get nerve testing for my carpal tunnel  due to increased pain.    Currently in Pain? Yes   Pain Score 2    Pain Location Knee   Pain Orientation Right   Pain Descriptors / Indicators Sore   Pain Type Surgical pain   Pain Onset More than a month ago   Pain Frequency Intermittent   Pain Score 5   Pain Location Back   Pain Orientation Mid;Lower   Pain Descriptors / Indicators Aching            [facility] PT Assessment - 10/15/14 0001    AROM   Right Knee Extension -17  extensor lag with SLR   Strength   Right Hip Flexion 4+/5   Right Hip Extension 4+/5   Right Knee Flexion 4+/5   Right Knee Extension 3/5   Left Knee Flexion 5/5   Left Knee Extension 5/5                     [facility] Adult PT Treatment/Exercise - 10/15/14 0851    Knee/Hip Exercises: Stretches   Passive Hamstring Stretch 2 reps;30 seconds  with strap with VC to keep knee straight   Quad Stretch 2 reps;30 seconds   Knee/Hip Exercises: Aerobic   Stationary Bike Nu step level 8 x 6 minutes  328   Knee/Hip Exercises: Machines for Strengthening   Cybex  Knee Extension 1 plate x 10 bil x 2 sets   Cybex Knee Flexion 2 plates bil down left up x 15; 3 plates bil x 15  noted some pain during single limb eccentric lowering.    Cybex Leg Press 2 plate x 15; 3 plate x 10   Knee/Hip Exercises: Standing   Terminal Knee Extension Strengthening;Right;2 sets;10 reps  with step up on 2 in step   Theraband Level (Terminal Knee Extension) Level 3 (Green)   Other Standing Knee Exercises Sit to stand x 15   Knee/Hip Exercises: Supine   Short Arc Quad Sets Strengthening;Right;2 sets;10 reps   Straight Leg Raises AROM;Right;10 reps;2 sets   Knee/Hip Exercises: Prone   Other Prone Exercises knee flexion with hip ext 2 x 10   3#   Electrical Stimulation   Electrical Stimulation Location R quad/VMO   Electrical Stimulation Action Russian    Electrical Stimulation Parameters 10 min5/5, 50% intensity to tolerance    Electrical Stimulation Goals Strength  VMO                 PT Education - 10/15/14 0957    Education provided Yes   Education Details progress HEP by holding exercises longer, and doing more reps to progress self strengthening.    Person(s) Educated Patient   Methods Explanation   Comprehension Verbalized understanding          PT Short Term Goals - 09/17/14 1043    PT SHORT TERM GOAL #1   Title Independent with inital HEP affter review   Time 1   Period Weeks   Status Achieved   PT SHORT TERM GOAL #2   Title Improve active RT knee extension to -20 degrees   Time 4   Period Weeks   Status Achieved   PT SHORT TERM GOAL #3   Title Improve active Rt knee flexion to 110 degrees   Time 4   Period Weeks   Status Achieved   PT SHORT TERM GOAL #4   Title report pain decr 25% or more   Time 4   Period Weeks   Status Achieved   PT SHORT TERM GOAL #5   Title walk without device in home safely and out of home with [facility] as able   Time 4   Period Weeks   Status On-going           PT Long Term Goals - 10/01/14 1255    PT LONG TERM GOAL #1   Title Independent with HEP as issued by last visit   Time 12   Period Weeks   Status On-going   PT LONG TERM GOAL #2   Title Report pain 1-2/10 max with walking community distances   Time 12   Period Weeks   Status Achieved   PT LONG TERM GOAL #3   Title Walk with no device in home and community distances with [facility] for safety    Time 12   Period Weeks   Status Revised   PT LONG TERM GOAL #4   Title Walk stairs with one rail step over step with > 1/10 pain to assist with community ambulation and safety   Time 6   Period Weeks   Status On-going   PT LONG TERM GOAL #5   Title Active RT knee range equal LT    Time 12   Period Weeks   Status On-going   Additional Long Term Goals   Additional Long Term Goals Yes  PT LONG TERM GOAL #6   Title return to normla home tasks without limits   Time 6   Period Weeks   Status On-going   PT LONG TERM GOAL #7   Title  pt will decrease 10 MWT by > .95 m/sec to assist with safe community ambulation with [facility]   Time 6   Period Weeks   Status Revised               Plan - 10/15/14 0958    Clinical Impression Statement Michael [name] continues to make progress with strenghtening and has improved r knee extension strength to 3/5. but continues to exhibit an extensor lag of -17 degrees during SLR. No new goals met this visit. He tolerated exercises well today and was able to tolerated step ups with theraband tke's with 2 in step and 1 set with 4 inch step. Plan to continue progress with strengthening as tolerated.    PT Next Visit Plan Quad strength, VMS/russian, progress to advanced strengthening, gait training with [facility]   PT Home Exercise Plan same as previous   Consulted and Agree with Plan of Care Patient        Problem List Patient Active Problem List   Diagnosis Date Noted  . HNP (herniated nucleus pulposus), cervical 10/14/2014  . Chronic low back pain 10/14/2014  . Numbness and tingling of both upper extremities while sleeping 10/14/2014  . Frequent urination 09/03/2014  . Lower abdominal pain 09/03/2014  . Special screening for malignant neoplasms, colon   . Cervical stenosis of spine 05/16/2014  . Essential hypertension 05/16/2014  . Numbness and tingling of right arm 11/28/2013  . Dental abscess 04/26/2013  . Dysuria 04/26/2013  . HTN (hypertension) 04/04/2013  . CAD (coronary artery disease) 05/25/2011   Starr Lake PT, DPT, LAT, ATC  10/15/2014  10:08 AM    [facility] [address] [place], [place], [address] Phone: [phone]   Fax:  [phone]

## 2014-10-17 ENCOUNTER — Ambulatory Visit: Payer: Medicaid Other | Admitting: Physical Therapy

## 2014-10-17 DIAGNOSIS — R262 Difficulty in walking, not elsewhere classified: Secondary | ICD-10-CM

## 2014-10-17 DIAGNOSIS — R609 Edema, unspecified: Secondary | ICD-10-CM

## 2014-10-17 DIAGNOSIS — R29898 Other symptoms and signs involving the musculoskeletal system: Secondary | ICD-10-CM

## 2014-10-17 DIAGNOSIS — M25661 Stiffness of right knee, not elsewhere classified: Secondary | ICD-10-CM

## 2014-10-17 NOTE — Therapy (Signed)
[place], [place], [address] Phone: [phone]   Fax:  [phone]  Physical Therapy Treatment  Patient Details  Name: Michael [name] MRN: [id number] Date of Birth: 1961/01/03 Referring Provider:  Tresa Garter, MD  Encounter Date: 10/17/2014      PT End of Session - 10/17/14 0903    Visit Number 26   Number of Visits 36   Date for PT Re-Evaluation 10/29/14   PT Start Time 0845   PT Stop Time 0945   PT Time Calculation (min) 60 min   Equipment Utilized During Treatment Gait belt   Activity Tolerance Patient tolerated treatment well   Behavior During Therapy [facility] for tasks assessed/performed      Past Medical History  Diagnosis Date  . Anterior myocardial infarction     ST-elevation; S/P emergent  drug-eluting stenting of proximal left anterior descending  . Renal insufficiency   . Hypertension   . Alcohol abuse   . Coronary artery disease     Cath October 2012-moderate disease in RCA, Circumflex    Past Surgical History  Procedure Laterality Date  . Doppler echocardiography      Preserved left venticular function  . Leg surgery      left leg - has rod and 4 pins  . Patellar tendon repair Right 04/26/2014    Procedure: RIGHT PATELLA TENDON REPAIR;  Surgeon: Renette Butters, MD;  Location: [place];  Service: Orthopedics;  Laterality: Right;  . Colonoscopy N/A 08/16/2014    Procedure: COLONOSCOPY;  Surgeon: Danie Binder, MD;  [facility];  Service: Endoscopy;  Laterality: N/A;  10:15 AM    There were no vitals filed for this visit.  Visit Diagnosis:  Difficulty walking  Weakness of left leg  Stiffness of knee joint, right  Edema      Subjective Assessment - 10/17/14 0848    Subjective He reports that he is feeling a little more sore today since the last visit. "I believe it is from the raining, I just didn't want to get out of bed because of  those joints cracking."   Currently in Pain? Yes   Pain Score 5    Pain Location Knee   Pain Orientation Right   Pain Descriptors / Indicators Aching;Sore   Pain Type Surgical pain   Pain Onset More than a month ago   Pain Frequency Intermittent   Pain Score 6   Pain Location Back   Pain Orientation Mid;Lower   Pain Descriptors / Indicators Aching            [facility] PT Assessment - 10/17/14 0001    AROM   Right Knee Extension -18  extensor lag                     [facility] Adult PT Treatment/Exercise - 10/17/14 0850    Ambulation/Gait   Stair Management Technique One rail Right;With cane  30 steps, up/dwon with L, up and/ down with R   Gait Comments walking stepping over cones 6 x  2 x both legs in each one, 2x each, 2x side stepping    Knee/Hip Exercises: Stretches   Passive Hamstring Stretch 2 reps;30 seconds   Quad Stretch 2 reps;30 seconds   Knee/Hip Exercises: Aerobic   Stationary Bike Nu step level 6 x 6 minutes   Knee/Hip Exercises: Machines for Strengthening   Cybex Leg Press 2 plate x 15; 3 plate  x 10   Knee/Hip Exercises: Standing   Terminal Knee Extension Strengthening;Right;2 sets;10 reps  on 4 in step doing step up   Theraband Level (Terminal Knee Extension) Level 4 (Blue)   Lateral Step Up Right;10 reps;Hand Hold: 1;Step Height: 2"   Forward Step Up Right;20 reps;Hand Hold: 1;Step Height: 2"   Knee/Hip Exercises: Supine   Quad Sets AROM;Strengthening;Right;2 sets;15 reps  2 sec hold   Quad Sets Limitations towel under heel for elevation, and under knee for tactile cueing to pushin down.   Short Scientist, water quality sets;10 reps   Straight Leg Raises AROM;Right;10 reps;2 Engineer, building services Location R quad/VMO   Scientist, research (life sciences) Parameters 10 min, 50%, 5/5, increased to tolerance   Printmaker Goals Strength                PT  Education - 10/17/14 1115    Education provided Yes   Education Details Gait and ambulation and navigating stairs with proper form and safety.    Person(s) Educated Patient   Methods Explanation   Comprehension Verbalized understanding          PT Short Term Goals - 10/17/14 1121    PT SHORT TERM GOAL #1   Title Independent with inital HEP affter review   Time 1   Period Weeks   Status Achieved   PT SHORT TERM GOAL #2   Title Improve active RT knee extension to -20 degrees   Time 4   Period Weeks   Status Achieved   PT SHORT TERM GOAL #3   Title Improve active Rt knee flexion to 110 degrees   Time 4   Period Weeks   Status Achieved   PT SHORT TERM GOAL #4   Title report pain decr 25% or more   Time 4   Period Weeks   Status Achieved   PT SHORT TERM GOAL #5   Title walk without device in home safely and out of home with [facility] as able   Time 4   Period Weeks   Status On-going           PT Long Term Goals - 10/17/14 1230    PT LONG TERM GOAL #1   Title Independent with HEP as issued by last visit   Time 12   Period Weeks   Status On-going   PT LONG TERM GOAL #2   Title Report pain 1-2/10 max with walking community distances   Time 12   Period Weeks   Status Achieved   PT LONG TERM GOAL #3   Title Walk with no device in home and community distances with [facility] for safety    Time 12   Period Weeks   Status Revised   PT LONG TERM GOAL #4   Title Walk stairs with one rail step over step with > 1/10 pain to assist with community ambulation and safety   Time 6   Period Weeks   Status On-going   PT LONG TERM GOAL #5   Title Active RT knee range equal LT    Time 12   Period Weeks   Status On-going   PT LONG TERM GOAL #6   Title return to normla home tasks without limits   Time 6   Period Weeks   Status On-going   PT LONG TERM GOAL #7   Title pt will decrease 10 MWT by > .95 m/sec  to assist with safe community ambulation with [facility]   Time 6   Period Weeks    Status Revised               Plan - 10/17/14 1116    Clinical Impression Statement Michael [name] presents to therapy today stating that he is feeling more sore today possibley due to the weather per pt report. no new goals met today. He continues to demonstrate extensor lag at -18 today during SLR. Worked on gait training with SPC stepping over cones forwatd and laterally to as well as stair training ascending/ descending one step at a time leading with L/ R. He demonstrated difficulty with ascending / descending with RLE due to pain utilizing railing and SPC. PT utilized a gait belt to assist with safety during gait training due to increased postural sway. Plan to progress gait and stair training as tolerated.    PT Next Visit Plan Quad strength, VMS/russian, progress to advanced strengthening, gait training with SPC and stais   PT Home Exercise Plan gait training   Consulted and Agree with Plan of Care Patient        Problem List Patient Active Problem List   Diagnosis Date Noted  . HNP (herniated nucleus pulposus), cervical 10/14/2014  . Chronic low back pain 10/14/2014  . Numbness and tingling of both upper extremities while sleeping 10/14/2014  . Frequent urination 09/03/2014  . Lower abdominal pain 09/03/2014  . Special screening for malignant neoplasms, colon   . Cervical stenosis of spine 05/16/2014  . Essential hypertension 05/16/2014  . Numbness and tingling of right arm 11/28/2013  . Dental abscess 04/26/2013  . Dysuria 04/26/2013  . HTN (hypertension) 04/04/2013  . CAD (coronary artery disease) 05/25/2011   Starr Lake PT, DPT, LAT, ATC  10/17/2014  12:31 PM    [facility] [address] [place], [place], [address] Phone: [phone]   Fax:  [phone]

## 2014-10-18 NOTE — Telephone Encounter (Signed)
Spoke with pt. He reports headache for first 3 days after stopping Crestor. Last 3 days no headache.  He will continue to hold and call me back next week with an update.

## 2014-10-22 ENCOUNTER — Ambulatory Visit: Payer: Medicaid Other | Admitting: Physical Therapy

## 2014-10-22 DIAGNOSIS — R29898 Other symptoms and signs involving the musculoskeletal system: Secondary | ICD-10-CM

## 2014-10-22 DIAGNOSIS — M25661 Stiffness of right knee, not elsewhere classified: Secondary | ICD-10-CM

## 2014-10-22 DIAGNOSIS — R609 Edema, unspecified: Secondary | ICD-10-CM

## 2014-10-22 DIAGNOSIS — R262 Difficulty in walking, not elsewhere classified: Secondary | ICD-10-CM

## 2014-10-22 NOTE — Therapy (Signed)
[place], [place], [address] Phone: [phone]   Fax:  [phone]  Physical Therapy Treatment  Patient Details  Name: Michael [name] MRN: [id number] Date of Birth: May 29, 1961 Referring Provider:  Tresa Garter, MD  Encounter Date: 10/22/2014      PT End of Session - 10/22/14 0936    Visit Number 27   Number of Visits 36   Date for PT Re-Evaluation 10/29/14   PT Start Time 0845   PT Stop Time 0946   PT Time Calculation (min) 61 min   Activity Tolerance Patient tolerated treatment well   Behavior During Therapy [facility] for tasks assessed/performed      Past Medical History  Diagnosis Date  . Anterior myocardial infarction     ST-elevation; S/P emergent  drug-eluting stenting of proximal left anterior descending  . Renal insufficiency   . Hypertension   . Alcohol abuse   . Coronary artery disease     Cath October 2012-moderate disease in RCA, Circumflex    Past Surgical History  Procedure Laterality Date  . Doppler echocardiography      Preserved left venticular function  . Leg surgery      left leg - has rod and 4 pins  . Patellar tendon repair Right 04/26/2014    Procedure: RIGHT PATELLA TENDON REPAIR;  Surgeon: Renette Butters, MD;  Location: [place];  Service: Orthopedics;  Laterality: Right;  . Colonoscopy N/A 08/16/2014    Procedure: COLONOSCOPY;  Surgeon: Danie Binder, MD;  [facility];  Service: Endoscopy;  Laterality: N/A;  10:15 AM    There were no vitals filed for this visit.  Visit Diagnosis:  Difficulty walking  Weakness of left leg  Stiffness of knee joint, right  Edema      Subjective Assessment - 10/22/14 0851    Subjective pt states that he is doing well today "my knee feels good today". He reports that he feels like his R leg is longer than his L.   Currently in Pain? Yes   Pain Score 1    Pain Location Knee   Pain  Orientation Right   Pain Descriptors / Indicators Aching   Pain Type Surgical pain   Pain Onset More than a month ago   Pain Frequency Intermittent   Pain Score 6   Pain Location Back   Pain Orientation Mid;Lower   Pain Descriptors / Indicators Aching            [facility] PT Assessment - 10/22/14 0001    AROM   Right Knee Extension -17  during SLR                     [facility] Adult PT Treatment/Exercise - 10/22/14 0001    Ambulation/Gait   Stair Management Technique One rail Right;With cane   Number of Stairs 30   Height of Stairs 4   Gait Comments walking stepping over cones 6 x   Knee/Hip Exercises: Aerobic   Stationary Bike Nu step level 8 x 6 minutes  goal: 350 steps: Achieved 400 steps   Knee/Hip Exercises: Machines for Strengthening   Cybex Knee Extension 1 plate x 10 bil x 2 sets   Cybex Knee Flexion 2 plates bil down left up x 15; 3 plates bil x 15   Cybex Leg Press 2 plates 2 x 15  1 set with LLE eccentric lowering   Knee/Hip Exercises: Standing  Terminal Knee Extension Strengthening;Right;2 sets;10 reps   Theraband Level (Terminal Knee Extension) Level 4 (Blue)   Lateral Step Up 10 reps;Step Height: 4";Right   Forward Step Up Right;10 reps;Step Height: 4"   Knee/Hip Exercises: Supine   Terminal Knee Extension AROM;Right;2 sets;20 reps   Straight Leg Raises AROM;Right;10 reps;2 Neurosurgeon R quad/VMO   Scientist, research (life sciences) Parameters 10 min,m 75 burst, 5/5 intensity to tolerance   Electrical Stimulation Goals Strength                  PT Short Term Goals - 10/22/14 0939    PT SHORT TERM GOAL #1   Title Independent with inital HEP affter review   Time 1   Period Weeks   Status Achieved   PT SHORT TERM GOAL #2   Title Improve active RT knee extension to -20 degrees   Time 4   Period Weeks   Status Achieved   PT SHORT TERM GOAL #3   Title  Improve active Rt knee flexion to 110 degrees   Time 4   Period Weeks   Status Achieved   PT SHORT TERM GOAL #4   Title report pain decr 25% or more   Time 4   Period Weeks   Status Achieved   PT SHORT TERM GOAL #5   Title walk without device in home safely and out of home with [facility] as able   Time 4   Period Weeks   Status On-going           PT Long Term Goals - 10/22/14 4081    PT LONG TERM GOAL #1   Title Independent with HEP as issued by last visit   Time 12   Period Weeks   Status On-going   PT LONG TERM GOAL #2   Title Report pain 1-2/10 max with walking community distances   Time 12   Period Weeks   Status Achieved   PT LONG TERM GOAL #3   Title Walk with no device in home and community distances with [facility] for safety    Time 12   Period Weeks   Status Revised   PT LONG TERM GOAL #4   Title Walk stairs with one rail step over step with > 1/10 pain to assist with community ambulation and safety   Time 6   Period Weeks   Status On-going   PT LONG TERM GOAL #5   Title Active RT knee range equal LT    Time 12   Period Weeks   Status On-going   PT LONG TERM GOAL #6   Title return to normla home tasks without limits   Time 6   Period Weeks   Status On-going   PT LONG TERM GOAL #7   Title pt will decrease 10 MWT by > .95 m/sec to assist with safe community ambulation with [facility]   Time 6   Period Weeks   Status Revised               Plan - 10/22/14 0937    Clinical Impression Statement [name] continues to present with some pain and extensor lag of -17 during SLR. No new goals met this visit. while seated he has a observable/palpable nodule proximal to the superior pole of the patella which makes it look like one leg is longer. during step downs he demonstrated 2 times where his R knee  tried ot buckel but he was able to catch it. He tolerated all exercises well, plan to progress with strengthening and gait training as tolerated.    PT Next Visit Plan Quad  strength, VMS/russian, progress to advanced strengthening, gait training with SPC and stairs   PT Home Exercise Plan gait training   Consulted and Agree with Plan of Care Patient        Problem List Patient Active Problem List   Diagnosis Date Noted  . HNP (herniated nucleus pulposus), cervical 10/14/2014  . Chronic low back pain 10/14/2014  . Numbness and tingling of both upper extremities while sleeping 10/14/2014  . Frequent urination 09/03/2014  . Lower abdominal pain 09/03/2014  . Special screening for malignant neoplasms, colon   . Cervical stenosis of spine 05/16/2014  . Essential hypertension 05/16/2014  . Numbness and tingling of right arm 11/28/2013  . Dental abscess 04/26/2013  . Dysuria 04/26/2013  . HTN (hypertension) 04/04/2013  . CAD (coronary artery disease) 05/25/2011   Starr Lake PT, DPT, LAT, ATC  10/22/2014  9:46 AM      [facility] [address] [place], [place], [address] Phone: [phone]   Fax:  [phone]

## 2014-10-24 ENCOUNTER — Ambulatory Visit: Payer: Medicaid Other | Admitting: Physical Therapy

## 2014-10-25 ENCOUNTER — Ambulatory Visit (HOSPITAL_COMMUNITY)
Admission: RE | Admit: 2014-10-25 | Discharge: 2014-10-25 | Disposition: A | Discharge status: Home / Self Care | Payer: Medicaid Other | Source: Ambulatory Visit | Attending: Physical Medicine & Rehabilitation | Admitting: Physical Medicine & Rehabilitation

## 2014-10-25 DIAGNOSIS — G8929 Other chronic pain: Secondary | ICD-10-CM | POA: Insufficient documentation

## 2014-10-25 DIAGNOSIS — M545 Low back pain, unspecified: Secondary | ICD-10-CM

## 2014-10-25 NOTE — Telephone Encounter (Signed)
Spoke with pt. He reports headaches are intermittent. He would like to resume Crestor and see if headaches return on a regular basis.  I told him this would be OK and to let us know if he cannot tolerate Crestor.

## 2014-10-29 ENCOUNTER — Ambulatory Visit: Payer: Medicaid Other | Admitting: Physical Therapy

## 2014-10-29 DIAGNOSIS — R262 Difficulty in walking, not elsewhere classified: Secondary | ICD-10-CM

## 2014-10-29 DIAGNOSIS — R609 Edema, unspecified: Secondary | ICD-10-CM

## 2014-10-29 DIAGNOSIS — R29898 Other symptoms and signs involving the musculoskeletal system: Secondary | ICD-10-CM

## 2014-10-29 DIAGNOSIS — M25661 Stiffness of right knee, not elsewhere classified: Secondary | ICD-10-CM | POA: Diagnosis not present

## 2014-10-29 NOTE — Therapy (Signed)
[place], [place], [address] Phone: [phone]   Fax:  [phone]  Physical Therapy Treatment  Patient Details  Name: Michael [name] MRN: [id number] Date of Birth: Mar 04, 1961 Referring Provider:  Tresa Garter, MD  Encounter Date: 10/29/2014      PT End of Session - 10/29/14 1303    Visit Number 28   Number of Visits 36   Date for PT Re-Evaluation 10/29/14   PT Start Time 0930   PT Stop Time 1015   PT Time Calculation (min) 45 min   Activity Tolerance Patient tolerated treatment well   Behavior During Therapy [facility] for tasks assessed/performed      Past Medical History  Diagnosis Date  . Anterior myocardial infarction     ST-elevation; S/P emergent  drug-eluting stenting of proximal left anterior descending  . Renal insufficiency   . Hypertension   . Alcohol abuse   . Coronary artery disease     Cath October 2012-moderate disease in RCA, Circumflex    Past Surgical History  Procedure Laterality Date  . Doppler echocardiography      Preserved left venticular function  . Leg surgery      left leg - has rod and 4 pins  . Patellar tendon repair Right 04/26/2014    Procedure: RIGHT PATELLA TENDON REPAIR;  Surgeon: Renette Butters, MD;  Location: [place];  Service: Orthopedics;  Laterality: Right;  . Colonoscopy N/A 08/16/2014    Procedure: COLONOSCOPY;  Surgeon: Danie Binder, MD;  [facility];  Service: Endoscopy;  Laterality: N/A;  10:15 AM    There were no vitals filed for this visit.  Visit Diagnosis:  Difficulty walking  Weakness of left leg  Stiffness of knee joint, right  Edema      Subjective Assessment - 10/29/14 0929    Subjective Pt reports that he is "hanging today" and that he has been doing pretty good since last visit.    Currently in Pain? Yes   Pain Score 2    Pain Location Knee   Pain Orientation Right   Pain Descriptors /  Indicators Aching   Pain Type Surgical pain   Pain Onset More than a month ago   Pain Frequency Intermittent   Aggravating Factors  walking for long periods of time   Pain Relieving Factors sitting down and resting   Pain Score 5   Pain Location Back   Pain Orientation Mid;Lower   Pain Descriptors / Indicators Aching            [facility] PT Assessment - 10/29/14 0001    Observation/Other Assessments   Focus on Therapeutic Outcomes (FOTO)  52% limited   AROM   Right Knee Extension -4  during regular knee ext, -15 during SLR   Right Knee Flexion 130   Strength   Right Hip Flexion 4+/5   Right Hip Extension 4+/5   Right Hip ABduction 5/5   Right Hip ADduction 5/5   Right Knee Flexion 4+/5   Right Knee Extension 3+/5   Left Knee Flexion 5/5   Left Knee Extension 5/5                     [facility] Adult PT Treatment/Exercise - 10/29/14 0937    Knee/Hip Exercises: Aerobic   Stationary Bike Nu step level 8 x 6 minutes  376 steps  PT Education - 10/29/14 1303    Education provided Yes   Education Details advanced HEP, HEP review, Exercises class handout   Person(s) Educated Patient   Methods Explanation   Comprehension Verbalized understanding          PT Short Term Goals - 10/29/14 0955    PT SHORT TERM GOAL #1   Title Independent with inital HEP affter review   Time 1   Period Weeks   Status Achieved   PT SHORT TERM GOAL #2   Title Improve active RT knee extension to -20 degrees   Time 4   Period Weeks   Status Achieved   PT SHORT TERM GOAL #3   Title Improve active Rt knee flexion to 110 degrees   Time 4   Period Weeks   Status Achieved   PT SHORT TERM GOAL #4   Title report pain decr 25% or more   Time 4   Period Weeks   Status Achieved   PT SHORT TERM GOAL #5   Title walk without device in home safely and out of home with [facility] as able   Time 4   Period Weeks   Status Achieved           PT Long Term Goals -  10/29/14 8676    PT LONG TERM GOAL #1   Title Independent with HEP as issued by last visit   Time 12   Period Weeks   Status Achieved   PT LONG TERM GOAL #2   Title Report pain 1-2/10 max with walking community distances   Time 12   Period Weeks   Status Achieved   PT LONG TERM GOAL #3   Title Walk with no device in home and community distances with [facility] for safety    Time 12   Period Weeks   Status Achieved   PT LONG TERM GOAL #4   Title Walk stairs with one rail step over step with > 1/10 pain to assist with community ambulation and safety   Period Weeks   Status Achieved   PT LONG TERM GOAL #5   Title Active RT knee range equal LT    Time 12   Period Weeks   Status Partially Met   PT LONG TERM GOAL #6   Title return to normla home tasks without limits   Time 6   Period Weeks   Status Achieved   PT LONG TERM GOAL #7   Title pt will decrease 10 MWT by > .95 m/sec to assist with safe community ambulation with SPC   Baseline .83 m/s   Time 6   Period Weeks   Status Partially Met               Plan - 10/29/14 1304    Clinical Impression Statement [name] has made great progress with decreased pain and improved AROM. he continues to demonstrate mild extensor lag at -15 degrees during SLR. He has met all goals except for LTG 5 and 7. He reports that he feels he can continue with his HEP routine independently to continue with his functional progression. Provided him with an exercise class handout which he reported he may attempt to attend. added advanced HEP exercises. He no longer requires skilled physical therapy and is able to I perform his exercise program.    PT Next Visit Plan D/C   PT Home Exercise Plan advanced HEP, HEP review   Consulted and Agree with Plan of Care  Patient        Problem List Patient Active Problem List   Diagnosis Date Noted  . HNP (herniated nucleus pulposus), cervical 10/14/2014  . Chronic low back pain 10/14/2014  . Numbness and  tingling of both upper extremities while sleeping 10/14/2014  . Frequent urination 09/03/2014  . Lower abdominal pain 09/03/2014  . Special screening for malignant neoplasms, colon   . Cervical stenosis of spine 05/16/2014  . Essential hypertension 05/16/2014  . Numbness and tingling of right arm 11/28/2013  . Dental abscess 04/26/2013  . Dysuria 04/26/2013  . HTN (hypertension) 04/04/2013  . CAD (coronary artery disease) 05/25/2011   Starr Lake PT, DPT, LAT, ATC  10/29/2014  1:07 PM    [facility] [place], [place], [address] Phone: [phone]   Fax:  [phone]      PHYSICAL THERAPY DISCHARGE SUMMARY  Visits from Start of Care: 28   Current functional level related to goals / functional outcomes: 52% limited   Remaining deficits: See goals   Education / Equipment: Advanced HEP handout, exercise class handout  Plan: Patient agrees to discharge.  Patient goals were partially met. Patient is being discharged due to being pleased with the current functional level.  ?????     Alonzo Loving PT, DPT, LAT, ATC  10/29/2014  1:09 PM

## 2014-10-29 NOTE — Patient Instructions (Signed)
   Bryker Fletchall PT, DPT, LAT, ATC  [facility] Phone: [phone]     

## 2014-10-31 ENCOUNTER — Encounter: Payer: No Typology Code available for payment source | Admitting: Physical Therapy

## 2014-11-05 ENCOUNTER — Ambulatory Visit: Payer: No Typology Code available for payment source | Admitting: Physical Medicine & Rehabilitation

## 2014-11-05 ENCOUNTER — Encounter: Payer: No Typology Code available for payment source | Admitting: Physical Therapy

## 2014-11-07 ENCOUNTER — Encounter: Payer: No Typology Code available for payment source | Admitting: Physical Therapy

## 2014-12-10 ENCOUNTER — Ambulatory Visit: Payer: No Typology Code available for payment source | Admitting: Physical Medicine & Rehabilitation

## 2014-12-10 ENCOUNTER — Ambulatory Visit: Payer: No Typology Code available for payment source

## 2014-12-18 ENCOUNTER — Other Ambulatory Visit: Payer: Self-pay

## 2014-12-18 MED ORDER — ROSUVASTATIN CALCIUM 20 MG PO TABS: 20.0000 mg | ORAL_TABLET | Freq: Every day | ORAL | Status: DC

## 2014-12-19 ENCOUNTER — Telehealth: Payer: Self-pay

## 2014-12-19 NOTE — Telephone Encounter (Signed)
Prior auth sent to [employer] for [employer].

## 2015-01-07 ENCOUNTER — Ambulatory Visit: Payer: No Typology Code available for payment source | Admitting: Physical Medicine & Rehabilitation

## 2015-01-22 ENCOUNTER — Telehealth: Payer: Self-pay

## 2015-01-22 NOTE — Telephone Encounter (Signed)
Approval for Rosuvastatin  daily obtained through Medicaid. Conf # [id number]. Sent this to [employer].

## 2015-01-28 ENCOUNTER — Telehealth: Payer: Self-pay | Admitting: Cardiovascular Disease

## 2015-01-28 ENCOUNTER — Other Ambulatory Visit: Payer: Self-pay | Admitting: Cardiovascular Disease

## 2015-01-28 NOTE — Telephone Encounter (Signed)
New Message  Pt calling about metoprolol refill- has OV w/ [place] 8/24. Please callback and discuss.

## 2015-03-18 ENCOUNTER — Other Ambulatory Visit: Payer: Self-pay | Admitting: Cardiovascular Disease

## 2015-03-30 ENCOUNTER — Other Ambulatory Visit: Payer: Self-pay | Admitting: Cardiovascular Disease

## 2015-04-07 ENCOUNTER — Encounter: Payer: No Typology Code available for payment source | Admitting: Cardiovascular Disease

## 2015-04-07 NOTE — Progress Notes (Signed)
cancelled

## 2015-04-14 ENCOUNTER — Other Ambulatory Visit: Payer: Self-pay | Admitting: Cardiovascular Disease

## 2015-05-05 ENCOUNTER — Encounter: Payer: Self-pay | Admitting: Physician Assistant

## 2015-05-05 ENCOUNTER — Ambulatory Visit (INDEPENDENT_AMBULATORY_CARE_PROVIDER_SITE_OTHER): Payer: Medicaid Other | Admitting: Physician Assistant

## 2015-05-05 ENCOUNTER — Other Ambulatory Visit: Payer: Self-pay | Admitting: Cardiovascular Disease

## 2015-05-05 DIAGNOSIS — I10 Essential (primary) hypertension: Secondary | ICD-10-CM | POA: Diagnosis not present

## 2015-05-05 DIAGNOSIS — I2510 Atherosclerotic heart disease of native coronary artery without angina pectoris: Secondary | ICD-10-CM | POA: Diagnosis not present

## 2015-05-05 DIAGNOSIS — R002 Palpitations: Secondary | ICD-10-CM | POA: Diagnosis not present

## 2015-05-05 DIAGNOSIS — E785 Hyperlipidemia, unspecified: Secondary | ICD-10-CM | POA: Insufficient documentation

## 2015-05-05 MED ORDER — NITROGLYCERIN 0.4 MG SL SUBL: 0.4000 mg | SUBLINGUAL_TABLET | SUBLINGUAL | Status: DC | PRN

## 2015-05-05 MED ORDER — ROSUVASTATIN CALCIUM 20 MG PO TABS: 20.0000 mg | ORAL_TABLET | Freq: Every day | ORAL | Status: DC

## 2015-05-05 MED ORDER — LISINOPRIL 20 MG PO TABS: 20.0000 mg | ORAL_TABLET | Freq: Every day | ORAL | Status: DC

## 2015-05-05 NOTE — Patient Instructions (Addendum)
Medication Instructions:  Your physician has recommended you make the following change in your medication:  1- Take an extra Metoprolol 25 mg  (1/2 tablet) by mouth as needed for palpitations.  Labwork: NONE  Testing/Procedures: NONE  Follow-Up: Your physician wants you to follow-up in: 6 months with Dr. [name]. You will receive a reminder letter in the mail two months in advance. If you don't receive a letter, please call our office to schedule the follow-up appointment.  If you need a refill on your cardiac medications before your next appointment, please call your pharmacy.  Pleas call our office if your palpitations continue so we can put you on a heart monitor.  Your physician recommends you start a weight loss program.  Your physician recommends you stop drinking [facility].

## 2015-05-05 NOTE — Assessment & Plan Note (Signed)
Lipid panel stable back in March. Continue Crestor.

## 2015-05-05 NOTE — Assessment & Plan Note (Signed)
Blood pressure stable ? ?

## 2015-05-05 NOTE — Progress Notes (Signed)
Cardiology Office Note   Date:  05/05/2015   ID:  Michael [name], DOB [birth year], MRN [id number]  PCP:  Jeanann Lewandowsky, MD  Cardiologist:  Dr. Clifton James  Chief Complaint: palpitations    History of Present Illness: Michael [name] is a 54 y.o. male who presents for follow up. history of CAD, HTN here today for cardiac follow up. He was admitted to [facility] 03/31/11 with anterior STEMI with occluded LAD now s/p DES x 1 proximal LAD. His cath was performed by Dr. Kirke Corin. Diffuse distal disease in the right posterolateral branch. The third OM was occluded and filled from left to left collaterals. Proximal LAD occlusion treated with 2.75 x 24 mm Promus Element DES x 1. He had a severe dye reaction. LV function was preserved by echo.  Eating smoked sausage for breakfast and lunch. Later that day he felt his heart skipping. It wasn't going fast, just skipping. He took an extra 1/2 lisinopril and he felt better. He had a little the next morning but it went away with his morning medicine. He drinks 12 oz mountain dew daily. He is not exercising and niece gained a lot of weight. Hehe's been off track. He denies any chest pain, dizziness, or presyncope.   Past Medical History  Diagnosis Date  . Anterior myocardial infarction [facility])     ST-elevation; S/P emergent  drug-eluting stenting of proximal left anterior descending  . Renal insufficiency   . Hypertension   . Alcohol abuse   . Coronary artery disease     Cath October 2012-moderate disease in RCA, Circumflex    Past Surgical History  Procedure Laterality Date  . Doppler echocardiography      Preserved left venticular function  . Leg surgery      left leg - has rod and 4 pins  . Patellar tendon repair Right 04/26/2014    Procedure: RIGHT PATELLA TENDON REPAIR;  Surgeon: Sheral Apley, MD;  Location: [facility];  Service: Orthopedics;  Laterality: Right;  . Colonoscopy N/A 08/16/2014    Procedure: COLONOSCOPY;   Surgeon: West Bali, MD;  [facility];  Service: Endoscopy;  Laterality: N/A;  10:15 AM     Current Outpatient Prescriptions  Medication Sig Dispense Refill  . acetaminophen (TYLENOL) 500 MG chewable tablet Chew 500 mg by mouth every 6 (six) hours as needed for pain.    Marland Kitchen aspirin EC 81 MG tablet Take 81 mg by mouth daily.      Marland Kitchen lisinopril (PRINIVIL,ZESTRIL) 20 MG tablet TAKE ONE TABLET BY MOUTH ONCE DAILY 30 tablet 3  . methocarbamol (ROBAXIN) 500 MG tablet Take 500 mg by mouth every 6 (six) hours as needed for muscle spasms.    . nitroGLYCERIN (NITROSTAT) 0.4 MG SL tablet Place 0.4 mg under the tongue every 5 (five) minutes as needed for chest pain.    . rosuvastatin (CRESTOR) 20 MG tablet Take 1 tablet (20 mg total) by mouth daily. 30 tablet 6  . metoprolol (LOPRESSOR) 50 MG tablet TAKE ONE TABLET BY MOUTH TWICE DAILY 60 tablet 11   No current facility-administered medications for this visit.    Allergies:   Ivp dye    Social History:  The patient  reports that he has never smoked. He does not have any smokeless tobacco history on file. He reports that he drinks alcohol. He reports that he does not use illicit drugs.   Family History:  The patient'sfamily history includes Cancer in his maternal aunt;  Diabetes in an other family member; Heart disease in an other family member; Hyperlipidemia in an other family member.    ROS:  Please see the history of present illness.   Otherwise, review of systems are positive for none.   All other systems are reviewed and negative.    PHYSICAL EXAM: VS:  BP 140/86 mmHg  Pulse 56  Ht  (1.727 m)  Wt 229 lb (103.874 kg)  BMI 34.83 kg/m2 , BMI Body mass index is 34.83 kg/(m^2). GEN: Obese, in no acute distress Neck: no JVD, HJR, carotid bruits, or masses Cardiac: RRR; no murmurs,gallop, rubs, thrill or heave,  Respiratory:  clear to auscultation bilaterally, normal work of breathing GI: soft, nontender, nondistended, +  BS MS: no deformity or atrophy Extremities: without cyanosis, clubbing, edema, good distal pulses bilaterally.  Skin: warm and dry, no rash Neuro:  Strength and sensation are intact    EKG:  EKG is ordered today. The ekg ordered today demonstrates sinus bradycardia 50 bpm nonspecific ST-T wave changes, no acute change  Recent Labs: 09/03/2014: ALT 23; BUN 20; Creat 1.08; Hemoglobin 15.0; Platelets 239; Potassium 5.2; Sodium 137; TSH 1.022    Lipid Panel    Component Value Date/Time   CHOL 196 09/03/2014 1028   TRIG 82 09/03/2014 1028   HDL 34* 09/03/2014 1028   CHOLHDL 5.8 09/03/2014 1028   VLDL 16 09/03/2014 1028   LDLCALC 146* 09/03/2014 1028   LDLDIRECT 164.1 05/04/2013 1107      Wt Readings from Last 3 Encounters:  05/05/15 229 lb (103.874 kg)  09/03/14 227 lb (102.967 kg)  08/07/14 230 lb (104.327 kg)      Other studies Reviewed: Additional studies/ records that were reviewed today include and review of the records demonstrates:  Cardiac cath 03/31/11: 1. Right coronary artery: The vessel is large in size and dominant.   There is a mild 20% disease in the mid segment with minor luminal   irregularities distally. The PDA is normal in size and free of   significant disease. First posterolateral branch is normal in size   with 40% proximal disease. The second posterolateral branch is   relatively large in size with 50% disease at the ostium followed by   a 70% lesion proximally and diffuse 80% disease distally. The RCA   gives collaterals to the left anterior descending artery as well as   OM-3.   2. Left main coronary artery: The vessel is normal in size without   significant disease.   3. Left anterior descending artery: The vessel is normal in size and   is occluded proximally a small septal branch. The distal LAD does   not have significant disease. The first diagonal branch is normal   to large in size and has 2 branches. The superior branch is   overall  normal. The inferior branch has a 90% proximal disease.   4. Left circumflex artery: The vessel is normal in size and   nondominant. The first obtuse marginal is normal in size with a   mild proximal disease. The second OM is normal in size with no   significant disease. Third OM appears to be occluded with   collaterals supplied from the distal LAD and the right coronary   artery.   ASSESSMENT AND PLAN:  Palpitations Patient had episode of palpitations after eating a lot of sausage. He is also drinking 12 ounces of [facility] daily. He has gained a lot of weight. Recommend cutting  back on his caffeine. Exercise and weight loss program. May take an extra half a metoprolol as needed for palpitations. If he continues to have them we will place an event recorder on him. Follow-up with Dr.McAlhany in 6 months. He had full blood work back in March that was normal.  CAD (coronary artery disease) Stable without chest pain  Essential hypertension Blood pressure stable  Hyperlipidemia Lipid panel stable back in March. Continue Crestor.    Signed, Jacolyn ReedyMichele Obi Scrima, PA-C  05/05/2015 1:28 PM    [facility] [address], [place], [place]  [address] Phone: [phone]; Fax: [phone]

## 2015-05-05 NOTE — Assessment & Plan Note (Signed)
Stable without chest pain 

## 2015-05-05 NOTE — Assessment & Plan Note (Signed)
Patient had episode of palpitations after eating a lot of sausage. He is also drinking 12 ounces of [facility] daily. He has gained a lot of weight. Recommend cutting back on his caffeine. Exercise and weight loss program. May take an extra half a metoprolol as needed for palpitations. If he continues to have them we will place an event recorder on him. Follow-up with Dr.McAlhany in 6 months. He had full blood work back in March that was normal.

## 2015-05-19 ENCOUNTER — Ambulatory Visit: Payer: Medicaid Other | Attending: Family Medicine

## 2015-06-23 ENCOUNTER — Ambulatory Visit: Payer: No Typology Code available for payment source | Admitting: Cardiovascular Disease

## 2015-08-04 ENCOUNTER — Ambulatory Visit: Payer: Medicaid Other | Admitting: Internal Medicine

## 2015-09-04 ENCOUNTER — Other Ambulatory Visit: Payer: Self-pay | Admitting: Cardiovascular Disease

## 2015-09-04 NOTE — Telephone Encounter (Signed)
REFILL 

## 2015-10-06 ENCOUNTER — Other Ambulatory Visit: Payer: Self-pay | Admitting: Cardiovascular Disease

## 2015-11-03 ENCOUNTER — Telehealth: Payer: Self-pay | Admitting: Cardiovascular Disease

## 2015-11-03 NOTE — Telephone Encounter (Signed)
New message   Pt is calling for an appt, he said he wants to be seen sooner   Patient c/o Palpitations:  High priority if patient c/o lightheadedness and shortness of breath.  1. How long have you been having palpitations? LAST WEEK 2. Are you currently experiencing lightheadedness and shortness of breath? NO  3. Have you checked your BP and heart rate? (document readings)pt verbalized that he did not check it and that he needs another unit  4. Are you experiencing any other symptoms? No

## 2015-11-03 NOTE — Telephone Encounter (Signed)
Spoke with pt.  He reports a "weird feeling" on right side near shoulder on Saturday.  Similar to the palpitations he had prior to last office visit.  Also reports occasional chest tightness. No pain or tightness at this time.  He is due for 6 month follow up appt.  Appt made for him to see Dr. [name] on 11/07/15 at 3:15.

## 2015-11-07 ENCOUNTER — Ambulatory Visit (INDEPENDENT_AMBULATORY_CARE_PROVIDER_SITE_OTHER): Payer: Medicaid Other | Admitting: Cardiovascular Disease

## 2015-11-07 ENCOUNTER — Encounter: Payer: Self-pay | Admitting: Cardiovascular Disease

## 2015-11-07 VITALS — BP 122/84 | HR 55 | Ht 68.0 in | Wt 231.0 lb

## 2015-11-07 DIAGNOSIS — I2510 Atherosclerotic heart disease of native coronary artery without angina pectoris: Secondary | ICD-10-CM | POA: Diagnosis not present

## 2015-11-07 DIAGNOSIS — I10 Essential (primary) hypertension: Secondary | ICD-10-CM

## 2015-11-07 DIAGNOSIS — E785 Hyperlipidemia, unspecified: Secondary | ICD-10-CM | POA: Diagnosis not present

## 2015-11-07 NOTE — Patient Instructions (Signed)

## 2015-11-07 NOTE — Progress Notes (Signed)
Chief Complaint  Patient presents with  . Follow-up    History of Present Illness: 55 yo AAM with history of CAD, HTN here today for cardiac follow up. He was admitted to [facility] 03/31/11 with anterior STEMI with occluded LAD now s/p DES x 1 proximal LAD. His cath was performed by Dr. Kirke Corin. Diffuse distal disease in the right posterolateral branch. The third OM was occluded and filled from left to left collaterals. Proximal LAD occlusion treated with 2.75 x 24 mm Promus Element DES x 1. He had a severe dye reaction. LV function was preserved by echo.    He is here today for follow up. He tells me that he has been doing well. On episode of right sided chest pain after eating porkchops last week. Lasted for several minutes. Associated with palpitations. No exertional chest pain or dyspnea. No near syncope or syncope.   Primary Care Physician: Jeanann Lewandowsky, MD   Past Medical History  Diagnosis Date  . Anterior myocardial infarction [facility])     ST-elevation; S/P emergent  drug-eluting stenting of proximal left anterior descending  . Renal insufficiency   . Hypertension   . Alcohol abuse   . Coronary artery disease     Cath October 2012-moderate disease in RCA, Circumflex    Past Surgical History  Procedure Laterality Date  . Doppler echocardiography      Preserved left venticular function  . Leg surgery      left leg - has rod and 4 pins  . Patellar tendon repair Right 04/26/2014    Procedure: RIGHT PATELLA TENDON REPAIR;  Surgeon: Sheral Apley, MD;  Location: [facility];  Service: Orthopedics;  Laterality: Right;  . Colonoscopy N/A 08/16/2014    Procedure: COLONOSCOPY;  Surgeon: West Bali, MD;  [facility];  Service: Endoscopy;  Laterality: N/A;  10:15 AM    Allergies  Allergen Reactions  . Ivp Dye [Iodinated Diagnostic Agents] Swelling    Social History   Social History  . Marital Status: Significant Other    Spouse Name: N/A  .  Number of Children: 0  . Years of Education: N/A   Occupational History  . Not on file.   Social History Main Topics  . Smoking status: Never Smoker   . Smokeless tobacco: Not on file  . Alcohol Use: Yes     Comment: Drinks occassionally  . Drug Use: No  . Sexual Activity: Not on file   Other Topics Concern  . Not on file   Social History Narrative    Family History  Problem Relation Age of Onset  . Hyperlipidemia    . Diabetes    . Heart disease    . Cancer Maternal Aunt    Meds:  Current Outpatient Prescriptions on File Prior to Visit  Medication Sig Dispense Refill  . acetaminophen (TYLENOL) 500 MG chewable tablet Chew 500 mg by mouth every 6 (six) hours as needed for pain.    Marland Kitchen aspirin EC 81 MG tablet Take 81 mg by mouth daily.      Marland Kitchen lisinopril (PRINIVIL,ZESTRIL) 20 MG tablet Take 1 tablet (20 mg total) by mouth daily. 30 tablet 6  . methocarbamol (ROBAXIN) 500 MG tablet Take 500 mg by mouth every 6 (six) hours as needed for muscle spasms.    . metoprolol (LOPRESSOR) 50 MG tablet TAKE ONE TABLET BY MOUTH TWICE DAILY 60 tablet 11  . nitroGLYCERIN (NITROSTAT) 0.4 MG SL tablet Place 1 tablet (0.4  mg total) under the tongue every 5 (five) minutes as needed for chest pain. 25 tablet 3  . rosuvastatin (CRESTOR) 20 MG tablet TAKE ONE TABLET BY MOUTH ONCE DAILY. NEED OFFICE VISIT 30 tablet 1   No current facility-administered medications on file prior to visit.   Review of Systems:  As stated in the HPI and otherwise negative.   BP 122/84 mmHg  Pulse 55  Ht 5\' 8"  (1.727 m)  Wt 231 lb (104.781 kg)  BMI 35.13 kg/m2  SpO2 96%  Physical Examination: General: Well developed, well nourished, NAD HEENT: OP clear, mucus membranes moist SKIN: warm, dry. No rashes. Neuro: No focal deficits Musculoskeletal: Muscle strength 5/5 all ext Psychiatric: Mood and affect normal Neck: No JVD, no carotid bruits, no thyromegaly, no lymphadenopathy. Lungs:Clear bilaterally, no wheezes,  rhonci, crackles Cardiovascular: Regular rate and rhythm. No murmurs, gallops or rubs. Abdomen:Soft. Bowel sounds present. Non-tender.  Extremities: No lower extremity edema. Pulses are 2 + in the bilateral DP/PT.  Cardiac cath 03/31/11: 1. Right coronary artery: The vessel is large in size and dominant.  There is a mild 20% disease in the mid segment with minor luminal  irregularities distally. The PDA is normal in size and free of  significant disease. First posterolateral branch is normal in size  with 40% proximal disease. The second posterolateral branch is  relatively large in size with 50% disease at the ostium followed by  a 70% lesion proximally and diffuse 80% disease distally. The RCA  gives collaterals to the left anterior descending artery as well as  OM-3.  2. Left main coronary artery: The vessel is normal in size without  significant disease.  3. Left anterior descending artery: The vessel is normal in size and  is occluded proximally a small septal branch. The distal LAD does  not have significant disease. The first diagonal branch is normal  to large in size and has 2 branches. The superior branch is  overall normal. The inferior branch has a 90% proximal disease.  4. Left circumflex artery: The vessel is normal in size and  nondominant. The first obtuse marginal is normal in size with a  mild proximal disease. The second OM is normal in size with no  significant disease. Third OM appears to be occluded with  collaterals supplied from the distal LAD and the right coronary  Artery.  EKG:  EKG is ordered today. The ekg ordered today demonstrates sinus brady, rate 55 bpm.   Recent Labs: No results found for requested labs within last 365 days.   Lipid Panel    Component Value Date/Time   CHOL 196 09/03/2014 1028   TRIG 82 09/03/2014 1028   HDL 34* 09/03/2014 1028   CHOLHDL 5.8 09/03/2014 1028   VLDL 16 09/03/2014 1028   LDLCALC 146* 09/03/2014 1028    LDLDIRECT 164.1 05/04/2013 1107     Wt Readings from Last 3 Encounters:  11/07/15 231 lb (104.781 kg)  05/05/15 229 lb (103.874 kg)  09/03/14 227 lb (102.967 kg)     Other studies Reviewed: Additional studies/ records that were reviewed today include: . Review of the above records demonstrates:    Assessment and Plan:   1. CAD: His recent chest pain does not sound cardiac. He initially presented with an anterior STEMI 03/31/11. A DES was placed in LAD. Moderate disease in other vessels. No recent exertional chest pains. Continue ASA, beta blocker, statin and Ace-inh.   2. HTN: BP controlled. Continue metoprolol and Lisinopril.  3. HLD: Continue statin. LDL not at goal but this is followed in primary care and he states he will have labs next week.   Current medicines are reviewed at length with the patient today.  The patient does not have concerns regarding medicines.  The following changes have been made:  no change  Labs/ tests ordered today include:   Orders Placed This Encounter  Procedures  . EKG 12-Lead    Disposition:   FU with me in 6 months  Signed, Verne Carrow, MD 11/07/2015 3:54 PM    [facility] [address] [place], [place], [place]  [address] Phone: [phone]; Fax: [phone]

## 2015-12-29 ENCOUNTER — Other Ambulatory Visit: Payer: Self-pay | Admitting: Cardiovascular Disease

## 2016-01-29 ENCOUNTER — Other Ambulatory Visit: Payer: Self-pay | Admitting: Cardiovascular Disease

## 2016-01-30 ENCOUNTER — Telehealth: Payer: Self-pay

## 2016-01-30 NOTE — Telephone Encounter (Signed)
Prior auth obtained for Rosuvastatin 20mg  thru [employer]. PA- [id number] is good through 01/24/2017. Local pharmacy notifed.

## 2016-02-13 ENCOUNTER — Ambulatory Visit: Payer: Medicaid Other

## 2016-03-31 ENCOUNTER — Encounter: Payer: Self-pay | Admitting: Physician Assistant

## 2016-04-01 ENCOUNTER — Encounter: Payer: Self-pay | Admitting: Internal Medicine

## 2016-04-01 ENCOUNTER — Ambulatory Visit: Payer: Medicaid Other | Attending: Internal Medicine | Admitting: Internal Medicine

## 2016-04-01 VITALS — BP 145/77 | HR 57 | Temp 98.5°F | Resp 18 | Ht 68.0 in | Wt 222.0 lb

## 2016-04-01 DIAGNOSIS — I10 Essential (primary) hypertension: Secondary | ICD-10-CM | POA: Diagnosis not present

## 2016-04-01 DIAGNOSIS — I2510 Atherosclerotic heart disease of native coronary artery without angina pectoris: Secondary | ICD-10-CM | POA: Insufficient documentation

## 2016-04-01 DIAGNOSIS — I252 Old myocardial infarction: Secondary | ICD-10-CM | POA: Insufficient documentation

## 2016-04-01 DIAGNOSIS — Z888 Allergy status to other drugs, medicaments and biological substances status: Secondary | ICD-10-CM | POA: Insufficient documentation

## 2016-04-01 DIAGNOSIS — M25561 Pain in right knee: Secondary | ICD-10-CM | POA: Diagnosis not present

## 2016-04-01 DIAGNOSIS — M4802 Spinal stenosis, cervical region: Secondary | ICD-10-CM | POA: Insufficient documentation

## 2016-04-01 DIAGNOSIS — Z79899 Other long term (current) drug therapy: Secondary | ICD-10-CM | POA: Insufficient documentation

## 2016-04-01 DIAGNOSIS — Z7982 Long term (current) use of aspirin: Secondary | ICD-10-CM | POA: Insufficient documentation

## 2016-04-01 DIAGNOSIS — M25562 Pain in left knee: Secondary | ICD-10-CM | POA: Diagnosis not present

## 2016-04-01 DIAGNOSIS — G8929 Other chronic pain: Secondary | ICD-10-CM | POA: Insufficient documentation

## 2016-04-01 DIAGNOSIS — Z96651 Presence of right artificial knee joint: Secondary | ICD-10-CM | POA: Insufficient documentation

## 2016-04-01 LAB — POCT GLYCOSYLATED HEMOGLOBIN (HGB A1C): HEMOGLOBIN A1C: 6

## 2016-04-01 LAB — COMPLETE METABOLIC PANEL WITH GFR
ALT: 23 U/L (ref 9–46)
AST: 16 U/L (ref 10–35)
Albumin: 3.9 g/dL (ref 3.6–5.1)
Alkaline Phosphatase: 76 U/L (ref 40–115)
BILIRUBIN TOTAL: 0.3 mg/dL (ref 0.2–1.2)
BUN: 19 mg/dL (ref 7–25)
CHLORIDE: 102 mmol/L (ref 98–110)
CO2: 24 mmol/L (ref 20–31)
CREATININE: 1.18 mg/dL (ref 0.70–1.33)
Calcium: 9.7 mg/dL (ref 8.6–10.3)
GFR, Est African American: 80 mL/min (ref >=60)
GFR, Est Non African American: 69 mL/min (ref >=60)
GLUCOSE: 102 mg/dL — AB (ref 65–99)
Potassium: 4.5 mmol/L (ref 3.5–5.3)
SODIUM: 136 mmol/L (ref 135–146)
TOTAL PROTEIN: 7.2 g/dL (ref 6.1–8.1)

## 2016-04-01 LAB — TSH: TSH: 1.88 m[IU]/L (ref 0.40–4.50)

## 2016-04-01 LAB — LIPID PANEL
Cholesterol: 257 mg/dL — ABNORMAL HIGH (ref 125–200)
HDL: 37 mg/dL — ABNORMAL LOW (ref >=40)
LDL CALC: 183 mg/dL — AB (ref <130)
TRIGLYCERIDES: 187 mg/dL — AB (ref <150)
Total CHOL/HDL Ratio: 6.9 Ratio — ABNORMAL HIGH (ref <=5.0)
VLDL: 37 mg/dL — AB (ref <30)

## 2016-04-01 MED ORDER — LISINOPRIL 20 MG PO TABS: 20.0000 mg | ORAL_TABLET | Freq: Every day | ORAL | 3 refills | Status: DC

## 2016-04-01 MED ORDER — METOPROLOL TARTRATE 50 MG PO TABS: 50.0000 mg | ORAL_TABLET | Freq: Two times a day (BID) | ORAL | 3 refills | Status: DC

## 2016-04-01 MED ORDER — METHOCARBAMOL 500 MG PO TABS: 500.0000 mg | ORAL_TABLET | Freq: Four times a day (QID) | ORAL | 3 refills | Status: DC | PRN

## 2016-04-01 MED ORDER — ROSUVASTATIN CALCIUM 20 MG PO TABS: 20.0000 mg | ORAL_TABLET | Freq: Every day | ORAL | 3 refills | Status: DC
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
## 2016-04-01 NOTE — Patient Instructions (Signed)
DASH Eating Plan DASH stands for "Dietary Approaches to Stop Hypertension." The DASH eating plan is a healthy eating plan that has been shown to reduce high blood pressure (hypertension). Additional health benefits may include reducing the risk of type 2 diabetes mellitus, heart disease, and stroke. The DASH eating plan may also help with weight loss. WHAT DO I NEED TO KNOW ABOUT THE DASH EATING PLAN? For the DASH eating plan, you will follow these general guidelines:  Choose foods with a percent daily value for sodium of less than 5% (as listed on the food label).  Use salt-free seasonings or herbs instead of table salt or sea salt.  Check with your health care provider or pharmacist before using salt substitutes.  Eat lower-sodium products, often labeled as "lower sodium" or "no salt added."  Eat fresh foods.  Eat more vegetables, fruits, and low-fat dairy products.  Choose whole grains. Look for the word "whole" as the first word in the ingredient list.  Choose fish and skinless chicken or turkey more often than red meat. Limit fish, poultry, and meat to 6 oz (170 g) each day.  Limit sweets, desserts, sugars, and sugary drinks.  Choose heart-healthy fats.  Limit cheese to 1 oz (28 g) per day.  Eat more home-cooked food and less restaurant, buffet, and fast food.  Limit fried foods.  Cook foods using methods other than frying.  Limit canned vegetables. If you do use them, rinse them well to decrease the sodium.  When eating at a restaurant, ask that your food be prepared with less salt, or no salt if possible. WHAT FOODS CAN I EAT? Seek help from a dietitian for individual calorie needs. Grains Whole grain or whole wheat bread. Brown rice. Whole grain or whole wheat pasta. Quinoa, bulgur, and whole grain cereals. Low-sodium cereals. Corn or whole wheat flour tortillas. Whole grain cornbread. Whole grain crackers. Low-sodium crackers. Vegetables Fresh or frozen vegetables  (raw, steamed, roasted, or grilled). Low-sodium or reduced-sodium tomato and vegetable juices. Low-sodium or reduced-sodium tomato sauce and paste. Low-sodium or reduced-sodium canned vegetables.  Fruits All fresh, canned (in natural juice), or frozen fruits. Meat and Other Protein Products Ground beef (85% or leaner), grass-fed beef, or beef trimmed of fat. Skinless chicken or turkey. Ground chicken or turkey. Pork trimmed of fat. All fish and seafood. Eggs. Dried beans, peas, or lentils. Unsalted nuts and seeds. Unsalted canned beans. Dairy Low-fat dairy products, such as skim or 1% milk, 2% or reduced-fat cheeses, low-fat ricotta or cottage cheese, or plain low-fat yogurt. Low-sodium or reduced-sodium cheeses. Fats and Oils Tub margarines without trans fats. Light or reduced-fat mayonnaise and salad dressings (reduced sodium). Avocado. Safflower, olive, or canola oils. Natural peanut or almond butter. Other Unsalted popcorn and pretzels. The items listed above may not be a complete list of recommended foods or beverages. Contact your dietitian for more options. WHAT FOODS ARE NOT RECOMMENDED? Grains White bread. White pasta. White rice. Refined cornbread. Bagels and croissants. Crackers that contain trans fat. Vegetables Creamed or fried vegetables. Vegetables in a cheese sauce. Regular canned vegetables. Regular canned tomato sauce and paste. Regular tomato and vegetable juices. Fruits Dried fruits. Canned fruit in light or heavy syrup. Fruit juice. Meat and Other Protein Products Fatty cuts of meat. Ribs, chicken wings, bacon, sausage, bologna, salami, chitterlings, fatback, hot dogs, bratwurst, and packaged luncheon meats. Salted nuts and seeds. Canned beans with salt. Dairy Whole or 2% milk, cream, half-and-half, and cream cheese. Whole-fat or sweetened yogurt. Full-fat   cheeses or blue cheese. Nondairy creamers and whipped toppings. Processed cheese, cheese spreads, or cheese  curds. Condiments Onion and garlic salt, seasoned salt, table salt, and sea salt. Canned and packaged gravies. Worcestershire sauce. Tartar sauce. Barbecue sauce. Teriyaki sauce. Soy sauce, including reduced sodium. Steak sauce. Fish sauce. Oyster sauce. Cocktail sauce. Horseradish. Ketchup and mustard. Meat flavorings and tenderizers. Bouillon cubes. Hot sauce. Tabasco sauce. Marinades. Taco seasonings. Relishes. Fats and Oils Butter, stick margarine, lard, shortening, ghee, and bacon fat. Coconut, palm kernel, or palm oils. Regular salad dressings. Other Pickles and olives. Salted popcorn and pretzels. The items listed above may not be a complete list of foods and beverages to avoid. Contact your dietitian for more information. WHERE CAN I FIND MORE INFORMATION? National Heart, Lung, and Blood Institute: www.nhlbi.nih.gov/health/health-topics/topics/dash/   This information is not intended to replace advice given to you by your health care provider. Make sure you discuss any questions you have with your health care provider.   Document Released: 05/20/2011 Document Revised: 06/21/2014 Document Reviewed: 04/04/2013 Elsevier Interactive Patient Education 2016 Elsevier Inc. Hypertension Hypertension, commonly called high blood pressure, is when the force of blood pumping through your arteries is too strong. Your arteries are the blood vessels that carry blood from your heart throughout your body. A blood pressure reading consists of a higher number over a lower number, such as 110/72. The higher number (systolic) is the pressure inside your arteries when your heart pumps. The lower number (diastolic) is the pressure inside your arteries when your heart relaxes. Ideally you want your blood pressure below 120/80. Hypertension forces your heart to work harder to pump blood. Your arteries may become narrow or stiff. Having untreated or uncontrolled hypertension can cause heart attack, stroke, kidney  disease, and other problems. RISK FACTORS Some risk factors for high blood pressure are controllable. Others are not.  Risk factors you cannot control include:   Race. You may be at higher risk if you are African American.  Age. Risk increases with age.  Gender. Men are at higher risk than women before age 45 years. After age 65, women are at higher risk than men. Risk factors you can control include:  Not getting enough exercise or physical activity.  Being overweight.  Getting too much fat, sugar, calories, or salt in your diet.  Drinking too much alcohol. SIGNS AND SYMPTOMS Hypertension does not usually cause signs or symptoms. Extremely high blood pressure (hypertensive crisis) may cause headache, anxiety, shortness of breath, and nosebleed. DIAGNOSIS To check if you have hypertension, your health care provider will measure your blood pressure while you are seated, with your arm held at the level of your heart. It should be measured at least twice using the same arm. Certain conditions can cause a difference in blood pressure between your right and left arms. A blood pressure reading that is higher than normal on one occasion does not mean that you need treatment. If it is not clear whether you have high blood pressure, you may be asked to return on a different day to have your blood pressure checked again. Or, you may be asked to monitor your blood pressure at home for 1 or more weeks. TREATMENT Treating high blood pressure includes making lifestyle changes and possibly taking medicine. Living a healthy lifestyle can help lower high blood pressure. You may need to change some of your habits. Lifestyle changes may include:  Following the DASH diet. This diet is high in fruits, vegetables, and whole grains.   It is low in salt, red meat, and added sugars.  Keep your sodium intake below 2,300 mg per day.  Getting at least 30-45 minutes of aerobic exercise at least 4 times per  week.  Losing weight if necessary.  Not smoking.  Limiting alcoholic beverages.  Learning ways to reduce stress. Your health care provider may prescribe medicine if lifestyle changes are not enough to get your blood pressure under control, and if one of the following is true:  You are 45-33 years of age and your systolic blood pressure is above 140.  You are 79 years of age or older, and your systolic blood pressure is above 150.  Your diastolic blood pressure is above 90.  You have diabetes, and your systolic blood pressure is over 140 or your diastolic blood pressure is over 90.  You have kidney disease and your blood pressure is above 140/90.  You have heart disease and your blood pressure is above 140/90. Your personal target blood pressure may vary depending on your medical conditions, your age, and other factors. HOME CARE INSTRUCTIONS  Have your blood pressure rechecked as directed by your health care provider.   Take medicines only as directed by your health care provider. Follow the directions carefully. Blood pressure medicines must be taken as prescribed. The medicine does not work as well when you skip doses. Skipping doses also puts you at risk for problems.  Do not smoke.   Monitor your blood pressure at home as directed by your health care provider. SEEK MEDICAL CARE IF:   You think you are having a reaction to medicines taken.  You have recurrent headaches or feel dizzy.  You have swelling in your ankles.  You have trouble with your vision. SEEK IMMEDIATE MEDICAL CARE IF:  You develop a severe headache or confusion.  You have unusual weakness, numbness, or feel faint.  You have severe chest or abdominal pain.  You vomit repeatedly.  You have trouble breathing. MAKE SURE YOU:   Understand these instructions.  Will watch your condition.  Will get help right away if you are not doing well or get worse.   This information is not intended to  replace advice given to you by your health care provider. Make sure you discuss any questions you have with your health care provider.   Document Released: 05/31/2005 Document Revised: 10/15/2014 Document Reviewed: 03/23/2013 Elsevier Interactive Patient Education 2016 Elsevier Inc. Knee Pain Knee pain is a very common symptom and can have many causes. Knee pain often goes away when you follow your health care provider's instructions for relieving pain and discomfort at home. However, knee pain can develop into a condition that needs treatment. Some conditions may include:  Arthritis caused by wear and tear (osteoarthritis).  Arthritis caused by swelling and irritation (rheumatoid arthritis or gout).  A cyst or growth in your knee.  An infection in your knee joint.  An injury that will not heal.  Damage, swelling, or irritation of the tissues that support your knee (torn ligaments or tendinitis). If your knee pain continues, additional tests may be ordered to diagnose your condition. Tests may include X-rays or other imaging studies of your knee. You may also need to have fluid removed from your knee. Treatment for ongoing knee pain depends on the cause, but treatment may include:  Medicines to relieve pain or swelling.  Steroid injections in your knee.  Physical therapy.  Surgery. HOME CARE INSTRUCTIONS  Take medicines only as directed by  your health care provider.  Rest your knee and keep it raised (elevated) while you are resting.  Do not do things that cause or worsen pain.  Avoid high-impact activities or exercises, such as running, jumping rope, or doing jumping jacks.  Apply ice to the knee area:  Put ice in a plastic bag.  Place a towel between your skin and the bag.  Leave the ice on for 20 minutes, 2-3 times a day.  Ask your health care provider if you should wear an elastic knee support.  Keep a pillow under your knee when you sleep.  Lose weight if you  are overweight. Extra weight can put pressure on your knee.  Do not use any tobacco products, including cigarettes, chewing tobacco, or electronic cigarettes. If you need help quitting, ask your health care provider. Smoking may slow the healing of any bone and joint problems that you may have. SEEK MEDICAL CARE IF:  Your knee pain continues, changes, or gets worse.  You have a fever along with knee pain.  Your knee buckles or locks up.  Your knee becomes more swollen. SEEK IMMEDIATE MEDICAL CARE IF:   Your knee joint feels hot to the touch.  You have chest pain or trouble breathing.   This information is not intended to replace advice given to you by your health care provider. Make sure you discuss any questions you have with your health care provider.   Document Released: 03/28/2007 Document Revised: 06/21/2014 Document Reviewed: 01/14/2014 Elsevier Interactive Patient Education [employer]2016 Elsevier Inc.

## 2016-04-01 NOTE — Progress Notes (Signed)
Patient is here for Ortho referral due to multiple falls.  Patient denies pain at this time.  Patient has taken medication today and patient has eaten today.  Patient declined the flu vaccine today.

## 2016-04-02 LAB — VITAMIN D 25 HYDROXY (VIT D DEFICIENCY, FRACTURES): VIT D 25 HYDROXY: 26 ng/mL — AB (ref 30–100)

## 2016-04-05 ENCOUNTER — Telehealth: Payer: Self-pay | Admitting: Internal Medicine

## 2016-04-05 NOTE — Telephone Encounter (Signed)
Patient called the office regarding his lab results. Please follow up.  Thank you.

## 2016-04-06 ENCOUNTER — Ambulatory Visit: Payer: Medicaid Other | Admitting: Physician Assistant
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
## 2016-04-07 ENCOUNTER — Ambulatory Visit (INDEPENDENT_AMBULATORY_CARE_PROVIDER_SITE_OTHER): Payer: Medicaid Other | Admitting: Physician Assistant

## 2016-04-07 ENCOUNTER — Encounter: Payer: Self-pay | Admitting: Physician Assistant

## 2016-04-07 VITALS — BP 134/78 | HR 56 | Ht 68.0 in | Wt 224.8 lb

## 2016-04-07 DIAGNOSIS — I10 Essential (primary) hypertension: Secondary | ICD-10-CM

## 2016-04-07 DIAGNOSIS — I2510 Atherosclerotic heart disease of native coronary artery without angina pectoris: Secondary | ICD-10-CM

## 2016-04-07 DIAGNOSIS — E785 Hyperlipidemia, unspecified: Secondary | ICD-10-CM

## 2016-04-07 MED ORDER — ROSUVASTATIN CALCIUM 40 MG PO TABS: 40.0000 mg | ORAL_TABLET | Freq: Every day | ORAL | 3 refills | Status: DC

## 2016-04-07 NOTE — Patient Instructions (Addendum)
Medication Instructions:  Your physician has recommended you make the following change in your medication:  1.  INCREASE the Crestor to 40 mg taking 1 tablet daily   Labwork: None ordered  Testing/Procedures: None ordered  Follow-Up: Your physician wants you to follow-up in: 9 MONTHS WITH DR. [name]  You will receive a reminder letter in the mail two months in advance. If you don't receive a letter, please call our office to schedule the follow-up appointment.   Any Other Special Instructions Will Be Listed Below (If Applicable).     If you need a refill on your cardiac medications before your next appointment, please call your pharmacy.

## 2016-04-09 NOTE — Telephone Encounter (Signed)
Voicemail states to give a call back to Erendira Crabtree with [facility] at [phone]. Medical Assistant left message on patient's home and cell voicemail.  

## 2016-04-09 NOTE — Telephone Encounter (Signed)
-----   Message from Quentin Angstlugbemiga E Jegede, MD sent at 04/07/2016  4:52 PM EDT ----- Please inform patient that his lab results are mostly normal except for cholesterol level that is very high. Encourage patient to continue taking his cholesterol medication as prescribed, and also please limit saturated fat to no more than 7% of your calories, limit cholesterol to 200 mg/day, increase fiber and exercise as tolerated. If needed we may add another cholesterol lowering medication to your regimen. Also encourage patient to get over the counter vitamin D supplement, take daily to boost his vitamin D level.

## 2016-04-15 ENCOUNTER — Telehealth: Payer: Self-pay | Admitting: Internal Medicine

## 2016-04-15 NOTE — Telephone Encounter (Signed)
Pt returning call from nurse to discuss lab results  °

## 2016-04-19 NOTE — Telephone Encounter (Signed)
MA left a VM informing patient of cholesterol level being high and Crestor being ordered the [facility] on [address]. Patient also made aware of Vitamin D level needing a supplement. Patient advised to pick up OTC vitamin D and begin taking daily. Patient is aware of nutritional changes that need to be made including increasing fiber and exercise while limiting saturated fats and cholesterol. Patient also aware of levels being checked in three months and if levels remain the same patient may require an additionally cholesterol lowering medication. [facility] provided [facility] contact number for any questions or concerns.

## 2016-04-20 ENCOUNTER — Ambulatory Visit (INDEPENDENT_AMBULATORY_CARE_PROVIDER_SITE_OTHER): Payer: Self-pay | Admitting: Orthopaedic Surgery

## 2016-04-27 ENCOUNTER — Ambulatory Visit (INDEPENDENT_AMBULATORY_CARE_PROVIDER_SITE_OTHER): Payer: Medicaid Other

## 2016-04-27 ENCOUNTER — Ambulatory Visit (INDEPENDENT_AMBULATORY_CARE_PROVIDER_SITE_OTHER): Payer: Medicaid Other | Admitting: Orthopaedic Surgery

## 2016-04-27 ENCOUNTER — Encounter (INDEPENDENT_AMBULATORY_CARE_PROVIDER_SITE_OTHER): Payer: Self-pay | Admitting: Orthopaedic Surgery

## 2016-04-27 DIAGNOSIS — M25561 Pain in right knee: Secondary | ICD-10-CM

## 2016-04-27 DIAGNOSIS — G8929 Other chronic pain: Secondary | ICD-10-CM

## 2016-04-27 DIAGNOSIS — M1711 Unilateral primary osteoarthritis, right knee: Secondary | ICD-10-CM

## 2016-04-27 MED ORDER — MELOXICAM 7.5 MG PO TABS: 7.5000 mg | ORAL_TABLET | Freq: Two times a day (BID) | ORAL | 2 refills | Status: DC | PRN

## 2016-04-27 NOTE — Progress Notes (Signed)
Office Visit Note   Patient: Michael [name]           Date of Birth: 04/30/1961           MRN: [id number] Visit Date: 04/27/2016              Requested by: Quentin Angstlugbemiga E Jegede, MD [address] [place], [place] [address] PCP: Jeanann LewandowskyJEGEDE, OLUGBEMIGA, MD   Assessment & Plan: Visit Diagnoses:  1. Chronic pain of right knee   2. Unilateral primary osteoarthritis, right knee     Plan: Right knee injection was performed today under sterile conditions. Handicap pass was also given. Encourage the patient to work on Dance movement psychotherapistquad strengthening. I think the pain is mainly from his deconditioning although he does have degenerative changes within his right knee. Hopefully the injection will give him some relief. In terms of the left lower leg I cannot reliably say that the nail is causing him problems. Certainly the fracture looks to be nicely healed. I don't find any radiographic or clinical signs of pathology. I will see him back as needed.  Follow-Up Instructions: Return if symptoms worsen or fail to improve.   Orders:  Orders Placed This Encounter  Procedures  . XR KNEE 3 VIEW RIGHT  . XR Tibia/Fibula Left   Meds ordered this encounter  Medications  . meloxicam (MOBIC) 7.5 MG tablet    Sig: Take 1 tablet (7.5 mg total) by mouth 2 (two) times daily as needed for pain.    Dispense:  30 tablet    Refill:  2      Procedures: No procedures performed   Clinical Data: No additional findings.   Subjective: Chief Complaint  Patient presents with  . Right Knee - Pain  . Left Leg - Pain    HPI Patient is 55 year old gentleman new to our practice for right knee pain and generalized left lower extremity pain for 2 weeks. He walks with a cane. He endorses cracking and popping mainly in his right knee. In his left leg he is status post IM nailing of the tibia in 2011 [place] [facility]. He states that he has pain throughout his left lower extremity. He denies any  constitutional symptoms. For his right knee he is status post right patellar tendon repair to the patella by Dr. Renaye Rakersim Murphy in 2015. He states that his knees are weak and he has fallen multiple times. The pain is worse with activity and better with rest. Review of Systems Negative except for history of present illness.  Objective: Vital Signs: There were no vitals taken for this visit.  Physical Exam Well-developed well-nourished no acute distress alert and oriented 3 nonlabored breathing normal judgments affect abdomen soft no lymphadenopathy Ortho Exam Exam of the right knee shows a well-healed midline surgical scar. He does have noticeable atrophy of the quadriceps muscle belly compared to the contralateral side. He has no effusion. Collaterals and cruciates are stable. Exam of the left lower extremity shows well-healed surgical scars. He really doesn't have tenderness with palpation.   Specialty Comments:  No specialty comments available.  Imaging: Xr Tibia/fibula Left  Result Date: 04/27/2016 S/p tibial IM nail with no complications.  Mild DJD of left knee.  Xr Knee 3 View Right  Result Date: 04/27/2016 Moderate DJD of right knee    PMFS History: Patient Active Problem List   Diagnosis Date Noted  . Chronic pain of both knees 04/01/2016  . Palpitations 05/05/2015  . Hyperlipidemia  05/05/2015  . HNP (herniated nucleus pulposus), cervical 10/14/2014  . Chronic low back pain 10/14/2014  . Numbness and tingling of both upper extremities while sleeping 10/14/2014  . Frequent urination 09/03/2014  . Lower abdominal pain 09/03/2014  . Special screening for malignant neoplasms, colon   . Cervical stenosis of spine 05/16/2014  . Essential hypertension 05/16/2014  . Numbness and tingling of right arm 11/28/2013  . Dental abscess 04/26/2013  . Dysuria 04/26/2013  . HTN (hypertension) 04/04/2013  . CAD (coronary artery disease) 05/25/2011   Past Medical History:  Diagnosis  Date  . Alcohol abuse   . Anterior myocardial infarction [facility](HCC)    ST-elevation; S/P emergent  drug-eluting stenting of proximal left anterior descending  . Coronary artery disease    Cath October 2012-moderate disease in RCA, Circumflex  . Hypertension   . Renal insufficiency     Family History  Problem Relation Age of Onset  . Cancer Maternal Aunt   . Hyperlipidemia    . Diabetes    . Heart disease      Past Surgical History:  Procedure Laterality Date  . COLONOSCOPY N/A 08/16/2014   Procedure: COLONOSCOPY;  Surgeon: West BaliSandi L Fields, MD;  [facility];  Service: Endoscopy;  Laterality: N/A;  10:15 AM  . DOPPLER ECHOCARDIOGRAPHY     Preserved left venticular function  . LEG SURGERY     left leg - has rod and 4 pins  . PATELLAR TENDON REPAIR Right 04/26/2014   Procedure: RIGHT PATELLA TENDON REPAIR;  Surgeon: Sheral Apleyimothy D Murphy, MD;  Location: [facility];  Service: Orthopedics;  Laterality: Right;   Social History   Occupational History  . Not on file.   Social History Main Topics  . Smoking status: Never Smoker  . Smokeless tobacco: Never Used  . Alcohol use Yes     Comment: Drinks occassionally  . Drug use: No  . Sexual activity: Not on file

## 2016-04-28 ENCOUNTER — Telehealth (INDEPENDENT_AMBULATORY_CARE_PROVIDER_SITE_OTHER): Payer: Self-pay | Admitting: Orthopaedic Surgery

## 2016-04-28 NOTE — Telephone Encounter (Signed)
Called pt back to advise handicap ready at the front desk.

## 2016-04-28 NOTE — Telephone Encounter (Signed)
Pt states he was to get a apllication for a handicap sticker at his office visit on 04/27/16, but he forgot. He would like to pick it up today if possible.

## 2016-04-29 ENCOUNTER — Telehealth: Payer: Self-pay | Admitting: Internal Medicine

## 2016-04-29 DIAGNOSIS — M4802 Spinal stenosis, cervical region: Secondary | ICD-10-CM

## 2016-04-29 NOTE — Telephone Encounter (Signed)
Patient called to request referral to  Rehab                        due to  back problems, having trouble walking and back pain  Please follow up with patient

## 2016-04-29 NOTE — Telephone Encounter (Signed)
Do you approve this referral?

## 2016-04-30 NOTE — Telephone Encounter (Signed)
Referral to PT has been placed for difficulty walking per PCP verbal approval.

## 2016-04-30 NOTE — Telephone Encounter (Signed)
Yes. Physical Therapy please

## 2016-05-03 ENCOUNTER — Telehealth (INDEPENDENT_AMBULATORY_CARE_PROVIDER_SITE_OTHER): Payer: Self-pay

## 2016-05-03 NOTE — Telephone Encounter (Signed)
Please call pt in regards to possible reaction on meds given.  Does not want to take meds without talking with you

## 2016-05-04 NOTE — Telephone Encounter (Signed)
Called pt this morning to advise to stop meloxicam. Can cause heart attack. (side effects)

## 2016-05-11 ENCOUNTER — Ambulatory Visit: Payer: Medicaid Other | Attending: Internal Medicine | Admitting: Physical Therapy

## 2016-05-11 ENCOUNTER — Encounter: Payer: Self-pay | Admitting: Physical Therapy

## 2016-05-11 DIAGNOSIS — M25561 Pain in right knee: Secondary | ICD-10-CM | POA: Insufficient documentation

## 2016-05-11 DIAGNOSIS — M25662 Stiffness of left knee, not elsewhere classified: Secondary | ICD-10-CM | POA: Diagnosis present

## 2016-05-11 DIAGNOSIS — G8929 Other chronic pain: Secondary | ICD-10-CM | POA: Diagnosis present

## 2016-05-11 DIAGNOSIS — M25562 Pain in left knee: Secondary | ICD-10-CM | POA: Diagnosis present

## 2016-05-11 DIAGNOSIS — M25661 Stiffness of right knee, not elsewhere classified: Secondary | ICD-10-CM | POA: Diagnosis present

## 2016-05-11 DIAGNOSIS — R262 Difficulty in walking, not elsewhere classified: Secondary | ICD-10-CM | POA: Diagnosis present

## 2016-05-11 DIAGNOSIS — M542 Cervicalgia: Secondary | ICD-10-CM | POA: Insufficient documentation
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
## 2016-05-13 ENCOUNTER — Telehealth: Payer: Self-pay | Admitting: Internal Medicine

## 2016-05-13 ENCOUNTER — Encounter (INDEPENDENT_AMBULATORY_CARE_PROVIDER_SITE_OTHER): Payer: Self-pay | Admitting: Orthopaedic Surgery

## 2016-05-13 ENCOUNTER — Ambulatory Visit (INDEPENDENT_AMBULATORY_CARE_PROVIDER_SITE_OTHER): Payer: Medicaid Other | Admitting: Orthopaedic Surgery

## 2016-05-13 ENCOUNTER — Telehealth (INDEPENDENT_AMBULATORY_CARE_PROVIDER_SITE_OTHER): Payer: Self-pay | Admitting: Orthopaedic Surgery

## 2016-05-13 DIAGNOSIS — M25561 Pain in right knee: Secondary | ICD-10-CM

## 2016-05-13 DIAGNOSIS — G8929 Other chronic pain: Secondary | ICD-10-CM | POA: Diagnosis not present

## 2016-05-13 DIAGNOSIS — M25562 Pain in left knee: Secondary | ICD-10-CM | POA: Diagnosis not present

## 2016-05-13 NOTE — Progress Notes (Signed)
Office Visit Note   Patient: Michael [name]           Date of Birth: 08/03/1960           MRN: [id number] Visit Date: 05/13/2016              Requested by: Michael Angstlugbemiga E Jegede, MD [address] [place], [place] [address] PCP: Michael LewandowskyJEGEDE, OLUGBEMIGA, MD   Assessment & Plan: Visit Diagnoses: No diagnosis found.  Plan: Patient is doing quite well from injection. I recommend quad strengthening to help with gait and ambulation. Follow-up with me as needed.  Follow-Up Instructions: Return if symptoms worsen or fail to improve.   Orders:  No orders of the defined types were placed in this encounter.  No orders of the defined types were placed in this encounter.     Procedures: No procedures performed   Clinical Data: No additional findings.   Subjective: Chief Complaint  Patient presents with  . Right Knee - Pain, Follow-up    Patient comes back today for right knee pain. He had an injection about 2 weeks ago which did help. He is admitted with a cane. [facility] He is taking Tylenol as needed.    Review of Systems  Constitutional: Negative.   HENT: Negative.   Eyes: Negative.   Respiratory: Negative.   Cardiovascular: Negative.   Gastrointestinal: Negative.   Endocrine: Negative.   Genitourinary: Negative.   Musculoskeletal: Negative.   Skin: Negative.   Allergic/Immunologic: Negative.   Neurological: Negative.   Hematological: Negative.   Psychiatric/Behavioral: Negative.      Objective: Vital Signs: There were no vitals taken for this visit.  Physical Exam Well-developed nourished acute distress alert and 3 Ortho Exam Exam of the right knee shows no interval changes. No effusion. Specialty Comments:  No specialty comments available.  Imaging: No results found.   PMFS History: Patient Active Problem List   Diagnosis Date Noted  . Chronic pain of both knees 04/01/2016  . Palpitations 05/05/2015  . Hyperlipidemia  05/05/2015  . HNP (herniated nucleus pulposus), cervical 10/14/2014  . Chronic low back pain 10/14/2014  . Numbness and tingling of both upper extremities while sleeping 10/14/2014  . Frequent urination 09/03/2014  . Lower abdominal pain 09/03/2014  . Special screening for malignant neoplasms, colon   . Cervical stenosis of spine 05/16/2014  . Essential hypertension 05/16/2014  . Numbness and tingling of right arm 11/28/2013  . Dental abscess 04/26/2013  . Dysuria 04/26/2013  . HTN (hypertension) 04/04/2013  . CAD (coronary artery disease) 05/25/2011   Past Medical History:  Diagnosis Date  . Alcohol abuse   . Anterior myocardial infarction [facility](HCC)    ST-elevation; S/P emergent  drug-eluting stenting of proximal left anterior descending  . Coronary artery disease    Cath October 2012-moderate disease in RCA, Circumflex  . Hypertension   . Renal insufficiency     Family History  Problem Relation Age of Onset  . Cancer Maternal Aunt   . Hyperlipidemia    . Diabetes    . Heart disease      Past Surgical History:  Procedure Laterality Date  . COLONOSCOPY N/A 08/16/2014   Procedure: COLONOSCOPY;  Surgeon: West BaliSandi L Fields, MD;  [facility];  Service: Endoscopy;  Laterality: N/A;  10:15 AM  . DOPPLER ECHOCARDIOGRAPHY     Preserved left venticular function  . LEG SURGERY     left leg - has rod and 4 pins  .  PATELLAR TENDON REPAIR Right 04/26/2014   Procedure: RIGHT PATELLA TENDON REPAIR;  Surgeon: Sheral Apleyimothy D Murphy, MD;  Location: [facility];  Service: Orthopedics;  Laterality: Right;   Social History   Occupational History  . Not on file.   Social History Main Topics  . Smoking status: Never Smoker  . Smokeless tobacco: Never Used  . Alcohol use Yes     Comment: Drinks occassionally  . Drug use: No  . Sexual activity: Not on file

## 2016-05-13 NOTE — Telephone Encounter (Signed)
He should make sure with his cardiologist first

## 2016-05-13 NOTE — Telephone Encounter (Signed)
Called patient to advise per Dr Roda ShuttersXu he would like for him to call his cardiologist to make sure he can take Rx

## 2016-05-13 NOTE — Telephone Encounter (Signed)
Are the meds you gave patient today safe for him. Please see message below. Thanks

## 2016-05-13 NOTE — Telephone Encounter (Signed)
Patient is needing more understanding about a medication prescribed by doctor for knee.  He was prescribed meloxicam and patient is concerned because he was informed that a serious side effect could lead to have heart attack. Patient is very concerned due to having heart attack before. Patient doesn't under that medication is prn and not daily medication. Please follow up with patient.

## 2016-05-17 NOTE — Telephone Encounter (Signed)
Medical Assistant left message on patient's home and cell voicemail. Voicemail states to give a call back to Nubia with [facility] at [phone].  

## 2016-06-23 ENCOUNTER — Ambulatory Visit: Payer: Medicaid Other | Admitting: Internal Medicine

## 2016-08-11 ENCOUNTER — Encounter: Payer: Self-pay | Admitting: Internal Medicine

## 2016-08-11 ENCOUNTER — Ambulatory Visit: Payer: Medicaid Other | Attending: Internal Medicine | Admitting: Internal Medicine

## 2016-08-11 VITALS — BP 137/69 | HR 61 | Temp 98.7°F | Resp 18 | Ht 68.0 in | Wt 224.8 lb

## 2016-08-11 DIAGNOSIS — I252 Old myocardial infarction: Secondary | ICD-10-CM | POA: Diagnosis not present

## 2016-08-11 DIAGNOSIS — R200 Anesthesia of skin: Secondary | ICD-10-CM | POA: Insufficient documentation

## 2016-08-11 DIAGNOSIS — I739 Peripheral vascular disease, unspecified: Secondary | ICD-10-CM

## 2016-08-11 DIAGNOSIS — R350 Frequency of micturition: Secondary | ICD-10-CM | POA: Diagnosis not present

## 2016-08-11 DIAGNOSIS — M545 Low back pain: Secondary | ICD-10-CM | POA: Diagnosis not present

## 2016-08-11 DIAGNOSIS — I2510 Atherosclerotic heart disease of native coronary artery without angina pectoris: Secondary | ICD-10-CM | POA: Diagnosis not present

## 2016-08-11 DIAGNOSIS — I10 Essential (primary) hypertension: Secondary | ICD-10-CM | POA: Diagnosis not present

## 2016-08-11 DIAGNOSIS — Z7982 Long term (current) use of aspirin: Secondary | ICD-10-CM | POA: Insufficient documentation

## 2016-08-11 DIAGNOSIS — Z79899 Other long term (current) drug therapy: Secondary | ICD-10-CM | POA: Insufficient documentation

## 2016-08-11 DIAGNOSIS — R351 Nocturia: Secondary | ICD-10-CM | POA: Diagnosis not present

## 2016-08-11 HISTORY — DX: Peripheral vascular disease, unspecified: I73.9

## 2016-08-11 LAB — CBC WITH DIFFERENTIAL/PLATELET
BASOS PCT: 1 %
Basophils Absolute: 50 cells/uL (ref 0–200)
Eosinophils Absolute: 100 cells/uL (ref 15–500)
Eosinophils Relative: 2 %
HCT: 44 % (ref 38.5–50.0)
Hemoglobin: 14.4 g/dL (ref 13.2–17.1)
LYMPHS PCT: 36 %
Lymphs Abs: 1800 cells/uL (ref 850–3900)
MCH: 27.3 pg (ref 27.0–33.0)
MCHC: 32.7 g/dL (ref 32.0–36.0)
MCV: 83.5 fL (ref 80.0–100.0)
MONOS PCT: 12 %
MPV: 10.3 fL (ref 7.5–12.5)
Monocytes Absolute: 600 cells/uL (ref 200–950)
NEUTROS PCT: 49 %
Neutro Abs: 2450 cells/uL (ref 1500–7800)
PLATELETS: 223 10*3/uL (ref 140–400)
RBC: 5.27 MIL/uL (ref 4.20–5.80)
RDW: 13.6 % (ref 11.0–15.0)
WBC: 5 10*3/uL (ref 3.8–10.8)

## 2016-08-11 LAB — COMPLETE METABOLIC PANEL WITH GFR
ALT: 29 U/L (ref 9–46)
AST: 22 U/L (ref 10–35)
Albumin: 3.8 g/dL (ref 3.6–5.1)
Alkaline Phosphatase: 70 U/L (ref 40–115)
BUN: 19 mg/dL (ref 7–25)
CALCIUM: 9.6 mg/dL (ref 8.6–10.3)
CHLORIDE: 105 mmol/L (ref 98–110)
CO2: 23 mmol/L (ref 20–31)
CREATININE: 1.16 mg/dL (ref 0.70–1.33)
GFR, Est African American: 81 mL/min (ref >=60)
GFR, Est Non African American: 70 mL/min (ref >=60)
Glucose, Bld: 96 mg/dL (ref 65–99)
POTASSIUM: 4.4 mmol/L (ref 3.5–5.3)
Sodium: 139 mmol/L (ref 135–146)
Total Bilirubin: 0.4 mg/dL (ref 0.2–1.2)
Total Protein: 6.6 g/dL (ref 6.1–8.1)

## 2016-08-11 LAB — LIPID PANEL
CHOL/HDL RATIO: 3.6 ratio (ref <5.0)
CHOLESTEROL: 133 mg/dL (ref <200)
HDL: 37 mg/dL — ABNORMAL LOW (ref >40)
LDL CALC: 77 mg/dL (ref <100)
TRIGLYCERIDES: 96 mg/dL (ref <150)
VLDL: 19 mg/dL (ref <30)

## 2016-08-11 LAB — PSA: PSA: 1 ng/mL (ref <=4.0)

## 2016-08-11 LAB — URINALYSIS, COMPLETE
BACTERIA UA: NONE SEEN [HPF]
Bilirubin Urine: NEGATIVE
CASTS: NONE SEEN [LPF]
CRYSTALS: NONE SEEN [HPF]
GLUCOSE, UA: NEGATIVE
Ketones, ur: NEGATIVE
LEUKOCYTES UA: NEGATIVE
Nitrite: NEGATIVE
PROTEIN: NEGATIVE
SQUAMOUS EPITHELIAL / LPF: NONE SEEN [HPF] (ref <=5)
Specific Gravity, Urine: 1.021 (ref 1.001–1.035)
WBC, UA: NONE SEEN WBC/HPF (ref <=5)
YEAST: NONE SEEN [HPF]
pH: 5.5 (ref 5.0–8.0)

## 2016-08-11 NOTE — Progress Notes (Signed)
Patient is here for Back Pain FU  Patient complains of lower back pain being present and scaled ata 7 currently. Patient complains of intermittent left leg pain being present also.  Patient has taken medication today. Patient has eaten today.  Patient declined the flu vaccine today.

## 2016-08-11 NOTE — Patient Instructions (Signed)
Peripheral Vascular Disease Peripheral vascular disease (PVD) is a disease of the blood vessels that are not part of your heart and brain. A simple term for PVD is poor circulation. In most cases, PVD narrows the blood vessels that carry blood from your heart to the rest of your body. This can result in a decreased supply of blood to your arms, legs, and internal organs, like your stomach or kidneys. However, it most often affects a person's lower legs and feet. There are two types of PVD.  Organic PVD. This is the more common type. It is caused by damage to the structure of blood vessels.  Functional PVD. This is caused by conditions that make blood vessels contract and tighten (spasm). Without treatment, PVD tends to get worse over time. PVD can also lead to acute ischemic limb. This is when an arm or limb suddenly has trouble getting enough blood. This is a medical emergency. Follow these instructions at home:  Take medicines only as told by your doctor.  Do not use any tobacco products, including cigarettes, chewing tobacco, or electronic cigarettes. If you need help quitting, ask your doctor.  Lose weight if you are overweight, and maintain a healthy weight as told by your doctor.  Eat a diet that is low in fat and cholesterol. If you need help, ask your doctor.  Exercise regularly. Ask your doctor for some good activities for you.  Take good care of your feet.  Wear comfortable shoes that fit well.  Check your feet often for any cuts or sores. Contact a doctor if:  You have cramps in your legs while walking.  You have leg pain when you are at rest.  You have coldness in a leg or foot.  Your skin changes.  You are unable to get or have an erection (erectile dysfunction).  You have cuts or sores on your feet that are not healing. Get help right away if:  Your arm or leg turns cold and blue.  Your arms or legs become red, warm, swollen, painful, or numb.  You have  chest pain or trouble breathing.  You suddenly have weakness in your face, arm, or leg.  You become very confused or you cannot speak.  You suddenly have a very bad headache.  You suddenly cannot see. This information is not intended to replace advice given to you by your health care provider. Make sure you discuss any questions you have with your health care provider. Document Released: 08/25/2009 Document Revised: 11/06/2015 Document Reviewed: 11/08/2013 Elsevier Interactive Patient Education  2017 [employer] DASH Eating Plan DASH stands for "Dietary Approaches to Stop Hypertension." The DASH eating plan is a healthy eating plan that has been shown to reduce high blood pressure (hypertension). It may also reduce your risk for type 2 diabetes, heart disease, and stroke. The DASH eating plan may also help with weight loss. What are tips for following this plan? General guidelines   Avoid eating more than 2,300 mg (milligrams) of salt (sodium) a day. If you have hypertension, you may need to reduce your sodium intake to 1,500 mg a day.  Limit alcohol intake to no more than 1 drink a day for nonpregnant women and 2 drinks a day for men. One drink equals 12 oz of beer, 5 oz of wine, or 1 oz of hard liquor.  Work with your health care provider to maintain a healthy body weight or to lose weight. Ask what an ideal weight is for you.  Get at least 30 minutes of exercise that causes your heart to beat faster (aerobic exercise) most days of the week. Activities may include walking, swimming, or biking.  Work with your health care provider or diet and nutrition specialist (dietitian) to adjust your eating plan to your individual calorie needs. Reading food labels   Check food labels for the amount of sodium per serving. Choose foods with less than 5 percent of the Daily Value of sodium. Generally, foods with less than 300 mg of sodium per serving fit into this eating plan.  To find whole  grains, look for the word "whole" as the first word in the ingredient list. Shopping   Buy products labeled as "low-sodium" or "no salt added."  Buy fresh foods. Avoid canned foods and premade or frozen meals. Cooking   Avoid adding salt when cooking. Use salt-free seasonings or herbs instead of table salt or sea salt. Check with your health care provider or pharmacist before using salt substitutes.  Do not fry foods. Cook foods using healthy methods such as baking, boiling, grilling, and broiling instead.  Cook with heart-healthy oils, such as olive, canola, soybean, or sunflower oil. Meal planning    Eat a balanced diet that includes:  5 or more servings of fruits and vegetables each day. At each meal, try to fill half of your plate with fruits and vegetables.  Up to 6-8 servings of whole grains each day.  Less than 6 oz of lean meat, poultry, or fish each day. A 3-oz serving of meat is about the same size as a deck of cards. One egg equals 1 oz.  2 servings of low-fat dairy each day.  A serving of nuts, seeds, or beans 5 times each week.  Heart-healthy fats. Healthy fats called Omega-3 fatty acids are found in foods such as flaxseeds and coldwater fish, like sardines, salmon, and mackerel.  Limit how much you eat of the following:  Canned or prepackaged foods.  Food that is high in trans fat, such as fried foods.  Food that is high in saturated fat, such as fatty meat.  Sweets, desserts, sugary drinks, and other foods with added sugar.  Full-fat dairy products.  Do not salt foods before eating.  Try to eat at least 2 vegetarian meals each week.  Eat more home-cooked food and less restaurant, buffet, and fast food.  When eating at a restaurant, ask that your food be prepared with less salt or no salt, if possible. What foods are recommended? The items listed may not be a complete list. Talk with your dietitian about what dietary choices are best for you. Grains    Whole-grain or whole-wheat bread. Whole-grain or whole-wheat pasta. Brown rice. Orpah Cobb. Bulgur. Whole-grain and low-sodium cereals. Pita bread. Low-fat, low-sodium crackers. Whole-wheat flour tortillas. Vegetables  Fresh or frozen vegetables (raw, steamed, roasted, or grilled). Low-sodium or reduced-sodium tomato and vegetable juice. Low-sodium or reduced-sodium tomato sauce and tomato paste. Low-sodium or reduced-sodium canned vegetables. Fruits  All fresh, dried, or frozen fruit. Canned fruit in natural juice (without added sugar). Meat and other protein foods  Skinless chicken or [place]. Ground chicken or [place]. Pork with fat trimmed off. Fish and seafood. Egg whites. Dried beans, peas, or lentils. Unsalted nuts, nut butters, and seeds. Unsalted canned beans. Lean cuts of beef with fat trimmed off. Low-sodium, lean deli meat. Dairy  Low-fat (1%) or fat-free (skim) milk. Fat-free, low-fat, or reduced-fat cheeses. Nonfat, low-sodium ricotta or cottage cheese. Low-fat or nonfat  yogurt. Low-fat, low-sodium cheese. Fats and oils  Soft margarine without trans fats. Vegetable oil. Low-fat, reduced-fat, or light mayonnaise and salad dressings (reduced-sodium). Canola, safflower, olive, soybean, and sunflower oils. Avocado. Seasoning and other foods  Herbs. Spices. Seasoning mixes without salt. Unsalted popcorn and pretzels. Fat-free sweets. What foods are not recommended? The items listed may not be a complete list. Talk with your dietitian about what dietary choices are best for you. Grains  Baked goods made with fat, such as croissants, muffins, or some breads. Dry pasta or rice meal packs. Vegetables  Creamed or fried vegetables. Vegetables in a cheese sauce. Regular canned vegetables (not low-sodium or reduced-sodium). Regular canned tomato sauce and paste (not low-sodium or reduced-sodium). Regular tomato and vegetable juice (not low-sodium or reduced-sodium). Rosita Fire. Olives. Fruits   Canned fruit in a light or heavy syrup. Fried fruit. Fruit in cream or butter sauce. Meat and other protein foods  Fatty cuts of meat. Ribs. Fried meat. Tomasa Blase. Sausage. Bologna and other processed lunch meats. Salami. Fatback. Hotdogs. Bratwurst. Salted nuts and seeds. Canned beans with added salt. Canned or smoked fish. Whole eggs or egg yolks. Chicken or [place] with skin. Dairy  Whole or 2% milk, cream, and half-and-half. Whole or full-fat cream cheese. Whole-fat or sweetened yogurt. Full-fat cheese. Nondairy creamers. Whipped toppings. Processed cheese and cheese spreads. Fats and oils  Butter. Stick margarine. Lard. Shortening. Ghee. Bacon fat. Tropical oils, such as coconut, palm kernel, or palm oil. Seasoning and other foods  Salted popcorn and pretzels. Onion salt, garlic salt, seasoned salt, table salt, and sea salt. Worcestershire sauce. Tartar sauce. Barbecue sauce. Teriyaki sauce. Soy sauce, including reduced-sodium. Steak sauce. Canned and packaged gravies. Fish sauce. Oyster sauce. Cocktail sauce. Horseradish that you find on the shelf. Ketchup. Mustard. Meat flavorings and tenderizers. Bouillon cubes. Hot sauce and Tabasco sauce. Premade or packaged marinades. Premade or packaged taco seasonings. Relishes. Regular salad dressings. Where to find more information:  National Heart, Lung, and Blood Institute: PopSteam.is  American Heart Association: www.heart.org Summary  The DASH eating plan is a healthy eating plan that has been shown to reduce high blood pressure (hypertension). It may also reduce your risk for type 2 diabetes, heart disease, and stroke.  With the DASH eating plan, you should limit salt (sodium) intake to 2,300 mg a day. If you have hypertension, you may need to reduce your sodium intake to 1,500 mg a day.  When on the DASH eating plan, aim to eat more fresh fruits and vegetables, whole grains, lean proteins, low-fat dairy, and heart-healthy fats.  Work  with your health care provider or diet and nutrition specialist (dietitian) to adjust your eating plan to your individual calorie needs. This information is not intended to replace advice given to you by your health care provider. Make sure you discuss any questions you have with your health care provider. Document Released: 05/20/2011 Document Revised: 05/24/2016 Document Reviewed: 05/24/2016 Elsevier Interactive Patient Education  2017 [employer] Hypertension Hypertension, commonly called high blood pressure, is when the force of blood pumping through the arteries is too strong. The arteries are the blood vessels that carry blood from the heart throughout the body. Hypertension forces the heart to work harder to pump blood and may cause arteries to become narrow or stiff. Having untreated or uncontrolled hypertension can cause heart attacks, strokes, kidney disease, and other problems. A blood pressure reading consists of a higher number over a lower number. Ideally, your blood pressure should be below 120/80.  The first ("top") number is called the systolic pressure. It is a measure of the pressure in your arteries as your heart beats. The second ("bottom") number is called the diastolic pressure. It is a measure of the pressure in your arteries as the heart relaxes. What are the causes? The cause of this condition is not known. What increases the risk? Some risk factors for high blood pressure are under your control. Others are not. Factors you can change   Smoking.  Having type 2 diabetes mellitus, high cholesterol, or both.  Not getting enough exercise or physical activity.  Being overweight.  Having too much fat, sugar, calories, or salt (sodium) in your diet.  Drinking too much alcohol. Factors that are difficult or impossible to change   Having chronic kidney disease.  Having a family history of high blood pressure.  Age. Risk increases with age.  Race. You may be at  higher risk if you are African-American.  Gender. Men are at higher risk than women before age 56. After age 56, women are at higher risk than men.  Having obstructive sleep apnea.  Stress. What are the signs or symptoms? Extremely high blood pressure (hypertensive crisis) may cause:  Headache.  Anxiety.  Shortness of breath.  Nosebleed.  Nausea and vomiting.  Severe chest pain.  Jerky movements you cannot control (seizures). How is this diagnosed? This condition is diagnosed by measuring your blood pressure while you are seated, with your arm resting on a surface. The cuff of the blood pressure monitor will be placed directly against the skin of your upper arm at the level of your heart. It should be measured at least twice using the same arm. Certain conditions can cause a difference in blood pressure between your right and left arms. Certain factors can cause blood pressure readings to be lower or higher than normal (elevated) for a short period of time:  When your blood pressure is higher when you are in a health care provider's office than when you are at home, this is called white coat hypertension. Most people with this condition do not need medicines.  When your blood pressure is higher at home than when you are in a health care provider's office, this is called masked hypertension. Most people with this condition may need medicines to control blood pressure. If you have a high blood pressure reading during one visit or you have normal blood pressure with other risk factors:  You may be asked to return on a different day to have your blood pressure checked again.  You may be asked to monitor your blood pressure at home for 1 week or longer. If you are diagnosed with hypertension, you may have other blood or imaging tests to help your health care provider understand your overall risk for other conditions. How is this treated? This condition is treated by making healthy  lifestyle changes, such as eating healthy foods, exercising more, and reducing your alcohol intake. Your health care provider may prescribe medicine if lifestyle changes are not enough to get your blood pressure under control, and if:  Your systolic blood pressure is above 130.  Your diastolic blood pressure is above 80. Your personal target blood pressure may vary depending on your medical conditions, your age, and other factors. Follow these instructions at home: Eating and drinking   Eat a diet that is high in fiber and potassium, and low in sodium, added sugar, and fat. An example eating plan is called the DASH (  Dietary Approaches to Stop Hypertension) diet. To eat this way:  Eat plenty of fresh fruits and vegetables. Try to fill half of your plate at each meal with fruits and vegetables.  Eat whole grains, such as whole wheat pasta, brown rice, or whole grain bread. Fill about one quarter of your plate with whole grains.  Eat or drink low-fat dairy products, such as skim milk or low-fat yogurt.  Avoid fatty cuts of meat, processed or cured meats, and poultry with skin. Fill about one quarter of your plate with lean proteins, such as fish, chicken without skin, beans, eggs, and tofu.  Avoid premade and processed foods. These tend to be higher in sodium, added sugar, and fat.  Reduce your daily sodium intake. Most people with hypertension should eat less than 1,500 mg of sodium a day.  Limit alcohol intake to no more than 1 drink a day for nonpregnant women and 2 drinks a day for men. One drink equals 12 oz of beer, 5 oz of wine, or 1 oz of hard liquor. Lifestyle   Work with your health care provider to maintain a healthy body weight or to lose weight. Ask what an ideal weight is for you.  Get at least 30 minutes of exercise that causes your heart to beat faster (aerobic exercise) most days of the week. Activities may include walking, swimming, or biking.  Include exercise to  strengthen your muscles (resistance exercise), such as pilates or lifting weights, as part of your weekly exercise routine. Try to do these types of exercises for 30 minutes at least 3 days a week.  Do not use any products that contain nicotine or tobacco, such as cigarettes and e-cigarettes. If you need help quitting, ask your health care provider.  Monitor your blood pressure at home as told by your health care provider.  Keep all follow-up visits as told by your health care provider. This is important. Medicines   Take over-the-counter and prescription medicines only as told by your health care provider. Follow directions carefully. Blood pressure medicines must be taken as prescribed.  Do not skip doses of blood pressure medicine. Doing this puts you at risk for problems and can make the medicine less effective.  Ask your health care provider about side effects or reactions to medicines that you should watch for. Contact a health care provider if:  You think you are having a reaction to a medicine you are taking.  You have headaches that keep coming back (recurring).  You feel dizzy.  You have swelling in your ankles.  You have trouble with your vision. Get help right away if:  You develop a severe headache or confusion.  You have unusual weakness or numbness.  You feel faint.  You have severe pain in your chest or abdomen.  You vomit repeatedly.  You have trouble breathing. Summary  Hypertension is when the force of blood pumping through your arteries is too strong. If this condition is not controlled, it may put you at risk for serious complications.  Your personal target blood pressure may vary depending on your medical conditions, your age, and other factors. For most people, a normal blood pressure is less than 120/80.  Hypertension is treated with lifestyle changes, medicines, or a combination of both. Lifestyle changes include weight loss, eating a healthy,  low-sodium diet, exercising more, and limiting alcohol. This information is not intended to replace advice given to you by your health care provider. Make sure you discuss any  questions you have with your health care provider. Document Released: 05/31/2005 Document Revised: 04/28/2016 Document Reviewed: 04/28/2016 Elsevier Interactive Patient Education  2017 [employer].
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
## 2016-08-16 ENCOUNTER — Telehealth: Payer: Self-pay | Admitting: *Deleted

## 2016-08-16 NOTE — Telephone Encounter (Signed)
Patient is aware of prostate and cholesterol being normal. Patient aware of no treatment being needed at this time. Patient advised to contact the office if urinary concerns persist. Patient will be started on a medication which will reduce the size and intern allow for a better flow. Medical Assistant left message on patient's home and cell voicemail. Voicemail states to give a call back to [place] with [facility] at [phone].

## 2016-08-16 NOTE — Telephone Encounter (Signed)
-----   Message from Quentin Angstlugbemiga E Jegede, MD sent at 08/12/2016 10:47 AM EST ----- Please inform patient that his lab results are all normal including the prostate enzyme and cholesterol level. No change in treatment. If frequent urination and dribbling persists, we will try medication that helps to reduce prostate size to allow for better flow and of urine aids complete voiding.

## 2016-10-27 ENCOUNTER — Ambulatory Visit: Payer: Medicaid Other | Attending: Internal Medicine | Admitting: Internal Medicine

## 2016-10-27 ENCOUNTER — Encounter: Payer: Self-pay | Admitting: Internal Medicine

## 2016-10-27 VITALS — BP 124/81 | HR 65 | Temp 98.6°F | Resp 18 | Ht 68.0 in | Wt 227.0 lb

## 2016-10-27 DIAGNOSIS — M25562 Pain in left knee: Secondary | ICD-10-CM | POA: Diagnosis not present

## 2016-10-27 DIAGNOSIS — I10 Essential (primary) hypertension: Secondary | ICD-10-CM

## 2016-10-27 DIAGNOSIS — N521 Erectile dysfunction due to diseases classified elsewhere: Secondary | ICD-10-CM

## 2016-10-27 DIAGNOSIS — G8929 Other chronic pain: Secondary | ICD-10-CM | POA: Diagnosis not present

## 2016-10-27 DIAGNOSIS — N281 Cyst of kidney, acquired: Secondary | ICD-10-CM | POA: Diagnosis not present

## 2016-10-27 DIAGNOSIS — M4802 Spinal stenosis, cervical region: Secondary | ICD-10-CM

## 2016-10-27 DIAGNOSIS — M25561 Pain in right knee: Secondary | ICD-10-CM

## 2016-10-27 DIAGNOSIS — I739 Peripheral vascular disease, unspecified: Secondary | ICD-10-CM | POA: Diagnosis not present

## 2016-10-27 MED ORDER — MELOXICAM 7.5 MG PO TABS: 7.5000 mg | ORAL_TABLET | Freq: Two times a day (BID) | ORAL | 2 refills | Status: DC | PRN

## 2016-10-27 MED ORDER — METOPROLOL TARTRATE 50 MG PO TABS: 50.0000 mg | ORAL_TABLET | Freq: Two times a day (BID) | ORAL | 3 refills | Status: DC

## 2016-10-27 MED ORDER — ROSUVASTATIN CALCIUM 40 MG PO TABS: 40.0000 mg | ORAL_TABLET | Freq: Every day | ORAL | 3 refills | Status: DC

## 2016-10-27 MED ORDER — LISINOPRIL 20 MG PO TABS: 20.0000 mg | ORAL_TABLET | Freq: Every day | ORAL | 3 refills | Status: DC

## 2016-10-27 MED ORDER — METHOCARBAMOL 500 MG PO TABS: 500.0000 mg | ORAL_TABLET | Freq: Four times a day (QID) | ORAL | 3 refills | Status: DC | PRN

## 2016-10-27 MED ORDER — SILDENAFIL CITRATE 25 MG PO TABS: 25.0000 mg | ORAL_TABLET | Freq: Every day | ORAL | 0 refills | Status: DC | PRN

## 2016-10-27 NOTE — Progress Notes (Signed)
Patient is here for HTN  Patient complains of leg pain being present and scaled at a 5.  Patient has taken medication today. Patient has eaten today.

## 2016-10-27 NOTE — Progress Notes (Signed)
Michael [name], is a 56 y.o. male  ONG:[id number]  GMW:[id number]  DOB - September 01, 1960  Chief Complaint  Patient presents with  . Hypertension       Subjective:   Khadeem Rockett is a 56 y.o. male with history of CAD, HTN here today for a follow up visit and medication refills. Patient presents today with continued pain in his left lower back that radiates to his left lower leg. He also has been having pain in his left calf when he walks for months now. He described the calf pain as pulling and cramping. The pain is associated with numbness and tingling down to his toes. He rates the pain a 7/10. He said when he walk to his mail box he has to sit and rest due to the pain in his calf, the pain is relieved with rest. Patient also said he was told of a kidney mass/cyst that needs to be followed up with imaging studies. No urinary symptom at this time. Patient is requesting refill of Sildenafil for ED. He also requests refill of Robaxin for muscle spasm from his neck and knee pains. Patient has No headache, No chest pain, No abdominal pain - No Nausea, No new weakness tingling or numbness, No Cough - SOB.  Problem  Erectile Dysfunction Due to Diseases Classified Elsewhere   ALLERGIES: Allergies  Allergen Reactions  . Ivp Dye [Iodinated Diagnostic Agents] Swelling    PAST MEDICAL HISTORY: Past Medical History:  Diagnosis Date  . Alcohol abuse   . Anterior myocardial infarction [facility])    ST-elevation; S/P emergent  drug-eluting stenting of proximal left anterior descending  . Coronary artery disease    Cath October 2012-moderate disease in RCA, Circumflex  . Hypertension   . Renal insufficiency     MEDICATIONS AT HOME: Prior to Admission medications   Medication Sig Start Date End Date Taking? Authorizing Provider  acetaminophen (TYLENOL) 500 MG chewable tablet Chew 500 mg by mouth every 6 (six) hours as needed for pain.   Yes [provider]  aspirin EC 81 MG tablet Take  81 mg by mouth daily.     Yes [provider]  lisinopril (PRINIVIL,ZESTRIL) 20 MG tablet Take 1 tablet (20 mg total) by mouth daily. 10/27/16  Yes Quentin Angst, MD  meloxicam (MOBIC) 7.5 MG tablet Take 1 tablet (7.5 mg total) by mouth 2 (two) times daily as needed for pain. 10/27/16  Yes Quentin Angst, MD  methocarbamol (ROBAXIN) 500 MG tablet Take 1 tablet (500 mg total) by mouth every 6 (six) hours as needed for muscle spasms. 10/27/16  Yes Quentin Angst, MD  metoprolol tartrate (LOPRESSOR) 50 MG tablet Take 1 tablet (50 mg total) by mouth 2 (two) times daily. 10/27/16  Yes Quentin Angst, MD  nitroGLYCERIN (NITROSTAT) 0.4 MG SL tablet Place 1 tablet (0.4 mg total) under the tongue every 5 (five) minutes as needed for chest pain. 05/05/15  Yes Dyann Kief, PA-C  rosuvastatin (CRESTOR) 40 MG tablet Take 1 tablet (40 mg total) by mouth daily. 10/27/16   Quentin Angst, MD  sildenafil (VIAGRA) 25 MG tablet Take 1 tablet (25 mg total) by mouth daily as needed for erectile dysfunction. 10/27/16   Quentin Angst, MD    Objective:   Vitals:   10/27/16 1453  BP: 124/81  Pulse: 65  Resp: 18  Temp: 98.6 F (37 C)  TempSrc: Oral  SpO2: 98%  Weight: 227 lb (103 kg)  Height: 5'  8" (1.727 m)   Exam General appearance : Awake, alert, not in any distress. Speech Clear. Not toxic looking, obese, walks with a cane HEENT: Atraumatic and Normocephalic, pupils equally reactive to light and accomodation Neck: Supple, no JVD. No cervical lymphadenopathy.  Chest: Good air entry bilaterally, no added sounds  CVS: S1 S2 regular, no murmurs.  Abdomen: Bowel sounds present, Non tender and not distended with no gaurding, rigidity or rebound. Extremities: B/L Lower Ext shows no edema, both legs are warm to touch Neurology: Awake alert, and oriented X 3, CN II-XII intact, Non focal Skin: No Rash  Data Review Lab Results  Component Value Date   HGBA1C 6.0  04/01/2016   HGBA1C 5.90 09/03/2014   HGBA1C 5.6 05/18/2013    Assessment & Plan   1. Essential hypertension  - metoprolol tartrate (LOPRESSOR) 50 MG tablet; Take 1 tablet (50 mg total) by mouth 2 (two) times daily.  Dispense: 180 tablet; Refill: 3 - lisinopril (PRINIVIL,ZESTRIL) 20 MG tablet; Take 1 tablet (20 mg total) by mouth daily.  Dispense: 90 tablet; Refill: 3  2. PAD (peripheral artery disease) (HCC)  - VAS [place] LE ART SEG MULTI (Segm&LE Reynauds); Future  3. Erectile dysfunction due to diseases classified elsewhere  - sildenafil (VIAGRA) 25 MG tablet; Take 1 tablet (25 mg total) by mouth daily as needed for erectile dysfunction.  Dispense: 10 tablet; Refill: 0  4. Chronic pain of both knees  - methocarbamol (ROBAXIN) 500 MG tablet; Take 1 tablet (500 mg total) by mouth every 6 (six) hours as needed for muscle spasms.  Dispense: 60 tablet; Refill: 3  5. Cervical stenosis of spine  - methocarbamol (ROBAXIN) 500 MG tablet; Take 1 tablet (500 mg total) by mouth every 6 (six) hours as needed for muscle spasms.  Dispense: 60 tablet; Refill: 3  6. Renal cyst  - US Renal; Future  Patient have been counseled extensively about nutrition and exercise. Other issues discussed during this visit include: low cholesterol diet, weight control and daily exercise, importance of adherence with medications and regular follow-up. We also discussed long term complications of uncontrolled diabetes and hypertension.   Return in about 6 months (around 04/29/2017) for Follow up HTN, Follow up Pain and comorbidities.  The patient was given clear instructions to go to ER or return to medical center if symptoms don't improve, worsen or new problems develop. The patient verbalized understanding. The patient was told to call to get lab results if they haven't heard anything in the next week.   This note has been created with Education officer, environmentalDragon speech recognition software and smart phrase technology. Any  transcriptional errors are unintentional.    Jeanann LewandowskyJEGEDE, Aryeh Butterfield, MD, MHA, FACP, FAAP, CPE [facility] [place] , [place] [phone]   10/27/2016, 3:50 PM

## 2016-10-27 NOTE — Patient Instructions (Addendum)
Peripheral Vascular Disease Peripheral vascular disease (PVD) is a disease of the blood vessels that are not part of your heart and brain. A simple term for PVD is poor circulation. In most cases, PVD narrows the blood vessels that carry blood from your heart to the rest of your body. This can result in a decreased supply of blood to your arms, legs, and internal organs, like your stomach or kidneys. However, it most often affects a person's lower legs and feet. There are two types of PVD.  Organic PVD. This is the more common type. It is caused by damage to the structure of blood vessels.  Functional PVD. This is caused by conditions that make blood vessels contract and tighten (spasm). Without treatment, PVD tends to get worse over time. PVD can also lead to acute ischemic limb. This is when an arm or limb suddenly has trouble getting enough blood. This is a medical emergency. Follow these instructions at home:  Take medicines only as told by your doctor.  Do not use any tobacco products, including cigarettes, chewing tobacco, or electronic cigarettes. If you need help quitting, ask your doctor.  Lose weight if you are overweight, and maintain a healthy weight as told by your doctor.  Eat a diet that is low in fat and cholesterol. If you need help, ask your doctor.  Exercise regularly. Ask your doctor for some good activities for you.  Take good care of your feet.  Wear comfortable shoes that fit well.  Check your feet often for any cuts or sores. Contact a doctor if:  You have cramps in your legs while walking.  You have leg pain when you are at rest.  You have coldness in a leg or foot.  Your skin changes.  You are unable to get or have an erection (erectile dysfunction).  You have cuts or sores on your feet that are not healing. Get help right away if:  Your arm or leg turns cold and blue.  Your arms or legs become red, warm, swollen, painful, or numb.  You have  chest pain or trouble breathing.  You suddenly have weakness in your face, arm, or leg.  You become very confused or you cannot speak.  You suddenly have a very bad headache.  You suddenly cannot see. This information is not intended to replace advice given to you by your health care provider. Make sure you discuss any questions you have with your health care provider. Document Released: 08/25/2009 Document Revised: 11/06/2015 Document Reviewed: 11/08/2013 Elsevier Interactive Patient Education  2017 Elsevier Inc.  Erectile Dysfunction Erectile dysfunction (ED) is the inability to get or keep an erection in order to have sexual intercourse. Erectile dysfunction may include:  Inability to get an erection.  Lack of enough hardness of the erection to allow penetration.  Loss of the erection before sex is finished. What are the causes? This condition may be caused by:  Certain medicines, such as:  Pain relievers.  Antihistamines.  Antidepressants.  Blood pressure medicines.  Water pills (diuretics).  Ulcer medicines.  Muscle relaxants.  Drugs.  Excessive drinking.  Psychological causes, such as:  Anxiety.  Depression.  Sadness.  Exhaustion.  Performance fear.  Stress.  Physical causes, such as:  Artery problems. This may include diabetes, smoking, liver disease, or atherosclerosis.  High blood pressure.  Hormonal problems, such as low testosterone.  Obesity.  Nerve problems. This may include back or pelvic injuries, diabetes mellitus, multiple sclerosis, or Parkinson disease. What  are the signs or symptoms? Symptoms of this condition include:  Inability to get an erection.  Lack of enough hardness of the erection to allow penetration.  Loss of the erection before sex is finished.  Normal erections at some times, but with frequent unsatisfactory episodes.  Low sexual satisfaction in either partner due to erection problems.  A curved penis  occurring with erection. The curve may cause pain or the penis may be too curved to allow for intercourse.  Never having nighttime erections. How is this diagnosed? This condition is often diagnosed by:  Performing a physical exam to find other diseases or specific problems with the penis.  Asking you detailed questions about the problem.  Performing blood tests to check for diabetes mellitus or to measure hormone levels.  Performing other tests to check for underlying health conditions.  Performing an ultrasound exam to check for scarring.  Performing a test to check blood flow to the penis.  Doing a sleep study at home to measure nighttime erections. How is this treated? This condition may be treated by:  Medicine taken by mouth to help you achieve an erection (oral medicine).  Hormone replacement therapy to replace low testosterone levels.  Medicine that is injected into the penis. Your health care provider may instruct you how to give yourself these injections at home.  Vacuum pump. This is a pump with a ring on it. The pump and ring are placed on the penis and used to create pressure that helps the penis become erect.  Penile implant surgery. In this procedure, you may receive:  An inflatable implant. This consists of cylinders, a pump, and a reservoir. The cylinders can be inflated with a fluid that helps to create an erection, and they can be deflated after intercourse.  A semi-rigid implant. This consists of two silicone rubber rods. The rods provide some rigidity. They are also flexible, so the penis can both curve downward in its normal position and become straight for sexual intercourse.  Blood vessel surgery, to improve blood flow to the penis. During this procedure, a blood vessel from a different part of the body is placed into the penis to allow blood to flow around (bypass) damaged or blocked blood vessels.  Lifestyle changes, such as exercising more, losing  weight, and quitting smoking. Follow these instructions at home: Medicines   Take over-the-counter and prescription medicines only as told by your health care provider. Do not increase the dosage without first discussing it with your health care provider.  If you are using self-injections, perform injections as directed by your health care provider. Make sure to avoid any veins that are on the surface of the penis. After giving an injection, apply pressure to the injection site for 5 minutes. General instructions   Exercise regularly, as directed by your health care provider. Work with your health care provider to lose weight, if needed.  Do not use any products that contain nicotine or tobacco, such as cigarettes and e-cigarettes. If you need help quitting, ask your health care provider.  Before using a vacuum pump, read the instructions that come with the pump and discuss any questions with your health care provider.  Keep all follow-up visits as told by your health care provider. This is important. Contact a health care provider if:  You feel nauseous.  You vomit. Get help right away if:  You are taking oral or injectable medicines and you have an erection that lasts longer than 4 hours. If  your health care provider is unavailable, go to the nearest emergency room for evaluation. An erection that lasts much longer than 4 hours can result in permanent damage to your penis.  You have severe pain in your groin or abdomen.  You develop redness or severe swelling of your penis.  You have redness spreading up into your groin or lower abdomen.  You are unable to urinate.  You experience chest pain or a rapid heart beat (palpitations) after taking oral medicines. Summary  Erectile dysfunction (ED) is the inability to get or keep an erection during sexual intercourse. This problem can usually be treated successfully.  This condition is diagnosed based on a physical exam, your  symptoms, and tests to determine the cause. Treatment varies depending on the cause, and may include medicines, hormone therapy, surgery, or vacuum pump.  You may need follow-up visits to make sure that you are using your medicines or devices correctly.  Get help right away if you are taking or injecting medicines and you have an erection that lasts longer than 4 hours. This information is not intended to replace advice given to you by your health care provider. Make sure you discuss any questions you have with your health care provider. Document Released: 05/28/2000 Document Revised: 06/16/2016 Document Reviewed: 06/16/2016 Elsevier Interactive Patient Education  2017 [employer].

## 2016-10-29 ENCOUNTER — Other Ambulatory Visit: Payer: Self-pay | Admitting: Internal Medicine

## 2016-10-29 ENCOUNTER — Ambulatory Visit (HOSPITAL_COMMUNITY)
Admission: RE | Admit: 2016-10-29 | Discharge: 2016-10-29 | Disposition: A | Discharge status: Home / Self Care | Payer: Medicare Other | Source: Ambulatory Visit | Attending: Internal Medicine | Admitting: Internal Medicine

## 2016-10-29 DIAGNOSIS — N281 Cyst of kidney, acquired: Secondary | ICD-10-CM | POA: Diagnosis not present

## 2016-11-01 ENCOUNTER — Telehealth: Payer: Self-pay | Admitting: *Deleted

## 2016-11-01 NOTE — Telephone Encounter (Signed)
-----   Message from Quentin Angstlugbemiga E Jegede, MD sent at 10/30/2016  6:44 PM EDT ----- Please inform patient the kidney ultrasound is unremarkable. Left renal cyst appears simple and stable

## 2016-11-01 NOTE — Telephone Encounter (Signed)
Patient verified DOB Patient is aware of kidney US being unremarkable and noting the left renal cyst to be stable and simple. Patient expressed his understanding and had no further questions.

## 2017-03-17 ENCOUNTER — Ambulatory Visit: Payer: Medicare Other | Admitting: Cardiovascular Disease

## 2017-03-17 NOTE — Progress Notes (Deleted)
No chief complaint on file.   History of Present Illness: 56 yo male with history of CAD, HTN here today for cardiac follow up. He was admitted to [facility] 03/31/11 with an anterior STEMI with occluded LAD. The LAD was treated with a drug eluting stent. Diffuse distal disease in the right posterolateral branch. The third OM was occluded and filled from left to left collaterals. LV function was preserved by echo.     He is here today for follow up. The patient denies any chest pain, dyspnea, palpitations, lower extremity edema, orthopnea, PND, dizziness, near syncope or syncope.    Primary Care Physician: Quentin Angst, MD   Past Medical History:  Diagnosis Date  . Alcohol abuse   . Anterior myocardial infarction [facility])    ST-elevation; S/P emergent  drug-eluting stenting of proximal left anterior descending  . Coronary artery disease    Cath October 2012-moderate disease in RCA, Circumflex  . Hypertension   . Renal insufficiency     Past Surgical History:  Procedure Laterality Date  . COLONOSCOPY N/A 08/16/2014   Procedure: COLONOSCOPY;  Surgeon: West Bali, MD;  [facility];  Service: Endoscopy;  Laterality: N/A;  10:15 AM  . DOPPLER ECHOCARDIOGRAPHY     Preserved left venticular function  . LEG SURGERY     left leg - has rod and 4 pins  . PATELLAR TENDON REPAIR Right 04/26/2014   Procedure: RIGHT PATELLA TENDON REPAIR;  Surgeon: Sheral Apley, MD;  Location: [facility];  Service: Orthopedics;  Laterality: Right;    Allergies  Allergen Reactions  . Ivp Dye [Iodinated Diagnostic Agents] Swelling    Social History   Social History  . Marital status: Significant Other    Spouse name: N/A  . Number of children: 0  . Years of education: N/A   Occupational History  . Not on file.   Social History Main Topics  . Smoking status: Never Smoker  . Smokeless tobacco: Never Used  . Alcohol use Yes     Comment: Drinks occassionally  .  Drug use: No  . Sexual activity: Not on file   Other Topics Concern  . Not on file   Social History Narrative  . No narrative on file    Family History  Problem Relation Age of Onset  . Cancer Maternal Aunt   . Hyperlipidemia Unknown   . Diabetes Unknown   . Heart disease Unknown    Meds:  Current Outpatient Prescriptions on File Prior to Visit  Medication Sig Dispense Refill  . acetaminophen (TYLENOL) 500 MG chewable tablet Chew 500 mg by mouth every 6 (six) hours as needed for pain.    Marland Kitchen aspirin EC 81 MG tablet Take 81 mg by mouth daily.      Marland Kitchen lisinopril (PRINIVIL,ZESTRIL) 20 MG tablet Take 1 tablet (20 mg total) by mouth daily. 90 tablet 3  . meloxicam (MOBIC) 7.5 MG tablet Take 1 tablet (7.5 mg total) by mouth 2 (two) times daily as needed for pain. 30 tablet 2  . methocarbamol (ROBAXIN) 500 MG tablet Take 1 tablet (500 mg total) by mouth every 6 (six) hours as needed for muscle spasms. 60 tablet 3  . metoprolol tartrate (LOPRESSOR) 50 MG tablet Take 1 tablet (50 mg total) by mouth 2 (two) times daily. 180 tablet 3  . nitroGLYCERIN (NITROSTAT) 0.4 MG SL tablet Place 1 tablet (0.4 mg total) under the tongue every 5 (five) minutes as needed for chest  pain. 25 tablet 3  . rosuvastatin (CRESTOR) 40 MG tablet Take 1 tablet (40 mg total) by mouth daily. 90 tablet 3  . sildenafil (VIAGRA) 25 MG tablet Take 1 tablet (25 mg total) by mouth daily as needed for erectile dysfunction. 10 tablet 0   No current facility-administered medications on file prior to visit.    Review of Systems:  As stated in the HPI and otherwise negative.   There were no vitals taken for this visit.  Physical Examination:  General: Well developed, well nourished, NAD  HEENT: OP clear, mucus membranes moist  SKIN: warm, dry. No rashes. Neuro: No focal deficits  Musculoskeletal: Muscle strength 5/5 all ext  Psychiatric: Mood and affect normal  Neck: No JVD, no carotid bruits, no thyromegaly, no  lymphadenopathy.  Lungs:Clear bilaterally, no wheezes, rhonci, crackles Cardiovascular: Regular rate and rhythm. No murmurs, gallops or rubs. Abdomen:Soft. Bowel sounds present. Non-tender.  Extremities: No lower extremity edema. Pulses are 2 + in the bilateral DP/PT.  Cardiac cath 03/31/11: 1. Right coronary artery: The vessel is large in size and dominant.  There is a mild 20% disease in the mid segment with minor luminal  irregularities distally. The PDA is normal in size and free of  significant disease. First posterolateral branch is normal in size  with 40% proximal disease. The second posterolateral branch is  relatively large in size with 50% disease at the ostium followed by  a 70% lesion proximally and diffuse 80% disease distally. The RCA  gives collaterals to the left anterior descending artery as well as  OM-3.  2. Left main coronary artery: The vessel is normal in size without  significant disease.  3. Left anterior descending artery: The vessel is normal in size and  is occluded proximally a small septal branch. The distal LAD does  not have significant disease. The first diagonal branch is normal  to large in size and has 2 branches. The superior branch is  overall normal. The inferior branch has a 90% proximal disease.  4. Left circumflex artery: The vessel is normal in size and  nondominant. The first obtuse marginal is normal in size with a  mild proximal disease. The second OM is normal in size with no  significant disease. Third OM appears to be occluded with  collaterals supplied from the distal LAD and the right coronary  Artery.  EKG:  EKG is *** ordered today. The ekg ordered today demonstrates   Recent Labs: 04/01/2016: TSH 1.88 08/11/2016: ALT 29; BUN 19; Creat 1.16; Hemoglobin 14.4; Platelets 223; Potassium 4.4; Sodium 139   Lipid Panel    Component Value Date/Time   CHOL 133 08/11/2016 1030   TRIG 96 08/11/2016 1030   HDL 37 (L) 08/11/2016 1030    CHOLHDL 3.6 08/11/2016 1030   VLDL 19 08/11/2016 1030   LDLCALC 77 08/11/2016 1030   LDLDIRECT 164.1 05/04/2013 1107     Wt Readings from Last 3 Encounters:  10/27/16 227 lb (103 kg)  08/11/16 224 lb 12.8 oz (102 kg)  04/07/16 224 lb 12.8 oz (102 kg)     Other studies Reviewed: Additional studies/ records that were reviewed today include: . Review of the above records demonstrates:    Assessment and Plan:   1. CAD without angina: No chest pain suggestive of angina. Will continue ASA, beta blocker, statin and Ace-inh.   2. HTN: BP controlled. Continue beta blocker and Ace-inh  3. HLD: LDL near goal. Continue statin.   Current medicines  are reviewed at length with the patient today.  The patient does not have concerns regarding medicines.  The following changes have been made:  no change  Labs/ tests ordered today include:   No orders of the defined types were placed in this encounter.   Disposition:   FU with me in 12 months  Signed, Verne Carrow, MD 03/17/2017 6:56 AM    [facility] [address] [place], [place], [place]  [address] Phone: [phone]; Fax: [phone]

## 2017-03-23 ENCOUNTER — Encounter: Payer: Self-pay | Admitting: Cardiovascular Disease

## 2017-03-23 ENCOUNTER — Ambulatory Visit: Payer: Medicare Other | Attending: Internal Medicine | Admitting: Internal Medicine

## 2017-03-23 ENCOUNTER — Encounter: Payer: Self-pay | Admitting: Internal Medicine

## 2017-03-23 VITALS — BP 131/86 | HR 60 | Temp 97.5°F | Resp 16 | Wt 222.4 lb

## 2017-03-23 DIAGNOSIS — Z7982 Long term (current) use of aspirin: Secondary | ICD-10-CM | POA: Diagnosis not present

## 2017-03-23 DIAGNOSIS — E785 Hyperlipidemia, unspecified: Secondary | ICD-10-CM | POA: Insufficient documentation

## 2017-03-23 DIAGNOSIS — I252 Old myocardial infarction: Secondary | ICD-10-CM | POA: Insufficient documentation

## 2017-03-23 DIAGNOSIS — I739 Peripheral vascular disease, unspecified: Secondary | ICD-10-CM | POA: Diagnosis not present

## 2017-03-23 DIAGNOSIS — M25562 Pain in left knee: Secondary | ICD-10-CM | POA: Insufficient documentation

## 2017-03-23 DIAGNOSIS — M25561 Pain in right knee: Secondary | ICD-10-CM | POA: Diagnosis not present

## 2017-03-23 DIAGNOSIS — Z131 Encounter for screening for diabetes mellitus: Secondary | ICD-10-CM

## 2017-03-23 DIAGNOSIS — I2510 Atherosclerotic heart disease of native coronary artery without angina pectoris: Secondary | ICD-10-CM | POA: Insufficient documentation

## 2017-03-23 DIAGNOSIS — M4802 Spinal stenosis, cervical region: Secondary | ICD-10-CM | POA: Insufficient documentation

## 2017-03-23 DIAGNOSIS — G8929 Other chronic pain: Secondary | ICD-10-CM | POA: Insufficient documentation

## 2017-03-23 DIAGNOSIS — Z114 Encounter for screening for human immunodeficiency virus [HIV]: Secondary | ICD-10-CM

## 2017-03-23 DIAGNOSIS — M170 Bilateral primary osteoarthritis of knee: Secondary | ICD-10-CM | POA: Insufficient documentation

## 2017-03-23 DIAGNOSIS — I10 Essential (primary) hypertension: Secondary | ICD-10-CM

## 2017-03-23 DIAGNOSIS — F1010 Alcohol abuse, uncomplicated: Secondary | ICD-10-CM | POA: Insufficient documentation

## 2017-03-23 DIAGNOSIS — N289 Disorder of kidney and ureter, unspecified: Secondary | ICD-10-CM | POA: Insufficient documentation

## 2017-03-23 DIAGNOSIS — E119 Type 2 diabetes mellitus without complications: Secondary | ICD-10-CM | POA: Diagnosis not present

## 2017-03-23 DIAGNOSIS — M545 Low back pain: Secondary | ICD-10-CM | POA: Diagnosis not present

## 2017-03-23 DIAGNOSIS — Z0000 Encounter for general adult medical examination without abnormal findings: Secondary | ICD-10-CM

## 2017-03-23 MED ORDER — ROSUVASTATIN CALCIUM 40 MG PO TABS: 40.0000 mg | ORAL_TABLET | Freq: Every day | ORAL | 3 refills | Status: DC

## 2017-03-23 MED ORDER — LISINOPRIL 20 MG PO TABS: 20.0000 mg | ORAL_TABLET | Freq: Every day | ORAL | 3 refills | Status: DC

## 2017-03-23 MED ORDER — METOPROLOL TARTRATE 50 MG PO TABS: 50.0000 mg | ORAL_TABLET | Freq: Two times a day (BID) | ORAL | 3 refills | Status: DC

## 2017-03-23 MED ORDER — METHOCARBAMOL 500 MG PO TABS: 500.0000 mg | ORAL_TABLET | Freq: Four times a day (QID) | ORAL | 3 refills | Status: DC | PRN

## 2017-03-23 MED ORDER — ACETAMINOPHEN 500 MG PO TABS: 1000.0000 mg | ORAL_TABLET | Freq: Three times a day (TID) | ORAL | 3 refills | Status: DC | PRN

## 2017-03-23 NOTE — Progress Notes (Signed)
Subjective:  Patient ID: Michael [name], male    DOB: 27-Nov-1960  Age: 56 y.o. MRN: [id number]  CC: Back Pain   HPI Michael [name] is a 56 y/o [place] male, who presents in clinic for c/o lower back pain and left knee pain. Patient has medical hx of HTN, CAD, PVD, and HLD.   Back Pain: Reports increase lumbar pain for the last 2-3 weeks. Denies recent injury. He states the pain in his back is localized across the lower back, non-radiating. Denies paresthesia in lower extremities, loss of bowel or bladder. Denies dysuria, hematuria, flank tenderness, fever, or chills.  Knee Pain: Reports left knee pain for a "couple of months". Pain is worse in the morning and improves throughout the day after frequent movement/ambulation. Reports 2 falls in the last month, due to unsteady gait w/o the use of his cane; because "my knee gave out". Denies swelling, redness, or warmth.   Hypertension: Reports taking medications w/o difficulty. He does not check his blood pressure at home. Denies HA, changes in vision, chest pain, sob, dyspnea on exertion, palpitations, near syncope, peripheral edema or claudication. Patient is maintaining a heart healthy diet at home.   Past Medical History:  Diagnosis Date  . Alcohol abuse   . Anterior myocardial infarction [facility])    ST-elevation; S/P emergent  drug-eluting stenting of proximal left anterior descending  . Coronary artery disease    Cath October 2012-moderate disease in RCA, Circumflex  . Hypertension   . Renal insufficiency    Past Surgical History:  Procedure Laterality Date  . COLONOSCOPY N/A 08/16/2014   Procedure: COLONOSCOPY;  Surgeon: West Bali, MD;  [facility];  Service: Endoscopy;  Laterality: N/A;  10:15 AM  . DOPPLER ECHOCARDIOGRAPHY     Preserved left venticular function  . LEG SURGERY     left leg - has rod and 4 pins  . PATELLAR TENDON REPAIR Right 04/26/2014   Procedure: RIGHT PATELLA TENDON REPAIR;   Surgeon: Sheral Apley, MD;  Location: [facility];  Service: Orthopedics;  Laterality: Right;   Outpatient Medications Prior to Visit  Medication Sig Dispense Refill  . aspirin EC 81 MG tablet Take 81 mg by mouth daily.      . nitroGLYCERIN (NITROSTAT) 0.4 MG SL tablet Place 1 tablet (0.4 mg total) under the tongue every 5 (five) minutes as needed for chest pain. 25 tablet 3  . sildenafil (VIAGRA) 25 MG tablet Take 1 tablet (25 mg total) by mouth daily as needed for erectile dysfunction. (Patient not taking: Reported on 03/23/2017) 10 tablet 0  . acetaminophen (TYLENOL) 500 MG chewable tablet Chew 500 mg by mouth every 6 (six) hours as needed for pain.    Marland Kitchen lisinopril (PRINIVIL,ZESTRIL) 20 MG tablet Take 1 tablet (20 mg total) by mouth daily. 90 tablet 3  . meloxicam (MOBIC) 7.5 MG tablet Take 1 tablet (7.5 mg total) by mouth 2 (two) times daily as needed for pain. 30 tablet 2  . methocarbamol (ROBAXIN) 500 MG tablet Take 1 tablet (500 mg total) by mouth every 6 (six) hours as needed for muscle spasms. 60 tablet 3  . metoprolol tartrate (LOPRESSOR) 50 MG tablet Take 1 tablet (50 mg total) by mouth 2 (two) times daily. 180 tablet 3  . rosuvastatin (CRESTOR) 40 MG tablet Take 1 tablet (40 mg total) by mouth daily. 90 tablet 3   No facility-administered medications prior to visit.    Allergies  Allergen  Reactions  . Ivp Dye [Iodinated Diagnostic Agents] Swelling   ROS Review of Systems  Constitutional: Negative for activity change, appetite change, fatigue and unexpected weight change.  Respiratory: Negative for chest tightness and shortness of breath.   Cardiovascular: Negative for chest pain, palpitations and leg swelling.  Genitourinary: Negative for dysuria, flank pain, frequency, hematuria and urgency.  Neurological: Negative for dizziness, syncope, weakness, numbness and headaches.  All other systems reviewed and are negative.  Objective:  BP 131/86   Pulse 60    Temp (!) 97.5 F (36.4 C) (Oral)   Resp 16   Wt 222 lb 6.4 oz (100.9 kg)   SpO2 97%   BMI 33.82 kg/m   BP/Weight 03/23/2017 10/27/2016 08/11/2016  Systolic BP 131 124 137  Diastolic BP 86 81 69  Wt. (Lbs) 222.4 227 224.8  BMI 33.82 34.52 34.18   Physical Exam  Constitutional: He is oriented to person, place, and time. He appears well-developed and well-nourished. No distress.  HENT:  Head: Normocephalic and atraumatic.  Neck: Normal range of motion. Neck supple.  Cardiovascular: Normal rate, regular rhythm and intact distal pulses.   Pulmonary/Chest: Effort normal and breath sounds normal. No respiratory distress. He exhibits no tenderness.  Musculoskeletal: Normal range of motion. He exhibits tenderness (mid-lumbar and L knee). He exhibits no edema or deformity.  Neurological: He is alert and oriented to person, place, and time.  Skin: Skin is warm and dry.  Psychiatric: He has a normal mood and affect. His behavior is normal. Judgment and thought content normal.  Nursing note and vitals reviewed.  Assessment & Plan:   1. Essential hypertension, Controlled - Continue medication regimen.  - Instructed to maintain heart healthy diet. - lisinopril (PRINIVIL,ZESTRIL) 20 MG tablet; Take 1 tablet (20 mg total) by mouth daily.  Dispense: 90 tablet; Refill: 3 - metoprolol tartrate (LOPRESSOR) 50 MG tablet; Take 1 tablet (50 mg total) by mouth 2 (two) times daily.  Dispense: 180 tablet; Refill: 3  2. PAD (peripheral artery disease) (HCC), Controlled - Continue medication regimen. - Instructed to maintain heart healthy diet.  - rosuvastatin (CRESTOR) 40 MG tablet; Take 1 tablet (40 mg total) by mouth daily.  Dispense: 90 tablet; Refill: 3  3. Chronic pain of both knees, acute exacerbation  - Given Acetaminophen 1000 mg for osteoarthritis. - Instructed to continue other medications prescribed.  - Referral to Orthopedics. - methocarbamol (ROBAXIN) 500 MG tablet; Take 1 tablet (500 mg  total) by mouth every 6 (six) hours as needed for muscle spasms.  Dispense: 60 tablet; Refill: 3 - Ambulatory referral to Orthopedic Surgery - acetaminophen (ACETAMINOPHEN EXTRA STRENGTH) 500 MG tablet; Take 2 tablets (1,000 mg total) by mouth every 8 (eight) hours as needed for moderate pain.  Dispense: 60 tablet; Refill: 3  4. Cervical stenosis of spine, acute exacerbation - Continue medication regimen.  - methocarbamol (ROBAXIN) 500 MG tablet; Take 1 tablet (500 mg total) by mouth every 6 (six) hours as needed for muscle spasms.  Dispense: 60 tablet; Refill: 3  5. Healthcare maintenance - Hepatitis c antibody (reflex)  6. Encounter for screening for HIV - HIV antibody (with reflex)  Meds ordered this encounter  Medications  . lisinopril (PRINIVIL,ZESTRIL) 20 MG tablet    Sig: Take 1 tablet (20 mg total) by mouth daily.    Dispense:  90 tablet    Refill:  3  . methocarbamol (ROBAXIN) 500 MG tablet    Sig: Take 1 tablet (500 mg total) by mouth every  6 (six) hours as needed for muscle spasms.    Dispense:  60 tablet    Refill:  3  . metoprolol tartrate (LOPRESSOR) 50 MG tablet    Sig: Take 1 tablet (50 mg total) by mouth 2 (two) times daily.    Dispense:  180 tablet    Refill:  3  . rosuvastatin (CRESTOR) 40 MG tablet    Sig: Take 1 tablet (40 mg total) by mouth daily.    Dispense:  90 tablet    Refill:  3  . acetaminophen (ACETAMINOPHEN EXTRA STRENGTH) 500 MG tablet    Sig: Take 2 tablets (1,000 mg total) by mouth every 8 (eight) hours as needed for moderate pain.    Dispense:  60 tablet    Refill:  3    Follow-up: No Follow-up on file.   Krystle H. Dove   Evaluation and management procedures were performed by me with DNP Student in attendance, note written by DNP student under my supervision and collaboration. I have reviewed the note and I agree with the management and plan.   Jeanann Lewandowsky, MD, MHA, CPE, FACP, FAAP [facility]  [place], [place] [phone]   03/24/2017, 11:38 AM

## 2017-03-23 NOTE — Patient Instructions (Signed)
DASH Eating Plan DASH stands for "Dietary Approaches to Stop Hypertension." The DASH eating plan is a healthy eating plan that has been shown to reduce high blood pressure (hypertension). It may also reduce your risk for type 2 diabetes, heart disease, and stroke. The DASH eating plan may also help with weight loss. What are tips for following this plan? General guidelines  Avoid eating more than 2,300 mg (milligrams) of salt (sodium) a day. If you have hypertension, you may need to reduce your sodium intake to 1,500 mg a day.  Limit alcohol intake to no more than 1 drink a day for nonpregnant women and 2 drinks a day for men. One drink equals 12 oz of beer, 5 oz of wine, or 1 oz of hard liquor.  Work with your health care provider to maintain a healthy body weight or to lose weight. Ask what an ideal weight is for you.  Get at least 30 minutes of exercise that causes your heart to beat faster (aerobic exercise) most days of the week. Activities may include walking, swimming, or biking.  Work with your health care provider or diet and nutrition specialist (dietitian) to adjust your eating plan to your individual calorie needs. Reading food labels  Check food labels for the amount of sodium per serving. Choose foods with less than 5 percent of the Daily Value of sodium. Generally, foods with less than 300 mg of sodium per serving fit into this eating plan.  To find whole grains, look for the word "whole" as the first word in the ingredient list. Shopping  Buy products labeled as "low-sodium" or "no salt added."  Buy fresh foods. Avoid canned foods and premade or frozen meals. Cooking  Avoid adding salt when cooking. Use salt-free seasonings or herbs instead of table salt or sea salt. Check with your health care provider or pharmacist before using salt substitutes.  Do not fry foods. Cook foods using healthy methods such as baking, boiling, grilling, and broiling instead.  Cook with  heart-healthy oils, such as olive, canola, soybean, or sunflower oil. Meal planning   Eat a balanced diet that includes: ? 5 or more servings of fruits and vegetables each day. At each meal, try to fill half of your plate with fruits and vegetables. ? Up to 6-8 servings of whole grains each day. ? Less than 6 oz of lean meat, poultry, or fish each day. A 3-oz serving of meat is about the same size as a deck of cards. One egg equals 1 oz. ? 2 servings of low-fat dairy each day. ? A serving of nuts, seeds, or beans 5 times each week. ? Heart-healthy fats. Healthy fats called Omega-3 fatty acids are found in foods such as flaxseeds and coldwater fish, like sardines, salmon, and mackerel.  Limit how much you eat of the following: ? Canned or prepackaged foods. ? Food that is high in trans fat, such as fried foods. ? Food that is high in saturated fat, such as fatty meat. ? Sweets, desserts, sugary drinks, and other foods with added sugar. ? Full-fat dairy products.  Do not salt foods before eating.  Try to eat at least 2 vegetarian meals each week.  Eat more home-cooked food and less restaurant, buffet, and fast food.  When eating at a restaurant, ask that your food be prepared with less salt or no salt, if possible. What foods are recommended? The items listed may not be a complete list. Talk with your dietitian about what   dietary choices are best for you. Grains Whole-grain or whole-wheat bread. Whole-grain or whole-wheat pasta. Brown rice. Oatmeal. Quinoa. Bulgur. Whole-grain and low-sodium cereals. Pita bread. Low-fat, low-sodium crackers. Whole-wheat flour tortillas. Vegetables Fresh or frozen vegetables (raw, steamed, roasted, or grilled). Low-sodium or reduced-sodium tomato and vegetable juice. Low-sodium or reduced-sodium tomato sauce and tomato paste. Low-sodium or reduced-sodium canned vegetables. Fruits All fresh, dried, or frozen fruit. Canned fruit in natural juice (without  added sugar). Meat and other protein foods Skinless chicken or turkey. Ground chicken or turkey. Pork with fat trimmed off. Fish and seafood. Egg whites. Dried beans, peas, or lentils. Unsalted nuts, nut butters, and seeds. Unsalted canned beans. Lean cuts of beef with fat trimmed off. Low-sodium, lean deli meat. Dairy Low-fat (1%) or fat-free (skim) milk. Fat-free, low-fat, or reduced-fat cheeses. Nonfat, low-sodium ricotta or cottage cheese. Low-fat or nonfat yogurt. Low-fat, low-sodium cheese. Fats and oils Soft margarine without trans fats. Vegetable oil. Low-fat, reduced-fat, or light mayonnaise and salad dressings (reduced-sodium). Canola, safflower, olive, soybean, and sunflower oils. Avocado. Seasoning and other foods Herbs. Spices. Seasoning mixes without salt. Unsalted popcorn and pretzels. Fat-free sweets. What foods are not recommended? The items listed may not be a complete list. Talk with your dietitian about what dietary choices are best for you. Grains Baked goods made with fat, such as croissants, muffins, or some breads. Dry pasta or rice meal packs. Vegetables Creamed or fried vegetables. Vegetables in a cheese sauce. Regular canned vegetables (not low-sodium or reduced-sodium). Regular canned tomato sauce and paste (not low-sodium or reduced-sodium). Regular tomato and vegetable juice (not low-sodium or reduced-sodium). Pickles. Olives. Fruits Canned fruit in a light or heavy syrup. Fried fruit. Fruit in cream or butter sauce. Meat and other protein foods Fatty cuts of meat. Ribs. Fried meat. Bacon. Sausage. Bologna and other processed lunch meats. Salami. Fatback. Hotdogs. Bratwurst. Salted nuts and seeds. Canned beans with added salt. Canned or smoked fish. Whole eggs or egg yolks. Chicken or turkey with skin. Dairy Whole or 2% milk, cream, and half-and-half. Whole or full-fat cream cheese. Whole-fat or sweetened yogurt. Full-fat cheese. Nondairy creamers. Whipped toppings.  Processed cheese and cheese spreads. Fats and oils Butter. Stick margarine. Lard. Shortening. Ghee. Bacon fat. Tropical oils, such as coconut, palm kernel, or palm oil. Seasoning and other foods Salted popcorn and pretzels. Onion salt, garlic salt, seasoned salt, table salt, and sea salt. Worcestershire sauce. Tartar sauce. Barbecue sauce. Teriyaki sauce. Soy sauce, including reduced-sodium. Steak sauce. Canned and packaged gravies. Fish sauce. Oyster sauce. Cocktail sauce. Horseradish that you find on the shelf. Ketchup. Mustard. Meat flavorings and tenderizers. Bouillon cubes. Hot sauce and Tabasco sauce. Premade or packaged marinades. Premade or packaged taco seasonings. Relishes. Regular salad dressings. Where to find more information:  National Heart, Lung, and Blood Institute: www.nhlbi.nih.gov  American Heart Association: www.heart.org Summary  The DASH eating plan is a healthy eating plan that has been shown to reduce high blood pressure (hypertension). It may also reduce your risk for type 2 diabetes, heart disease, and stroke.  With the DASH eating plan, you should limit salt (sodium) intake to 2,300 mg a day. If you have hypertension, you may need to reduce your sodium intake to 1,500 mg a day.  When on the DASH eating plan, aim to eat more fresh fruits and vegetables, whole grains, lean proteins, low-fat dairy, and heart-healthy fats.  Work with your health care provider or diet and nutrition specialist (dietitian) to adjust your eating plan to your individual   calorie needs. This information is not intended to replace advice given to you by your health care provider. Make sure you discuss any questions you have with your health care provider. Document Released: 05/20/2011 Document Revised: 05/24/2016 Document Reviewed: 05/24/2016 Elsevier Interactive Patient Education  2017 Elsevier Inc. Hypertension Hypertension, commonly called high blood pressure, is when the force of blood  pumping through the arteries is too strong. The arteries are the blood vessels that carry blood from the heart throughout the body. Hypertension forces the heart to work harder to pump blood and may cause arteries to become narrow or stiff. Having untreated or uncontrolled hypertension can cause heart attacks, strokes, kidney disease, and other problems. A blood pressure reading consists of a higher number over a lower number. Ideally, your blood pressure should be below 120/80. The first ("top") number is called the systolic pressure. It is a measure of the pressure in your arteries as your heart beats. The second ("bottom") number is called the diastolic pressure. It is a measure of the pressure in your arteries as the heart relaxes. What are the causes? The cause of this condition is not known. What increases the risk? Some risk factors for high blood pressure are under your control. Others are not. Factors you can change  Smoking.  Having type 2 diabetes mellitus, high cholesterol, or both.  Not getting enough exercise or physical activity.  Being overweight.  Having too much fat, sugar, calories, or salt (sodium) in your diet.  Drinking too much alcohol. Factors that are difficult or impossible to change  Having chronic kidney disease.  Having a family history of high blood pressure.  Age. Risk increases with age.  Race. You may be at higher risk if you are African-American.  Gender. Men are at higher risk than women before age 45. After age 65, women are at higher risk than men.  Having obstructive sleep apnea.  Stress. What are the signs or symptoms? Extremely high blood pressure (hypertensive crisis) may cause:  Headache.  Anxiety.  Shortness of breath.  Nosebleed.  Nausea and vomiting.  Severe chest pain.  Jerky movements you cannot control (seizures).  How is this diagnosed? This condition is diagnosed by measuring your blood pressure while you are  seated, with your arm resting on a surface. The cuff of the blood pressure monitor will be placed directly against the skin of your upper arm at the level of your heart. It should be measured at least twice using the same arm. Certain conditions can cause a difference in blood pressure between your right and left arms. Certain factors can cause blood pressure readings to be lower or higher than normal (elevated) for a short period of time:  When your blood pressure is higher when you are in a health care provider's office than when you are at home, this is called white coat hypertension. Most people with this condition do not need medicines.  When your blood pressure is higher at home than when you are in a health care provider's office, this is called masked hypertension. Most people with this condition may need medicines to control blood pressure.  If you have a high blood pressure reading during one visit or you have normal blood pressure with other risk factors:  You may be asked to return on a different day to have your blood pressure checked again.  You may be asked to monitor your blood pressure at home for 1 week or longer.  If you are diagnosed   with hypertension, you may have other blood or imaging tests to help your health care provider understand your overall risk for other conditions. How is this treated? This condition is treated by making healthy lifestyle changes, such as eating healthy foods, exercising more, and reducing your alcohol intake. Your health care provider may prescribe medicine if lifestyle changes are not enough to get your blood pressure under control, and if:  Your systolic blood pressure is above 130.  Your diastolic blood pressure is above 80.  Your personal target blood pressure may vary depending on your medical conditions, your age, and other factors. Follow these instructions at home: Eating and drinking  Eat a diet that is high in fiber and potassium,  and low in sodium, added sugar, and fat. An example eating plan is called the DASH (Dietary Approaches to Stop Hypertension) diet. To eat this way: ? Eat plenty of fresh fruits and vegetables. Try to fill half of your plate at each meal with fruits and vegetables. ? Eat whole grains, such as whole wheat pasta, brown rice, or whole grain bread. Fill about one quarter of your plate with whole grains. ? Eat or drink low-fat dairy products, such as skim milk or low-fat yogurt. ? Avoid fatty cuts of meat, processed or cured meats, and poultry with skin. Fill about one quarter of your plate with lean proteins, such as fish, chicken without skin, beans, eggs, and tofu. ? Avoid premade and processed foods. These tend to be higher in sodium, added sugar, and fat.  Reduce your daily sodium intake. Most people with hypertension should eat less than 1,500 mg of sodium a day.  Limit alcohol intake to no more than 1 drink a day for nonpregnant women and 2 drinks a day for men. One drink equals 12 oz of beer, 5 oz of wine, or 1 oz of hard liquor. Lifestyle  Work with your health care provider to maintain a healthy body weight or to lose weight. Ask what an ideal weight is for you.  Get at least 30 minutes of exercise that causes your heart to beat faster (aerobic exercise) most days of the week. Activities may include walking, swimming, or biking.  Include exercise to strengthen your muscles (resistance exercise), such as pilates or lifting weights, as part of your weekly exercise routine. Try to do these types of exercises for 30 minutes at least 3 days a week.  Do not use any products that contain nicotine or tobacco, such as cigarettes and e-cigarettes. If you need help quitting, ask your health care provider.  Monitor your blood pressure at home as told by your health care provider.  Keep all follow-up visits as told by your health care provider. This is important. Medicines  Take over-the-counter and  prescription medicines only as told by your health care provider. Follow directions carefully. Blood pressure medicines must be taken as prescribed.  Do not skip doses of blood pressure medicine. Doing this puts you at risk for problems and can make the medicine less effective.  Ask your health care provider about side effects or reactions to medicines that you should watch for. Contact a health care provider if:  You think you are having a reaction to a medicine you are taking.  You have headaches that keep coming back (recurring).  You feel dizzy.  You have swelling in your ankles.  You have trouble with your vision. Get help right away if:  You develop a severe headache or confusion.  You   have unusual weakness or numbness.  You feel faint.  You have severe pain in your chest or abdomen.  You vomit repeatedly.  You have trouble breathing. Summary  Hypertension is when the force of blood pumping through your arteries is too strong. If this condition is not controlled, it may put you at risk for serious complications.  Your personal target blood pressure may vary depending on your medical conditions, your age, and other factors. For most people, a normal blood pressure is less than 120/80.  Hypertension is treated with lifestyle changes, medicines, or a combination of both. Lifestyle changes include weight loss, eating a healthy, low-sodium diet, exercising more, and limiting alcohol. This information is not intended to replace advice given to you by your health care provider. Make sure you discuss any questions you have with your health care provider. Document Released: 05/31/2005 Document Revised: 04/28/2016 Document Reviewed: 04/28/2016 Elsevier Interactive Patient Education  2018 Elsevier Inc.  

## 2017-03-24 LAB — HEPATITIS C ANTIBODY (REFLEX): HCV Ab: <0.1 s/co ratio (ref 0.0–0.9)

## 2017-03-24 LAB — HEMOGLOBIN A1C
ESTIMATED AVERAGE GLUCOSE: 137 mg/dL
HEMOGLOBIN A1C: 6.4 % — AB (ref 4.8–5.6)

## 2017-03-24 LAB — HIV ANTIBODY (ROUTINE TESTING W REFLEX): HIV Screen 4th Generation wRfx: NONREACTIVE

## 2017-03-24 LAB — HCV COMMENT:

## 2017-03-30 ENCOUNTER — Telehealth (INDEPENDENT_AMBULATORY_CARE_PROVIDER_SITE_OTHER): Payer: Self-pay | Admitting: Orthopaedic Surgery

## 2017-03-30 NOTE — Telephone Encounter (Signed)
Tried to call patient but no answer. LMOM to return call.

## 2017-03-30 NOTE — Telephone Encounter (Signed)
Patient asked if he can be referred to Dr August Saucerean.  The number to contact patient is [phone]

## 2017-03-30 NOTE — Telephone Encounter (Signed)
For what reason?  Dr. August Saucerean has the same practice as mine.

## 2017-03-30 NOTE — Telephone Encounter (Signed)
See message below °

## 2017-04-21 ENCOUNTER — Encounter: Payer: Self-pay | Admitting: Physician Assistant

## 2017-04-21 NOTE — Progress Notes (Deleted)
Cardiology Office Note    Date:  04/21/2017  ID:  Michael [name], DOB 03/14/1961, MRN [id number] PCP:  [name], Olugbemiga E, MD  Cardiologist:  Dr. [name]   Chief Complaint: overdue f/u CAD  History of Present Illness:  Michael [name] is a 56 y.o. male with history of CAD s/p DES to LAD in 2012, IV dye allergy, HTN, HLD, alcohol abuse, pre-diabetes, borderline normal renal function (1.2) who presents to clinic for follow up.   [facility] with anterior STEMI. There was diffuse distal disease in the right posterolateral branch. The third OM was occluded and filled from left to left collaterals. Proximal LAD occlusion treated with 2.75 x 24 mm Promus Element DES x 1. He had a severe dye reaction. LV function was preserved by echo - these records are unavailable in Epic but per discharge summary, "A 2D echocardiogram was done for assessment of LV function (LV-gram deferred, secondary to severe contrast reaction), indicating overall preserved LV function with hypokinesis of the inferolateral base, and with no significant valvular abnormalities." Last labs 07/2016 with K 4.4, Cr 1.16, LDL 77, Hgb 14.4; A1C was 6.4 in 03/2017.  Etoh? ?pad studies  CAD HTN Hyperlipidemia Pre-diabetes    Past Medical History:  Diagnosis Date  . Alcohol abuse   . Anterior myocardial infarction [facility](HCC)    ST-elevation; S/P emergent  drug-eluting stenting of proximal left anterior descending  . Coronary artery disease    a. STEMI 2012 - s/p DES to LAD with  diffuse distal disease in the right posterolateral branch. The third OM was occluded and filled from left to left collaterals. LVEF preserved.  . Hyperlipidemia   . Hypertension   . Pre-diabetes   . Renal insufficiency     Past Surgical History:  Procedure Laterality Date  . DOPPLER ECHOCARDIOGRAPHY     Preserved left venticular function  . LEG SURGERY     left leg - has rod and 4 pins    Current Medications: No  outpatient medications have been marked as taking for the 04/22/17 encounter (Appointment) with Laurann Montanaunn, Kolbey Teichert N, PA-C.     Allergies:   Ivp dye [iodinated diagnostic agents]   Social History   Socioeconomic History  . Marital status: Significant Other    Spouse name: Not on file  . Number of children: 0  . Years of education: Not on file  . Highest education level: Not on file  Social Needs  . Financial resource strain: Not on file  . Food insecurity - worry: Not on file  . Food insecurity - inability: Not on file  . Transportation needs - medical: Not on file  . Transportation needs - non-medical: Not on file  Occupational History  . Not on file  Tobacco Use  . Smoking status: Never Smoker  . Smokeless tobacco: Never Used  Substance and Sexual Activity  . Alcohol use: Yes    Comment: Drinks occassionally  . Drug use: No  . Sexual activity: Not on file  Other Topics Concern  . Not on file  Social History Narrative  . Not on file     Family History:  Family History  Problem Relation Age of Onset  . Cancer Maternal Aunt   . Hyperlipidemia Unknown   . Diabetes Unknown   . Heart disease Unknown    ***  ROS:   Please see the history of present illness. Otherwise, review of systems is positive for ***.  All other systems  are reviewed and otherwise negative.    PHYSICAL EXAM:   VS:  There were no vitals taken for this visit.  BMI: There is no height or weight on file to calculate BMI. GEN: Well nourished, well developed, in no acute distress  HEENT: normocephalic, atraumatic Neck: no JVD, carotid bruits, or masses Cardiac: ***RRR; no murmurs, rubs, or gallops, no edema  Respiratory:  clear to auscultation bilaterally, normal work of breathing GI: soft, nontender, nondistended, + BS MS: no deformity or atrophy  Skin: warm and dry, no rash Neuro:  Alert and Oriented x 3, Strength and sensation are intact, follows commands Psych: euthymic mood, full affect  Wt  Readings from Last 3 Encounters:  03/23/17 222 lb 6.4 oz (100.9 kg)  10/27/16 227 lb (103 kg)  08/11/16 224 lb 12.8 oz (102 kg)      Studies/Labs Reviewed:   EKG:  EKG was ordered today and personally reviewed by me and demonstrates *** EKG was not ordered today.***  Recent Labs: 08/11/2016: ALT 29; BUN 19; Creat 1.16; Hemoglobin 14.4; Platelets 223; Potassium 4.4; Sodium 139   Lipid Panel    Component Value Date/Time   CHOL 133 08/11/2016 1030   TRIG 96 08/11/2016 1030   HDL 37 (L) 08/11/2016 1030   CHOLHDL 3.6 08/11/2016 1030   VLDL 19 08/11/2016 1030   LDLCALC 77 08/11/2016 1030   LDLDIRECT 164.1 05/04/2013 1107    Additional studies/ records that were reviewed today include: Summarized above.***    ASSESSMENT & PLAN:   1. ***  Disposition: F/u with ***   Medication Adjustments/Labs and Tests Ordered: Current medicines are reviewed at length with the patient today.  Concerns regarding medicines are outlined above. Medication changes, Labs and Tests ordered today are summarized above and listed in the Patient Instructions accessible in Encounters.   Signed, Laurann Montanaayna N Jadd Gasior, PA-C  04/21/2017 12:56 PM    [facility] [address], [place], [place]  [address] Phone: [phone]; Fax: [phone]

## 2017-04-22 ENCOUNTER — Ambulatory Visit: Payer: Medicare Other | Admitting: Physician Assistant

## 2017-04-25 ENCOUNTER — Telehealth (INDEPENDENT_AMBULATORY_CARE_PROVIDER_SITE_OTHER): Payer: Self-pay | Admitting: *Deleted

## 2017-04-25 NOTE — Telephone Encounter (Signed)
Medical Assistant left message on patient's home and cell voicemail. Voicemail states to give [place] call back [place] [facility] at [phone]. Patient is aware of labs being normal and hep c being negative.

## 2017-04-25 NOTE — Telephone Encounter (Signed)
-----   Message from Quentin Angstlugbemiga E Jegede, MD sent at 04/08/2017  3:12 PM EDT ----- Normal Lab results. Hep C Negative

## 2017-05-12 NOTE — Progress Notes (Deleted)
Cardiology Office Note    Date:  05/12/2017  ID:  Michael [name], DOB 07/01/1960, MRN [id number] PCP:  [name], Olugbemiga E, MD  Cardiologist:  Dr. [name]   Chief Complaint: overdue follow-up  History of Present Illness:  Michael [name] is a 56 y.o. male with history of CAD s/p DES to LAD in 2012, IV dye allergy, HTN, HLD, alcohol abuse, pre-diabetes, borderline normal renal function (1.2) who presents to clinic for follow up.    He was [facility]/11 with anterior STEMI. There was diffuse distal disease in the right posterolateral branch. The third OM was occluded and filled from left to left collaterals. Proximal LAD occlusion treated with 2.75 x 24 mm Promus Element DES x 1. He had a severe dye reaction. LV function was preserved by echo - these records are unavailable in Epic but per discharge summary, "A 2D echocardiogram was done for assessment of LV function (LV-gram deferred, secondary to severe contrast reaction), indicating overall preserved LV function with hypokinesis of the inferolateral base, and with no significant valvular abnormalities." Last labs 07/2016 with K 4.4, Cr 1.16, LDL 77, Hgb 14.4; A1C was 6.4 in 03/2017. PCP notes indicate h/o PAD but no prior studies resulted. ***  Etoh?  ?pad studies   CAD  HTN  Hyperlipidemia  Pre-diabetes     Past Medical History:  Diagnosis Date  . Alcohol abuse   . Anterior myocardial infarction [facility](HCC)    ST-elevation; S/P emergent  drug-eluting stenting of proximal left anterior descending  . Coronary artery disease    a. STEMI 2012 - s/p DES to LAD with  diffuse distal disease in the right posterolateral branch. The third OM was occluded and filled from left to left collaterals. LVEF preserved.  . Hyperlipidemia   . Hypertension   . Pre-diabetes   . Renal insufficiency     Past Surgical History:  Procedure Laterality Date  . COLONOSCOPY N/A 08/16/2014   Procedure: COLONOSCOPY;  Surgeon: West BaliSandi L Fields,  MD;  [facility];  Service: Endoscopy;  Laterality: N/A;  10:15 AM  . DOPPLER ECHOCARDIOGRAPHY     Preserved left venticular function  . LEG SURGERY     left leg - has rod and 4 pins  . PATELLAR TENDON REPAIR Right 04/26/2014   Procedure: RIGHT PATELLA TENDON REPAIR;  Surgeon: Sheral Apleyimothy D Murphy, MD;  Location: [facility];  Service: Orthopedics;  Laterality: Right;    Current Medications: No outpatient medications have been marked as taking for the 05/13/17 encounter (Appointment) with Laurann Montanaunn, Dayna N, PA-C.     Allergies:   Ivp dye [iodinated diagnostic agents]   Social History   Socioeconomic History  . Marital status: Significant Other    Spouse name: Not on file  . Number of children: 0  . Years of education: Not on file  . Highest education level: Not on file  Social Needs  . Financial resource strain: Not on file  . Food insecurity - worry: Not on file  . Food insecurity - inability: Not on file  . Transportation needs - medical: Not on file  . Transportation needs - non-medical: Not on file  Occupational History  . Not on file  Tobacco Use  . Smoking status: Never Smoker  . Smokeless tobacco: Never Used  Substance and Sexual Activity  . Alcohol use: Yes    Comment: Drinks occassionally  . Drug use: No  . Sexual activity: Not on file  Other Topics  Concern  . Not on file  Social History Narrative  . Not on file     Family History:  Family History  Problem Relation Age of Onset  . Cancer Maternal Aunt   . Hyperlipidemia Unknown   . Diabetes Unknown   . Heart disease Unknown    ***  ROS:   Please see the history of present illness. Otherwise, review of systems is positive for ***.  All other systems are reviewed and otherwise negative.    PHYSICAL EXAM:   VS:  There were no vitals taken for this visit.  BMI: There is no height or weight on file to calculate BMI. GEN: Well nourished, well developed, in no acute distress  HEENT:  normocephalic, atraumatic Neck: no JVD, carotid bruits, or masses Cardiac: ***RRR; no murmurs, rubs, or gallops, no edema  Respiratory:  clear to auscultation bilaterally, normal work of breathing GI: soft, nontender, nondistended, + BS MS: no deformity or atrophy  Skin: warm and dry, no rash Neuro:  Alert and Oriented x 3, Strength and sensation are intact, follows commands Psych: euthymic mood, full affect  Wt Readings from Last 3 Encounters:  03/23/17 222 lb 6.4 oz (100.9 kg)  10/27/16 227 lb (103 kg)  08/11/16 224 lb 12.8 oz (102 kg)      Studies/Labs Reviewed:   EKG:  EKG was ordered today and personally reviewed by me and demonstrates *** EKG was not ordered today.***  Recent Labs: 08/11/2016: ALT 29; BUN 19; Creat 1.16; Hemoglobin 14.4; Platelets 223; Potassium 4.4; Sodium 139   Lipid Panel    Component Value Date/Time   CHOL 133 08/11/2016 1030   TRIG 96 08/11/2016 1030   HDL 37 (L) 08/11/2016 1030   CHOLHDL 3.6 08/11/2016 1030   VLDL 19 08/11/2016 1030   LDLCALC 77 08/11/2016 1030   LDLDIRECT 164.1 05/04/2013 1107    Additional studies/ records that were reviewed today include: Summarized above.***    ASSESSMENT & PLAN:   1. ***  Disposition: F/u with ***   Medication Adjustments/Labs and Tests Ordered: Current medicines are reviewed at length with the patient today.  Concerns regarding medicines are outlined above. Medication changes, Labs and Tests ordered today are summarized above and listed in the Patient Instructions accessible in Encounters.   Signed, Laurann Montanaayna N Dunn, PA-C  05/12/2017 11:25 AM    [facility] [address], [place], [place]  [address] Phone: [phone]; Fax: [phone]

## 2017-05-13 ENCOUNTER — Ambulatory Visit: Payer: Medicare Other | Admitting: Physician Assistant

## 2017-06-23 ENCOUNTER — Other Ambulatory Visit: Payer: Self-pay

## 2017-06-23 ENCOUNTER — Ambulatory Visit: Payer: Self-pay

## 2017-06-23 ENCOUNTER — Encounter (HOSPITAL_COMMUNITY): Payer: Self-pay | Admitting: Emergency Medicine

## 2017-06-23 ENCOUNTER — Ambulatory Visit (HOSPITAL_COMMUNITY)
Admission: EM | Admit: 2017-06-23 | Discharge: 2017-06-23 | Disposition: A | Discharge status: Home / Self Care | Payer: Medicare Other | Attending: Family Medicine | Admitting: Family Medicine

## 2017-06-23 DIAGNOSIS — R109 Unspecified abdominal pain: Secondary | ICD-10-CM | POA: Diagnosis not present

## 2017-06-23 NOTE — ED Triage Notes (Signed)
Pain on left epigastric area and lower ribcage.  Pain is worse with movement, but eases after moving for a while.  No fall.  No nausea, no vomiting, no diarrhea.
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
## 2017-06-23 NOTE — Discharge Instructions (Signed)
Please return here or to the Emergency Department immediately should you feel worse in any way or have any of the following symptoms: increasing or different abdominal pain, persistent vomiting, fevers, or shaking chills. ° ° °

## 2017-06-27 NOTE — Progress Notes (Signed)
Cardiology Office Note    Date:  06/28/2017   ID:  Michael EhlersGary S [name], DOB 04/25/1961, MRN [id number]  PCP:  Michael [name], Michael E, MD  Cardiologist: Michael Carrowhristopher McAlhany, MD  Chief Complaint  Patient presents with  . Follow-up    History of Present Illness:  Michael [name] is a 57 y.o. male with history of CAD status post STEMI treated with DES to the proximal LAD 03/31/11.  He had diffuse distal disease in the right PLA and the third OM was occluded and filled from left to left collaterals.  He had severe dye reaction.  LV function was preserved by echo.  Also has hypertension.  Last saw Dr. Clifton [name] 10/2015 and doing well.  Patient comes in today for follow up. Was in ED 06/23/17 with epigastric pain that eased before he got there after taking a laxative had a bowel movement.  He said the pain was there for a couple days and he got concerned.  He can walk about 100 yards with a cane because of his arthritis.  He denies any chest tightness, pressure, dyspnea, dyspnea on exertion, dizziness or presyncope.  He has lost 12 pounds with diet and says he is going to start exercising more.  Sometimes he cheats on his diet and notices blood pressure goes up.    Past Medical History:  Diagnosis Date  . Alcohol abuse   . Anterior myocardial infarction [facility](HCC)    ST-elevation; S/P emergent  drug-eluting stenting of proximal left anterior descending  . Coronary artery disease    a. STEMI 2012 - s/p DES to LAD with  diffuse distal disease in the right posterolateral branch. The third OM was occluded and filled from left to left collaterals. LVEF preserved.  . Hyperlipidemia   . Hypertension   . Pre-diabetes   . Renal insufficiency     Past Surgical History:  Procedure Laterality Date  . COLONOSCOPY N/A 08/16/2014   Procedure: COLONOSCOPY;  Surgeon: West BaliSandi L Fields, MD;  [facility];  Service: Endoscopy;  Laterality: N/A;  10:15 AM  . DOPPLER ECHOCARDIOGRAPHY     Preserved left  venticular function  . LEG SURGERY     left leg - has rod and 4 pins  . PATELLAR TENDON REPAIR Right 04/26/2014   Procedure: RIGHT PATELLA TENDON REPAIR;  Surgeon: Sheral Apleyimothy D Murphy, MD;  Location[facility];  Service: Orthopedics;  Laterality: Right;    Current Medications: Current Meds  Medication Sig  . acetaminophen (ACETAMINOPHEN EXTRA STRENGTH) 500 MG tablet Take 2 tablets (1,000 mg total) by mouth every 8 (eight) hours as needed for moderate pain.  Marland Kitchen. aspirin EC 81 MG tablet Take 81 mg by mouth daily.    Marland Kitchen. lisinopril (PRINIVIL,ZESTRIL) 20 MG tablet Take 1 tablet (20 mg total) by mouth daily.  . methocarbamol (ROBAXIN) 500 MG tablet Take 1 tablet (500 mg total) by mouth every 6 (six) hours as needed for muscle spasms.  . metoprolol tartrate (LOPRESSOR) 50 MG tablet Take 1 tablet (50 mg total) by mouth 2 (two) times daily.  . nitroGLYCERIN (NITROSTAT) 0.4 MG SL tablet Place 1 tablet (0.4 mg total) under the tongue every 5 (five) minutes as needed for chest pain.  . rosuvastatin (CRESTOR) 40 MG tablet Take 1 tablet (40 mg total) by mouth daily.     Allergies:   Ivp dye [iodinated diagnostic agents]   Social History   Socioeconomic History  . Marital status: Significant Other    Spouse name:  None  . Number of children: 0  . Years of education: None  . Highest education level: None  Social Needs  . Financial resource strain: None  . Food insecurity - worry: None  . Food insecurity - inability: None  . Transportation needs - medical: None  . Transportation needs - non-medical: None  Occupational History  . None  Tobacco Use  . Smoking status: Never Smoker  . Smokeless tobacco: Never Used  Substance and Sexual Activity  . Alcohol use: Yes    Comment: Drinks occassionally  . Drug use: No  . Sexual activity: None  Other Topics Concern  . None  Social History Narrative  . None     Family History:  The patient's family history includes Cancer in his  maternal aunt; Diabetes in his unknown relative; Heart disease in his unknown relative; Hyperlipidemia in his unknown relative.   ROS:   Please see the history of present illness.    Review of Systems  Constitution: Negative.  HENT: Negative.   Cardiovascular: Negative.   Respiratory: Negative.   Endocrine: Negative.   Hematologic/Lymphatic: Negative.   Musculoskeletal: Positive for arthritis and back pain.  Gastrointestinal: Positive for abdominal pain.  Genitourinary: Negative.   Neurological: Negative.    All other systems reviewed and are negative.   PHYSICAL EXAM:   VS:  Ht 5\' 8"  (1.727 m)   Wt 219 lb 12.8 oz (99.7 kg)   BMI 33.42 kg/m   Physical Exam  GEN: Well nourished, well developed, in no acute distress  Neck: no JVD, carotid bruits, or masses Cardiac:RRR; positive S4, no murmurs, rubs, or gallops  Respiratory:  clear to auscultation bilaterally, normal work of breathing GI: soft, nontender, nondistended, + BS Ext: without cyanosis, clubbing, or edema, Good distal pulses bilaterally Neuro:  Alert and Oriented x 3  Psych: euthymic mood, full affect  Wt Readings from Last 3 Encounters:  06/28/17 219 lb 12.8 oz (99.7 kg)  03/23/17 222 lb 6.4 oz (100.9 kg)  10/27/16 227 lb (103 kg)      Studies/Labs Reviewed:   EKG:  EKG is  ordered today.  The ekg ordered today demonstrates sinus bradycardia with left axis deviation, no acute change  Recent Labs: 08/11/2016: ALT 29; BUN 19; Creat 1.16; Hemoglobin 14.4; Platelets 223; Potassium 4.4; Sodium 139   Lipid Panel    Component Value Date/Time   CHOL 133 08/11/2016 1030   TRIG 96 08/11/2016 1030   HDL 37 (L) 08/11/2016 1030   CHOLHDL 3.6 08/11/2016 1030   VLDL 19 08/11/2016 1030   LDLCALC 77 08/11/2016 1030   LDLDIRECT 164.1 05/04/2013 1107    Additional studies/ records that were reviewed today include:   Cardiac cath 03/31/11: 1. Right coronary artery: The vessel is large in size and dominant.  There is  a mild 20% disease in the mid segment with minor luminal  irregularities distally. The PDA is normal in size and free of  significant disease. First posterolateral branch is normal in size  with 40% proximal disease. The second posterolateral branch is  relatively large in size with 50% disease at the ostium followed by  a 70% lesion proximally and diffuse 80% disease distally. The RCA  gives collaterals to the left anterior descending artery as well as  OM-3.  2. Left main coronary artery: The vessel is normal in size without  significant disease.  3. Left anterior descending artery: The vessel is normal in size and  is occluded proximally a small  septal branch. The distal LAD does  not have significant disease. The first diagonal branch is normal  to large in size and has 2 branches. The superior branch is  overall normal. The inferior branch has a 90% proximal disease.  4. Left circumflex artery: The vessel is normal in size and  nondominant. The first obtuse marginal is normal in size with a  mild proximal disease. The second OM is normal in size with no  significant disease. Third OM appears to be occluded with  collaterals supplied from the distal LAD and the right coronary  Artery.      ASSESSMENT:    1. Coronary artery disease involving native coronary artery of native heart without angina pectoris   2. Essential hypertension   3. Hyperlipidemia, unspecified hyperlipidemia type      PLAN:  In order of problems listed above:   CAD status post STEMI 03/31/11 treated with DES to the LAD with moderate disease in other vessels.  No recent angina, episode of epigastric pain relieved with laxative.  Follow-up with Dr. Clifton James in 1 year.  Renew nitroglycerin.  Hypertension blood pressure well controlled.  HLD on statin due for blood work next month and will be done by primary care.   Medication Adjustments/Labs and Tests Ordered: Current medicines are reviewed at length  with the patient today.  Concerns regarding medicines are outlined above.  Medication changes, Labs and Tests ordered today are listed in the Patient Instructions below. There are no Patient Instructions on file for this visit.   Elson Clan, PA-C  06/28/2017 8:12 AM    [facility] [address] [place], [place], [place]  [address] Phone: [phone]; Fax: [phone]

## 2017-06-28 ENCOUNTER — Ambulatory Visit (INDEPENDENT_AMBULATORY_CARE_PROVIDER_SITE_OTHER): Payer: Medicare Other | Admitting: Physician Assistant

## 2017-06-28 ENCOUNTER — Encounter: Payer: Self-pay | Admitting: Physician Assistant

## 2017-06-28 VITALS — BP 134/80 | HR 60 | Ht 68.0 in | Wt 219.8 lb

## 2017-06-28 DIAGNOSIS — I2510 Atherosclerotic heart disease of native coronary artery without angina pectoris: Secondary | ICD-10-CM | POA: Diagnosis not present

## 2017-06-28 DIAGNOSIS — E785 Hyperlipidemia, unspecified: Secondary | ICD-10-CM | POA: Diagnosis not present

## 2017-06-28 DIAGNOSIS — I10 Essential (primary) hypertension: Secondary | ICD-10-CM | POA: Diagnosis not present

## 2017-06-28 MED ORDER — NITROGLYCERIN 0.4 MG SL SUBL: 0.4000 mg | SUBLINGUAL_TABLET | SUBLINGUAL | 3 refills | Status: DC | PRN

## 2017-06-28 NOTE — ED Provider Notes (Signed)
[facility]   [id number] 06/23/17 Arrival Time: 1621  ASSESSMENT & PLAN:  1. Abdominal discomfort   - resolved  Normal exam. Observation. Abdominal pain precautions given. Will schedule appt with PCP asap, especially if symptoms return. May f/u here as needed.  Reviewed expectations re: course of current medical issues. Questions answered. Outlined signs and symptoms indicating need for more acute intervention. Patient verbalized understanding. After Visit Summary given.   SUBJECTIVE:  Barbarann EhlersGary S Rando is a 57 y.o. male who presents with complaint of intermittent abdominal discomfort; epigastric and questions sometimes more over L upper abdomen without radiation. Onset today. Described as dull. Symptoms are completely resolved since beginning. Aggravating factors: questions slight worsening with movement when symptoms were present. Alleviating factors: none. Associated symptoms: none. He denies anorexia, belching, chills, constipation, diarrhea, dysuria, fever, nausea and vomiting. Appetite: normal. PO intake: normal. Ambulatory without assistance. Urinary symptoms: none. Last bowel movement yesterday without blood. OTC treatment: none.   Past Surgical History:  Procedure Laterality Date  . COLONOSCOPY N/A 08/16/2014   Procedure: COLONOSCOPY;  Surgeon: West BaliSandi L Fields, MD;  [facility];  Service: Endoscopy;  Laterality: N/A;  10:15 AM  . DOPPLER ECHOCARDIOGRAPHY     Preserved left venticular function  . LEG SURGERY     left leg - has rod and 4 pins  . PATELLAR TENDON REPAIR Right 04/26/2014   Procedure: RIGHT PATELLA TENDON REPAIR;  Surgeon: Sheral Apleyimothy D Murphy, MD;  Location: [facility];  Service: Orthopedics;  Laterality: Right;    ROS: As per HPI.  OBJECTIVE:  Vitals:   06/23/17 1709  BP: (!) 144/89  Pulse: 75  Resp: 20  Temp: 98.3 F (36.8 C)  TempSrc: Oral  SpO2: 96%    General appearance: alert; no distress Lungs: clear to  auscultation bilaterally Heart: regular rate and rhythm Abdomen: soft; non-distended; no tenderness; bowel sounds present; no masses or organomegaly; no guarding or rebound tenderness Back: no CVA tenderness; FROM at hips Extremities: no edema; symmetrical with no gross deformities Skin: warm and dry Neurologic: normal gait Psychological: alert and cooperative; normal mood and affect   Allergies  Allergen Reactions  . Ivp Dye [Iodinated Diagnostic Agents] Swelling                                               Past Medical History:  Diagnosis Date  . Alcohol abuse   . Anterior myocardial infarction [facility](HCC)    ST-elevation; S/P emergent  drug-eluting stenting of proximal left anterior descending  . Coronary artery disease    a. STEMI 2012 - s/p DES to LAD with  diffuse distal disease in the right posterolateral branch. The third OM was occluded and filled from left to left collaterals. LVEF preserved.  . Hyperlipidemia   . Hypertension   . Pre-diabetes   . Renal insufficiency    Social History   Socioeconomic History  . Marital status: Significant Other    Spouse name: Not on file  . Number of children: 0  . Years of education: Not on file  . Highest education level: Not on file  Social Needs  . Financial resource strain: Not on file  . Food insecurity - worry: Not on file  . Food insecurity - inability: Not on file  . Transportation needs - medical: Not on file  . Transportation needs -  non-medical: Not on file  Occupational History  . Not on file  Tobacco Use  . Smoking status: Never Smoker  . Smokeless tobacco: Never Used  Substance and Sexual Activity  . Alcohol use: Yes    Comment: Drinks occassionally  . Drug use: No  . Sexual activity: Not on file  Other Topics Concern  . Not on file  Social History Narrative  . Not on file   Family History  Problem Relation Age of Onset  . Cancer Maternal Aunt   . Hyperlipidemia Unknown   . Diabetes Unknown   . Heart  disease Unknown      Mardella Layman, MD 06/29/17 1104

## 2017-06-28 NOTE — Patient Instructions (Signed)
Medication Instructions:  Your physician recommends that you continue on your current medications as directed. Please refer to the Current Medication list given to you today. 1. Nitroglycerin sent in today to patient's requested pharmacy.   Labwork: -None  Testing/Procedures: -None  Follow-Up: Your physician wants you to follow-up in: 1 year with Dr. [name]. You will receive a reminder letter in the mail two months in advance. If you don't receive a letter, please call our office to schedule the follow-up appointment.   Any Other Special Instructions Will Be Listed Below (If Applicable).     If you need a refill on your cardiac medications before your next appointment, please call your pharmacy.

## 2017-07-06 ENCOUNTER — Encounter: Payer: Self-pay | Admitting: Internal Medicine

## 2017-07-06 ENCOUNTER — Ambulatory Visit: Payer: Medicare Other | Attending: Internal Medicine | Admitting: Internal Medicine

## 2017-07-06 VITALS — BP 106/71 | HR 65 | Temp 98.2°F | Resp 18 | Ht 69.5 in | Wt 219.0 lb

## 2017-07-06 DIAGNOSIS — R358 Other polyuria: Secondary | ICD-10-CM | POA: Diagnosis not present

## 2017-07-06 DIAGNOSIS — R351 Nocturia: Secondary | ICD-10-CM | POA: Insufficient documentation

## 2017-07-06 DIAGNOSIS — M25562 Pain in left knee: Secondary | ICD-10-CM | POA: Diagnosis not present

## 2017-07-06 DIAGNOSIS — I2510 Atherosclerotic heart disease of native coronary artery without angina pectoris: Secondary | ICD-10-CM | POA: Diagnosis not present

## 2017-07-06 DIAGNOSIS — I10 Essential (primary) hypertension: Secondary | ICD-10-CM | POA: Diagnosis not present

## 2017-07-06 DIAGNOSIS — I739 Peripheral vascular disease, unspecified: Secondary | ICD-10-CM | POA: Insufficient documentation

## 2017-07-06 DIAGNOSIS — R350 Frequency of micturition: Secondary | ICD-10-CM | POA: Diagnosis not present

## 2017-07-06 DIAGNOSIS — Z91041 Radiographic dye allergy status: Secondary | ICD-10-CM | POA: Diagnosis not present

## 2017-07-06 DIAGNOSIS — G8929 Other chronic pain: Secondary | ICD-10-CM | POA: Diagnosis not present

## 2017-07-06 DIAGNOSIS — I252 Old myocardial infarction: Secondary | ICD-10-CM | POA: Diagnosis not present

## 2017-07-06 DIAGNOSIS — Z7982 Long term (current) use of aspirin: Secondary | ICD-10-CM | POA: Diagnosis not present

## 2017-07-06 DIAGNOSIS — M25561 Pain in right knee: Secondary | ICD-10-CM | POA: Diagnosis not present

## 2017-07-06 DIAGNOSIS — E785 Hyperlipidemia, unspecified: Secondary | ICD-10-CM | POA: Insufficient documentation

## 2017-07-06 DIAGNOSIS — R7303 Prediabetes: Secondary | ICD-10-CM | POA: Diagnosis not present

## 2017-07-06 DIAGNOSIS — Z79899 Other long term (current) drug therapy: Secondary | ICD-10-CM | POA: Insufficient documentation

## 2017-07-06 LAB — POCT URINALYSIS DIPSTICK
BILIRUBIN UA: NEGATIVE
Glucose, UA: NEGATIVE
KETONES UA: NEGATIVE
Leukocytes, UA: NEGATIVE
Nitrite, UA: NEGATIVE
PH UA: 5.5 (ref 5.0–8.0)
Protein, UA: NEGATIVE
Spec Grav, UA: 1.025 (ref 1.010–1.025)
UROBILINOGEN UA: 0.2 U/dL

## 2017-07-06 LAB — POCT GLYCOSYLATED HEMOGLOBIN (HGB A1C): Hemoglobin A1C: 6.1

## 2017-07-06 NOTE — Progress Notes (Signed)
Michael AlyGary Reade, is a 57 y.o. male  [id number]  [id number]  DOB - 11/14/1960  Chief Complaint  Patient presents with  . Follow-up      Subjective:   Michael [name] is a 57 y.o. male with history of CAD, HTN and PAD who presents here today for a follow up visit. He is complaining of frequent urination especially at night - nocturia. He denies any dysuria or polyuria but endorses hesitancy. He denies any fever. No personal history of BPH but patient prefers to get this checked out by urologist. He has no other complaint today. BP is well controlled. Patient is adherent with his medications. No reported side effects. Patient has No headache, No chest pain, No abdominal pain - No Nausea, No new weakness tingling or numbness, No Cough - SOB.   Problem  Frequency of Urination and Polyuria    ALLERGIES: Allergies  Allergen Reactions  . Ivp Dye [Iodinated Diagnostic Agents] Swelling    PAST MEDICAL HISTORY: Past Medical History:  Diagnosis Date  . Alcohol abuse   . Anterior myocardial infarction [facility](HCC)    ST-elevation; S/P emergent  drug-eluting stenting of proximal left anterior descending  . Coronary artery disease    a. STEMI 2012 - s/p DES to LAD with  diffuse distal disease in the right posterolateral branch. The third OM was occluded and filled from left to left collaterals. LVEF preserved.  . Hyperlipidemia   . Hypertension   . Pre-diabetes   . Renal insufficiency     MEDICATIONS AT HOME: Prior to Admission medications   Medication Sig Start Date End Date Taking? Authorizing Provider  acetaminophen (ACETAMINOPHEN EXTRA STRENGTH) 500 MG tablet Take 2 tablets (1,000 mg total) by mouth every 8 (eight) hours as needed for moderate pain. 03/23/17  Yes [name], Georgann Bramble E, MD  aspirin EC 81 MG tablet Take 81 mg by mouth daily.     Yes [provider]  lisinopril (PRINIVIL,ZESTRIL) 20 MG tablet Take 1 tablet (20 mg total) by mouth daily. 03/23/17  Yes [name],  Lydie Stammen E, MD  methocarbamol (ROBAXIN) 500 MG tablet Take 1 tablet (500 mg total) by mouth every 6 (six) hours as needed for muscle spasms. 03/23/17  Yes [name], Emaly Boschert E, MD  metoprolol tartrate (LOPRESSOR) 50 MG tablet Take 1 tablet (50 mg total) by mouth 2 (two) times daily. 03/23/17  Yes [name], Shalaina Guardiola E, MD  nitroGLYCERIN (NITROSTAT) 0.4 MG SL tablet Place 1 tablet (0.4 mg total) under the tongue every 5 (five) minutes as needed for chest pain. 06/28/17  Yes Dyann KiefLenze, Michele M, PA-C  rosuvastatin (CRESTOR) 40 MG tablet Take 1 tablet (40 mg total) by mouth daily. 03/23/17  Yes [name], Marcin Holte E, MD    Objective:   Vitals:   07/06/17 1121  BP: 106/71  Pulse: 65  Resp: 18  Temp: 98.2 F (36.8 C)  TempSrc: Oral  SpO2: 96%  Weight: 219 lb (99.3 kg)  Height: 5' 9.5" (1.765 m)   Exam General appearance : Awake, alert, not in any distress. Speech Clear. Not toxic looking HEENT: Atraumatic and Normocephalic, pupils equally reactive to light and accomodation Neck: Supple, no JVD. No cervical lymphadenopathy.  Chest: Good air entry bilaterally, no added sounds  CVS: S1 S2 regular, no murmurs.  Abdomen: Bowel sounds present, Non tender and not distended with no gaurding, rigidity or rebound. Extremities: B/L Lower Ext shows no edema, both legs are warm to touch Neurology: Awake alert, and oriented X 3, CN II-XII intact, Non focal  Skin: No Rash  Data Review Lab Results  Component Value Date   HGBA1C 6.4 (H) 03/23/2017   HGBA1C 6.0 04/01/2016   HGBA1C 5.90 09/03/2014    Assessment & Plan   1. Essential hypertension  We have discussed target BP range and blood pressure goal. I have advised patient to check BP regularly and to call us back or report to clinic if the numbers are consistently higher than 140/90. We discussed the importance of compliance with medical therapy and DASH diet recommended, consequences of uncontrolled hypertension discussed.  - continue current  BP medications  2. PAD (peripheral artery disease) (HCC)  - Continue follow up with Vascular Surgery  3. Chronic pain of both knees  - Continue current medications  4. Frequency of urination and polyuria  - Ambulatory referral to Urology  Patient have been counseled extensively about nutrition and exercise. Other issues discussed during this visit include: low cholesterol diet, weight control and daily exercise, importance of adherence with medications and regular follow-up. We also discussed long term complications of uncontrolled hypertension.   Return in about 3 months (around 10/04/2017) for Follow up HTN, Follow up Pain and comorbidities.  The patient was given clear instructions to go to ER or return to medical center if symptoms don't improve, worsen or new problems develop. The patient verbalized understanding. The patient was told to call to get lab results if they haven't heard anything in the next week.   This note has been created with Education officer, environmental. Any transcriptional errors are unintentional.    Jeanann Lewandowsky, MD, MHA, Maxwell Caul, CPE [facility] [place], [place] [phone]   07/06/2017, 12:18 PM

## 2017-07-06 NOTE — Patient Instructions (Signed)
DASH Eating Plan DASH stands for "Dietary Approaches to Stop Hypertension." The DASH eating plan is a healthy eating plan that has been shown to reduce high blood pressure (hypertension). It may also reduce your risk for type 2 diabetes, heart disease, and stroke. The DASH eating plan may also help with weight loss. What are tips for following this plan? General guidelines  Avoid eating more than 2,300 mg (milligrams) of salt (sodium) a day. If you have hypertension, you may need to reduce your sodium intake to 1,500 mg a day.  Limit alcohol intake to no more than 1 drink a day for nonpregnant women and 2 drinks a day for men. One drink equals 12 oz of beer, 5 oz of wine, or 1 oz of hard liquor.  Work with your health care provider to maintain a healthy body weight or to lose weight. Ask what an ideal weight is for you.  Get at least 30 minutes of exercise that causes your heart to beat faster (aerobic exercise) most days of the week. Activities may include walking, swimming, or biking.  Work with your health care provider or diet and nutrition specialist (dietitian) to adjust your eating plan to your individual calorie needs. Reading food labels  Check food labels for the amount of sodium per serving. Choose foods with less than 5 percent of the Daily Value of sodium. Generally, foods with less than 300 mg of sodium per serving fit into this eating plan.  To find whole grains, look for the word "whole" as the first word in the ingredient list. Shopping  Buy products labeled as "low-sodium" or "no salt added."  Buy fresh foods. Avoid canned foods and premade or frozen meals. Cooking  Avoid adding salt when cooking. Use salt-free seasonings or herbs instead of table salt or sea salt. Check with your health care provider or pharmacist before using salt substitutes.  Do not fry foods. Cook foods using healthy methods such as baking, boiling, grilling, and broiling instead.  Cook with  heart-healthy oils, such as olive, canola, soybean, or sunflower oil. Meal planning   Eat a balanced diet that includes: ? 5 or more servings of fruits and vegetables each day. At each meal, try to fill half of your plate with fruits and vegetables. ? Up to 6-8 servings of whole grains each day. ? Less than 6 oz of lean meat, poultry, or fish each day. A 3-oz serving of meat is about the same size as a deck of cards. One egg equals 1 oz. ? 2 servings of low-fat dairy each day. ? A serving of nuts, seeds, or beans 5 times each week. ? Heart-healthy fats. Healthy fats called Omega-3 fatty acids are found in foods such as flaxseeds and coldwater fish, like sardines, salmon, and mackerel.  Limit how much you eat of the following: ? Canned or prepackaged foods. ? Food that is high in trans fat, such as fried foods. ? Food that is high in saturated fat, such as fatty meat. ? Sweets, desserts, sugary drinks, and other foods with added sugar. ? Full-fat dairy products.  Do not salt foods before eating.  Try to eat at least 2 vegetarian meals each week.  Eat more home-cooked food and less restaurant, buffet, and fast food.  When eating at a restaurant, ask that your food be prepared with less salt or no salt, if possible. What foods are recommended? The items listed may not be a complete list. Talk with your dietitian about what   dietary choices are best for you. Grains Whole-grain or whole-wheat bread. Whole-grain or whole-wheat pasta. Brown rice. Oatmeal. Quinoa. Bulgur. Whole-grain and low-sodium cereals. Pita bread. Low-fat, low-sodium crackers. Whole-wheat flour tortillas. Vegetables Fresh or frozen vegetables (raw, steamed, roasted, or grilled). Low-sodium or reduced-sodium tomato and vegetable juice. Low-sodium or reduced-sodium tomato sauce and tomato paste. Low-sodium or reduced-sodium canned vegetables. Fruits All fresh, dried, or frozen fruit. Canned fruit in natural juice (without  added sugar). Meat and other protein foods Skinless chicken or turkey. Ground chicken or turkey. Pork with fat trimmed off. Fish and seafood. Egg whites. Dried beans, peas, or lentils. Unsalted nuts, nut butters, and seeds. Unsalted canned beans. Lean cuts of beef with fat trimmed off. Low-sodium, lean deli meat. Dairy Low-fat (1%) or fat-free (skim) milk. Fat-free, low-fat, or reduced-fat cheeses. Nonfat, low-sodium ricotta or cottage cheese. Low-fat or nonfat yogurt. Low-fat, low-sodium cheese. Fats and oils Soft margarine without trans fats. Vegetable oil. Low-fat, reduced-fat, or light mayonnaise and salad dressings (reduced-sodium). Canola, safflower, olive, soybean, and sunflower oils. Avocado. Seasoning and other foods Herbs. Spices. Seasoning mixes without salt. Unsalted popcorn and pretzels. Fat-free sweets. What foods are not recommended? The items listed may not be a complete list. Talk with your dietitian about what dietary choices are best for you. Grains Baked goods made with fat, such as croissants, muffins, or some breads. Dry pasta or rice meal packs. Vegetables Creamed or fried vegetables. Vegetables in a cheese sauce. Regular canned vegetables (not low-sodium or reduced-sodium). Regular canned tomato sauce and paste (not low-sodium or reduced-sodium). Regular tomato and vegetable juice (not low-sodium or reduced-sodium). Pickles. Olives. Fruits Canned fruit in a light or heavy syrup. Fried fruit. Fruit in cream or butter sauce. Meat and other protein foods Fatty cuts of meat. Ribs. Fried meat. Bacon. Sausage. Bologna and other processed lunch meats. Salami. Fatback. Hotdogs. Bratwurst. Salted nuts and seeds. Canned beans with added salt. Canned or smoked fish. Whole eggs or egg yolks. Chicken or turkey with skin. Dairy Whole or 2% milk, cream, and half-and-half. Whole or full-fat cream cheese. Whole-fat or sweetened yogurt. Full-fat cheese. Nondairy creamers. Whipped toppings.  Processed cheese and cheese spreads. Fats and oils Butter. Stick margarine. Lard. Shortening. Ghee. Bacon fat. Tropical oils, such as coconut, palm kernel, or palm oil. Seasoning and other foods Salted popcorn and pretzels. Onion salt, garlic salt, seasoned salt, table salt, and sea salt. Worcestershire sauce. Tartar sauce. Barbecue sauce. Teriyaki sauce. Soy sauce, including reduced-sodium. Steak sauce. Canned and packaged gravies. Fish sauce. Oyster sauce. Cocktail sauce. Horseradish that you find on the shelf. Ketchup. Mustard. Meat flavorings and tenderizers. Bouillon cubes. Hot sauce and Tabasco sauce. Premade or packaged marinades. Premade or packaged taco seasonings. Relishes. Regular salad dressings. Where to find more information:  National Heart, Lung, and Blood Institute: www.nhlbi.nih.gov  American Heart Association: www.heart.org Summary  The DASH eating plan is a healthy eating plan that has been shown to reduce high blood pressure (hypertension). It may also reduce your risk for type 2 diabetes, heart disease, and stroke.  With the DASH eating plan, you should limit salt (sodium) intake to 2,300 mg a day. If you have hypertension, you may need to reduce your sodium intake to 1,500 mg a day.  When on the DASH eating plan, aim to eat more fresh fruits and vegetables, whole grains, lean proteins, low-fat dairy, and heart-healthy fats.  Work with your health care provider or diet and nutrition specialist (dietitian) to adjust your eating plan to your individual   calorie needs. This information is not intended to replace advice given to you by your health care provider. Make sure you discuss any questions you have with your health care provider. Document Released: 05/20/2011 Document Revised: 05/24/2016 Document Reviewed: 05/24/2016 Elsevier Interactive Patient Education  2018 Elsevier Inc.  Hypertension Hypertension, commonly called high blood pressure, is when the force of blood  pumping through the arteries is too strong. The arteries are the blood vessels that carry blood from the heart throughout the body. Hypertension forces the heart to work harder to pump blood and may cause arteries to become narrow or stiff. Having untreated or uncontrolled hypertension can cause heart attacks, strokes, kidney disease, and other problems. A blood pressure reading consists of a higher number over a lower number. Ideally, your blood pressure should be below 120/80. The first ("top") number is called the systolic pressure. It is a measure of the pressure in your arteries as your heart beats. The second ("bottom") number is called the diastolic pressure. It is a measure of the pressure in your arteries as the heart relaxes. What are the causes? The cause of this condition is not known. What increases the risk? Some risk factors for high blood pressure are under your control. Others are not. Factors you can change  Smoking.  Having type 2 diabetes mellitus, high cholesterol, or both.  Not getting enough exercise or physical activity.  Being overweight.  Having too much fat, sugar, calories, or salt (sodium) in your diet.  Drinking too much alcohol. Factors that are difficult or impossible to change  Having chronic kidney disease.  Having a family history of high blood pressure.  Age. Risk increases with age.  Race. You may be at higher risk if you are African-American.  Gender. Men are at higher risk than women before age 45. After age 65, women are at higher risk than men.  Having obstructive sleep apnea.  Stress. What are the signs or symptoms? Extremely high blood pressure (hypertensive crisis) may cause:  Headache.  Anxiety.  Shortness of breath.  Nosebleed.  Nausea and vomiting.  Severe chest pain.  Jerky movements you cannot control (seizures).  How is this diagnosed? This condition is diagnosed by measuring your blood pressure while you are  seated, with your arm resting on a surface. The cuff of the blood pressure monitor will be placed directly against the skin of your upper arm at the level of your heart. It should be measured at least twice using the same arm. Certain conditions can cause a difference in blood pressure between your right and left arms. Certain factors can cause blood pressure readings to be lower or higher than normal (elevated) for a short period of time:  When your blood pressure is higher when you are in a health care provider's office than when you are at home, this is called white coat hypertension. Most people with this condition do not need medicines.  When your blood pressure is higher at home than when you are in a health care provider's office, this is called masked hypertension. Most people with this condition may need medicines to control blood pressure.  If you have a high blood pressure reading during one visit or you have normal blood pressure with other risk factors:  You may be asked to return on a different day to have your blood pressure checked again.  You may be asked to monitor your blood pressure at home for 1 week or longer.  If you are   diagnosed with hypertension, you may have other blood or imaging tests to help your health care provider understand your overall risk for other conditions. How is this treated? This condition is treated by making healthy lifestyle changes, such as eating healthy foods, exercising more, and reducing your alcohol intake. Your health care provider may prescribe medicine if lifestyle changes are not enough to get your blood pressure under control, and if:  Your systolic blood pressure is above 130.  Your diastolic blood pressure is above 80.  Your personal target blood pressure may vary depending on your medical conditions, your age, and other factors. Follow these instructions at home: Eating and drinking  Eat a diet that is high in fiber and potassium,  and low in sodium, added sugar, and fat. An example eating plan is called the DASH (Dietary Approaches to Stop Hypertension) diet. To eat this way: ? Eat plenty of fresh fruits and vegetables. Try to fill half of your plate at each meal with fruits and vegetables. ? Eat whole grains, such as whole wheat pasta, brown rice, or whole grain bread. Fill about one quarter of your plate with whole grains. ? Eat or drink low-fat dairy products, such as skim milk or low-fat yogurt. ? Avoid fatty cuts of meat, processed or cured meats, and poultry with skin. Fill about one quarter of your plate with lean proteins, such as fish, chicken without skin, beans, eggs, and tofu. ? Avoid premade and processed foods. These tend to be higher in sodium, added sugar, and fat.  Reduce your daily sodium intake. Most people with hypertension should eat less than 1,500 mg of sodium a day.  Limit alcohol intake to no more than 1 drink a day for nonpregnant women and 2 drinks a day for men. One drink equals 12 oz of beer, 5 oz of wine, or 1 oz of hard liquor. Lifestyle  Work with your health care provider to maintain a healthy body weight or to lose weight. Ask what an ideal weight is for you.  Get at least 30 minutes of exercise that causes your heart to beat faster (aerobic exercise) most days of the week. Activities may include walking, swimming, or biking.  Include exercise to strengthen your muscles (resistance exercise), such as pilates or lifting weights, as part of your weekly exercise routine. Try to do these types of exercises for 30 minutes at least 3 days a week.  Do not use any products that contain nicotine or tobacco, such as cigarettes and e-cigarettes. If you need help quitting, ask your health care provider.  Monitor your blood pressure at home as told by your health care provider.  Keep all follow-up visits as told by your health care provider. This is important. Medicines  Take over-the-counter and  prescription medicines only as told by your health care provider. Follow directions carefully. Blood pressure medicines must be taken as prescribed.  Do not skip doses of blood pressure medicine. Doing this puts you at risk for problems and can make the medicine less effective.  Ask your health care provider about side effects or reactions to medicines that you should watch for. Contact a health care provider if:  You think you are having a reaction to a medicine you are taking.  You have headaches that keep coming back (recurring).  You feel dizzy.  You have swelling in your ankles.  You have trouble with your vision. Get help right away if:  You develop a severe headache or confusion.    You have unusual weakness or numbness.  You feel faint.  You have severe pain in your chest or abdomen.  You vomit repeatedly.  You have trouble breathing. Summary  Hypertension is when the force of blood pumping through your arteries is too strong. If this condition is not controlled, it may put you at risk for serious complications.  Your personal target blood pressure may vary depending on your medical conditions, your age, and other factors. For most people, a normal blood pressure is less than 120/80.  Hypertension is treated with lifestyle changes, medicines, or a combination of both. Lifestyle changes include weight loss, eating a healthy, low-sodium diet, exercising more, and limiting alcohol. This information is not intended to replace advice given to you by your health care provider. Make sure you discuss any questions you have with your health care provider. Document Released: 05/31/2005 Document Revised: 04/28/2016 Document Reviewed: 04/28/2016 Elsevier Interactive Patient Education  2018 Elsevier Inc.  

## 2017-07-07 ENCOUNTER — Telehealth: Payer: Self-pay | Admitting: Internal Medicine

## 2017-07-07 NOTE — Telephone Encounter (Signed)
Pt came by to drop off application

## 2017-07-19 ENCOUNTER — Encounter (INDEPENDENT_AMBULATORY_CARE_PROVIDER_SITE_OTHER): Payer: Self-pay | Admitting: Orthopaedic Surgery

## 2017-07-19 ENCOUNTER — Ambulatory Visit (INDEPENDENT_AMBULATORY_CARE_PROVIDER_SITE_OTHER): Payer: Medicare Other

## 2017-07-19 ENCOUNTER — Ambulatory Visit (INDEPENDENT_AMBULATORY_CARE_PROVIDER_SITE_OTHER): Payer: Medicare Other | Admitting: Orthopaedic Surgery

## 2017-07-19 VITALS — Ht 69.0 in | Wt 219.0 lb

## 2017-07-19 DIAGNOSIS — M25562 Pain in left knee: Secondary | ICD-10-CM

## 2017-07-19 DIAGNOSIS — I2510 Atherosclerotic heart disease of native coronary artery without angina pectoris: Secondary | ICD-10-CM

## 2017-07-19 DIAGNOSIS — M5442 Lumbago with sciatica, left side: Secondary | ICD-10-CM | POA: Diagnosis not present

## 2017-07-19 DIAGNOSIS — M25561 Pain in right knee: Secondary | ICD-10-CM | POA: Diagnosis not present

## 2017-07-19 DIAGNOSIS — G8929 Other chronic pain: Secondary | ICD-10-CM

## 2017-07-19 MED ORDER — METHYLPREDNISOLONE ACETATE 40 MG/ML IJ SUSP
40.0000 mg | INTRAMUSCULAR | Status: AC | PRN
  Administered 2017-07-19: 40 mg via INTRA_ARTICULAR

## 2017-07-19 MED ORDER — BUPIVACAINE HCL 0.5 % IJ SOLN
2.0000 mL | INTRAMUSCULAR | Status: AC | PRN
  Administered 2017-07-19: 2 mL via INTRA_ARTICULAR

## 2017-07-19 MED ORDER — LIDOCAINE HCL 1 % IJ SOLN
2.0000 mL | INTRAMUSCULAR | Status: AC | PRN
  Administered 2017-07-19: 2 mL

## 2017-07-19 NOTE — Progress Notes (Signed)
MRI L SPINE 

## 2017-07-19 NOTE — Progress Notes (Signed)
Office Visit Note   Patient: Michael [name]           Date of Birth: 11/27/1960           MRN: [id number] Visit Date: 07/19/2017              Requested by: [name], Olugbemiga E, MD [address] [place], [place] [id number] PCP: [name], Olugbemiga E, MD   Assessment & Plan: Visit Diagnoses:  1. Chronic pain of right knee   2. Chronic pain of left knee   3. Chronic left-sided low back pain with left-sided sciatica     Plan: Impression is lumbar radiculopathy and bilateral knee osteoarthritis.  Recommend MRI to rule out structural abnormalities.  Bilateral cortisone knee injections were performed  Follow-Up Instructions: Return if symptoms worsen or fail to improve.   Orders:  Orders Placed This Encounter  Procedures  . XR Knee Complete 4 Views Right  . XR Knee Complete 4 Views Left  . XR Lumbar Spine 2-3 Views  . MR Lumbar Spine w/o contrast   No orders of the defined types were placed in this encounter.     Procedures: Large Joint Inj: bilateral knee on 07/19/2017 11:08 AM Indications: pain Details: 22 G needle  Arthrogram: No  Medications (Right): 2 mL lidocaine 1 %; 2 mL bupivacaine 0.5 %; 40 mg methylPREDNISolone acetate 40 MG/ML Medications (Left): 2 mL lidocaine 1 %; 2 mL bupivacaine 0.5 %; 40 mg methylPREDNISolone acetate 40 MG/ML Outcome: tolerated well, no immediate complications Patient was prepped and draped in the usual sterile fashion.       Clinical Data: No additional findings.   Subjective: Chief Complaint  Patient presents with  . Left Knee - Pain  . Right Knee - Pain  . Lower Back - Pain    Patient is a 57 year old man who comes in for follow-up of bilateral knee pain due to osteoarthritis and left leg radicular pain that has been going on for couple years.  He walks with a cane.  He does endorse some mild numbness and tingling in his left foot.  Denies any injuries to his knees.  He had good relief from previous cortisone injections.   He is requesting another injection today.    Review of Systems  Constitutional: Negative.   All other systems reviewed and are negative.    Objective: Vital Signs: Ht 5\' 9"  (1.753 m)   Wt 219 lb (99.3 kg)   BMI 32.34 kg/m   Physical Exam  Constitutional: He is oriented to person, place, and time. He appears well-developed and well-nourished.  Pulmonary/Chest: Effort normal.  Abdominal: Soft.  Neurological: He is alert and oriented to person, place, and time.  Skin: Skin is warm.  Psychiatric: He has a normal mood and affect. His behavior is normal. Judgment and thought content normal.  Nursing note and vitals reviewed.   Ortho Exam Bilateral knee exam shows no joint effusion.  Fully healed surgical scars.  Collaterals and cruciates are stable.  Negative straight leg.  No focal motor or sensory deficits. Specialty Comments:  No specialty comments available.  Imaging: Xr Knee Complete 4 Views Left  Result Date: 07/19/2017 Moderate osteoarthritis left knee  Xr Knee Complete 4 Views Right  Result Date: 07/19/2017 Moderate osteoarthritis  Xr Lumbar Spine 2-3 Views  Result Date: 07/19/2017  degenerative disc disease L3-L4.  Lumbar spondylosis.    PMFS History: Patient Active Problem List   Diagnosis Date Noted  . Frequency of urination and  polyuria 07/06/2017  . Erectile dysfunction due to diseases classified elsewhere 10/27/2016  . PAD (peripheral artery disease) (HCC) 08/11/2016  . Nocturia 08/11/2016  . Chronic pain of both knees 04/01/2016  . Palpitations 05/05/2015  . Hyperlipidemia 05/05/2015  . HNP (herniated nucleus pulposus), cervical 10/14/2014  . Chronic low back pain 10/14/2014  . Numbness and tingling of both upper extremities while sleeping 10/14/2014  . Frequent urination 09/03/2014  . Lower abdominal pain 09/03/2014  . Special screening for malignant neoplasms, colon   . Cervical stenosis of spine 05/16/2014  . Essential hypertension 05/16/2014    . Numbness and tingling of right arm 11/28/2013  . Dental abscess 04/26/2013  . Dysuria 04/26/2013  . HTN (hypertension) 04/04/2013  . CAD (coronary artery disease) 05/25/2011   Past Medical History:  Diagnosis Date  . Alcohol abuse   . Anterior myocardial infarction [facility])    ST-elevation; S/P emergent  drug-eluting stenting of proximal left anterior descending  . Coronary artery disease    a. STEMI 2012 - s/p DES to LAD with  diffuse distal disease in the right posterolateral branch. The third OM was occluded and filled from left to left collaterals. LVEF preserved.  . Hyperlipidemia   . Hypertension   . Pre-diabetes   . Renal insufficiency     Family History  Problem Relation Age of Onset  . Cancer Maternal Aunt   . Hyperlipidemia Unknown   . Diabetes Unknown   . Heart disease Unknown     Past Surgical History:  Procedure Laterality Date  . COLONOSCOPY N/A 08/16/2014   Procedure: COLONOSCOPY;  Surgeon: West Bali, MD;  [facility];  Service: Endoscopy;  Laterality: N/A;  10:15 AM  . DOPPLER ECHOCARDIOGRAPHY     Preserved left venticular function  . LEG SURGERY     left leg - has rod and 4 pins  . PATELLAR TENDON REPAIR Right 04/26/2014   Procedure: RIGHT PATELLA TENDON REPAIR;  Surgeon: Sheral Apley, MD;  Location: [facility];  Service: Orthopedics;  Laterality: Right;   Social History   Occupational History  . Not on file  Tobacco Use  . Smoking status: Never Smoker  . Smokeless tobacco: Never Used  Substance and Sexual Activity  . Alcohol use: Yes    Comment: Drinks occassionally  . Drug use: No  . Sexual activity: Not on file

## 2017-08-02 ENCOUNTER — Telehealth: Payer: Self-pay | Admitting: Internal Medicine

## 2017-08-02 NOTE — Telephone Encounter (Signed)
Patient called asking for handicap paper work that he dropped of on the 24 of January please fu with patient.

## 2017-08-05 ENCOUNTER — Other Ambulatory Visit: Payer: Medicare Other

## 2017-08-11 ENCOUNTER — Ambulatory Visit (INDEPENDENT_AMBULATORY_CARE_PROVIDER_SITE_OTHER): Payer: Medicare Other | Admitting: Orthopaedic Surgery

## 2017-08-16 ENCOUNTER — Ambulatory Visit (INDEPENDENT_AMBULATORY_CARE_PROVIDER_SITE_OTHER): Payer: Medicare Other | Admitting: Orthopaedic Surgery

## 2017-08-17 ENCOUNTER — Other Ambulatory Visit: Payer: Medicare Other

## 2017-08-22 ENCOUNTER — Ambulatory Visit (INDEPENDENT_AMBULATORY_CARE_PROVIDER_SITE_OTHER): Payer: Medicare Other | Admitting: Orthopaedic Surgery

## 2017-08-22 ENCOUNTER — Other Ambulatory Visit: Payer: Self-pay | Admitting: Pharmacist

## 2017-08-23 ENCOUNTER — Encounter: Payer: Self-pay | Admitting: Pharmacist

## 2017-08-23 ENCOUNTER — Other Ambulatory Visit: Payer: Medicare Other

## 2017-08-23 NOTE — Progress Notes (Signed)
PA completed and approved for methocarbamol through 08/22/2018

## 2017-11-07 ENCOUNTER — Other Ambulatory Visit: Payer: Self-pay | Admitting: Internal Medicine

## 2017-11-07 DIAGNOSIS — I10 Essential (primary) hypertension: Secondary | ICD-10-CM

## 2017-12-01 ENCOUNTER — Other Ambulatory Visit: Payer: Self-pay | Admitting: Family Medicine

## 2017-12-01 DIAGNOSIS — I10 Essential (primary) hypertension: Secondary | ICD-10-CM

## 2018-01-01 ENCOUNTER — Other Ambulatory Visit: Payer: Self-pay | Admitting: Family Medicine

## 2018-01-01 DIAGNOSIS — I10 Essential (primary) hypertension: Secondary | ICD-10-CM

## 2018-01-02 ENCOUNTER — Other Ambulatory Visit: Payer: Self-pay | Admitting: Cardiovascular Disease

## 2018-01-02 DIAGNOSIS — I10 Essential (primary) hypertension: Secondary | ICD-10-CM

## 2018-01-02 DIAGNOSIS — I739 Peripheral vascular disease, unspecified: Secondary | ICD-10-CM

## 2018-01-02 MED ORDER — NITROGLYCERIN 0.4 MG SL SUBL: 0.4000 mg | SUBLINGUAL_TABLET | SUBLINGUAL | 4 refills | Status: DC | PRN

## 2018-01-02 MED ORDER — ROSUVASTATIN CALCIUM 40 MG PO TABS
40.0000 mg | ORAL_TABLET | Freq: Every day | ORAL | 1 refills | Status: DC
Start: 2018-01-02 — End: 2018-02-10

## 2018-01-02 MED ORDER — LISINOPRIL 20 MG PO TABS: ORAL_TABLET | ORAL | 1 refills | Status: DC

## 2018-01-02 MED ORDER — METOPROLOL TARTRATE 50 MG PO TABS: 50.0000 mg | ORAL_TABLET | Freq: Two times a day (BID) | ORAL | 1 refills | Status: DC

## 2018-01-02 NOTE — Telephone Encounter (Signed)
Pt's medications were sent to pt's pharmacy as requested. Confirmation received.  

## 2018-01-02 NOTE — Telephone Encounter (Signed)
°*  STAT* If patient is at the pharmacy, call can be transferred to refill team.   1. Which medications need to be refilled? (please list name of each medication and dose if known)  Lisinopril 25mg  Metoprolol  50mg  Rosuvastatin   40mg  Nitroglyn    .4mg  Methocarbamol   500mg  Acetaminophen  500mg     2. Which pharmacy/location (including street and city if local pharmacy) is medication to be sent to? [facility]/[address]  3. Do they need a 30 day or 90 day supply? 90    Pt don't have any medications

## 2018-01-04 ENCOUNTER — Encounter: Payer: Self-pay | Admitting: *Deleted

## 2018-01-04 NOTE — Progress Notes (Deleted)
Cardiology Office Note Date:  01/04/2018  Patient ID:  Michael [name], DOB 10/05/1960, MRN [id number] PCP:  [name], Olugbemiga E, MD  Cardiologist:  Dr. [name]  ***refresh   Chief Complaint: *** ??  History of Present Illness: Michael EhlersGary S [name] is a 57 y.o. male with history of CAD (status post STEMI treated with DES to the proximal LAD 03/31/11.  He had diffuse distal disease in the right PLA and the third OM was occluded and filled from left to left collaterals.), HTN, HLD, pre-DM, and CRI.  He comes in today to be seen for Dr. [name].  He was last seen by cardiology service 06/28/17, by Leda GauzeM. Lenze, PA-C, he was doing well at that time, having had a ED visit for what was felt to have been GI.  No changes were made to his regime, and commented that his PMD followed and managed his lipids, he was to have a 1 year f/u.  *** why an early appt *** symptoms ***meds/CAD ***labs/lipids, PMD *** no echo ono file   Past Medical History:  Diagnosis Date  . Alcohol abuse   . Anterior myocardial infarction [facility](HCC)    ST-elevation; S/P emergent  drug-eluting stenting of proximal left anterior descending  . Coronary artery disease    a. STEMI 2012 - s/p DES to LAD with  diffuse distal disease in the right posterolateral branch. The third OM was occluded and filled from left to left collaterals. LVEF preserved.  . Hyperlipidemia   . Hypertension   . Pre-diabetes   . Renal insufficiency     Past Surgical History:  Procedure Laterality Date  . COLONOSCOPY N/A 08/16/2014   Procedure: COLONOSCOPY;  Surgeon: West BaliSandi L Fields, MD;  [facility];  Service: Endoscopy;  Laterality: N/A;  10:15 AM  . DOPPLER ECHOCARDIOGRAPHY     Preserved left venticular function  . LEG SURGERY     left leg - has rod and 4 pins  . PATELLAR TENDON REPAIR Right 04/26/2014   Procedure: RIGHT PATELLA TENDON REPAIR;  Surgeon: Sheral Apleyimothy D Murphy, MD;  Location: [facility];  Service:  Orthopedics;  Laterality: Right;    Current Outpatient Medications  Medication Sig Dispense Refill  . acetaminophen (ACETAMINOPHEN EXTRA STRENGTH) 500 MG tablet Take 2 tablets (1,000 mg total) by mouth every 8 (eight) hours as needed for moderate pain. 60 tablet 3  . aspirin EC 81 MG tablet Take 81 mg by mouth daily.      Marland Kitchen. lisinopril (PRINIVIL,ZESTRIL) 20 MG tablet TAKE 1 TABLET BY MOUTH ONCE DAILY 90 tablet 1  . methocarbamol (ROBAXIN) 500 MG tablet Take 1 tablet (500 mg total) by mouth every 6 (six) hours as needed for muscle spasms. 60 tablet 3  . metoprolol tartrate (LOPRESSOR) 50 MG tablet Take 1 tablet (50 mg total) by mouth 2 (two) times daily. 180 tablet 1  . nitroGLYCERIN (NITROSTAT) 0.4 MG SL tablet Place 1 tablet (0.4 mg total) under the tongue every 5 (five) minutes as needed for chest pain. 25 tablet 4  . rosuvastatin (CRESTOR) 40 MG tablet Take 1 tablet (40 mg total) by mouth daily. 90 tablet 1   No current facility-administered medications for this visit.     Allergies:   Ivp dye [iodinated diagnostic agents]   Social History:  The patient  reports that he has never smoked. He has never used smokeless tobacco. He reports that he drinks alcohol. He reports that he does not use drugs.   Family History:  The patient's family history includes Cancer in his maternal aunt; Diabetes in his unknown relative; Heart disease in his unknown relative; Hyperlipidemia in his unknown relative.  ROS:  Please see the history of present illness.  All other systems are reviewed and otherwise negative.   PHYSICAL EXAM: *** VS:  There were no vitals taken for this visit. BMI: There is no height or weight on file to calculate BMI. Well nourished, well developed, in no acute distress  HEENT: normocephalic, atraumatic  Neck: no JVD, carotid bruits or masses Cardiac:  *** RRR; no significant murmurs, no rubs, or gallops Lungs:  *** CTA b/l, no wheezing, rhonchi or rales  Abd: soft,  nontender MS: no deformity or atrophy Ext: no edema  Skin: warm and dry, no rash Neuro:  No gross deficits appreciated Psych: euthymic mood, full affect  EKG:  Not done today ***  Cardiac cath 03/31/11: 1. Right coronary artery: The vessel is large in size and dominant.  There is a mild 20% disease in the mid segment with minor luminal  irregularities distally. The PDA is normal in size and free of  significant disease. First posterolateral branch is normal in size  with 40% proximal disease. The second posterolateral branch is  relatively large in size with 50% disease at the ostium followed by  a 70% lesion proximally and diffuse 80% disease distally. The RCA  gives collaterals to the left anterior descending artery as well as  OM-3.  2. Left main coronary artery: The vessel is normal in size without  significant disease.  3. Left anterior descending artery: The vessel is normal in size and  is occluded proximally a small septal branch. The distal LAD does  not have significant disease. The first diagonal branch is normal  to large in size and has 2 branches. The superior branch is  overall normal. The inferior branch has a 90% proximal disease.  4. Left circumflex artery: The vessel is normal in size and  nondominant. The first obtuse marginal is normal in size with a  mild proximal disease. The second OM is normal in size with no  significant disease. Third OM appears to be occluded with  collaterals supplied from the distal LAD and the right coronary  Artery.    Recent Labs: No results found for requested labs within last 8760 hours.  No results found for requested labs within last 8760 hours.   CrCl cannot be calculated (Patient's most recent lab result is older than the maximum 21 days allowed.).   Wt Readings from Last 3 Encounters:  07/19/17 219 lb (99.3 kg)  07/06/17 219 lb (99.3 kg)  06/28/17 219 lb 12.8 oz (99.7 kg)     Other studies reviewed: Additional  studies/records reviewed today include: summarized above  ASSESSMENT AND PLAN:  1. CAD     ***     On ASA, BB, statin tx  2. HTN     ***  3. HLD     *** monitored and managed with his PMD    Disposition: F/u with ***  Current medicines are reviewed at length with the patient today.  The patient did not have any concerns regarding medicines.  Norma Fredrickson, PA-C 01/04/2018 1:48 PM     [facility] [address] [place] [address] [phone] (office)  [phone] (fax)

## 2018-01-05 ENCOUNTER — Ambulatory Visit: Payer: Medicare Other | Admitting: Physician Assistant

## 2018-01-23 ENCOUNTER — Inpatient Hospital Stay (HOSPITAL_COMMUNITY): Payer: Medicare Other | Admitting: Certified Registered"

## 2018-01-23 ENCOUNTER — Encounter (HOSPITAL_COMMUNITY)
Admission: EM | Disposition: A | Discharge status: Rehabilitation Facility | Payer: Self-pay | Source: Home / Self Care | Attending: Vascular Surgery

## 2018-01-23 ENCOUNTER — Encounter (HOSPITAL_COMMUNITY): Payer: Self-pay | Admitting: Emergency Medicine

## 2018-01-23 ENCOUNTER — Inpatient Hospital Stay (HOSPITAL_COMMUNITY): Payer: Medicare Other

## 2018-01-23 ENCOUNTER — Inpatient Hospital Stay (HOSPITAL_COMMUNITY)
Admission: EM | Admit: 2018-01-23 | Discharge: 2018-01-27 | DRG: 252 | Disposition: A | Discharge status: Rehabilitation Facility | Payer: Medicare Other | Attending: Vascular Surgery | Admitting: Vascular Surgery

## 2018-01-23 ENCOUNTER — Ambulatory Visit (HOSPITAL_COMMUNITY): Payer: Medicare Other

## 2018-01-23 DIAGNOSIS — Z7901 Long term (current) use of anticoagulants: Secondary | ICD-10-CM | POA: Diagnosis not present

## 2018-01-23 DIAGNOSIS — I82441 Acute embolism and thrombosis of right tibial vein: Secondary | ICD-10-CM | POA: Diagnosis present

## 2018-01-23 DIAGNOSIS — I82411 Acute embolism and thrombosis of right femoral vein: Secondary | ICD-10-CM | POA: Diagnosis not present

## 2018-01-23 DIAGNOSIS — F429 Obsessive-compulsive disorder, unspecified: Secondary | ICD-10-CM | POA: Diagnosis not present

## 2018-01-23 DIAGNOSIS — Z01818 Encounter for other preprocedural examination: Secondary | ICD-10-CM

## 2018-01-23 DIAGNOSIS — I2699 Other pulmonary embolism without acute cor pulmonale: Secondary | ICD-10-CM | POA: Diagnosis present

## 2018-01-23 DIAGNOSIS — K5730 Diverticulosis of large intestine without perforation or abscess without bleeding: Secondary | ICD-10-CM | POA: Diagnosis not present

## 2018-01-23 DIAGNOSIS — G8918 Other acute postprocedural pain: Secondary | ICD-10-CM | POA: Diagnosis not present

## 2018-01-23 DIAGNOSIS — N289 Disorder of kidney and ureter, unspecified: Secondary | ICD-10-CM | POA: Diagnosis not present

## 2018-01-23 DIAGNOSIS — Y838 Other surgical procedures as the cause of abnormal reaction of the patient, or of later complication, without mention of misadventure at the time of the procedure: Secondary | ICD-10-CM | POA: Diagnosis not present

## 2018-01-23 DIAGNOSIS — D62 Acute posthemorrhagic anemia: Secondary | ICD-10-CM | POA: Diagnosis not present

## 2018-01-23 DIAGNOSIS — I749 Embolism and thrombosis of unspecified artery: Secondary | ICD-10-CM | POA: Diagnosis not present

## 2018-01-23 DIAGNOSIS — I252 Old myocardial infarction: Secondary | ICD-10-CM | POA: Diagnosis not present

## 2018-01-23 DIAGNOSIS — I480 Paroxysmal atrial fibrillation: Secondary | ICD-10-CM | POA: Diagnosis present

## 2018-01-23 DIAGNOSIS — Z7982 Long term (current) use of aspirin: Secondary | ICD-10-CM | POA: Diagnosis not present

## 2018-01-23 DIAGNOSIS — I739 Peripheral vascular disease, unspecified: Secondary | ICD-10-CM

## 2018-01-23 DIAGNOSIS — Z79899 Other long term (current) drug therapy: Secondary | ICD-10-CM | POA: Diagnosis not present

## 2018-01-23 DIAGNOSIS — Z01810 Encounter for preprocedural cardiovascular examination: Secondary | ICD-10-CM | POA: Diagnosis not present

## 2018-01-23 DIAGNOSIS — I9581 Postprocedural hypotension: Secondary | ICD-10-CM | POA: Diagnosis not present

## 2018-01-23 DIAGNOSIS — I82431 Acute embolism and thrombosis of right popliteal vein: Secondary | ICD-10-CM | POA: Diagnosis present

## 2018-01-23 DIAGNOSIS — I97638 Postprocedural hematoma of a circulatory system organ or structure following other circulatory system procedure: Secondary | ICD-10-CM | POA: Diagnosis not present

## 2018-01-23 DIAGNOSIS — I723 Aneurysm of iliac artery: Secondary | ICD-10-CM | POA: Diagnosis present

## 2018-01-23 DIAGNOSIS — R7303 Prediabetes: Secondary | ICD-10-CM | POA: Diagnosis present

## 2018-01-23 DIAGNOSIS — I998 Other disorder of circulatory system: Secondary | ICD-10-CM | POA: Diagnosis present

## 2018-01-23 DIAGNOSIS — G629 Polyneuropathy, unspecified: Secondary | ICD-10-CM | POA: Diagnosis present

## 2018-01-23 DIAGNOSIS — K5903 Drug induced constipation: Secondary | ICD-10-CM | POA: Diagnosis not present

## 2018-01-23 DIAGNOSIS — I2510 Atherosclerotic heart disease of native coronary artery without angina pectoris: Secondary | ICD-10-CM | POA: Diagnosis present

## 2018-01-23 DIAGNOSIS — Z91041 Radiographic dye allergy status: Secondary | ICD-10-CM | POA: Diagnosis not present

## 2018-01-23 DIAGNOSIS — M79609 Pain in unspecified limb: Secondary | ICD-10-CM

## 2018-01-23 DIAGNOSIS — R5381 Other malaise: Secondary | ICD-10-CM

## 2018-01-23 DIAGNOSIS — Z955 Presence of coronary angioplasty implant and graft: Secondary | ICD-10-CM | POA: Diagnosis not present

## 2018-01-23 DIAGNOSIS — K5900 Constipation, unspecified: Secondary | ICD-10-CM | POA: Diagnosis not present

## 2018-01-23 DIAGNOSIS — I10 Essential (primary) hypertension: Secondary | ICD-10-CM | POA: Diagnosis not present

## 2018-01-23 DIAGNOSIS — E785 Hyperlipidemia, unspecified: Secondary | ICD-10-CM | POA: Diagnosis present

## 2018-01-23 DIAGNOSIS — N281 Cyst of kidney, acquired: Secondary | ICD-10-CM

## 2018-01-23 DIAGNOSIS — I70203 Unspecified atherosclerosis of native arteries of extremities, bilateral legs: Secondary | ICD-10-CM | POA: Diagnosis present

## 2018-01-23 DIAGNOSIS — I82401 Acute embolism and thrombosis of unspecified deep veins of right lower extremity: Secondary | ICD-10-CM | POA: Diagnosis not present

## 2018-01-23 DIAGNOSIS — M7989 Other specified soft tissue disorders: Secondary | ICD-10-CM

## 2018-01-23 DIAGNOSIS — T402X5A Adverse effect of other opioids, initial encounter: Secondary | ICD-10-CM | POA: Diagnosis not present

## 2018-01-23 DIAGNOSIS — N182 Chronic kidney disease, stage 2 (mild): Secondary | ICD-10-CM | POA: Diagnosis not present

## 2018-01-23 DIAGNOSIS — L03115 Cellulitis of right lower limb: Secondary | ICD-10-CM | POA: Diagnosis not present

## 2018-01-23 DIAGNOSIS — I824Z1 Acute embolism and thrombosis of unspecified deep veins of right distal lower extremity: Secondary | ICD-10-CM

## 2018-01-23 DIAGNOSIS — M7981 Nontraumatic hematoma of soft tissue: Secondary | ICD-10-CM | POA: Diagnosis not present

## 2018-01-23 DIAGNOSIS — R252 Cramp and spasm: Secondary | ICD-10-CM | POA: Diagnosis not present

## 2018-01-23 DIAGNOSIS — I70209 Unspecified atherosclerosis of native arteries of extremities, unspecified extremity: Secondary | ICD-10-CM | POA: Diagnosis not present

## 2018-01-23 HISTORY — DX: Other cervical disc displacement, unspecified cervical region: M50.20

## 2018-01-23 HISTORY — PX: FEMORAL-POPLITEAL BYPASS GRAFT: SHX937

## 2018-01-23 HISTORY — DX: Essential (primary) hypertension: I10

## 2018-01-23 HISTORY — DX: Frequency of micturition: R35.0

## 2018-01-23 HISTORY — PX: EMBOLECTOMY: SHX44

## 2018-01-23 HISTORY — DX: Other disorder of circulatory system: I99.8

## 2018-01-23 HISTORY — DX: Lower abdominal pain, unspecified: R10.30

## 2018-01-23 HISTORY — DX: Dysuria: R30.0

## 2018-01-23 HISTORY — PX: ENDARTERECTOMY POPLITEAL: SHX5806

## 2018-01-23 HISTORY — DX: Peripheral vascular disease, unspecified: I73.9

## 2018-01-23 HISTORY — DX: Encounter for screening for malignant neoplasm of colon: Z12.11

## 2018-01-23 LAB — SURGICAL PCR SCREEN
MRSA, PCR: NEGATIVE
Staphylococcus aureus: NEGATIVE

## 2018-01-23 LAB — CBC WITH DIFFERENTIAL/PLATELET
ABS IMMATURE GRANULOCYTES: 0 10*3/uL (ref 0.0–0.1)
BASOS ABS: 0 10*3/uL (ref 0.0–0.1)
Basophils Relative: 0 %
Eosinophils Absolute: 0.2 10*3/uL (ref 0.0–0.7)
Eosinophils Relative: 2 %
HCT: 46.9 % (ref 39.0–52.0)
HEMOGLOBIN: 14.8 g/dL (ref 13.0–17.0)
Immature Granulocytes: 0 %
LYMPHS PCT: 13 %
Lymphs Abs: 1.2 10*3/uL (ref 0.7–4.0)
MCH: 27.2 pg (ref 26.0–34.0)
MCHC: 31.6 g/dL (ref 30.0–36.0)
MCV: 86.1 fL (ref 78.0–100.0)
MONO ABS: 0.9 10*3/uL (ref 0.1–1.0)
Monocytes Relative: 11 %
NEUTROS ABS: 6.5 10*3/uL (ref 1.7–7.7)
Neutrophils Relative %: 74 %
Platelets: 147 10*3/uL — ABNORMAL LOW (ref 150–400)
RBC: 5.45 MIL/uL (ref 4.22–5.81)
RDW: 13.2 % (ref 11.5–15.5)
WBC: 8.9 10*3/uL (ref 4.0–10.5)

## 2018-01-23 LAB — PROTIME-INR
INR: 1
PROTHROMBIN TIME: 13.1 s (ref 11.4–15.2)

## 2018-01-23 LAB — BASIC METABOLIC PANEL
ANION GAP: 8 (ref 5–15)
BUN: 21 mg/dL — ABNORMAL HIGH (ref 6–20)
CHLORIDE: 103 mmol/L (ref 98–111)
CO2: 24 mmol/L (ref 22–32)
Calcium: 9.1 mg/dL (ref 8.9–10.3)
Creatinine, Ser: 1.43 mg/dL — ABNORMAL HIGH (ref 0.61–1.24)
GFR calc Af Amer: >60 mL/min (ref >60)
GFR, EST NON AFRICAN AMERICAN: 53 mL/min — AB (ref >60)
GLUCOSE: 132 mg/dL — AB (ref 70–99)
POTASSIUM: 4.3 mmol/L (ref 3.5–5.1)
Sodium: 135 mmol/L (ref 135–145)

## 2018-01-23 LAB — ABO/RH: ABO/RH(D): B POS

## 2018-01-23 LAB — HEPARIN LEVEL (UNFRACTIONATED): HEPARIN UNFRACTIONATED: 0.82 [IU]/mL — AB (ref 0.30–0.70)

## 2018-01-23 SURGERY — BYPASS GRAFT FEMORAL-POPLITEAL ARTERY
Anesthesia: General | Laterality: Bilateral

## 2018-01-23 SURGERY — BYPASS GRAFT FEMORAL-POPLITEAL ARTERY
Anesthesia: General | Laterality: Right

## 2018-01-23 MED ORDER — HEPARIN SODIUM (PORCINE) 5000 UNIT/ML IJ SOLN
INTRAMUSCULAR | Status: AC
  Filled 2018-01-23: qty 1.2

## 2018-01-23 MED ORDER — GUAIFENESIN-DM 100-10 MG/5ML PO SYRP: 15.0000 mL | ORAL_SOLUTION | ORAL | Status: DC | PRN

## 2018-01-23 MED ORDER — 0.9 % SODIUM CHLORIDE (POUR BTL) OPTIME
TOPICAL | Status: DC | PRN
  Administered 2018-01-23: 2000 mL

## 2018-01-23 MED ORDER — ROCURONIUM BROMIDE 10 MG/ML (PF) SYRINGE
PREFILLED_SYRINGE | INTRAVENOUS | Status: AC
  Filled 2018-01-23: qty 30

## 2018-01-23 MED ORDER — ALUM & MAG HYDROXIDE-SIMETH 200-200-20 MG/5ML PO SUSP: 15.0000 mL | ORAL | Status: DC | PRN

## 2018-01-23 MED ORDER — ROCURONIUM BROMIDE 10 MG/ML (PF) SYRINGE
PREFILLED_SYRINGE | INTRAVENOUS | Status: DC | PRN
  Administered 2018-01-23: 60 mg via INTRAVENOUS
  Administered 2018-01-23 (×4): 20 mg via INTRAVENOUS

## 2018-01-23 MED ORDER — ONDANSETRON HCL 4 MG/2ML IJ SOLN
INTRAMUSCULAR | Status: AC
  Filled 2018-01-23: qty 2

## 2018-01-23 MED ORDER — IOPAMIDOL (ISOVUE-370) INJECTION 76%
100.0000 mL | Freq: Once | INTRAVENOUS | Status: AC | PRN
  Administered 2018-01-23: 100 mL via INTRAVENOUS

## 2018-01-23 MED ORDER — HEPARIN SODIUM (PORCINE) 1000 UNIT/ML IJ SOLN
INTRAMUSCULAR | Status: DC | PRN
  Administered 2018-01-23: 5000 [IU] via INTRAVENOUS

## 2018-01-23 MED ORDER — LACTATED RINGERS IV SOLN
INTRAVENOUS | Status: DC
  Administered 2018-01-23 (×5): via INTRAVENOUS

## 2018-01-23 MED ORDER — ALBUMIN HUMAN 5 % IV SOLN
INTRAVENOUS | Status: DC | PRN
  Administered 2018-01-23 (×3): via INTRAVENOUS

## 2018-01-23 MED ORDER — HYDRALAZINE HCL 20 MG/ML IJ SOLN: 5.0000 mg | INTRAMUSCULAR | Status: DC | PRN

## 2018-01-23 MED ORDER — SODIUM CHLORIDE 0.9 % IV SOLN
INTRAVENOUS | Status: DC | PRN
  Administered 2018-01-23: 500 mL

## 2018-01-23 MED ORDER — SODIUM CHLORIDE 0.9 % IV SOLN
INTRAVENOUS | Status: DC
  Administered 2018-01-23: 13:00:00 via INTRAVENOUS

## 2018-01-23 MED ORDER — PROPOFOL 10 MG/ML IV BOLUS
INTRAVENOUS | Status: DC | PRN
  Administered 2018-01-23: 140 mg via INTRAVENOUS

## 2018-01-23 MED ORDER — SODIUM CHLORIDE 0.9 % IV SOLN
INTRAVENOUS | Status: DC | PRN
  Administered 2018-01-23: 23:00:00 via INTRAVENOUS
  Administered 2018-01-23: 30 ug/min via INTRAVENOUS

## 2018-01-23 MED ORDER — CEFAZOLIN SODIUM-DEXTROSE 2-4 GM/100ML-% IV SOLN
INTRAVENOUS | Status: AC
  Filled 2018-01-23: qty 100

## 2018-01-23 MED ORDER — ONDANSETRON HCL 4 MG/2ML IJ SOLN
4.0000 mg | Freq: Once | INTRAMUSCULAR | Status: AC
  Administered 2018-01-23: 4 mg via INTRAVENOUS
  Filled 2018-01-23: qty 2

## 2018-01-23 MED ORDER — POTASSIUM CHLORIDE CRYS ER 20 MEQ PO TBCR
20.0000 meq | EXTENDED_RELEASE_TABLET | Freq: Once | ORAL | Status: DC
  Filled 2018-01-23: qty 1

## 2018-01-23 MED ORDER — METHYLPREDNISOLONE SODIUM SUCC 125 MG IJ SOLR
125.0000 mg | INTRAMUSCULAR | Status: AC
  Administered 2018-01-23: 125 mg via INTRAVENOUS
  Filled 2018-01-23: qty 2

## 2018-01-23 MED ORDER — CEFAZOLIN SODIUM-DEXTROSE 2-4 GM/100ML-% IV SOLN
2.0000 g | Freq: Once | INTRAVENOUS | Status: AC
  Administered 2018-01-23 (×2): 2 g via INTRAVENOUS

## 2018-01-23 MED ORDER — LABETALOL HCL 5 MG/ML IV SOLN: 10.0000 mg | INTRAVENOUS | Status: DC | PRN

## 2018-01-23 MED ORDER — DIPHENHYDRAMINE HCL 50 MG/ML IJ SOLN
50.0000 mg | Freq: Once | INTRAMUSCULAR | Status: AC
  Administered 2018-01-23: 50 mg via INTRAVENOUS
  Filled 2018-01-23: qty 1

## 2018-01-23 MED ORDER — DEXAMETHASONE SODIUM PHOSPHATE 10 MG/ML IJ SOLN
INTRAMUSCULAR | Status: AC
  Filled 2018-01-23: qty 1

## 2018-01-23 MED ORDER — HEPARIN (PORCINE) IN NACL 100-0.45 UNIT/ML-% IJ SOLN
1450.0000 [IU]/h | INTRAMUSCULAR | Status: DC
  Administered 2018-01-23: 1550 [IU]/h via INTRAVENOUS
  Administered 2018-01-23: 23:00:00 via INTRAVENOUS
  Administered 2018-01-24: 1100 [IU]/h via INTRAVENOUS
  Administered 2018-01-25: 1000 [IU]/h via INTRAVENOUS
  Administered 2018-01-26: 1300 [IU]/h via INTRAVENOUS
  Administered 2018-01-27: 1450 [IU]/h via INTRAVENOUS
  Filled 2018-01-23 (×6): qty 250

## 2018-01-23 MED ORDER — LIDOCAINE 2% (20 MG/ML) 5 ML SYRINGE
INTRAMUSCULAR | Status: AC
Start: 2018-01-23 — End: ?
  Filled 2018-01-23: qty 15

## 2018-01-23 MED ORDER — POLYETHYLENE GLYCOL 3350 17 G PO PACK
17.0000 g | PACK | Freq: Every day | ORAL | Status: DC | PRN
  Administered 2018-01-27: 17 g via ORAL
  Filled 2018-01-23: qty 1

## 2018-01-23 MED ORDER — OXYCODONE-ACETAMINOPHEN 5-325 MG PO TABS
1.0000 | ORAL_TABLET | ORAL | Status: DC | PRN
  Administered 2018-01-24 – 2018-01-27 (×7): 2 via ORAL
  Filled 2018-01-23 (×7): qty 2

## 2018-01-23 MED ORDER — HEPARIN BOLUS VIA INFUSION
4500.0000 [IU] | Freq: Once | INTRAVENOUS | Status: AC
  Administered 2018-01-23: 4500 [IU] via INTRAVENOUS
  Filled 2018-01-23: qty 4500

## 2018-01-23 MED ORDER — ONDANSETRON HCL 4 MG/2ML IJ SOLN
INTRAMUSCULAR | Status: AC
Start: 2018-01-23 — End: ?
  Filled 2018-01-23: qty 4

## 2018-01-23 MED ORDER — ONDANSETRON HCL 4 MG/2ML IJ SOLN: 4.0000 mg | Freq: Four times a day (QID) | INTRAMUSCULAR | Status: DC | PRN

## 2018-01-23 MED ORDER — LIDOCAINE 2% (20 MG/ML) 5 ML SYRINGE
INTRAMUSCULAR | Status: DC | PRN
  Administered 2018-01-23: 60 mg via INTRAVENOUS

## 2018-01-23 MED ORDER — HYDROMORPHONE HCL 1 MG/ML IJ SOLN
0.2500 mg | INTRAMUSCULAR | Status: DC | PRN
  Administered 2018-01-24 (×2): 0.5 mg via INTRAVENOUS

## 2018-01-23 MED ORDER — HEMOSTATIC AGENTS (NO CHARGE) OPTIME
TOPICAL | Status: DC | PRN
  Administered 2018-01-23 – 2018-01-24 (×2): 1 via TOPICAL

## 2018-01-23 MED ORDER — ACETAMINOPHEN 325 MG PO TABS: 325.0000 mg | ORAL_TABLET | ORAL | Status: DC | PRN

## 2018-01-23 MED ORDER — OXYCODONE-ACETAMINOPHEN 5-325 MG PO TABS
1.0000 | ORAL_TABLET | ORAL | Status: DC | PRN
  Administered 2018-01-23: 1 via ORAL
  Filled 2018-01-23: qty 1

## 2018-01-23 MED ORDER — MUPIROCIN 2 % EX OINT
TOPICAL_OINTMENT | CUTANEOUS | Status: AC
  Filled 2018-01-23: qty 22

## 2018-01-23 MED ORDER — DIPHENHYDRAMINE HCL 50 MG/ML IJ SOLN
50.0000 mg | INTRAMUSCULAR | Status: AC
  Administered 2018-01-23: 50 mg via INTRAVENOUS
  Filled 2018-01-23: qty 1

## 2018-01-23 MED ORDER — ACETAMINOPHEN 325 MG RE SUPP
325.0000 mg | RECTAL | Status: DC | PRN
  Filled 2018-01-23: qty 2

## 2018-01-23 MED ORDER — DEXAMETHASONE SODIUM PHOSPHATE 10 MG/ML IJ SOLN
INTRAMUSCULAR | Status: AC
  Filled 2018-01-23: qty 2

## 2018-01-23 MED ORDER — BISACODYL 10 MG RE SUPP: 10.0000 mg | Freq: Every day | RECTAL | Status: DC | PRN

## 2018-01-23 MED ORDER — PHENOL 1.4 % MT LIQD
1.0000 | OROMUCOSAL | Status: DC | PRN
  Filled 2018-01-23: qty 177

## 2018-01-23 MED ORDER — PROPOFOL 10 MG/ML IV BOLUS
INTRAVENOUS | Status: AC
  Filled 2018-01-23: qty 20

## 2018-01-23 MED ORDER — PHENYLEPHRINE 40 MCG/ML (10ML) SYRINGE FOR IV PUSH (FOR BLOOD PRESSURE SUPPORT)
PREFILLED_SYRINGE | INTRAVENOUS | Status: AC
  Filled 2018-01-23: qty 30

## 2018-01-23 MED ORDER — PANTOPRAZOLE SODIUM 40 MG PO TBEC
40.0000 mg | DELAYED_RELEASE_TABLET | Freq: Every day | ORAL | Status: DC
  Administered 2018-01-24 – 2018-01-27 (×4): 40 mg via ORAL
  Filled 2018-01-23 (×5): qty 1

## 2018-01-23 MED ORDER — MIDAZOLAM HCL 5 MG/5ML IJ SOLN
INTRAMUSCULAR | Status: DC | PRN
  Administered 2018-01-23: 2 mg via INTRAVENOUS

## 2018-01-23 MED ORDER — MORPHINE SULFATE (PF) 2 MG/ML IV SOLN: 2.0000 mg | INTRAVENOUS | Status: DC | PRN

## 2018-01-23 MED ORDER — FENTANYL CITRATE (PF) 250 MCG/5ML IJ SOLN
INTRAMUSCULAR | Status: AC
  Filled 2018-01-23: qty 5

## 2018-01-23 MED ORDER — CEFAZOLIN SODIUM 1 G IJ SOLR
INTRAMUSCULAR | Status: AC
  Filled 2018-01-23: qty 20

## 2018-01-23 MED ORDER — METOPROLOL TARTRATE 5 MG/5ML IV SOLN: 2.0000 mg | INTRAVENOUS | Status: DC | PRN

## 2018-01-23 MED ORDER — HEPARIN BOLUS VIA INFUSION
4000.0000 [IU] | Freq: Once | INTRAVENOUS | Status: DC
  Filled 2018-01-23: qty 4000

## 2018-01-23 MED ORDER — FENTANYL CITRATE (PF) 250 MCG/5ML IJ SOLN
INTRAMUSCULAR | Status: DC | PRN
  Administered 2018-01-23 – 2018-01-24 (×5): 50 ug via INTRAVENOUS

## 2018-01-23 MED ORDER — MIDAZOLAM HCL 2 MG/2ML IJ SOLN
INTRAMUSCULAR | Status: AC
  Filled 2018-01-23: qty 2

## 2018-01-23 MED ORDER — SODIUM CHLORIDE 0.9 % IV BOLUS
1000.0000 mL | Freq: Once | INTRAVENOUS | Status: AC
  Administered 2018-01-23: 1000 mL via INTRAVENOUS

## 2018-01-23 SURGICAL SUPPLY — 66 items
ADH SKN CLS APL DERMABOND .7 (GAUZE/BANDAGES/DRESSINGS) ×2
AGENT HMST SPONGE THK3/8 (HEMOSTASIS) ×2
CANISTER SUCT 3000ML PPV (MISCELLANEOUS) ×3 IMPLANT
CANNULA VESSEL 3MM 2 BLNT TIP (CANNULA) ×6 IMPLANT
CATH EMB 2FR 60CM (CATHETERS) ×1 IMPLANT
CATH EMB 3FR 80CM (CATHETERS) ×2 IMPLANT
CATH EMB 4FR 80CM (CATHETERS) ×1 IMPLANT
CLIP VESOCCLUDE MED 24/CT (CLIP) ×3 IMPLANT
CLIP VESOCCLUDE SM WIDE 24/CT (CLIP) ×3 IMPLANT
CONT SPEC 4OZ CLIKSEAL STRL BL (MISCELLANEOUS) ×1 IMPLANT
DERMABOND ADVANCED (GAUZE/BANDAGES/DRESSINGS) ×1
DERMABOND ADVANCED .7 DNX12 (GAUZE/BANDAGES/DRESSINGS) IMPLANT
DRAIN SNY WOU (WOUND CARE) IMPLANT
DRAPE HALF SHEET 40X57 (DRAPES) IMPLANT
DRAPE X-RAY CASS 24X20 (DRAPES) IMPLANT
DRSG COVADERM 4X6 (GAUZE/BANDAGES/DRESSINGS) ×1 IMPLANT
DRSG COVADERM 4X8 (GAUZE/BANDAGES/DRESSINGS) ×2 IMPLANT
ELECT REM PT RETURN 9FT ADLT (ELECTROSURGICAL) ×3
ELECTRODE REM PT RTRN 9FT ADLT (ELECTROSURGICAL) ×2 IMPLANT
EVACUATOR SILICONE 100CC (DRAIN) IMPLANT
GLOVE BIO SURGEON STRL SZ 6.5 (GLOVE) ×1 IMPLANT
GLOVE BIO SURGEON STRL SZ7.5 (GLOVE) ×3 IMPLANT
GLOVE BIOGEL M STRL SZ7.5 (GLOVE) ×1 IMPLANT
GLOVE BIOGEL PI IND STRL 7.0 (GLOVE) IMPLANT
GLOVE BIOGEL PI IND STRL 7.5 (GLOVE) IMPLANT
GLOVE BIOGEL PI IND STRL 8 (GLOVE) IMPLANT
GLOVE BIOGEL PI INDICATOR 7.0 (GLOVE) ×2
GLOVE BIOGEL PI INDICATOR 7.5 (GLOVE) ×3
GLOVE BIOGEL PI INDICATOR 8 (GLOVE) ×1
GOWN STRL REUS W/ TWL LRG LVL3 (GOWN DISPOSABLE) ×6 IMPLANT
GOWN STRL REUS W/ TWL XL LVL3 (GOWN DISPOSABLE) IMPLANT
GOWN STRL REUS W/TWL LRG LVL3 (GOWN DISPOSABLE) ×15
GOWN STRL REUS W/TWL XL LVL3 (GOWN DISPOSABLE) ×3
GRAFT PROPATEN THIN WALL 6X80 (Vascular Products) ×1 IMPLANT
HEMOSTAT SPONGE AVITENE ULTRA (HEMOSTASIS) ×1 IMPLANT
KIT BASIN OR (CUSTOM PROCEDURE TRAY) ×3 IMPLANT
KIT TURNOVER KIT B (KITS) ×3 IMPLANT
NS IRRIG 1000ML POUR BTL (IV SOLUTION) ×6 IMPLANT
PACK PERIPHERAL VASCULAR (CUSTOM PROCEDURE TRAY) ×3 IMPLANT
PAD ARMBOARD 7.5X6 YLW CONV (MISCELLANEOUS) ×6 IMPLANT
POWDER SURGICEL 3.0 GRAM (HEMOSTASIS) ×1 IMPLANT
STAPLER VISISTAT 35W (STAPLE) ×2 IMPLANT
STOPCOCK 4 WAY LG BORE MALE ST (IV SETS) ×1 IMPLANT
SUT PROLENE 5 0 C 1 24 (SUTURE) ×3 IMPLANT
SUT PROLENE 6 0 CC (SUTURE) ×6 IMPLANT
SUT PROLENE 7 0 BV 1 (SUTURE) ×1 IMPLANT
SUT PROLENE 7 0 BV1 MDA (SUTURE) ×2 IMPLANT
SUT SILK 2 0 SH (SUTURE) ×3 IMPLANT
SUT SILK 3 0 (SUTURE) ×6
SUT SILK 3-0 18XBRD TIE 12 (SUTURE) IMPLANT
SUT VIC AB 2-0 CT1 27 (SUTURE) ×9
SUT VIC AB 2-0 CT1 TAPERPNT 27 (SUTURE) IMPLANT
SUT VIC AB 2-0 SH 27 (SUTURE)
SUT VIC AB 2-0 SH 27XBRD (SUTURE) ×4 IMPLANT
SUT VIC AB 3-0 SH 27 (SUTURE) ×9
SUT VIC AB 3-0 SH 27X BRD (SUTURE) ×8 IMPLANT
SUT VIC AB 4-0 PS2 18 (SUTURE) ×3 IMPLANT
SUT VIC AB 4-0 PS2 27 (SUTURE) ×4 IMPLANT
SYR 3ML LL SCALE MARK (SYRINGE) ×1 IMPLANT
SYRINGE 3CC LL L/F (MISCELLANEOUS) ×3 IMPLANT
TAPE UMBILICAL COTTON 1/8X30 (MISCELLANEOUS) ×3 IMPLANT
TOWEL GREEN STERILE (TOWEL DISPOSABLE) ×3 IMPLANT
TRAY FOLEY MTR SLVR 16FR STAT (SET/KITS/TRAYS/PACK) ×3 IMPLANT
TUBING EXTENTION W/L.L. (IV SETS) IMPLANT
UNDERPAD 30X30 (UNDERPADS AND DIAPERS) ×3 IMPLANT
WATER STERILE IRR 1000ML POUR (IV SOLUTION) ×3 IMPLANT

## 2018-01-23 NOTE — Anesthesia Preprocedure Evaluation (Addendum)
Anesthesia Evaluation  Patient identified by MRN, date of birth, ID band Patient awake    Reviewed: Allergy & Precautions, H&P , NPO status , Patient's Chart, lab work & pertinent test results, reviewed documented beta blocker date and time   Airway Mallampati: III  TM Distance: >3 FB Neck ROM: Full    Dental no notable dental hx. (+) Partial Upper, Dental Advisory Given   Pulmonary neg pulmonary ROS,    Pulmonary exam normal breath sounds clear to auscultation       Cardiovascular hypertension, On Medications and On Home Beta Blockers + CAD, + Past MI, + Cardiac Stents and + Peripheral Vascular Disease   Rhythm:Regular Rate:Normal     Neuro/Psych negative neurological ROS  negative psych ROS   GI/Hepatic negative GI ROS, Neg liver ROS,   Endo/Other  negative endocrine ROS  Renal/GU Renal InsufficiencyRenal disease  negative genitourinary   Musculoskeletal   Abdominal   Peds  Hematology negative hematology ROS (+)   Anesthesia Other Findings   Reproductive/Obstetrics negative OB ROS                            Anesthesia Physical Anesthesia Plan  ASA: III  Anesthesia Plan: General   Post-op Pain Management:    Induction: Intravenous  PONV Risk Score and Plan: 3 and Ondansetron, Dexamethasone and Midazolam  Airway Management Planned: Oral ETT  Additional Equipment:   Intra-op Plan:   Post-operative Plan: Extubation in OR  Informed Consent: I have reviewed the patients History and Physical, chart, labs and discussed the procedure including the risks, benefits and alternatives for the proposed anesthesia with the patient or authorized representative who has indicated his/her understanding and acceptance.   Dental advisory given  Plan Discussed with: CRNA  Anesthesia Plan Comments:         Anesthesia Quick Evaluation

## 2018-01-23 NOTE — Progress Notes (Addendum)
ANTICOAGULATION CONSULT NOTE - Initial Consult  Pharmacy Consult for heparin Indication: DVT/ ischemic limb  Allergies  Allergen Reactions  . Ivp Dye [Iodinated Diagnostic Agents] Swelling    Patient Measurements: Height: 5' 8.5" (174 cm) Weight: 212 lb (96.2 kg) IBW/kg (Calculated) : 69.55 Heparin Dosing Weight: 89.7 Kg  Vital Signs: Temp: 98.9 F (37.2 C) (08/12 0830) Temp Source: Oral (08/12 0830) BP: 133/91 (08/12 1030) Pulse Rate: 59 (08/12 1036)  Labs: Recent Labs    01/23/18 0944  HGB 14.8  HCT 46.9  PLT 147*  LABPROT 13.1  INR 1.00  CREATININE 1.43*    Estimated Creatinine Clearance: 64.7 mL/min (A) (by C-G formula based on SCr of 1.43 mg/dL (H)).   Medical History: Past Medical History:  Diagnosis Date  . Alcohol abuse   . Anterior myocardial infarction [facility](HCC)    ST-elevation; S/P emergent  drug-eluting stenting of proximal left anterior descending  . Coronary artery disease    a. STEMI 2012 - s/p DES to LAD with  diffuse distal disease in the right posterolateral branch. The third OM was occluded and filled from left to left collaterals. LVEF preserved.  . Hyperlipidemia   . Hypertension   . Pre-diabetes   . Renal insufficiency     Assessment: [age] yoM here with ischemic lower extremity. No anticoagulation reported PTA. Hgb 14.8, Platelets slightly low at 147. Pharmacy to dose heparin.  Goal of Therapy:  Heparin level 0.3-0.7 units/ml Monitor platelets by anticoagulation protocol: Yes   Plan:  Give 4500 units bolus x 1 Start heparin infusion at 1550 units/hr (~17 units/kg) Check anti-Xa level in 6 hours and daily while on heparin Continue to monitor H&H and platelets  Ruben Imony Andre Gallego, PharmD Clinical Pharmacist 01/23/2018 10:46 AM Please check AMION for all [facility] numbers

## 2018-01-23 NOTE — Progress Notes (Signed)
Right lower extremity venous duplex has been completed. There is evidence of acute deep vein thrombosis involving he external iliac, common femoral, femoral, popliteal, posterior tibial, and peroneal veins of the right lower extremity. Incidental findings of acute occlusion of the common femoral, superficial femoral, popliteal, and posterior tibial arteries of the right lower extremity. Results were given to Dr. Charm BargesButler.   01/23/18 10:37 AM Michael CordialGreg Jayliana Valencia RVT

## 2018-01-23 NOTE — ED Notes (Signed)
Pt to CT at this time.

## 2018-01-23 NOTE — H&P (Signed)
Referring Physician: Dr Meridee ScoreMichael Butler  Patient name: Michael EhlersGary S [name] MRN: [id number] DOB: 10/22/1960 Sex: male  REASON FOR CONSULT: DVT right leg with possible arterial compromise  HPI: Michael [name] is a 57 y.o. male, with 2-3 day history of worsening numbness tingling right first toe now extending up the right leg.  He has no wounds on the foot.  He is able to move the foot but can't bear weight.  He has a history of calf tightness in the left leg but this does not really sound like claudication.  He has no history of afib.  Other medical problems include hyperlipidemia hypertension renal insufficiency.  All of which are currently stable.  He has a history of CAD with MI 2011 no chest pain currently but has dyspnea with less than one flight of stairs or less than a block walking.  Has history of contrast reaction with total body swelling and itching with his cardiac cath.  He is compliant with his aspirin and statin.   Past Medical History:  Diagnosis Date  . Alcohol abuse   . Anterior myocardial infarction [facility](HCC)    ST-elevation; S/P emergent  drug-eluting stenting of proximal left anterior descending  . Coronary artery disease    a. STEMI 2012 - s/p DES to LAD with  diffuse distal disease in the right posterolateral branch. The third OM was occluded and filled from left to left collaterals. LVEF preserved.  . Hyperlipidemia   . Hypertension   . Pre-diabetes   . Renal insufficiency    Past Surgical History:  Procedure Laterality Date  . COLONOSCOPY N/A 08/16/2014   Procedure: COLONOSCOPY;  Surgeon: West BaliSandi L Varonica Siharath, MD;  [facility];  Service: Endoscopy;  Laterality: N/A;  10:15 AM  . DOPPLER ECHOCARDIOGRAPHY     Preserved left venticular function  . LEG SURGERY     left leg - has rod and 4 pins  . PATELLAR TENDON REPAIR Right 04/26/2014   Procedure: RIGHT PATELLA TENDON REPAIR;  Surgeon: Sheral Apleyimothy D Murphy, MD;  Location: [facility];  Service:  Orthopedics;  Laterality: Right;    Family History  Problem Relation Age of Onset  . Cancer Maternal Aunt   . Hyperlipidemia Unknown   . Diabetes Unknown   . Heart disease Unknown     SOCIAL HISTORY: Social History   Socioeconomic History  . Marital status: Significant Other    Spouse name: Not on file  . Number of children: 0  . Years of education: Not on file  . Highest education level: Not on file  Occupational History  . Not on file  Social Needs  . Financial resource strain: Not on file  . Food insecurity:    Worry: Not on file    Inability: Not on file  . Transportation needs:    Medical: Not on file    Non-medical: Not on file  Tobacco Use  . Smoking status: Never Smoker  . Smokeless tobacco: Never Used  Substance and Sexual Activity  . Alcohol use: Yes    Comment: Drinks occassionally  . Drug use: No  . Sexual activity: Not on file  Lifestyle  . Physical activity:    Days per week: Not on file    Minutes per session: Not on file  . Stress: Not on file  Relationships  . Social connections:    Talks on phone: Not on file    Gets together: Not on file    Attends religious  service: Not on file    Active member of club or organization: Not on file    Attends meetings of clubs or organizations: Not on file    Relationship status: Not on file  . Intimate partner violence:    Fear of current or ex partner: Not on file    Emotionally abused: Not on file    Physically abused: Not on file    Forced sexual activity: Not on file  Other Topics Concern  . Not on file  Social History Narrative  . Not on file    Allergies  Allergen Reactions  . Ivp Dye [Iodinated Diagnostic Agents] Swelling    Current Facility-Administered Medications  Medication Dose Route Frequency Provider Last Rate Last Dose  . diphenhydrAMINE (BENADRYL) injection 50 mg  50 mg Intravenous STAT Shogo Larkey E, MD      . methylPREDNISolone sodium succinate (SOLU-MEDROL) 125 mg/2 mL  injection 125 mg  125 mg Intravenous STAT Lillyahna Hemberger, Janetta Horaharles E, MD      . oxyCODONE-acetaminophen (PERCOCET/ROXICET) 5-325 MG per tablet 1 tablet  1 tablet Oral Q30 min PRN Terrilee FilesButler, Michael C, MD   1 tablet at 01/23/18 0846  . sodium chloride 0.9 % bolus 1,000 mL  1,000 mL Intravenous Once Terrilee FilesButler, Michael C, MD 984 mL/hr at 01/23/18 1044 1,000 mL at 01/23/18 1044   Current Outpatient Medications  Medication Sig Dispense Refill  . acetaminophen (ACETAMINOPHEN EXTRA STRENGTH) 500 MG tablet Take 2 tablets (1,000 mg total) by mouth every 8 (eight) hours as needed for moderate pain. 60 tablet 3  . aspirin EC 81 MG tablet Take 81 mg by mouth daily.      Marland Kitchen. lisinopril (PRINIVIL,ZESTRIL) 20 MG tablet TAKE 1 TABLET BY MOUTH ONCE DAILY 90 tablet 1  . methocarbamol (ROBAXIN) 500 MG tablet Take 1 tablet (500 mg total) by mouth every 6 (six) hours as needed for muscle spasms. 60 tablet 3  . metoprolol tartrate (LOPRESSOR) 50 MG tablet Take 1 tablet (50 mg total) by mouth 2 (two) times daily. 180 tablet 1  . nitroGLYCERIN (NITROSTAT) 0.4 MG SL tablet Place 1 tablet (0.4 mg total) under the tongue every 5 (five) minutes as needed for chest pain. 25 tablet 4  . rosuvastatin (CRESTOR) 40 MG tablet Take 1 tablet (40 mg total) by mouth daily. 90 tablet 1    ROS:   General:  No weight loss, Fever, chills  HEENT: No recent headaches, no nasal bleeding, no visual changes, no sore throat  Neurologic: No dizziness, blackouts, seizures. No recent symptoms of stroke or mini- stroke. No recent episodes of slurred speech, or temporary blindness.  Cardiac: No recent episodes of chest pain/pressure, no shortness of breath at rest.  No shortness of breath with exertion.  Denies history of atrial fibrillation or irregular heartbeat  Vascular: No history of rest pain in feet.  No history of claudication.  No history of non-healing ulcer, No history of DVT   Pulmonary: No home oxygen, no productive cough, no hemoptysis,  No  asthma or wheezing  Musculoskeletal:  [ ]  Arthritis, [ ]  Low back pain,  [X]  Joint pain  Hematologic:No history of hypercoagulable state.  No history of easy bleeding.  No history of anemia  Gastrointestinal: No hematochezia or melena,  No gastroesophageal reflux, no trouble swallowing  Urinary: [X]  chronic Kidney disease, [ ]  on HD - [ ]  MWF or [ ]  TTHS, [ ]  Burning with urination, [ ]  Frequent urination, [ ]  Difficulty urinating;   Skin:  No rashes  Psychological: No history of anxiety,  No history of depression   Physical Examination  Vitals:   01/23/18 1030 01/23/18 1032 01/23/18 1036 01/23/18 1039  BP: (!) 133/91     Pulse: (!) 58 (!) 56 (!) 59   Resp:   14   Temp:      TempSrc:      SpO2: 96% 99% 97%   Weight:    96.2 kg  Height:    5' 8.5" (1.74 m)    Body mass index is 31.77 kg/m.  General:  Alert and oriented, no acute distress HEENT: Normal Neck: No JVD Pulmonary: Clear to auscultation bilaterally Cardiac: Regular Rate and Rhythm Abdomen: Soft, non-tender, non-distended, no mass, no scars Skin: No rash or ulcer, feet symmetrically warm, right foot pink left foot pale Extremity Pulses:  2+ radial, brachial, femoral, absent popliteal dorsalis pedis, posterior tibial pulses bilaterally.  Has PT doppler right foot stronger PT doppler left leg, no DP signal bilaterally Musculoskeletal: No deformity trace edema right leg  Neurologic: Upper and lower extremity motor 5/5 and symmetric  DATA:  Duplex right leg DVT femoral to foot.  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 Start heparin now  2.  CTA abdomen pelvis with runoff to further clarify extent of arterial disease, will premed with medrol and benadryl  3. Admit to 4E telemetry.  Keep NPO for now.   [name], MD [facility] [place] Office: [phone] Pager:  [phone]

## 2018-01-23 NOTE — ED Provider Notes (Signed)
[facility] EMERGENCY DEPARTMENT Provider Note   CSN: [id number] Arrival date & time: 01/23/18  2841     History   Chief Complaint Chief Complaint  Patient presents with  . Leg Pain    HPI Michael [name] is a 57 y.o. male.  He is a history of coronary artery disease along with hypertension and hyperlipidemia.  He comes in today complaining of right leg pain that started yesterday.  Says this started in his toes that has moved to his ankle and now it is all the way up through his thigh.  He feels it swollen and tingly.  He rates the pain as throbbing and moderate in intensity.  No history of any trauma.  He has hardware in his left leg and he had a knee surgery on the right.  No prior history gout.  No fevers no chills no chest pain no shortness of breath.  He was given an oxycodone in triage which is helped the pain.  The history is provided by the patient.  Leg Pain   This is a new problem. The current episode started yesterday. The problem occurs constantly. The problem has been gradually worsening. The pain is present in the left upper leg, left lower leg, left knee, left ankle and left foot. The quality of the pain is described as aching. The pain is moderate. Associated symptoms include tingling. The symptoms are aggravated by activity. He has tried rest for the symptoms. The treatment provided mild relief. There has been no history of extremity trauma.    Past Medical History:  Diagnosis Date  . Alcohol abuse   . Anterior myocardial infarction [facility])    ST-elevation; S/P emergent  drug-eluting stenting of proximal left anterior descending  . Coronary artery disease    a. STEMI 2012 - s/p DES to LAD with  diffuse distal disease in the right posterolateral branch. The third OM was occluded and filled from left to left collaterals. LVEF preserved.  . Hyperlipidemia   . Hypertension   . Pre-diabetes   . Renal insufficiency     Patient Active Problem List   Diagnosis Date Noted  . Frequency of urination and polyuria 07/06/2017  . Erectile dysfunction due to diseases classified elsewhere 10/27/2016  . PAD (peripheral artery disease) (HCC) 08/11/2016  . Nocturia 08/11/2016  . Chronic pain of both knees 04/01/2016  . Palpitations 05/05/2015  . Hyperlipidemia 05/05/2015  . HNP (herniated nucleus pulposus), cervical 10/14/2014  . Chronic low back pain 10/14/2014  . Numbness and tingling of both upper extremities while sleeping 10/14/2014  . Frequent urination 09/03/2014  . Lower abdominal pain 09/03/2014  . Special screening for malignant neoplasms, colon   . Cervical stenosis of spine 05/16/2014  . Essential hypertension 05/16/2014  . Numbness and tingling of right arm 11/28/2013  . Dental abscess 04/26/2013  . Dysuria 04/26/2013  . HTN (hypertension) 04/04/2013  . CAD (coronary artery disease) 05/25/2011    Past Surgical History:  Procedure Laterality Date  . COLONOSCOPY N/A 08/16/2014   Procedure: COLONOSCOPY;  Surgeon: West Bali, MD;  [facility];  Service: Endoscopy;  Laterality: N/A;  10:15 AM  . DOPPLER ECHOCARDIOGRAPHY     Preserved left venticular function  . LEG SURGERY     left leg - has rod and 4 pins  . PATELLAR TENDON REPAIR Right 04/26/2014   Procedure: RIGHT PATELLA TENDON REPAIR;  Surgeon: Sheral Apley, MD;  Location: [facility];  Service:  Orthopedics;  Laterality: Right;        Home Medications    Prior to Admission medications   Medication Sig Start Date End Date Taking? Authorizing Provider  acetaminophen (ACETAMINOPHEN EXTRA STRENGTH) 500 MG tablet Take 2 tablets (1,000 mg total) by mouth every 8 (eight) hours as needed for moderate pain. 03/23/17   [name], Olugbemiga E, MD  aspirin EC 81 MG tablet Take 81 mg by mouth daily.      [provider]  lisinopril (PRINIVIL,ZESTRIL) 20 MG tablet TAKE 1 TABLET BY MOUTH ONCE DAILY 01/02/18   Kathleene HazelMcAlhany, Christopher D, MD    methocarbamol (ROBAXIN) 500 MG tablet Take 1 tablet (500 mg total) by mouth every 6 (six) hours as needed for muscle spasms. 03/23/17   [name], Olugbemiga E, MD  metoprolol tartrate (LOPRESSOR) 50 MG tablet Take 1 tablet (50 mg total) by mouth 2 (two) times daily. 01/02/18   Kathleene HazelMcAlhany, Christopher D, MD  nitroGLYCERIN (NITROSTAT) 0.4 MG SL tablet Place 1 tablet (0.4 mg total) under the tongue every 5 (five) minutes as needed for chest pain. 01/02/18   Kathleene HazelMcAlhany, Christopher D, MD  rosuvastatin (CRESTOR) 40 MG tablet Take 1 tablet (40 mg total) by mouth daily. 01/02/18   Kathleene HazelMcAlhany, Christopher D, MD    Family History Family History  Problem Relation Age of Onset  . Cancer Maternal Aunt   . Hyperlipidemia Unknown   . Diabetes Unknown   . Heart disease Unknown     Social History Social History   Tobacco Use  . Smoking status: Never Smoker  . Smokeless tobacco: Never Used  Substance Use Topics  . Alcohol use: Yes    Comment: Drinks occassionally  . Drug use: No     Allergies   Ivp dye [iodinated diagnostic agents]   Review of Systems Review of Systems  Constitutional: Negative for fever.  HENT: Negative for sore throat.   Eyes: Negative for visual disturbance.  Respiratory: Negative for shortness of breath.   Cardiovascular: Negative for chest pain.  Gastrointestinal: Negative for abdominal pain.  Genitourinary: Negative for dysuria.  Musculoskeletal: Positive for gait problem.  Skin: Negative for rash.  Neurological: Positive for tingling.     Physical Exam Updated Vital Signs BP (!) 152/95 (BP Location: Left Arm)   Pulse 68   Temp 98.9 F (37.2 C) (Oral)   Resp 18   SpO2 100%   Physical Exam  Constitutional: He appears well-developed and well-nourished.  HENT:  Head: Normocephalic and atraumatic.  Eyes: Conjunctivae are normal.  Neck: Neck supple.  Cardiovascular: Normal rate, regular rhythm and normal heart sounds.  Pulmonary/Chest: Effort normal and breath  sounds normal. No stridor. He has no wheezes. He has no rales.  Abdominal: Soft. He exhibits no mass. There is no tenderness. There is no guarding.  Musculoskeletal: Normal range of motion. He exhibits edema. He exhibits no deformity.  He has diffuse tenderness through his ankle and thigh.  He has some redness and some warmth to it.  He is got palpable femoral and popliteal pulses but no signal at his DP or PT.  His foot is still warm and pink.  Neurological: He is alert. GCS eye subscore is 4. GCS verbal subscore is 5. GCS motor subscore is 6.  Skin: Skin is warm and dry.  Psychiatric: He has a normal mood and affect.  Nursing note and vitals reviewed.    ED Treatments / Results  Labs (all labs ordered are listed, but only abnormal results are displayed) Labs  Reviewed  BASIC METABOLIC PANEL - Abnormal; Notable for the following components:      Result Value   Glucose, Bld 132 (*)    BUN 21 (*)    Creatinine, Ser 1.43 (*)    GFR calc non Af Amer 53 (*)    All other components within normal limits  CBC WITH DIFFERENTIAL/PLATELET - Abnormal; Notable for the following components:   Platelets 147 (*)    All other components within normal limits  PROTIME-INR  HEPARIN LEVEL (UNFRACTIONATED)  URINALYSIS, ROUTINE W REFLEX MICROSCOPIC  HEPARIN LEVEL (UNFRACTIONATED)  CBC  PROTIME-INR  BASIC METABOLIC PANEL  HEMOGLOBIN A1C    EKG EKG Interpretation  Date/Time:  Monday January 23 2018 10:35:53 EDT Ventricular Rate:  55 PR Interval:    QRS Duration: 89 QT Interval:  461 QTC Calculation: 441 R Axis:   20 Text Interpretation:  Sinus rhythm Minimal ST elevation, anterior leads improved from prior 10/12 Confirmed by Meridee Score [phone]) on 01/23/2018 10:46:56 AM Also confirmed by Meridee Score [phone])  on 01/23/2018 10:56:35 AM Also confirmed by Meridee Score [phone]), editor Sheppard Evens [phone])  on 01/23/2018 2:04:13 PM   Radiology Dg Chest 2 View  Result Date:  01/23/2018 CLINICAL DATA:  Right leg swelling since last night EXAM: CHEST - 2 VIEW COMPARISON:  None. FINDINGS: The heart size and mediastinal contours are within normal limits. The lung volumes are low. Both lungs are clear. The visualized skeletal structures are unremarkable. IMPRESSION: No active cardiopulmonary disease. Electronically Signed   By: Sherian Rein M.D.   On: 01/23/2018 12:08    Procedures .Critical Care Performed by: Terrilee Files, MD Authorized by: Terrilee Files, MD   Critical care provider statement:    Critical care time (minutes):  30   Critical care was necessary to treat or prevent imminent or life-threatening deterioration of the following conditions:  Circulatory failure   Critical care was time spent personally by me on the following activities:  Discussions with consultants, evaluation of patient's response to treatment, examination of patient, ordering and performing treatments and interventions, ordering and review of laboratory studies, ordering and review of radiographic studies, pulse oximetry, re-evaluation of patient's condition, obtaining history from patient or surrogate, review of old charts and development of treatment plan with patient or surrogate   I assumed direction of critical care for this patient from another provider in my specialty: no     (including critical care time)  Medications Ordered in ED Medications  heparin ADULT infusion 100 units/mL (25000 units/261mL sodium chloride 0.45%) (1,550 Units/hr Intravenous New Bag/Given 01/23/18 1111)  potassium chloride SA (K-DUR,KLOR-CON) CR tablet 20-40 mEq (20 mEq Oral Not Given 01/23/18 1237)  ondansetron (ZOFRAN) injection 4 mg (has no administration in time range)  alum & mag hydroxide-simeth (MAALOX/MYLANTA) 200-200-20 MG/5ML suspension 15-30 mL (has no administration in time range)  pantoprazole (PROTONIX) EC tablet 40 mg (40 mg Oral Not Given 01/23/18 1237)  labetalol (NORMODYNE,TRANDATE)  injection 10 mg (has no administration in time range)  hydrALAZINE (APRESOLINE) injection 5 mg (has no administration in time range)  metoprolol tartrate (LOPRESSOR) injection 2-5 mg (has no administration in time range)  guaiFENesin-dextromethorphan (ROBITUSSIN DM) 100-10 MG/5ML syrup 15 mL (has no administration in time range)  phenol (CHLORASEPTIC) mouth spray 1 spray (has no administration in time range)  0.9 %  sodium chloride infusion ( Intravenous New Bag/Given 01/23/18 1238)  acetaminophen (TYLENOL) tablet 325-650 mg (has no administration in time range)    Or  acetaminophen (TYLENOL) suppository 325-650 mg (has no administration in time range)  oxyCODONE-acetaminophen (PERCOCET/ROXICET) 5-325 MG per tablet 1-2 tablet (has no administration in time range)  morphine 2 MG/ML injection 2 mg (has no administration in time range)  polyethylene glycol (MIRALAX / GLYCOLAX) packet 17 g (has no administration in time range)  bisacodyl (DULCOLAX) suppository 10 mg (has no administration in time range)  ondansetron (ZOFRAN) injection 4 mg (4 mg Intravenous Given 01/23/18 1028)  sodium chloride 0.9 % bolus 1,000 mL (0 mLs Intravenous Stopped 01/23/18 1237)  diphenhydrAMINE (BENADRYL) injection 50 mg (50 mg Intravenous Given 01/23/18 1051)  methylPREDNISolone sodium succinate (SOLU-MEDROL) 125 mg/2 mL injection 125 mg (125 mg Intravenous Given 01/23/18 1051)  heparin bolus via infusion 4,500 Units (4,500 Units Intravenous Bolus from Bag 01/23/18 1112)  diphenhydrAMINE (BENADRYL) injection 50 mg (50 mg Intravenous Given 01/23/18 1633)  iopamidol (ISOVUE-370) 76 % injection 100 mL (100 mLs Intravenous Contrast Given 01/23/18 1713)     Initial Impression / Assessment and Plan / ED Course  I have reviewed the triage vital signs and the nursing notes.  Pertinent labs & imaging results that were available during my care of the patient were reviewed by me and considered in my medical decision making (see chart  for details).  Clinical Course as of Jan 24 1715  Mon Jan 23, 2018  [id number] 57 year old male with known CAD and multiple cardiac risk factors here with new right leg pain in the setting of no trauma.  He has some swelling and some erythema and tenderness on exam.  We will put him in for a venous duplex and also seen if they can get ABIs as I cannot get a signal in his foot.  He is also getting some basic lab work done.   [MB]  1041 Vascular has informed me that the patient has an extensive DVT from the femoral on down but also has no evidence of any arterial flow.  I talked to Dr. Darrick Pennafields from vascular surgery who is putting him in for CT Angie of the limb but also asked for him to be started on some heparin per protocol.  He will evaluate the patient in the ED.   [MB]  1046 Patient has allergy to IV dye.  Dr. Darrick Pennafields is ordered the CT but I see he is also put him in for Solu-Medrol and Benadryl so it sounds like this is being covered.   [MB]  1106 Dr. Darrick PennaFields down to see the patient and was able to get some Doppler signal in his foot.  They will be admitting to their service.   [MB]    Clinical Course User Index [MB] Terrilee FilesButler, Michael C, MD     Final Clinical Impressions(s) / ED Diagnoses   Final diagnoses:  Lower leg DVT (deep venous thromboembolism), acute, right (HCC)  PVD (peripheral vascular disease) [facility](HCC)    ED Discharge Orders    None       Terrilee FilesButler, Michael C, MD 01/23/18 1718

## 2018-01-23 NOTE — ED Notes (Signed)
Pt to CT with transport.

## 2018-01-23 NOTE — ED Triage Notes (Signed)
Patient complains of right leg pain x2 days. States pain alleviated by elevation, but pain is too severe to bear weight. Denies recent injury. Unable to palpate pulse in right foot. Patient reports history of decreased circulation in lower extremities. Right leg noticeably swollen versus left.  Patient alert, oriented, and in no apparent distress at this time.

## 2018-01-23 NOTE — Anesthesia Procedure Notes (Signed)
Procedure Name: Intubation Date/Time: 01/23/2018 7:28 PM Performed by: Myna Bright, CRNA Pre-anesthesia Checklist: Patient identified, Emergency Drugs available, Suction available and Patient being monitored Patient Re-evaluated:Patient Re-evaluated prior to induction Oxygen Delivery Method: Circle system utilized Preoxygenation: Pre-oxygenation with 100% oxygen Induction Type: IV induction Ventilation: Mask ventilation without difficulty Laryngoscope Size: Mac and 4 Grade View: Grade I Tube type: Oral Tube size: 7.5 mm Number of attempts: 1 Airway Equipment and Method: Stylet Placement Confirmation: ETT inserted through vocal cords under direct vision,  positive ETCO2 and breath sounds checked- equal and bilateral Secured at: 22 cm Tube secured with: Tape Dental Injury: Teeth and Oropharynx as per pre-operative assessment

## 2018-01-23 NOTE — Progress Notes (Signed)
CT shows occlusion from right SFA all the way down and left popliteal all the way down as well as PE.   Still complains of right foot numbness just big toe on left.  Motor function is intact   Will take to OR for bilateral embolectomies  Procedure risks benefits discussed  Fabienne Brunsharles Tariya Morrissette, MD [facility] [place] Office: [phone] Pager: [phone]

## 2018-01-24 ENCOUNTER — Inpatient Hospital Stay (HOSPITAL_COMMUNITY): Payer: Medicare Other

## 2018-01-24 ENCOUNTER — Encounter (HOSPITAL_COMMUNITY): Payer: Self-pay | Admitting: Vascular Surgery

## 2018-01-24 LAB — CBC
HCT: 35.3 % — ABNORMAL LOW (ref 39.0–52.0)
HCT: 33.3 % — ABNORMAL LOW (ref 39.0–52.0)
HEMOGLOBIN: 10.1 g/dL — AB (ref 13.0–17.0)
Hemoglobin: 11.1 g/dL — ABNORMAL LOW (ref 13.0–17.0)
MCH: 27.8 pg (ref 26.0–34.0)
MCH: 27.4 pg (ref 26.0–34.0)
MCHC: 31.4 g/dL (ref 30.0–36.0)
MCHC: 30.3 g/dL (ref 30.0–36.0)
MCV: 88.3 fL (ref 78.0–100.0)
MCV: 90.2 fL (ref 78.0–100.0)
Platelets: 144 10*3/uL — ABNORMAL LOW (ref 150–400)
Platelets: 147 10*3/uL — ABNORMAL LOW (ref 150–400)
RBC: 4 MIL/uL — AB (ref 4.22–5.81)
RBC: 3.69 MIL/uL — ABNORMAL LOW (ref 4.22–5.81)
RDW: 13.4 % (ref 11.5–15.5)
RDW: 13.7 % (ref 11.5–15.5)
WBC: 11.9 10*3/uL — AB (ref 4.0–10.5)
WBC: 11.1 10*3/uL — ABNORMAL HIGH (ref 4.0–10.5)

## 2018-01-24 LAB — BASIC METABOLIC PANEL
ANION GAP: 9 (ref 5–15)
BUN: 12 mg/dL (ref 6–20)
CALCIUM: 8.4 mg/dL — AB (ref 8.9–10.3)
CO2: 24 mmol/L (ref 22–32)
CREATININE: 1.27 mg/dL — AB (ref 0.61–1.24)
Chloride: 106 mmol/L (ref 98–111)
Glucose, Bld: 149 mg/dL — ABNORMAL HIGH (ref 70–99)
Potassium: 4.4 mmol/L (ref 3.5–5.1)
Sodium: 139 mmol/L (ref 135–145)

## 2018-01-24 LAB — HEMOGLOBIN A1C
Hgb A1c MFr Bld: 6.2 % — ABNORMAL HIGH (ref 4.8–5.6)
MEAN PLASMA GLUCOSE: 131.24 mg/dL

## 2018-01-24 LAB — HEPARIN LEVEL (UNFRACTIONATED)
Heparin Unfractionated: 1.09 IU/mL — ABNORMAL HIGH (ref 0.30–0.70)
Heparin Unfractionated: 0.72 IU/mL — ABNORMAL HIGH (ref 0.30–0.70)

## 2018-01-24 LAB — PROTIME-INR
INR: 1.19
PROTHROMBIN TIME: 15 s (ref 11.4–15.2)

## 2018-01-24 LAB — TROPONIN I: Troponin I: <0.03 ng/mL (ref <0.03)

## 2018-01-24 MED ORDER — SODIUM CHLORIDE 0.9 % IV BOLUS
1000.0000 mL | Freq: Once | INTRAVENOUS | Status: AC
  Administered 2018-01-24: 1000 mL via INTRAVENOUS

## 2018-01-24 MED ORDER — HYDROMORPHONE HCL 1 MG/ML IJ SOLN
INTRAMUSCULAR | Status: AC
  Filled 2018-01-24: qty 1

## 2018-01-24 MED ORDER — ONDANSETRON HCL 4 MG/2ML IJ SOLN
INTRAMUSCULAR | Status: DC | PRN
  Administered 2018-01-24: 4 mg via INTRAVENOUS

## 2018-01-24 MED ORDER — DEXAMETHASONE SODIUM PHOSPHATE 10 MG/ML IJ SOLN
INTRAMUSCULAR | Status: DC | PRN
  Administered 2018-01-24: 10 mg via INTRAVENOUS

## 2018-01-24 MED ORDER — PROPOFOL 10 MG/ML IV BOLUS
INTRAVENOUS | Status: AC
  Filled 2018-01-24: qty 20

## 2018-01-24 MED ORDER — MAGNESIUM SULFATE 2 GM/50ML IV SOLN: 2.0000 g | Freq: Every day | INTRAVENOUS | Status: DC | PRN

## 2018-01-24 MED ORDER — SODIUM CHLORIDE 0.9 % IV SOLN
INTRAVENOUS | Status: DC
  Administered 2018-01-24 (×2): via INTRAVENOUS

## 2018-01-24 MED ORDER — DOCUSATE SODIUM 100 MG PO CAPS
100.0000 mg | ORAL_CAPSULE | Freq: Every day | ORAL | Status: DC
  Administered 2018-01-25 – 2018-01-27 (×3): 100 mg via ORAL
  Filled 2018-01-24 (×3): qty 1

## 2018-01-24 MED ORDER — SUGAMMADEX SODIUM 200 MG/2ML IV SOLN
INTRAVENOUS | Status: DC | PRN
  Administered 2018-01-24: 200 mg via INTRAVENOUS

## 2018-01-24 MED ORDER — POTASSIUM CHLORIDE CRYS ER 20 MEQ PO TBCR: 20.0000 meq | EXTENDED_RELEASE_TABLET | Freq: Every day | ORAL | Status: DC | PRN

## 2018-01-24 MED ORDER — CEFAZOLIN SODIUM-DEXTROSE 2-4 GM/100ML-% IV SOLN
2.0000 g | Freq: Three times a day (TID) | INTRAVENOUS | Status: AC
  Administered 2018-01-24 (×2): 2 g via INTRAVENOUS
  Filled 2018-01-24 (×2): qty 100

## 2018-01-24 MED ORDER — SODIUM CHLORIDE 0.9 % IV SOLN: 500.0000 mL | Freq: Once | INTRAVENOUS | Status: DC | PRN

## 2018-01-24 MED ORDER — HEPARIN SODIUM (PORCINE) 1000 UNIT/ML IJ SOLN
INTRAMUSCULAR | Status: AC
  Filled 2018-01-24: qty 2

## 2018-01-24 MED ORDER — MORPHINE SULFATE (PF) 2 MG/ML IV SOLN
2.0000 mg | INTRAVENOUS | Status: DC | PRN
  Administered 2018-01-24 (×2): 2 mg via INTRAVENOUS
  Administered 2018-01-24: 4 mg via INTRAVENOUS
  Administered 2018-01-24 (×3): 2 mg via INTRAVENOUS
  Administered 2018-01-25 (×2): 4 mg via INTRAVENOUS
  Filled 2018-01-24: qty 1
  Filled 2018-01-24 (×2): qty 2
  Filled 2018-01-24: qty 1
  Filled 2018-01-24 (×2): qty 2
  Filled 2018-01-24 (×3): qty 1
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
## 2018-01-24 NOTE — Progress Notes (Signed)
Patient to [facility] from PACU. Patient given CHG bath. Placed on telemetry. A&O x 4. Patient oriented to unit. Bed placed in lowest position and call bell within reach. Will continue to monitor. Victorino DecemberGarnet A Roise Emert, RN

## 2018-01-24 NOTE — Progress Notes (Signed)
ANTICOAGULATION CONSULT NOTE - Follow Up Consult  Pharmacy Consult for Heparin Indication: Ischemic limb  Allergies  Allergen Reactions  . Ivp Dye [Iodinated Diagnostic Agents] Swelling    Patient Measurements: Height: 5\' 8"  (172.7 cm) Weight: 214 lb 15.2 oz (97.5 kg) IBW/kg (Calculated) : 68.4 Heparin Dosing Weight: 89.1 kg  Vital Signs: Temp: 98.7 F (37.1 C) (08/13 1337) Temp Source: Oral (08/13 1337) BP: 109/73 (08/13 1720) Pulse Rate: 79 (08/13 1720)  Labs: Recent Labs    01/23/18 0944 01/23/18 1832 01/24/18 0421 01/24/18 0921 01/24/18 1639  HGB 14.8  --  11.1* 10.1*  --   HCT 46.9  --  35.3* 33.3*  --   PLT 147*  --  144* 147*  --   LABPROT 13.1  --  15.0  --   --   INR 1.00  --  1.19  --   --   HEPARINUNFRC  --  0.82*  --  1.09* 0.72*  CREATININE 1.43*  --  1.27*  --   --   TROPONINI  --   --   --  <0.03  --     Estimated Creatinine Clearance: 72.6 mL/min (A) (by C-G formula based on SCr of 1.27 mg/dL (H)).  Assessment:  Anticoag: heparin for ischemic limb, s/p OR 8/12 for embolectomy/bypass.  PM heparin level = 0.72   Goal of Therapy:  Heparin level 0.3-0.7 units/ml Monitor platelets by anticoagulation protocol: Yes   Plan:  Dec heparin to 1000 units/hr Daily CBC/HL Monitor for bleeding F/u to resume home meds  Thank you Okey RegalLisa Jago Carton, PharmD [phone] 01/24/2018,5:27 PM

## 2018-01-24 NOTE — Progress Notes (Signed)
Dr. Darrick PennaFields notified of patient's BP's. New orders received.   Ernestina ColumbiaK. Starr Tahari Clabaugh, RN

## 2018-01-24 NOTE — Transfer of Care (Signed)
Immediate Anesthesia Transfer of Care Note  Patient: Michael [name]  Procedure(s) Performed: BYPASS GRAFT LEFT FEMORAL-POPLITEAL ARTERY WITH PROPATEN VASCULAR GRAFT (Left ) ENDARTERECTOMY OF LEFT POPLITEAL ARTERY AND TIBIAL-PERONEAL TRUNK (Left ) RIGHT POPLITEAL-TIBIAL  ARTERY EMBOLECTOMY, EXPLORATION OF RIGHT ANTERIOR TIBIAL ARTERY, RIGHT POPLITEAL ARTERY VEIN ANGIOPLASTY (Right )  Patient Location: PACU  Anesthesia Type:General  Level of Consciousness: awake, alert  and oriented  Airway & Oxygen Therapy: Patient connected to face mask oxygen  Post-op Assessment: Report given to RN, Post -op Vital signs reviewed and stable and Patient moving all extremities X 4  Post vital signs: Reviewed and stable  Last Vitals:  Vitals Value Taken Time  BP 166/101 01/24/2018 12:57 AM  Temp    Pulse 82 01/24/2018  1:00 AM  Resp 16 01/24/2018  1:00 AM  SpO2 99 % 01/24/2018  1:00 AM  Vitals shown include unvalidated device data.  Last Pain:  Vitals:   01/23/18 1744  TempSrc:   PainSc: 10-Worst pain ever      Patients Stated Pain Goal: 4 (01/23/18 1744)  Complications: No apparent anesthesia complications

## 2018-01-24 NOTE — Significant Event (Signed)
Rapid Response Event Note  Overview: Time Called: 0851 Arrival Time: [id number] Event Type: Hypotension  Initial Focused Assessment: Received call from nurse for patient with symptomatic hypotension POD #1 fem-pop. Patient in bed talking and laughing on mobile phone upon my arrival. Patient denies symptoms at this time. Stated he felt dizzy after drinking orange juice, but feels ok now. Patient normal in appearance; warm, dry, alert and oriented. Assessment benign.  Interventions: Orthostatic blood pressures done while PT in room.  Monitored patient during PT session to assess for changes. Feet warm and dry, pulses present. No signs of reocclusion at this time. Currently receiving bolus ordered by MD for hypotension.  Plan of Care (if not transferred): Continue to monitor for changes. Call prn Event Summary: Name of Physician Notified: Dr Darrick PennaFields at [phone]    at    Outcome: Stayed in room and stabalized  Event End Time: 0915  Alroy DustWilla E Conrado Nance
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
## 2018-01-24 NOTE — Anesthesia Postprocedure Evaluation (Signed)
Anesthesia Post Note  Patient: Michael [name]  Procedure(s) Performed: BYPASS GRAFT LEFT FEMORAL-POPLITEAL ARTERY WITH PROPATEN VASCULAR GRAFT (Left ) ENDARTERECTOMY OF LEFT POPLITEAL ARTERY AND TIBIAL-PERONEAL TRUNK (Left ) RIGHT POPLITEAL-TIBIAL  ARTERY EMBOLECTOMY, EXPLORATION OF RIGHT ANTERIOR TIBIAL ARTERY, RIGHT POPLITEAL ARTERY VEIN ANGIOPLASTY (Right )     Patient location during evaluation: PACU Anesthesia Type: General Level of consciousness: awake and alert Pain management: pain level controlled Vital Signs Assessment: post-procedure vital signs reviewed and stable Respiratory status: spontaneous breathing, nonlabored ventilation, respiratory function stable and patient connected to nasal cannula oxygen Cardiovascular status: blood pressure returned to baseline and stable Postop Assessment: no apparent nausea or vomiting Anesthetic complications: no    Last Vitals:  Vitals:   01/24/18 0432 01/24/18 0520  BP: 116/79 115/82  Pulse:    Resp: 13 15  Temp: 36.6 C 36.7 C  SpO2: 100% 100%    Last Pain:  Vitals:   01/24/18 0520  TempSrc: Oral  PainSc: 7                  Jennice Renegar,W. EDMOND

## 2018-01-24 NOTE — Progress Notes (Signed)
ANTICOAGULATION CONSULT NOTE   Pharmacy Consult for Heparin  Indication: ischemic limb, s/p OR  Allergies  Allergen Reactions  . Ivp Dye [Iodinated Diagnostic Agents] Swelling    Patient Measurements: Height: 5\' 8"  (172.7 cm) Weight: 214 lb 15.2 oz (97.5 kg) IBW/kg (Calculated) : 68.4  Vital Signs: Temp: 98 F (36.7 C) (08/13 0327) Temp Source: Oral (08/13 0327) BP: 123/89 (08/13 0327) Pulse Rate: 78 (08/13 0327)  Labs: Recent Labs    01/23/18 0944 01/23/18 1832  HGB 14.8  --   HCT 46.9  --   PLT 147*  --   LABPROT 13.1  --   INR 1.00  --   HEPARINUNFRC  --  0.82*  CREATININE 1.43*  --     Estimated Creatinine Clearance: 64.5 mL/min (A) (by C-G formula based on SCr of 1.43 mg/dL (H)).   Medical History: Past Medical History:  Diagnosis Date  . Alcohol abuse   . Anterior myocardial infarction [facility](HCC)    ST-elevation; S/P emergent  drug-eluting stenting of proximal left anterior descending  . Coronary artery disease    a. STEMI 2012 - s/p DES to LAD with  diffuse distal disease in the right posterolateral branch. The third OM was occluded and filled from left to left collaterals. LVEF preserved.  . Hyperlipidemia   . Hypertension   . Pre-diabetes   . Renal insufficiency     Assessment: 57 y/o M with ischemic limb s/p embolectomy/bypass, consulted to continue heparin immediately post-op (which was done).   Just prior to OR, pt has an elevated heparin level of 0.82, will decrease post-op heparin drip some  Goal of Therapy:  Heparin level 0.3-0.7 units/ml Monitor platelets by anticoagulation protocol: Yes   Plan:  Dec heparin to 1400 units/hr Check heparin level at 1000 Daily CBC/HL Monitor for bleeding  Abran DukeLedford, Nuria Phebus 01/24/2018,4:08 AM

## 2018-01-24 NOTE — Progress Notes (Signed)
PT Cancellation Note  Patient Details Name: Michael EhlersGary S Gemmill MRN: [id number] DOB: 04/14/1961   Cancelled Treatment:    Reason Eval/Treat Not Completed: Medical issues which prohibited therapy. Per RN, pt with low BP. Requesting PT check back for evaluation. Will follow-up as schedule permits.  Ina HomesJaclyn Maximiliano Cromartie, PT, DPT Acute Rehab Services  Pager: [phone]  Malachy ChamberJaclyn L Hideo Googe 01/24/2018, 8:19 AM

## 2018-01-24 NOTE — Op Note (Signed)
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  The patient was placed in supine position on the operating table.  After induction of general anesthesia and endotracheal intubation, patient was prepped and draped in usual sterile fashion from the umbilicus down to the toes bilaterally.  Next a longitudinal incision was made on the medial aspect of the right calf carried through the subtenons tissues down the liver greater saphenous vein.  This was reflected anteriorly.  The incision was deepened down to the below-knee popliteal space.  The below-knee popliteal artery was dissected free circumferentially and a vessel loop placed around this.  There was significant edema fluid and vascular congestion from the patient's right leg DVT.  Dissection was carried down the level anterior tibial vein.  This was dissected free circumferentially and ligated and divided between silk ties.  The anterior tibial and peroneal trunk were both dissected free circumferentially and Vesseloops placed around this.  A longitudinal opening was made in the below-knee popliteal artery and there was fresh thrombus within this.  However, it was adherent suggesting that have been there for several days.  #4 Fogarty catheter was used to thrombectomize the proximal aspect of the artery.  This was passed all way up to the level of the common  femoral artery.  Multiple passes were made with return of a large amount of thrombotic and embolic type material.  This was sent to pathology as specimen.  Excellent arterial inflow was obtained.  The vessel was then controlled proximally with a vessel loop.  #3 Fogarty catheter was then passed down the proximal aspect of the anterior tibial artery and tibioperoneal trunk.  The catheter was only passed for about 10 cm and then abruptly would stop.  I was able to retrieve some thrombus from each of these vessels.  The thrombus retrieved was almost cast like again suggesting that have been present for several days.  There was some backbleeding from both vessels.  A segment of saphenous vein was then ligated proximally and distally opened longitudinally and sent on as a patch angioplasty using running 6-0 Prolene suture.  Is prior to completion of anastomosis it was forebled backbled and thoroughly flushed.  Anastomosis was secured clamps released there is pulsatile flow in the popliteal artery immediately.  There was a faint monophasic posterior tibial Doppler signal.  In an effort to try to improve perfusion to this foot.  I made an additional longitudinal incision over the anterior tibial artery on the dorsum of the ankle.  Incision was carried on through the subtendinous tissues.  Tendons were identified and separated medially and laterally.  Anterior tibial artery was located at the base of the incision.  Vesseloops were placed proximally distally.  Longitudinal opening was made in the anterior tibial artery and this had been completely obliterated and was chronically diseased.  At this point this was repaired with a running 7-0 Prolene suture.  The artery as most likely been occluded for a chronic.  For some time.  Attention  was then turned to the left leg.  In similar fashion a below-knee popliteal incision was made on the medial aspect the left leg.  The saphenous vein was reflected posteriorly.  Incision was  deepened in the below-knee popliteal space.  The popliteal artery was dissected free circumferentially.  It had no pulse within it.  The anterior tibial and tibioperoneal trunk were dissected free circumferentially and Vesseloops placed around these.  A longitudinal opening was made in the popliteal artery.  This was quite thickened and basically the lumen had been totally obliterated with chronic disease.  I was able to perform an endarterectomy of the below-knee popliteal artery and eversion of the origins of the anterior tibial and tibioperoneal trunk and got vigorous backbleeding from both of these.  At this point I decided to proceed with a femoral-popliteal bypass.  Additional longitudinal incision was made in the left groin carried through the subtendinous tissues down level left common femoral artery.  This was dissected free circumferentially.  Profunda femoris and superficial femoral arteries were dissected free circumferentially Vesseloops placed around these.  Patient was given an additional 5000 units of heparin.  He had been maintained on a heparin drip.  A longitudinal opening was made in the anterior surface of the common femoral artery a 6 mm propatent graft was brought through the deep subsartorial space from the below-knee incision to the groin.  This was spatulated and sewn end of graft to side of artery using a running 5-0 Prolene suture.  Just prior to completion anastomosis it was forebled backbled and thoroughly flushed.  Hemostasis was obtained with Gelfoam application.  Attention was turned the below-knee popliteal artery.  This required a fairly long distal anastomosis due to the previous longitudinal opening and endarterectomy.  The distal anastomosis was then created end of graft to side of artery using a running 6 oh and running 7-0 Prolene suture on the distal aspect.  Distal anastomosis was carried down onto the origin of the anterior tibial artery.  Prior to completion anastomosis it  was forebled backbled and thoroughly flushed.  Anastomosis was secured clamps released there is some bleeding from the toe and this was repaired with two 7-0 Prolene sutures.  After hemostasis was obtained the foot was inspected and found to have a biphasic posterior tibial Doppler signal.  There was no dorsalis pedis Doppler signal.  Hemostasis was obtained in both incisions.  The knee incision on both sides was closed with a running subcutaneous stitch with a 3-0 Vicryl.  The skin was closed with staples.  The dorsal ankle wound on the right leg was closed with staples.  The left groin was closed in multiple layers of running 2-0 and 3-0 Vicryl suture followed by 4-0 Vicryl subcuticular stitch in the skin and Dermabond on the skin as well.  The patient tolerated procedure well and there were complications.  The instrument sponge needle count was correct the end of the case.  The patient was taken to recovery room in stable condition.  Patient has severe tibial occlusive disease bilaterally.  If he reoccludes early most likely no re-intervention at this point especially for the right foot.  Fabienne Brunsharles Meranda Dechaine, MD [facility] [place] Office: [phone] Pager: [phone]

## 2018-01-24 NOTE — Progress Notes (Addendum)
ANTICOAGULATION CONSULT NOTE - Follow Up Consult  Pharmacy Consult for Heparin Indication: Ischemic limb  Allergies  Allergen Reactions  . Ivp Dye [Iodinated Diagnostic Agents] Swelling    Patient Measurements: Height: 5\' 8"  (172.7 cm) Weight: 214 lb 15.2 oz (97.5 kg) IBW/kg (Calculated) : 68.4 Heparin Dosing Weight: 89.1 kg  Vital Signs: Temp: 98.1 F (36.7 C) (08/13 0520) Temp Source: Oral (08/13 0520) BP: 103/70 (08/13 0912) Pulse Rate: 78 (08/13 0327)  Labs: Recent Labs    01/23/18 0944 01/23/18 1832 01/24/18 0421 01/24/18 0921  HGB 14.8  --  11.1* 10.1*  HCT 46.9  --  35.3* 33.3*  PLT 147*  --  144* 147*  LABPROT 13.1  --  15.0  --   INR 1.00  --  1.19  --   HEPARINUNFRC  --  0.82*  --  1.09*  CREATININE 1.43*  --  1.27*  --   TROPONINI  --   --   --  <0.03    Estimated Creatinine Clearance: 72.6 mL/min (A) (by C-G formula based on SCr of 1.27 mg/dL (H)).  Assessment:  Anticoag: heparin for ischemic limb, s/p OR 8/12 for embolectomy/bypass. Hgb 11.1>10.1 overnight. HL 0.82>1.09 up?  Goal of Therapy:  Heparin level 0.3-0.7 units/ml Monitor platelets by anticoagulation protocol: Yes   Plan:  Dec heparin to 1100 units/hr Daily CBC/HL Monitor for bleeding F/u to resume home meds  Amaal Dimartino S. Merilynn Finlandobertson, PharmD, BCPS Clinical Staff Pharmacist Pager [phone]  Misty Stanleyobertson, Landers Prajapati Stillinger 01/24/2018,10:37 AM

## 2018-01-24 NOTE — Progress Notes (Signed)
PT called while working with patient and BP had dropped to 79/59. Bolus started and Lianne CureMaureen Collins, PA notified. Per PA continue bolus and continue to monitor.  Ernestina ColumbiaK. Starr Allix Blomquist, RN

## 2018-01-24 NOTE — Progress Notes (Signed)
Patient concerned that he left his belongings on his original inpatient unit, [facility]. Called nurses station on [facility] to enquire about his belongings. I was told that they would try to locate any belongings left behind and get someone to bring them to [facility]. Will continue to monitor. Victorino DecemberGarnet A Skyleen Bentley, RN

## 2018-01-24 NOTE — Progress Notes (Signed)
Received report from PACU about patient coming to [facility] from surgery. Victorino DecemberGarnet A Linell Meldrum, RN

## 2018-01-24 NOTE — Progress Notes (Addendum)
[facility]  Subjective  - Doing well over all.  Legs feel better.   Objective 115/82 78 98.1 F (36.7 C) (Oral) 15 100%  Intake/Output Summary (Last 24 hours) at 01/24/2018 0801 Last data filed at 01/24/2018 0553 Gross per 24 hour  Intake 5904.86 ml  Output 1555 ml  Net 4349.86 ml    Monophasic left PT, biphasic right PT doppler signals.  Active range of motion of toes intact. Incisions healing well, left groin soft without hematoma. Heart RRR  Lungs non labored breathing  Assessment/Planning: POD # 1  Procedure: #1 right popliteal and tibial embolectomy #2 exploration right anterior tibial artery #3 left femoral to below-knee popliteal bypass (PTFE) #4 left popliteal and tibial endarterectomy  Anticoagulation heparin dose per pharmacy Monophasic left LE Biphasic right LE PT for mobility Discussion per DR. Idamay Hosein note: "If he reoccludes early most likely no re-intervention at this point especially for the right foot."  Patient aware that the right LE arterial flow is marginal.  CTA performed pre-op showed the left renal artery has a slightly beaded appearance and difficult to exclude fibromuscular dysplasia.  Radiology recommended f/u renal ultrasound.  He had a previous ultrasound ordered by DR. Jegede with [facility] 10/2016.  We will have him f/u with him as an outpatient for repeat renal Ultrasound.          Mosetta Pigeonmma Maureen Collins 01/24/2018 8:01 AM --   1156 am Pt with some hypotension this morning BP 70-80 systolic Hemoglobin drop 11-10 but no obvious hematoma Troponin pending Monophasic PT right biphasic PT monophasic DP left Discussed with pt that this is as good as it will get.  High risk for limb loss on right Continue heparin for PE Repeat fluid bolus one more time if still hypotensive  Fabienne Brunsharles Kathelyn Gombos, MD [facility] [place] Office: [phone] Pager:  [phone]  Laboratory Lab Results: Recent Labs    01/23/18 0944 01/24/18 0421  WBC 8.9 11.9*  HGB 14.8 11.1*  HCT 46.9 35.3*  PLT 147* 144*   BMET Recent Labs    01/23/18 0944 01/24/18 0421  NA 135 139  K 4.3 4.4  CL 103 106  CO2 24 24  GLUCOSE 132* 149*  BUN 21* 12  CREATININE 1.43* 1.27*  CALCIUM 9.1 8.4*    COAG Lab Results  Component Value Date   INR 1.19 01/24/2018   INR 1.00 01/23/2018   INR 0.92 03/31/2011   No results found for: PTT

## 2018-01-25 ENCOUNTER — Inpatient Hospital Stay (HOSPITAL_COMMUNITY): Payer: Medicare Other

## 2018-01-25 ENCOUNTER — Inpatient Hospital Stay (HOSPITAL_COMMUNITY): Payer: Medicare Other | Admitting: Anesthesiology

## 2018-01-25 ENCOUNTER — Encounter (HOSPITAL_COMMUNITY)
Admission: EM | Disposition: A | Discharge status: Rehabilitation Facility | Payer: Self-pay | Source: Home / Self Care | Attending: Vascular Surgery

## 2018-01-25 ENCOUNTER — Other Ambulatory Visit: Payer: Self-pay

## 2018-01-25 DIAGNOSIS — I97638 Postprocedural hematoma of a circulatory system organ or structure following other circulatory system procedure: Secondary | ICD-10-CM

## 2018-01-25 DIAGNOSIS — Z01810 Encounter for preprocedural cardiovascular examination: Secondary | ICD-10-CM

## 2018-01-25 HISTORY — PX: HEMATOMA EVACUATION: SHX5118

## 2018-01-25 LAB — BASIC METABOLIC PANEL
ANION GAP: 7 (ref 5–15)
BUN: 20 mg/dL (ref 6–20)
CALCIUM: 8.1 mg/dL — AB (ref 8.9–10.3)
CO2: 22 mmol/L (ref 22–32)
Chloride: 104 mmol/L (ref 98–111)
Creatinine, Ser: 1.55 mg/dL — ABNORMAL HIGH (ref 0.61–1.24)
GFR, EST AFRICAN AMERICAN: 56 mL/min — AB (ref >60)
GFR, EST NON AFRICAN AMERICAN: 48 mL/min — AB (ref >60)
Glucose, Bld: 137 mg/dL — ABNORMAL HIGH (ref 70–99)
Potassium: 3.6 mmol/L (ref 3.5–5.1)
Sodium: 133 mmol/L — ABNORMAL LOW (ref 135–145)

## 2018-01-25 LAB — CBC
HEMATOCRIT: 28.2 % — AB (ref 39.0–52.0)
Hemoglobin: 8.8 g/dL — ABNORMAL LOW (ref 13.0–17.0)
MCH: 27.8 pg (ref 26.0–34.0)
MCHC: 31.2 g/dL (ref 30.0–36.0)
MCV: 89.2 fL (ref 78.0–100.0)
PLATELETS: 161 10*3/uL (ref 150–400)
RBC: 3.16 MIL/uL — ABNORMAL LOW (ref 4.22–5.81)
RDW: 13.8 % (ref 11.5–15.5)
WBC: 14.8 10*3/uL — AB (ref 4.0–10.5)

## 2018-01-25 LAB — TROPONIN I

## 2018-01-25 LAB — HEPARIN LEVEL (UNFRACTIONATED): Heparin Unfractionated: 0.53 IU/mL (ref 0.30–0.70)

## 2018-01-25 LAB — PREPARE RBC (CROSSMATCH)

## 2018-01-25 SURGERY — EVACUATION HEMATOMA
Anesthesia: General | Laterality: Left

## 2018-01-25 MED ORDER — CEFAZOLIN SODIUM-DEXTROSE 2-4 GM/100ML-% IV SOLN
INTRAVENOUS | Status: AC
  Filled 2018-01-25: qty 100

## 2018-01-25 MED ORDER — ACETAMINOPHEN 10 MG/ML IV SOLN: 1000.0000 mg | Freq: Once | INTRAVENOUS | Status: DC | PRN

## 2018-01-25 MED ORDER — SODIUM CHLORIDE 0.9% FLUSH: 9.0000 mL | INTRAVENOUS | Status: DC | PRN

## 2018-01-25 MED ORDER — NALOXONE HCL 0.4 MG/ML IJ SOLN: 0.4000 mg | INTRAMUSCULAR | Status: DC | PRN

## 2018-01-25 MED ORDER — SODIUM CHLORIDE 0.9 % IV SOLN
INTRAVENOUS | Status: DC | PRN
  Administered 2018-01-25: 50 ug/min via INTRAVENOUS

## 2018-01-25 MED ORDER — DIPHENHYDRAMINE HCL 50 MG/ML IJ SOLN: 12.5000 mg | Freq: Four times a day (QID) | INTRAMUSCULAR | Status: DC | PRN

## 2018-01-25 MED ORDER — PHENYLEPHRINE 40 MCG/ML (10ML) SYRINGE FOR IV PUSH (FOR BLOOD PRESSURE SUPPORT)
PREFILLED_SYRINGE | INTRAVENOUS | Status: DC | PRN
  Administered 2018-01-25: 80 ug via INTRAVENOUS
  Administered 2018-01-25 (×2): 120 ug via INTRAVENOUS
  Administered 2018-01-25: 80 ug via INTRAVENOUS

## 2018-01-25 MED ORDER — ALBUMIN HUMAN 5 % IV SOLN
INTRAVENOUS | Status: DC | PRN
  Administered 2018-01-25: 14:00:00 via INTRAVENOUS

## 2018-01-25 MED ORDER — SODIUM CHLORIDE 0.9 % IR SOLN
Status: DC | PRN
  Administered 2018-01-25: 1000 mL

## 2018-01-25 MED ORDER — SUCCINYLCHOLINE CHLORIDE 20 MG/ML IJ SOLN
INTRAMUSCULAR | Status: DC | PRN
  Administered 2018-01-25: 100 mg via INTRAVENOUS

## 2018-01-25 MED ORDER — CEFAZOLIN SODIUM-DEXTROSE 2-3 GM-%(50ML) IV SOLR
INTRAVENOUS | Status: DC | PRN
  Administered 2018-01-25: 2 g via INTRAVENOUS

## 2018-01-25 MED ORDER — DIPHENHYDRAMINE HCL 12.5 MG/5ML PO ELIX
12.5000 mg | ORAL_SOLUTION | Freq: Four times a day (QID) | ORAL | Status: DC | PRN
  Filled 2018-01-25: qty 5

## 2018-01-25 MED ORDER — MORPHINE SULFATE 2 MG/ML IV SOLN
INTRAVENOUS | Status: DC
  Administered 2018-01-25: 20:00:00 via INTRAVENOUS
  Administered 2018-01-25: 3 mg via INTRAVENOUS
  Administered 2018-01-26: 13.5 mL via INTRAVENOUS
  Administered 2018-01-26: 21:00:00 via INTRAVENOUS
  Administered 2018-01-26: 1.5 mL via INTRAVENOUS
  Administered 2018-01-26: 10.5 mg via INTRAVENOUS
  Administered 2018-01-27: 12 mg via INTRAVENOUS
  Administered 2018-01-27: 10.5 mg via INTRAVENOUS
  Filled 2018-01-25 (×5): qty 30

## 2018-01-25 MED ORDER — PROPOFOL 10 MG/ML IV BOLUS
INTRAVENOUS | Status: AC
  Filled 2018-01-25: qty 20

## 2018-01-25 MED ORDER — SODIUM CHLORIDE 0.9 % IV SOLN: 10.0000 mL/h | Freq: Once | INTRAVENOUS | Status: DC

## 2018-01-25 MED ORDER — MIDAZOLAM HCL 2 MG/2ML IJ SOLN
INTRAMUSCULAR | Status: AC
  Filled 2018-01-25: qty 2

## 2018-01-25 MED ORDER — PROPOFOL 10 MG/ML IV BOLUS
INTRAVENOUS | Status: DC | PRN
  Administered 2018-01-25: 160 mg via INTRAVENOUS

## 2018-01-25 MED ORDER — HYDROCODONE-ACETAMINOPHEN 7.5-325 MG PO TABS: 1.0000 | ORAL_TABLET | Freq: Once | ORAL | Status: DC | PRN

## 2018-01-25 MED ORDER — LACTATED RINGERS IV SOLN
INTRAVENOUS | Status: DC | PRN
  Administered 2018-01-25 (×2): via INTRAVENOUS

## 2018-01-25 MED ORDER — MIDAZOLAM HCL 5 MG/5ML IJ SOLN
INTRAMUSCULAR | Status: DC | PRN
  Administered 2018-01-25 (×2): 1 mg via INTRAVENOUS

## 2018-01-25 MED ORDER — ESMOLOL HCL 100 MG/10ML IV SOLN
INTRAVENOUS | Status: DC | PRN
  Administered 2018-01-25 (×2): 10 mg via INTRAVENOUS

## 2018-01-25 MED ORDER — PROMETHAZINE HCL 25 MG/ML IJ SOLN: 6.2500 mg | INTRAMUSCULAR | Status: DC | PRN

## 2018-01-25 MED ORDER — HYDROMORPHONE HCL 1 MG/ML IJ SOLN: 0.2500 mg | INTRAMUSCULAR | Status: DC | PRN

## 2018-01-25 MED ORDER — FENTANYL CITRATE (PF) 250 MCG/5ML IJ SOLN
INTRAMUSCULAR | Status: AC
  Filled 2018-01-25: qty 5

## 2018-01-25 MED ORDER — FENTANYL CITRATE (PF) 100 MCG/2ML IJ SOLN
INTRAMUSCULAR | Status: DC | PRN
  Administered 2018-01-25 (×3): 50 ug via INTRAVENOUS

## 2018-01-25 MED ORDER — MEPERIDINE HCL 50 MG/ML IJ SOLN: 6.2500 mg | INTRAMUSCULAR | Status: DC | PRN

## 2018-01-25 MED ORDER — DEXAMETHASONE SODIUM PHOSPHATE 10 MG/ML IJ SOLN
INTRAMUSCULAR | Status: DC | PRN
  Administered 2018-01-25: 10 mg via INTRAVENOUS

## 2018-01-25 MED ORDER — MORPHINE SULFATE (PF) 4 MG/ML IV SOLN
3.0000 mg | Freq: Once | INTRAVENOUS | Status: AC
  Administered 2018-01-25: 3 mg via INTRAVENOUS

## 2018-01-25 MED ORDER — LIDOCAINE 2% (20 MG/ML) 5 ML SYRINGE
INTRAMUSCULAR | Status: DC | PRN
  Administered 2018-01-25: 100 mg via INTRAVENOUS

## 2018-01-25 SURGICAL SUPPLY — 45 items
BAG ISL DRAPE 18X18 STRL (DRAPES) ×1
BAG ISOLATION DRAPE 18X18 (DRAPES) IMPLANT
BANDAGE ESMARK 6X9 LF (GAUZE/BANDAGES/DRESSINGS) IMPLANT
BNDG CMPR 9X6 STRL LF SNTH (GAUZE/BANDAGES/DRESSINGS)
BNDG ESMARK 6X9 LF (GAUZE/BANDAGES/DRESSINGS)
CANISTER SUCT 3000ML PPV (MISCELLANEOUS) ×3 IMPLANT
CUFF TOURNIQUET SINGLE 18IN (TOURNIQUET CUFF) IMPLANT
CUFF TOURNIQUET SINGLE 24IN (TOURNIQUET CUFF) IMPLANT
CUFF TOURNIQUET SINGLE 34IN LL (TOURNIQUET CUFF) IMPLANT
CUFF TOURNIQUET SINGLE 44IN (TOURNIQUET CUFF) IMPLANT
DRAIN CHANNEL 15F RND FF W/TCR (WOUND CARE) ×2 IMPLANT
DRAPE ISOLATION BAG 18X18 (DRAPES) ×2
DRSG COVADERM 4X8 (GAUZE/BANDAGES/DRESSINGS) ×2 IMPLANT
ELECT REM PT RETURN 9FT ADLT (ELECTROSURGICAL) ×3
ELECTRODE REM PT RTRN 9FT ADLT (ELECTROSURGICAL) ×1 IMPLANT
EVACUATOR SILICONE 100CC (DRAIN) ×2 IMPLANT
GAUZE SPONGE 4X4 12PLY STRL (GAUZE/BANDAGES/DRESSINGS) ×2 IMPLANT
GLOVE BIO SURGEON STRL SZ7.5 (GLOVE) ×3 IMPLANT
GLOVE SURG SS PI 8.0 STRL IVOR (GLOVE) ×3 IMPLANT
GOWN STRL REUS W/ TWL LRG LVL3 (GOWN DISPOSABLE) ×2 IMPLANT
GOWN STRL REUS W/ TWL XL LVL3 (GOWN DISPOSABLE) ×1 IMPLANT
GOWN STRL REUS W/TWL LRG LVL3 (GOWN DISPOSABLE) ×6
GOWN STRL REUS W/TWL XL LVL3 (GOWN DISPOSABLE) ×3
KIT BASIN OR (CUSTOM PROCEDURE TRAY) ×3 IMPLANT
KIT TURNOVER KIT B (KITS) ×3 IMPLANT
NS IRRIG 1000ML POUR BTL (IV SOLUTION) ×6 IMPLANT
PACK CV ACCESS (CUSTOM PROCEDURE TRAY) IMPLANT
PACK GENERAL/GYN (CUSTOM PROCEDURE TRAY) IMPLANT
PACK PERIPHERAL VASCULAR (CUSTOM PROCEDURE TRAY) ×2 IMPLANT
PACK UNIVERSAL I (CUSTOM PROCEDURE TRAY) IMPLANT
PAD ARMBOARD 7.5X6 YLW CONV (MISCELLANEOUS) ×6 IMPLANT
STAPLER VISISTAT 35W (STAPLE) ×2 IMPLANT
SUT ETHILON 3 0 PS 1 (SUTURE) ×2 IMPLANT
SUT MNCRL AB 4-0 PS2 18 (SUTURE) ×2 IMPLANT
SUT PROLENE 5 0 C 1 24 (SUTURE) IMPLANT
SUT PROLENE 6 0 BV (SUTURE) ×2 IMPLANT
SUT VIC AB 2-0 CT1 27 (SUTURE)
SUT VIC AB 2-0 CT1 TAPERPNT 27 (SUTURE) IMPLANT
SUT VIC AB 3-0 SH 27 (SUTURE) ×3
SUT VIC AB 3-0 SH 27X BRD (SUTURE) IMPLANT
TAPE CLOTH SURG 4X10 WHT LF (GAUZE/BANDAGES/DRESSINGS) IMPLANT
TOWEL GREEN STERILE (TOWEL DISPOSABLE) ×3 IMPLANT
TRAY FOLEY MTR SLVR 16FR STAT (SET/KITS/TRAYS/PACK) IMPLANT
UNDERPAD 30X30 (UNDERPADS AND DIAPERS) ×3 IMPLANT
WATER STERILE IRR 1000ML POUR (IV SOLUTION) ×3 IMPLANT

## 2018-01-25 NOTE — Progress Notes (Signed)
Pt with 5 cm hematoma left popliteal space.  Dr Chestine Sporelark is going to evacuate this today.  Hopefully we can leave heparin running otherwise he may need IVC filter.  Keep NPO  Fabienne Brunsharles Janel Beane, MD [facility] [place] Office: [phone] Pager: [phone]

## 2018-01-25 NOTE — Progress Notes (Addendum)
*  PRELIMINARY RESULTS* Vascular Ultrasound Lower Extremity Arterial Duplex has been completed.     RIGHT    LEFT    Waveform Comment  Waveform Comment  CFA Biphasic  CFA Biphasic   PFA Biphasic  PFA Biphasic   SFA Biphasic  SFA Biphasic    Pop A Monophasic  SFA- distal Biphasic 50-74% stenosis  TPT Biphasic 30-49% stenosis Pop A Biphasic   PTA Severely dampened monophasic  PTA Monophasic   Pero A Unable to visualize  Pero A Monophasic   ATA Dampened monophasic  ATA Monophasic   DPA Occluded  DPA Occluded      BPG-prox Biphasic      BPG- dist Monophasic     Study was technically limited and difficult due to patient pain and swelling secondary to BLE DVT. Refer to table above. Incidental finding: there is an anechoic area of the left medial proximal calf measuring 5 x 2 x 4cm, suggestive of possible hematoma.   Of note: patient was in significant pain throughout exam.  Preliminary results discussed with Dr. Darrick PennaFields.  01/25/2018 11:11 AM Michael [name], MHA, RVT, RDCS, RDMS

## 2018-01-25 NOTE — Progress Notes (Signed)
Dr. Chestine Sporelark made aware of lab unable to draw troponin level due to blood transfusion. No new orders given.

## 2018-01-25 NOTE — Progress Notes (Signed)
PT Cancellation Note  Patient Details Name: Michael EhlersGary S Sciascia MRN: [id number] DOB: 08/29/1960   Cancelled Treatment:    Reason Eval/Treat Not Completed: Pain limiting ability to participate;Patient at procedure or test/unavailable. Pt to receive stat BLE arterial duplexes and potential hematoma evacuation. Will follow-up for PT treatment as appropriate.  Ina HomesJaclyn Myer Bohlman, PT, DPT Acute Rehab Services  Pager: [phone]  Malachy ChamberJaclyn L Marlene Pfluger 01/25/2018, 9:13 AM

## 2018-01-25 NOTE — Progress Notes (Addendum)
Progress Note    01/25/2018 8:34 AM 2 Days Post-Op  Subjective:  More pain this morning in both legs with the left worse than right.   Afebrile HR 70's-100's  90's-120's systolic 98% RA  Vitals:   01/25/18 0006 01/25/18 0528  BP: 109/80 128/75  Pulse:    Resp: 17 14  Temp: 98.2 F (36.8 C) 98 F (36.7 C)  SpO2: 98% 96%    Physical Exam: General:  Mild distress distress Cardiac:  regular Lungs:  Non labored Incisions:  Clean with scant bloody drainage on bandages Extremities:  Absent doppler signals right foot; color changes right distal foot and toes; left PT doppler signal present.  BLE swelling.     CBC    Component Value Date/Time   WBC 14.8 (H) 01/25/2018 0322   RBC 3.16 (L) 01/25/2018 0322   HGB 8.8 (L) 01/25/2018 0322   HCT 28.2 (L) 01/25/2018 0322   PLT 161 01/25/2018 0322   MCV 89.2 01/25/2018 0322   MCH 27.8 01/25/2018 0322   MCHC 31.2 01/25/2018 0322   RDW 13.8 01/25/2018 0322   LYMPHSABS 1.2 01/23/2018 0944   MONOABS 0.9 01/23/2018 0944   EOSABS 0.2 01/23/2018 0944   BASOSABS 0.0 01/23/2018 0944    BMET    Component Value Date/Time   NA 133 (L) 01/25/2018 0322   K 3.6 01/25/2018 0322   CL 104 01/25/2018 0322   CO2 22 01/25/2018 0322   GLUCOSE 137 (H) 01/25/2018 0322   BUN 20 01/25/2018 0322   CREATININE 1.55 (H) 01/25/2018 0322   CREATININE 1.16 08/11/2016 1030   CALCIUM 8.1 (L) 01/25/2018 0322   GFRNONAA 48 (L) 01/25/2018 0322   GFRNONAA 70 08/11/2016 1030   GFRAA 56 (L) 01/25/2018 0322   GFRAA 81 08/11/2016 1030    INR    Component Value Date/Time   INR 1.19 01/24/2018 0421     Intake/Output Summary (Last 24 hours) at 01/25/2018 0834 Last data filed at 01/25/2018 0313 Gross per 24 hour  Intake 2678.15 ml  Output 1850 ml  Net 828.15 ml     Assessment:  57 y.o. male is s/p:  Procedure: #1 right popliteal and tibial embolectomy #2 exploration right anterior tibial artery #3 left femoral to below-knee popliteal bypass  (PTFE) #4 left popliteal and tibial endarterectomy  2 Days Post-Op  Plan: -pt with absent doppler signals right foot; PT doppler signal present left foot.  His pain is in both legs but greater on the left.  Will order stat arterial duplexes of both legs and keep npo.  He did eat breakfast this morning.   -he does have swelling in both legs, doubt DVT as he has been on heparin gtt.  May have bled into the calf.  May need evacuation of hematoma left.  -acute blood loss anemia-hgb 8.8 this am down from 10.  Pt is stable -creatinine up at 1.5 from 1.27.  Will continue to monitor.  Continue IVF -leukocytosis of 14.8k-most likely related to periop but will monitor.  Check u/a as he hasn't had one this admission.  CXR a couple of days ago looks ok. -will start PCA for pain control. -DVT prophylaxis:  Heparin gtt   Doreatha MassedSamantha Rhyne, PA-C [facility] [phone] 01/25/2018 8:34 AM   Agree with above.  More pain left leg today? Hematoma.  Flow in left leg is about the same.  No doppler signal right foot today but no real pain.  PCA for pain Duplex both legs to  see where we are with perfusion and rule out hematoma Keep NPO until after duplex and review.  Fabienne Brunsharles Larene Ascencio, MD [facility] [place] Office: [phone] Pager: [phone]

## 2018-01-25 NOTE — Transfer of Care (Signed)
Immediate Anesthesia Transfer of Care Note  Patient: Michael EhlersGary S Buell  Procedure(s) Performed: EVACUATION HEMATOMA LEFT POPLITEAL SPACE (Left )  Patient Location: PACU  Anesthesia Type:General  Level of Consciousness: awake, oriented, patient cooperative and responds to stimulation  Airway & Oxygen Therapy: Patient Spontanous Breathing and Patient connected to face mask oxygen  Post-op Assessment: Report given to RN, Post -op Vital signs reviewed and stable and Patient moving all extremities X 4  Post vital signs: Reviewed and stable  Last Vitals:  Vitals Value Taken Time  BP 132/56 01/25/2018  3:00 PM  Temp    Pulse 102 01/25/2018  3:05 PM  Resp 24 01/25/2018  3:03 PM  SpO2 100 % 01/25/2018  3:05 PM  Vitals shown include unvalidated device data.  Last Pain:  Vitals:   01/25/18 1500  TempSrc:   PainSc: (P) 0-No pain      Patients Stated Pain Goal: 4 (01/23/18 1744)  Complications: No apparent anesthesia complications

## 2018-01-25 NOTE — Progress Notes (Signed)
ANTICOAGULATION CONSULT NOTE - Follow Up Consult  Pharmacy Consult for Heparin Indication: acute bilateral PE, RLE DVT, Ischemic limb  Allergies  Allergen Reactions  . Ivp Dye [Iodinated Diagnostic Agents] Swelling    Patient Measurements: Height: 5\' 8"  (172.7 cm) Weight: 214 lb 15.2 oz (97.5 kg) IBW/kg (Calculated) : 68.4 Heparin Dosing Weight: 89.1 kg  Vital Signs: Temp: 98 F (36.7 C) (08/14 0528) Temp Source: Oral (08/14 0528) BP: 128/75 (08/14 0528)  Labs: Recent Labs    01/23/18 0944  01/24/18 0421 01/24/18 0921 01/24/18 1639 01/25/18 0322  HGB 14.8  --  11.1* 10.1*  --  8.8*  HCT 46.9  --  35.3* 33.3*  --  28.2*  PLT 147*  --  144* 147*  --  161  LABPROT 13.1  --  15.0  --   --   --   INR 1.00  --  1.19  --   --   --   HEPARINUNFRC  --    < >  --  1.09* 0.72* 0.53  CREATININE 1.43*  --  1.27*  --   --  1.55*  TROPONINI  --   --   --  <0.03  --   --    < > = values in this interval not displayed.    Estimated Creatinine Clearance: 59.5 mL/min (A) (by C-G formula based on SCr of 1.55 mg/dL (H)).  Assessment: Michael [name] continuing on heparin for acute bilateral PE, RLE DVT, ischemic limb, s/p OR 8/12 for embolectomy/bypass. Heparin level therapeutic. Hg down to 8.8, plt improved to wnl. No active bleed issues documented.   Goal of Therapy:  Heparin level 0.3-0.7 units/ml Monitor platelets by anticoagulation protocol: Yes   Plan:  Continue heparin at 1000 units/hr Monitor daily heparin level and CBC, s/sx bleeding F/u plan for long-term anticoagulation when all procedures complete  Babs BertinHaley Jasalyn Frysinger, PharmD, BCPS Clinical Pharmacist Clinical phone [phone] Please check AMION for all [facility] contact numbers 01/25/2018 8:23 AM

## 2018-01-25 NOTE — [place] FL2 (Signed)
[place] MEDICAID FL2 LEVEL OF CARE SCREENING TOOL     IDENTIFICATION  Patient Name: Michael [name] Birthdate: 11/07/1960 Sex: male Admission Date (Current Location): 01/23/2018  [facility] [place]   Facility and Address:  [facility], [address], [place], [place] [address]      Provider Number: [id number]  Attending Physician Name and Address:  Sherren KernsFields, Charles E, MD  Relative Name and Phone Number:  Staci AcostaSandra Joiner, [phone]    Current Level of Care: Hospital Recommended Level of Care: Skilled Nursing Facility Prior Approval Number:    Date Approved/Denied:   PASRR Number: [id number]  Discharge Plan: SNF    Current Diagnoses: Patient Active Problem List   Diagnosis Date Noted  . Ischemia of extremity 01/23/2018  . Frequency of urination and polyuria 07/06/2017  . Erectile dysfunction due to diseases classified elsewhere 10/27/2016  . PAD (peripheral artery disease) (HCC) 08/11/2016  . Nocturia 08/11/2016  . Chronic pain of both knees 04/01/2016  . Palpitations 05/05/2015  . Hyperlipidemia 05/05/2015  . HNP (herniated nucleus pulposus), cervical 10/14/2014  . Chronic low back pain 10/14/2014  . Numbness and tingling of both upper extremities while sleeping 10/14/2014  . Frequent urination 09/03/2014  . Lower abdominal pain 09/03/2014  . Cervical stenosis of spine 05/16/2014  . Essential hypertension 05/16/2014  . Numbness and tingling of right arm 11/28/2013  . Dental abscess 04/26/2013  . Dysuria 04/26/2013  . HTN (hypertension) 04/04/2013  . CAD (coronary artery disease) 05/25/2011    Orientation RESPIRATION BLADDER Height & Weight     Self, Place, Situation, Time  O2(nasal cannula 2L) Continent Weight: 214 lb 15.2 oz (97.5 kg) Height:  5\' 8"  (172.7 cm)  BEHAVIORAL SYMPTOMS/MOOD NEUROLOGICAL BOWEL NUTRITION STATUS      Continent Diet(please see DC summary)  AMBULATORY STATUS COMMUNICATION OF  NEEDS Skin   Extensive Assist Verbally Surgical wounds(closed incisions - R leg, R ankle, L leg, L groin)                       Personal Care Assistance Level of Assistance  Bathing, Feeding, Dressing Bathing Assistance: Limited assistance Feeding assistance: Independent Dressing Assistance: Limited assistance     Functional Limitations Info  Sight, Hearing, Speech Sight Info: Adequate Hearing Info: Adequate Speech Info: Adequate    SPECIAL CARE FACTORS FREQUENCY  PT (By licensed PT), OT (By licensed OT)     PT Frequency: 5x/week OT Frequency: 5x/week            Contractures Contractures Info: Not present    Additional Factors Info  Code Status, Allergies Code Status Info: Full Allergies Info: Ivp Dye Iodinated Diagnostic Agents           Current Medications (01/25/2018):  This is the current hospital active medication list Current Facility-Administered Medications  Medication Dose Route Frequency Provider Last Rate Last Dose  . [MAR Hold] 0.9 %  sodium chloride infusion  500 mL Intravenous Once PRN Emilie RutterEveland, Matthew, PA-C      . 0.9 %  sodium chloride infusion   Intravenous Continuous Emilie Rutterveland, Matthew, PA-C 75 mL/hr at 01/24/18 1322    . [MAR Hold] acetaminophen (TYLENOL) tablet 325-650 mg  325-650 mg Oral Q4H PRN Emilie RutterEveland, Matthew, PA-C       Or  . Mitzi Hansen[MAR Hold] acetaminophen (TYLENOL) suppository 325-650 mg  325-650 mg Rectal Q4H PRN Emilie RutterEveland, Matthew, PA-C      . [MAR Hold] bisacodyl (DULCOLAX) suppository 10  mg  10 mg Rectal Daily PRN Emilie Rutter, PA-C      . ceFAZolin (ANCEF) 2-4 GM/100ML-% IVPB           . [MAR Hold] diphenhydrAMINE (BENADRYL) injection 12.5 mg  12.5 mg Intravenous Q6H PRN Rhyne, Samantha J, PA-C       Or  . [MAR Hold] diphenhydrAMINE (BENADRYL) 12.5 MG/5ML elixir 12.5 mg  12.5 mg Oral Q6H PRN Rhyne, Samantha J, PA-C      . [MAR Hold] docusate sodium (COLACE) capsule 100 mg  100 mg Oral Daily Emilie Rutter, PA-C   100 mg at 01/25/18  0841  . [MAR Hold] guaiFENesin-dextromethorphan (ROBITUSSIN DM) 100-10 MG/5ML syrup 15 mL  15 mL Oral Q4H PRN Emilie Rutter, PA-C      . heparin ADULT infusion 100 units/mL (25000 units/227mL sodium chloride 0.45%)  1,000 Units/hr Intravenous Continuous Sherren Kerns, MD 10 mL/hr at 01/24/18 1743 1,000 Units/hr at 01/24/18 1743  . [MAR Hold] hydrALAZINE (APRESOLINE) injection 5 mg  5 mg Intravenous Q20 Min PRN Emilie Rutter, PA-C      . [MAR Hold] labetalol (NORMODYNE,TRANDATE) injection 10 mg  10 mg Intravenous Q10 min PRN Emilie Rutter, PA-C      . [MAR Hold] magnesium sulfate IVPB 2 g 50 mL  2 g Intravenous Daily PRN Emilie Rutter, PA-C      . [MAR Hold] metoprolol tartrate (LOPRESSOR) injection 2-5 mg  2-5 mg Intravenous Q2H PRN Emilie Rutter, PA-C      . [MAR Hold] morphine 2 MG/ML injection 2-5 mg  2-5 mg Intravenous Q1H PRN Emilie Rutter, PA-C   4 mg at 01/25/18 0840  . [MAR Hold] morphine 2 mg/mL PCA injection   Intravenous Q4H Rhyne, Samantha J, PA-C   Stopped at 01/25/18 1321  . [MAR Hold] naloxone (NARCAN) injection 0.4 mg  0.4 mg Intravenous PRN Rhyne, Samantha J, PA-C       And  . [MAR Hold] sodium chloride flush (NS) 0.9 % injection 9 mL  9 mL Intravenous PRN Rhyne, [place], PA-C      . [MAR Hold] ondansetron (ZOFRAN) injection 4 mg  4 mg Intravenous Q6H PRN Emilie Rutter, PA-C      . [MAR Hold] oxyCODONE-acetaminophen (PERCOCET/ROXICET) 5-325 MG per tablet 1-2 tablet  1-2 tablet Oral Q4H PRN Emilie Rutter, PA-C   2 tablet at 01/25/18 4098  . [MAR Hold] pantoprazole (PROTONIX) EC tablet 40 mg  40 mg Oral Daily Emilie Rutter, PA-C   40 mg at 01/25/18 0841  . [MAR Hold] phenol (CHLORASEPTIC) mouth spray 1 spray  1 spray Mouth/Throat PRN Emilie Rutter, PA-C      . [MAR Hold] polyethylene glycol (MIRALAX / GLYCOLAX) packet 17 g  17 g Oral Daily PRN Emilie Rutter, PA-C      . [MAR Hold] potassium chloride SA (K-DUR,KLOR-CON) CR tablet 20-40 mEq  20-40  mEq Oral Once Emilie Rutter, PA-C      . [MAR Hold] potassium chloride SA (K-DUR,KLOR-CON) CR tablet 20-40 mEq  20-40 mEq Oral Daily PRN Emilie Rutter, PA-C      . sodium chloride irrigation 0.9 %    PRN Cephus Shelling, MD   1,000 mL at 01/25/18 1313   Facility-Administered Medications Ordered in Other Encounters  Medication Dose Route Frequency Provider Last Rate Last Dose  . albumin human 5 % solution    Continuous PRN Everlene Balls T, CRNA   Stopped at 01/25/18 1414  . ceFAZolin (ANCEF) IVPB 2 g/50 mL premix  Intravenous Anesthesia Intra-op Everlene BallsHayes, Christine T, CRNA   2 g at 01/25/18 1355  . dexamethasone (DECADRON) injection    Anesthesia Intra-op Everlene BallsHayes, Christine T, CRNA   10 mg at 01/25/18 1415  . fentaNYL (SUBLIMAZE) injection    Anesthesia Intra-op Everlene BallsHayes, Christine T, CRNA   50 mcg at 01/25/18 1433  . lactated ringers infusion    Continuous PRN Everlene BallsHayes, Christine T, CRNA      . lidocaine 2% (20 mg/mL) 5 mL syringe   Intravenous Anesthesia Intra-op Everlene BallsHayes, Christine T, CRNA   100 mg at 01/25/18 1350  . midazolam (VERSED) 5 MG/5ML injection    Anesthesia Intra-op Everlene BallsHayes, Christine T, CRNA   1 mg at 01/25/18 1345  . phenylephrine (NEO-SYNEPHRINE) 0.04 mg/mL in sodium chloride 0.9 % 250 mL infusion    Continuous PRN Everlene BallsHayes, Christine T, CRNA 90 mL/hr at 01/25/18 1434 60 mcg/min at 01/25/18 1434  . PHENYLephrine 40 mcg/ml in normal saline Adult IV Push Syringe   Intravenous Anesthesia Intra-op Rosalio MacadamiaHayes, Christine T, CRNA   120 mcg at 01/25/18 1402  . propofol (DIPRIVAN) 10 mg/mL bolus/IV push    Anesthesia Intra-op Everlene BallsHayes, Christine T, CRNA   160 mg at 01/25/18 1350  . succinylcholine (ANECTINE) injection    Anesthesia Intra-op Everlene BallsHayes, Christine T, CRNA   100 mg at 01/25/18 1350     Discharge Medications: Please see discharge summary for a list of discharge medications.  Relevant Imaging Results:  Relevant Lab Results:   Additional Information SSN: [id number]  Abigail ButtsSusan Darlisa Spruiell,  LCSW

## 2018-01-25 NOTE — Progress Notes (Addendum)
PCA pump setup and started at 1130 with second nurse, Mitzi DavenportFola. Called pharmacy and notified of start up time showing 1130 and not 0845. Patient was not available at 0845 for the first dose he was in vascular lab. Patient got his first dose at 1130. Will continue to monitor patient.

## 2018-01-25 NOTE — Op Note (Addendum)
Date: January 25, 2018  Preoperative Diagnosis: Left popliteal space hematoma  Postoperative Diagnosis: Left popliteal space hematoma  Procedure: Evacuation of left leg hematoma   Surgeon: Dr. Sherald Hesshristopher Corinn Stoltzfus, MD  Assistant: Aggie MoatsMatt Eveland, PA  Indications: Patient is a 57 year old male who presented to the ED earlier this week with concerns for acute on chronic limb ischemia of his bilateral lower extremities.  He subsequently underwent bilateral lower extremity thromboembolectomies as well as a left femoral to below-knee popliteal bypass with prosthetic.  Today while on heparin he developed increasing left leg pain.  A bedside duplex revealed a left popliteal space hematoma.  He presents for evacuation of the hematoma after risks and benefits were discussed.  Findings: Approximately 300 mL of dark hematoma evacuated from the left popliteal space.  Complications: None  Anesthesia: LMA  Details: The patient was taken to the operating room after informed consent was obtained.  He was placed on the operating table in supine position.  His left leg was then prepped and draped in usual sterile fashion.  We removed staples from his previous below-knee popliteal incision and dissected through his old incision.  Upon entering the popliteal space we evacuated approximately 300 mL of old blood.  Of note the patient was still on his heparin drip at the time of our evacuation since we felt that the benefits outweighed the risks given his history of DVT and PE.  There were no overt signs of bleeding other than a tiny ooze coming from suture line on the toe of his distal anastomosis.  I did reinforce the suture line here with a single interrupted 6-0 Prolene.  The wound was then copiously irrigated with saline until the effluent was clear.  We brought a [address] [place] drain onto the field and tunneled it through the subcutaneous tissue medially and secured it to the skin with a 3-0 Nylon.  The drain itself  was placed in the popliteal space.  At this point in time the subcutaneous tissue was run closed with a 3-0 Vicryl.  Staples were applied.  He was awakened taken anesthesia and taken to the PACU in stable condition.  Cephus Shellinghristopher J. Ramanda Paules, MD [facility] [place] Office: [phone] Pager: [phone]

## 2018-01-25 NOTE — Anesthesia Preprocedure Evaluation (Addendum)
Anesthesia Evaluation  Patient identified by MRN, date of birth, ID band Patient awake    Reviewed: Allergy & Precautions, H&P , NPO status , Patient's Chart, lab work & pertinent test results, reviewed documented beta blocker date and time   Airway Mallampati: III  TM Distance: >3 FB Neck ROM: Full    Dental no notable dental hx. (+) Partial Upper, Dental Advisory Given,    Pulmonary neg pulmonary ROS,    Pulmonary exam normal breath sounds clear to auscultation       Cardiovascular hypertension, On Medications and On Home Beta Blockers + CAD, + Past MI, + Cardiac Stents and + Peripheral Vascular Disease   Rhythm:Regular Rate:Normal     Neuro/Psych negative neurological ROS  negative psych ROS   GI/Hepatic negative GI ROS, Neg liver ROS,   Endo/Other  negative endocrine ROS  Renal/GU Renal InsufficiencyRenal disease  negative genitourinary   Musculoskeletal   Abdominal   Peds  Hematology negative hematology ROS (+)   Anesthesia Other Findings   Reproductive/Obstetrics negative OB ROS                            Anesthesia Physical Anesthesia Plan  ASA: III and emergent  Anesthesia Plan: General   Post-op Pain Management:    Induction: Intravenous, Cricoid pressure planned and Rapid sequence  PONV Risk Score and Plan: Treatment may vary due to age or medical condition, Ondansetron and Dexamethasone  Airway Management Planned: Oral ETT  Additional Equipment:   Intra-op Plan:   Post-operative Plan: Extubation in OR  Informed Consent: I have reviewed the patients History and Physical, chart, labs and discussed the procedure including the risks, benefits and alternatives for the proposed anesthesia with the patient or authorized representative who has indicated his/her understanding and acceptance.   Dental advisory given  Plan Discussed with: CRNA and  Anesthesiologist  Anesthesia Plan Comments:        Anesthesia Quick Evaluation
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
## 2018-01-25 NOTE — Anesthesia Postprocedure Evaluation (Signed)
Anesthesia Post Note  Patient: Michael [name]  Procedure(s) Performed: EVACUATION HEMATOMA LEFT POPLITEAL SPACE (Left )     Patient location during evaluation: PACU Anesthesia Type: General Level of consciousness: awake and alert Pain management: pain level controlled Vital Signs Assessment: post-procedure vital signs reviewed and stable Respiratory status: spontaneous breathing, nonlabored ventilation and respiratory function stable Cardiovascular status: blood pressure returned to baseline and stable Postop Assessment: no apparent nausea or vomiting Anesthetic complications: no    Last Vitals:  Vitals:   01/25/18 1550 01/25/18 1600  BP: 116/62 113/72  Pulse: 96 (!) 104  Resp: 13 15  Temp: 36.6 C   SpO2: 100% 98%    Last Pain:  Vitals:   01/25/18 1600  TempSrc:   PainSc: 0-No pain                 Analeya Luallen,W. EDMOND

## 2018-01-25 NOTE — Progress Notes (Signed)
Patient going to OR. OR staff asked us to take patient off of PCA pump. Stopped PCA pume and  Wasted 18.5 mg in  with a 2nd nurse, Halina AndreasFola Paseda.

## 2018-01-25 NOTE — Progress Notes (Signed)
OT Cancellation Note  Patient Details Name: Michael [name] MRN: [id number] DOB: 01/10/1961   Cancelled Treatment:    Reason Eval/Treat Not Completed: Medical issues which prohibited therapy(Increased pain. RN reporting pt with hematomas and requesting therapy to hold. Will return as schedule allows.)  [name] [name] [name] MSOT, OTR/L Acute Rehab Pager: [phone] Office: [phone] 01/25/2018, 8:45 AM

## 2018-01-25 NOTE — Progress Notes (Signed)
Patient arrived from PACU. Patient has JP drain left popliteal space. Area is clean dry and intact. Patient hgb dropped 10.1 to 8.8, patient came to unit with 1 unit of blood running at 150 ml/hr.  Patient VS are stable and he is alert and oriented. He denies pain at this time. He is resting comfortably in bed. Will continue to monitor patient.

## 2018-01-26 ENCOUNTER — Encounter (HOSPITAL_COMMUNITY): Payer: Self-pay | Admitting: Vascular Surgery

## 2018-01-26 DIAGNOSIS — I998 Other disorder of circulatory system: Secondary | ICD-10-CM

## 2018-01-26 DIAGNOSIS — I739 Peripheral vascular disease, unspecified: Secondary | ICD-10-CM

## 2018-01-26 DIAGNOSIS — R5381 Other malaise: Secondary | ICD-10-CM

## 2018-01-26 DIAGNOSIS — I10 Essential (primary) hypertension: Secondary | ICD-10-CM

## 2018-01-26 LAB — TYPE AND SCREEN
ABO/RH(D): B POS
Antibody Screen: NEGATIVE
Unit division: 0

## 2018-01-26 LAB — CBC
HCT: 25.4 % — ABNORMAL LOW (ref 39.0–52.0)
Hemoglobin: 8 g/dL — ABNORMAL LOW (ref 13.0–17.0)
MCH: 27.7 pg (ref 26.0–34.0)
MCHC: 31.5 g/dL (ref 30.0–36.0)
MCV: 87.9 fL (ref 78.0–100.0)
PLATELETS: 144 10*3/uL — AB (ref 150–400)
RBC: 2.89 MIL/uL — AB (ref 4.22–5.81)
RDW: 13.9 % (ref 11.5–15.5)
WBC: 12.6 10*3/uL — AB (ref 4.0–10.5)

## 2018-01-26 LAB — BPAM RBC
Blood Product Expiration Date: 201908312359
ISSUE DATE / TIME: 201908141516
UNIT TYPE AND RH: 7300

## 2018-01-26 LAB — HEPARIN LEVEL (UNFRACTIONATED)
HEPARIN UNFRACTIONATED: 0.18 [IU]/mL — AB (ref 0.30–0.70)
HEPARIN UNFRACTIONATED: 0.15 [IU]/mL — AB (ref 0.30–0.70)
HEPARIN UNFRACTIONATED: 0.29 [IU]/mL — AB (ref 0.30–0.70)

## 2018-01-26 LAB — TROPONIN I: Troponin I: <0.03 ng/mL (ref <0.03)

## 2018-01-26 NOTE — Progress Notes (Signed)
ANTICOAGULATION CONSULT NOTE - Follow Up Consult  Pharmacy Consult for Heparin Indication: acute bilateral PE, RLE DVT, Ischemic limb  Allergies  Allergen Reactions  . Ivp Dye [Iodinated Diagnostic Agents] Swelling    Patient Measurements: Height: 5\' 8"  (172.7 cm) Weight: 214 lb 15.2 oz (97.5 kg) IBW/kg (Calculated) : 68.4 Heparin Dosing Weight: 89.1 kg  Vital Signs: Temp: 98.2 F (36.8 C) (08/15 0811) Temp Source: Oral (08/15 0811) BP: 112/65 (08/15 0811) Pulse Rate: 63 (08/15 0811)  Labs: Recent Labs    01/24/18 0421 01/24/18 [id number]  01/25/18 0322 01/25/18 2110 01/26/18 0306 01/26/18 1224  HGB 11.1* 10.1*  --  8.8*  --  8.0*  --   HCT 35.3* 33.3*  --  28.2*  --  25.4*  --   PLT 144* 147*  --  161  --  144*  --   LABPROT 15.0  --   --   --   --   --   --   INR 1.19  --   --   --   --   --   --   HEPARINUNFRC  --  1.09*   < > 0.53  --  0.18* 0.15*  CREATININE 1.27*  --   --  1.55*  --   --   --   TROPONINI  --  <0.03  --   --  <0.03 <0.03  --    < > = values in this interval not displayed.    Estimated Creatinine Clearance: 59.5 mL/min (A) (by C-G formula based on SCr of 1.55 mg/dL (H)).  Assessment: 57 yom continuing on heparin for acute bilateral PE, RLE DVT, ischemic limb, s/p OR 8/12 for embolectomy/bypass. Heparin level therapeutic. Hg down to 8.8, plt improved to wnl. No active bleed issues documented.  8/15 am update: heparin level low at 0.18, hgb down some to 8, pt did go back to OR 8/14 for hematoma evacuation but his heparin drip was continued throughout the procedure due to acute PE/DVT.   PM heparin level still low   Goal of Therapy:  Heparin level 0.3-0.7 units/ml Monitor platelets by anticoagulation protocol: Yes   Plan:  Inc heparin to 1300 units/hr 2300 HL Trend Hgb  Thank you Okey RegalLisa Dahlton Hinde, PharmD [phone]
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
## 2018-01-26 NOTE — Progress Notes (Signed)
ANTICOAGULATION CONSULT NOTE - Follow Up Consult  Pharmacy Consult for Heparin Indication: acute bilateral PE, RLE DVT, Ischemic limb  Allergies  Allergen Reactions  . Ivp Dye [Iodinated Diagnostic Agents] Swelling    Patient Measurements: Height: 5\' 8"  (172.7 cm) Weight: 214 lb 15.2 oz (97.5 kg) IBW/kg (Calculated) : 68.4 Heparin Dosing Weight: 89.1 kg  Vital Signs: Temp: 98.3 F (36.8 C) (08/15 1933) Temp Source: Oral (08/15 1933) BP: 118/64 (08/15 1933) Pulse Rate: 105 (08/15 1933)  Labs: Recent Labs    01/24/18 0421 01/24/18 0921  01/25/18 0322 01/25/18 2110 01/26/18 0306 01/26/18 1224 01/26/18 2239  HGB 11.1* 10.1*  --  8.8*  --  8.0*  --   --   HCT 35.3* 33.3*  --  28.2*  --  25.4*  --   --   PLT 144* 147*  --  161  --  144*  --   --   LABPROT 15.0  --   --   --   --   --   --   --   INR 1.19  --   --   --   --   --   --   --   HEPARINUNFRC  --  1.09*   < > 0.53  --  0.18* 0.15* 0.29*  CREATININE 1.27*  --   --  1.55*  --   --   --   --   TROPONINI  --  <0.03  --   --  <0.03 <0.03  --   --    < > = values in this interval not displayed.    Estimated Creatinine Clearance: 59.5 mL/min (A) (by C-G formula based on SCr of 1.55 mg/dL (H)).  Assessment: 57 yom continuing on heparin for acute bilateral PE, RLE DVT, ischemic limb, s/p OR 8/12 for embolectomy/bypass. Heparin level therapeutic. Hg down to 8.8, plt improved to wnl. No active bleed issues documented.  8/15 pm update: heparin level low at 0.29, no current issues per RN.   Goal of Therapy:  Heparin level 0.3-0.7 units/ml Monitor platelets by anticoagulation protocol: Yes   Plan:  Inc heparin to 1450 units/hr Heparin level with AM labs Trend Hgb  Abran DukeJames Beverly Ferner, PharmD, BCPS Clinical Pharmacist Phone: [phone]
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
## 2018-01-26 NOTE — Consult Note (Signed)
            [facility]  01/26/2018  Michael [name] 01/28/1961 [id number]   Went to see patient at the bedside to identify possible discharge needs. Patient reports "not able to walk longer distances due to severe pain to both legs" that resulted to this admission/ surgery. (Left popliteal space hematoma, status post evacuation of left leg hematoma)   Patient endorsesDr. Jeanann Lewandowskylugbemiga Jegede with [facility] as his primary care provider but had left the practice and he was assigned to another provider in the same practice but unable to recall the name.   [facility] [address] [address] to obtain medications without any problem.   Patientreportsmanaging his ownmedications at home using"pill box" system filled once a week.  Patient states that he has been driving prior to admission/ surgery but wife Michael [name]([name]) or his neighbors will be able to provide transportation tohis doctors' appointments after discharge.  Patient verbalized that wife will be his primary caregiver at home when discharge.  Anticipated discharge plan isskilled nursing facility (SNF) for rehabilitation per therapy recommendation.  Patient voiced understanding to call primary care provider's office once he gets back home, for a post discharge follow-up appointment within a1- 2 weeksor sooner if needs arise.Patient letter (with PCP's contact number) wasprovided as a reminder.  Discussed with patientregarding [facility] CM services available for health managementand resourcesat home but he denies any health issues or needs for now and states being able to manage health needs with wife's assistance so far.   Patientverbalizedunderstandingof need to seekreferralfrom primary care provider to [facility] care management ifdeemed necessary and appropriatefor services in thefuture- oncehe returnsback home.  [facility] care [facility]  information was provided for future needsthathe may have.   For additional questions please contact:  Karin GoldenLorraine A. Tokiko Diefenderfer, BSN, RN-BC [facility] PRIMARY [facility] Navigator Cell: [phone]

## 2018-01-26 NOTE — Progress Notes (Signed)
ANTICOAGULATION CONSULT NOTE - Follow Up Consult  Pharmacy Consult for Heparin Indication: acute bilateral PE, RLE DVT, Ischemic limb  Allergies  Allergen Reactions  . Ivp Dye [Iodinated Diagnostic Agents] Swelling    Patient Measurements: Height: 5\' 8"  (172.7 cm) Weight: 214 lb 15.2 oz (97.5 kg) IBW/kg (Calculated) : 68.4 Heparin Dosing Weight: 89.1 kg  Vital Signs: Temp: 98.4 F (36.9 C) (08/14 2347) Temp Source: Oral (08/14 2347) BP: 148/80 (08/14 2347) Pulse Rate: 87 (08/14 2347)  Labs: Recent Labs    01/23/18 0944  01/24/18 0421 01/24/18 0921 01/24/18 1639 01/25/18 0322 01/25/18 2110 01/26/18 0306  HGB 14.8  --  11.1* 10.1*  --  8.8*  --  8.0*  HCT 46.9  --  35.3* 33.3*  --  28.2*  --  25.4*  PLT 147*  --  144* 147*  --  161  --  144*  LABPROT 13.1  --  15.0  --   --   --   --   --   INR 1.00  --  1.19  --   --   --   --   --   HEPARINUNFRC  --    < >  --  1.09* 0.72* 0.53  --  0.18*  CREATININE 1.43*  --  1.27*  --   --  1.55*  --   --   TROPONINI  --   --   --  <0.03  --   --  <0.03  --    < > = values in this interval not displayed.    Estimated Creatinine Clearance: 59.5 mL/min (A) (by C-G formula based on SCr of 1.55 mg/dL (H)).  Assessment: 57 yom continuing on heparin for acute bilateral PE, RLE DVT, ischemic limb, s/p OR 8/12 for embolectomy/bypass. Heparin level therapeutic. Hg down to 8.8, plt improved to wnl. No active bleed issues documented.  8/15 am update: heparin level low at 0.18, hgb down some to 8, pt did go back to OR 8/14 for hematoma evacuation but his heparin drip was continued throughout the procedure due to acute PE/DVT.   Goal of Therapy:  Heparin level 0.3-0.7 units/ml Monitor platelets by anticoagulation protocol: Yes   Plan:  Inc heparin to 1100 units/hr 1200 HL Trend Hgb  Michael [name], PharmD, BCPS Clinical Pharmacist Phone: [phone]

## 2018-01-26 NOTE — Progress Notes (Addendum)
  Progress Note    01/26/2018 7:17 AM 1 Day Post-Op  Subjective:  Feels much more comfortable today.  Wants to get up and move with therapy.  Afebrile HR 70's-120's NSR/ST 110's-140's systolic 100% RA  Vitals:   01/26/18 0400 01/26/18 0409  BP:  114/65  Pulse:  93  Resp: 18 15  Temp:  98.2 F (36.8 C)  SpO2: 100% 100%    Physical Exam: Cardiac:  regular Lungs:  Non labored Incisions:  Left groin is clean and dry; some serosanguinous drainage from the left BK incision.  Right BK dressed with drain.  Extremities:  PT/peroneal doppler signals present bilateral feet; motor and sensation are in tact both feet.     CBC    Component Value Date/Time   WBC 12.6 (H) 01/26/2018 0306   RBC 2.89 (L) 01/26/2018 0306   HGB 8.0 (L) 01/26/2018 0306   HCT 25.4 (L) 01/26/2018 0306   PLT 144 (L) 01/26/2018 0306   MCV 87.9 01/26/2018 0306   MCH 27.7 01/26/2018 0306   MCHC 31.5 01/26/2018 0306   RDW 13.9 01/26/2018 0306   LYMPHSABS 1.2 01/23/2018 0944   MONOABS 0.9 01/23/2018 0944   EOSABS 0.2 01/23/2018 0944   BASOSABS 0.0 01/23/2018 0944    BMET    Component Value Date/Time   NA 133 (L) 01/25/2018 0322   K 3.6 01/25/2018 0322   CL 104 01/25/2018 0322   CO2 22 01/25/2018 0322   GLUCOSE 137 (H) 01/25/2018 0322   BUN 20 01/25/2018 0322   CREATININE 1.55 (H) 01/25/2018 0322   CREATININE 1.16 08/11/2016 1030   CALCIUM 8.1 (L) 01/25/2018 0322   GFRNONAA 48 (L) 01/25/2018 0322   GFRNONAA 70 08/11/2016 1030   GFRAA 56 (L) 01/25/2018 0322   GFRAA 81 08/11/2016 1030    INR    Component Value Date/Time   INR 1.19 01/24/2018 0421     Intake/Output Summary (Last 24 hours) at 01/26/2018 0717 Last data filed at 01/26/2018 0600 Gross per 24 hour  Intake 2095.45 ml  Output 3213 ml  Net -1117.55 ml     Assessment:  57 y.o. male is s/p:  right popliteal and tibial embolectomy #2 exploration right anterior tibial artery #3 left femoral to below-knee popliteal bypass (PTFE)  #4 left popliteal and tibial endarterectomy and Evacuation of left popliteal space hematoma  1 Day Post-Op  Plan: -pt with PT/peroneal doppler signals present bilateral feet -feels much better today.  Drain with 30-40cc on night shift-will keep drain in today. -needs to mobilize and wants to work with PT -DVT prophylaxis:  Heparin gtt-continue and convert over to oral AC over the weekend.  -discussed groin wound care and to keep wound dry to help prevent wound infection.  He has gauze at bedside for this.  -acute blood loss anemia - hgb at 8 (down from 8.8, which is somewhat expected after surgery yesterday).  Will check labs tomorrow.  -possible CIR-will await PT recs -will keep drain and PCA today and discontinue both tomorrow.     Doreatha MassedSamantha Rhyne, PA-C [facility] [phone] 01/26/2018 7:17 AM  Agree with above.  Much improved doppler signals both feet.  OOB ambulate today.  Drain out tomorrow. D/c PCA tomorrow Rehab vs Home Monday  Fabienne Brunsharles Gricelda Foland, MD [facility] [place] Office: [phone] Pager: [phone]

## 2018-01-27 ENCOUNTER — Inpatient Hospital Stay (HOSPITAL_COMMUNITY)
Admission: RE | Admit: 2018-01-27 | Discharge: 2018-02-11 | DRG: 945 | Disposition: A | Discharge status: Home Health | Payer: Medicare Other | Source: Intra-hospital | Attending: Physical Medicine & Rehabilitation | Admitting: Physical Medicine & Rehabilitation

## 2018-01-27 ENCOUNTER — Other Ambulatory Visit: Payer: Self-pay

## 2018-01-27 ENCOUNTER — Encounter (HOSPITAL_COMMUNITY): Payer: Self-pay | Admitting: Nurse Practitioner

## 2018-01-27 DIAGNOSIS — F419 Anxiety disorder, unspecified: Secondary | ICD-10-CM | POA: Diagnosis present

## 2018-01-27 DIAGNOSIS — I82890 Acute embolism and thrombosis of other specified veins: Secondary | ICD-10-CM | POA: Diagnosis not present

## 2018-01-27 DIAGNOSIS — G629 Polyneuropathy, unspecified: Secondary | ICD-10-CM | POA: Diagnosis present

## 2018-01-27 DIAGNOSIS — Z79899 Other long term (current) drug therapy: Secondary | ICD-10-CM

## 2018-01-27 DIAGNOSIS — D62 Acute posthemorrhagic anemia: Secondary | ICD-10-CM

## 2018-01-27 DIAGNOSIS — I82431 Acute embolism and thrombosis of right popliteal vein: Secondary | ICD-10-CM | POA: Diagnosis not present

## 2018-01-27 DIAGNOSIS — I2510 Atherosclerotic heart disease of native coronary artery without angina pectoris: Secondary | ICD-10-CM | POA: Diagnosis present

## 2018-01-27 DIAGNOSIS — Z955 Presence of coronary angioplasty implant and graft: Secondary | ICD-10-CM

## 2018-01-27 DIAGNOSIS — F429 Obsessive-compulsive disorder, unspecified: Secondary | ICD-10-CM

## 2018-01-27 DIAGNOSIS — R7303 Prediabetes: Secondary | ICD-10-CM | POA: Diagnosis present

## 2018-01-27 DIAGNOSIS — I82411 Acute embolism and thrombosis of right femoral vein: Secondary | ICD-10-CM | POA: Diagnosis not present

## 2018-01-27 DIAGNOSIS — R252 Cramp and spasm: Secondary | ICD-10-CM | POA: Diagnosis not present

## 2018-01-27 DIAGNOSIS — K5903 Drug induced constipation: Secondary | ICD-10-CM

## 2018-01-27 DIAGNOSIS — I252 Old myocardial infarction: Secondary | ICD-10-CM

## 2018-01-27 DIAGNOSIS — G8918 Other acute postprocedural pain: Secondary | ICD-10-CM | POA: Diagnosis not present

## 2018-01-27 DIAGNOSIS — E785 Hyperlipidemia, unspecified: Secondary | ICD-10-CM | POA: Diagnosis present

## 2018-01-27 DIAGNOSIS — R5381 Other malaise: Principal | ICD-10-CM | POA: Diagnosis present

## 2018-01-27 DIAGNOSIS — I97638 Postprocedural hematoma of a circulatory system organ or structure following other circulatory system procedure: Secondary | ICD-10-CM | POA: Diagnosis not present

## 2018-01-27 DIAGNOSIS — I82441 Acute embolism and thrombosis of right tibial vein: Secondary | ICD-10-CM | POA: Diagnosis not present

## 2018-01-27 DIAGNOSIS — Z7901 Long term (current) use of anticoagulants: Secondary | ICD-10-CM

## 2018-01-27 DIAGNOSIS — Z7982 Long term (current) use of aspirin: Secondary | ICD-10-CM | POA: Diagnosis not present

## 2018-01-27 DIAGNOSIS — I951 Orthostatic hypotension: Secondary | ICD-10-CM | POA: Diagnosis not present

## 2018-01-27 DIAGNOSIS — M62838 Other muscle spasm: Secondary | ICD-10-CM | POA: Diagnosis present

## 2018-01-27 DIAGNOSIS — L03115 Cellulitis of right lower limb: Secondary | ICD-10-CM | POA: Diagnosis not present

## 2018-01-27 DIAGNOSIS — M79609 Pain in unspecified limb: Secondary | ICD-10-CM | POA: Diagnosis not present

## 2018-01-27 DIAGNOSIS — I10 Essential (primary) hypertension: Secondary | ICD-10-CM | POA: Diagnosis present

## 2018-01-27 DIAGNOSIS — T402X5A Adverse effect of other opioids, initial encounter: Secondary | ICD-10-CM | POA: Diagnosis not present

## 2018-01-27 DIAGNOSIS — I70209 Unspecified atherosclerosis of native arteries of extremities, unspecified extremity: Secondary | ICD-10-CM | POA: Diagnosis not present

## 2018-01-27 DIAGNOSIS — K5900 Constipation, unspecified: Secondary | ICD-10-CM | POA: Diagnosis present

## 2018-01-27 DIAGNOSIS — N182 Chronic kidney disease, stage 2 (mild): Secondary | ICD-10-CM | POA: Diagnosis not present

## 2018-01-27 DIAGNOSIS — I739 Peripheral vascular disease, unspecified: Secondary | ICD-10-CM

## 2018-01-27 LAB — CBC
HCT: 24.9 % — ABNORMAL LOW (ref 39.0–52.0)
Hemoglobin: 8 g/dL — ABNORMAL LOW (ref 13.0–17.0)
MCH: 28.7 pg (ref 26.0–34.0)
MCHC: 32.1 g/dL (ref 30.0–36.0)
MCV: 89.2 fL (ref 78.0–100.0)
PLATELETS: 173 10*3/uL (ref 150–400)
RBC: 2.79 MIL/uL — ABNORMAL LOW (ref 4.22–5.81)
RDW: 14.1 % (ref 11.5–15.5)
WBC: 9.7 10*3/uL (ref 4.0–10.5)

## 2018-01-27 LAB — HEPARIN LEVEL (UNFRACTIONATED): Heparin Unfractionated: 0.42 IU/mL (ref 0.30–0.70)

## 2018-01-27 MED ORDER — ACETAMINOPHEN 650 MG RE SUPP: 325.0000 mg | RECTAL | Status: DC | PRN

## 2018-01-27 MED ORDER — PANTOPRAZOLE SODIUM 40 MG PO TBEC
40.0000 mg | DELAYED_RELEASE_TABLET | Freq: Every day | ORAL | Status: DC
  Administered 2018-01-28 – 2018-02-11 (×15): 40 mg via ORAL
  Filled 2018-01-27 (×15): qty 1

## 2018-01-27 MED ORDER — RIVAROXABAN (XARELTO) VTE STARTER PACK (15 & 20 MG): ORAL_TABLET | ORAL | 0 refills | Status: DC

## 2018-01-27 MED ORDER — OXYCODONE-ACETAMINOPHEN 5-325 MG PO TABS
1.0000 | ORAL_TABLET | ORAL | Status: DC | PRN
  Administered 2018-01-27 – 2018-01-28 (×7): 2 via ORAL
  Administered 2018-01-29: 1 via ORAL
  Administered 2018-01-29 – 2018-01-31 (×11): 2 via ORAL
  Administered 2018-01-31: 1 via ORAL
  Administered 2018-01-31 (×3): 2 via ORAL
  Administered 2018-01-31: 1 via ORAL
  Administered 2018-02-01 (×5): 2 via ORAL
  Administered 2018-02-02 – 2018-02-03 (×4): 1 via ORAL
  Administered 2018-02-03 – 2018-02-04 (×6): 2 via ORAL
  Administered 2018-02-04 – 2018-02-05 (×4): 1 via ORAL
  Administered 2018-02-05 – 2018-02-08 (×15): 2 via ORAL
  Administered 2018-02-09: 1 via ORAL
  Administered 2018-02-09 (×3): 2 via ORAL
  Administered 2018-02-10: 1 via ORAL
  Administered 2018-02-10: 2 via ORAL
  Administered 2018-02-10: 1 via ORAL
  Administered 2018-02-10 – 2018-02-11 (×4): 2 via ORAL
  Filled 2018-01-27 (×4): qty 2
  Filled 2018-01-27: qty 1
  Filled 2018-01-27 (×4): qty 2
  Filled 2018-01-27: qty 1
  Filled 2018-01-27 (×13): qty 2
  Filled 2018-01-27 (×2): qty 1
  Filled 2018-01-27 (×4): qty 2
  Filled 2018-01-27: qty 1
  Filled 2018-01-27 (×15): qty 2
  Filled 2018-01-27: qty 1
  Filled 2018-01-27 (×10): qty 2
  Filled 2018-01-27: qty 1
  Filled 2018-01-27 (×4): qty 2
  Filled 2018-01-27: qty 1
  Filled 2018-01-27 (×10): qty 2
  Filled 2018-01-27: qty 1

## 2018-01-27 MED ORDER — RIVAROXABAN 20 MG PO TABS: 20.0000 mg | ORAL_TABLET | Freq: Every day | ORAL | Status: DC

## 2018-01-27 MED ORDER — RIVAROXABAN 15 MG PO TABS
15.0000 mg | ORAL_TABLET | Freq: Two times a day (BID) | ORAL | Status: DC
  Administered 2018-01-27: 15 mg via ORAL
  Filled 2018-01-27: qty 1

## 2018-01-27 MED ORDER — RIVAROXABAN 15 MG PO TABS: 15.0000 mg | ORAL_TABLET | Freq: Two times a day (BID) | ORAL | Status: DC

## 2018-01-27 MED ORDER — DOCUSATE SODIUM 100 MG PO CAPS
100.0000 mg | ORAL_CAPSULE | Freq: Every day | ORAL | Status: DC
  Administered 2018-01-28 – 2018-01-30 (×3): 100 mg via ORAL
  Filled 2018-01-27 (×4): qty 1

## 2018-01-27 MED ORDER — BISACODYL 10 MG RE SUPP
10.0000 mg | Freq: Every day | RECTAL | Status: DC | PRN
  Administered 2018-01-29: 10 mg via RECTAL
  Filled 2018-01-27: qty 1

## 2018-01-27 MED ORDER — POLYETHYLENE GLYCOL 3350 17 G PO PACK
17.0000 g | PACK | Freq: Every day | ORAL | Status: DC | PRN
  Administered 2018-01-28 – 2018-01-31 (×3): 17 g via ORAL
  Filled 2018-01-27 (×3): qty 1

## 2018-01-27 MED ORDER — MORPHINE SULFATE (PF) 2 MG/ML IV SOLN: 2.0000 mg | INTRAVENOUS | Status: DC | PRN

## 2018-01-27 MED ORDER — ACETAMINOPHEN 325 MG PO TABS
325.0000 mg | ORAL_TABLET | ORAL | Status: DC | PRN
  Administered 2018-02-01 – 2018-02-03 (×3): 650 mg via ORAL
  Administered 2018-02-04 – 2018-02-05 (×4): 325 mg via ORAL
  Filled 2018-01-27: qty 2
  Filled 2018-01-27 (×2): qty 1
  Filled 2018-01-27 (×3): qty 2

## 2018-01-27 MED ORDER — RIVAROXABAN 20 MG PO TABS: 20.0000 mg | ORAL_TABLET | Freq: Every day | ORAL | 1 refills | Status: DC
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
## 2018-01-27 NOTE — Progress Notes (Signed)
Patient ID: Barbarann EhlersGary S Dollens, male   DOB: 01/02/1961, 57 y.o.   MRN: [id number] Patient admitted to [id number] via bed, escorted by nursing staff and family.  Patient and spouse verbalized understanding of rehab process, specifically fall prevention policy, personal belongings policy.  Patient appears to be in no immediate distress at this time.  Dani Gobbleeardon, Maurya Nethery J, RN
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
## 2018-01-27 NOTE — Progress Notes (Signed)
Report called to 4W. Pt transported with all belongings.  Versie StarksHanna  Julies Carmickle, RN
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
## 2018-01-27 NOTE — Discharge Instructions (Addendum)
[facility]  Discharge instructions  Lower Extremity Bypass Surgery  Please refer to the following instruction for your post-procedure care. Your surgeon or physician assistant will discuss any changes with you.  Activity  You are encouraged to walk as much as you can. You can slowly return to normal activities during the month after your surgery. Avoid strenuous activity and heavy lifting until your doctor tells you it's OK. Avoid activities such as vacuuming or swinging a golf club. Do not drive until your doctor give the OK and you are no longer taking prescription pain medications. It is also normal to have difficulty with sleep habits, eating and bowel movement after surgery. These will go away with time.  Bathing/Showering  You may shower after you go home. Do not soak in a bathtub, hot tub, or swim until the incision heals completely.  Incision Care  Clean your incision with mild soap and water. Shower every day. Pat the area dry with a clean towel. You do not need a bandage unless otherwise instructed. Do not apply any ointments or creams to your incision. If you have open wounds you will be instructed how to care for them or a visiting nurse may be arranged for you. If you have staples or sutures along your incision they will be removed at your post-op appointment. You may have skin glue on your incision. Do not peel it off. It will come off on its own in about one week. If you have a great deal of moisture in your groin, use a gauze help keep this area dry.  Diet  Resume your normal diet. There are no special food restrictions following this procedure. A low fat/ low cholesterol diet is recommended for all patients with vascular disease. In order to heal from your surgery, it is CRITICAL to get adequate nutrition. Your body requires vitamins, minerals, and protein. Vegetables are the best source of vitamins and minerals. Vegetables also provide the  perfect balance of protein. Processed food has little nutritional value, so try to avoid this.  Medications  Resume taking all your medications unless your doctor or nurse practitioner tells you not to. If your incision is causing pain, you may take over-the-counter pain relievers such as acetaminophen (Tylenol). If you were prescribed a stronger pain medication, please aware these medication can cause nausea and constipation. Prevent nausea by taking the medication with a snack or meal. Avoid constipation by drinking plenty of fluids and eating foods with high amount of fiber, such as fruits, vegetables, and grains. Take Colase 100 mg (an over-the-counter stool softener) twice a day as needed for constipation. Do not take Tylenol if you are taking prescription pain medications.  Follow Up  Our office will schedule a follow up appointment 2-3 weeks following discharge.  Please call us immediately for any of the following conditions  Severe or worsening pain in your legs or feet while at rest or while walking Increase pain, redness, warmth, or drainage (pus) from your incision site(s) Fever of 101 degree or higher The swelling in your leg with the bypass suddenly worsens and becomes more painful than when you were in the hospital If you have been instructed to feel your graft pulse then you should do so every day. If you can no longer feel this pulse, call the office immediately. Not all patients are given this instruction.  Leg swelling is common after leg bypass surgery.  The swelling should improve over a few months  following surgery. To improve the swelling, you may elevate your legs above the level of your heart while you are sitting or resting. Your surgeon or physician assistant may ask you to apply an ACE wrap or wear compression (TED) stockings to help to reduce swelling.  Reduce your risk of vascular disease  Stop smoking. If you would like help call [facility] at 1-800-QUIT-NOW  ([phone] 02011-630-116-6184) or [place] at [phone].  Manage your cholesterol Maintain a desired weight Control your diabetes weight Control your diabetes Keep your blood pressure down  If you have any questions, please call the office at [phone]  - - - - - - - - - - - - -  - - - - - - - - - - - - - - - - - - - - - - - - - - - - - -  Information on my medicine - XARELTO (rivaroxaban)  WHY WAS XARELTO PRESCRIBED FOR YOU? Xarelto was prescribed to treat blood clots that may have been found in the veins of your legs (deep vein thrombosis) or in your lungs (pulmonary embolism) and to reduce the risk of them occurring again.  What do you need to know about Xarelto? The starting dose is one 15 mg tablet taken TWICE daily with food for the FIRST 21 DAYS then on September 6th, 2019 (02/17/2018)  the dose is changed to one 20 mg tablet taken ONCE A DAY with your evening meal.  DO NOT stop taking Xarelto without talking to the health care provider who prescribed the medication.  Refill your prescription for 20 mg tablets before you run out.  After discharge, you should have regular check-up appointments with your healthcare provider that is prescribing your Xarelto.  In the future your dose may need to be changed if your kidney function changes by a significant amount.  What do you do if you miss a dose? If you are taking Xarelto TWICE DAILY and you miss a dose, take it as soon as you remember. You may take two 15 mg tablets (total 30 mg) at the same time then resume your regularly scheduled 15 mg twice daily the next day.  If you are taking Xarelto ONCE DAILY and you miss a dose, take it as soon as you remember on the same day then continue your regularly scheduled once daily regimen the next day. Do not take two doses of Xarelto at the same time.   Important Safety Information Xarelto is a blood thinner medicine that can cause bleeding. You should call your healthcare provider right  away if you experience any of the following: ? Bleeding from an injury or your nose that does not stop. ? Unusual colored urine (red or dark brown) or unusual colored stools (red or black). ? Unusual bruising for unknown reasons. ? A serious fall or if you hit your head (even if there is no bleeding).  Some medicines may interact with Xarelto and might increase your risk of bleeding while on Xarelto. To help avoid this, consult your healthcare provider or pharmacist prior to using any new prescription or non-prescription medications, including herbals, vitamins, non-steroidal anti-inflammatory drugs (NSAIDs) and supplements.  This website has more information on Xarelto: VisitDestination.com.[email or web address].xarelto.com.

## 2018-01-27 NOTE — Care Management Important Message (Signed)
Important Message  Patient Details  Name: Michael [name] MRN: [id number] Date of Birth: 05/22/1961   Medicare Important Message Given:  Yes    Akeia Perot P Que Meneely 01/27/2018, 4:07 PM

## 2018-01-27 NOTE — Care Management Note (Signed)
Case Management Note Donn PieriniKristi Edilia Ghuman RN, BSN Unit 4E- RN Care Coordinator  [phone]  Patient Details  Name: Michael EhlersGary S Vandevender MRN: [id number] Date of Birth: 02/12/1961  Subjective/Objective:  Pt admitted s/p right popliteal and tibial embolectomy #2 exploration right anterior tibial artery #3 left femoral to below-knee popliteal bypass (PTFE) #4 left popliteal and tibial endarterectomy and Evacuation of left popliteal space hematoma 2 Days Post-Op                    Action/Plan: PTA Pt lived at home with wife- CIR consulted for possible admission, CSW following for SNF backup.   Expected Discharge Date:  01/27/18               Expected Discharge Plan:  IP Rehab Facility  In-House Referral:  Clinical Social Work  Discharge planning Services  CM Consult  Post Acute Care Choice:  IP Rehab Choice offered to:     DME Arranged:    DME Agency:     HH Arranged:    HH Agency:     Status of Service:  Completed, signed off  If discussed at [employer] of [employer] Meetings, dates discussed:    Discharge Disposition: IP rehab   Additional Comments:  01/27/18- 1430- Candise Crabtree RN, CM-  Pt is off PCA, CIR has seen pt and has bed available today for admission- pt stable for transition to CIR and [facility].   Darrold SpanWebster, Kingstin Heims Hall, RN 01/27/2018, 2:28 PM

## 2018-01-27 NOTE — Progress Notes (Addendum)
  Progress Note    01/27/2018 11:55 AM 2 Days Post-Op  Subjective:  Comfortable this morning; denies rest pain BLE   Vitals:   01/27/18 0758 01/27/18 1148  BP: 104/73 121/77  Pulse: (!) 101   Resp: (!) 29   Temp: 98.3 F (36.8 C) 97.9 F (36.6 C)  SpO2: 97%    Physical Exam: Lungs:  Non labored Incisions:  L groin incision without hematoma or drainage; L popliteal incision with some serosanguinous drainage; minimal bulb suction collection; R popliteal incision with serosanguinous drainage collection on dressing; pitting edema to level of the knee BLE L>R; R ATA incision unremarkable Extremities:  Multiphasic PTA signals by doppler Abdomen:  Soft Neurologic: A&O  CBC    Component Value Date/Time   WBC 9.7 01/27/2018 0538   RBC 2.79 (L) 01/27/2018 0538   HGB 8.0 (L) 01/27/2018 0538   HCT 24.9 (L) 01/27/2018 0538   PLT 173 01/27/2018 0538   MCV 89.2 01/27/2018 0538   MCH 28.7 01/27/2018 0538   MCHC 32.1 01/27/2018 0538   RDW 14.1 01/27/2018 0538   LYMPHSABS 1.2 01/23/2018 0944   MONOABS 0.9 01/23/2018 0944   EOSABS 0.2 01/23/2018 0944   BASOSABS 0.0 01/23/2018 0944    BMET    Component Value Date/Time   NA 133 (L) 01/25/2018 0322   K 3.6 01/25/2018 0322   CL 104 01/25/2018 0322   CO2 22 01/25/2018 0322   GLUCOSE 137 (H) 01/25/2018 0322   BUN 20 01/25/2018 0322   CREATININE 1.55 (H) 01/25/2018 0322   CREATININE 1.16 08/11/2016 1030   CALCIUM 8.1 (L) 01/25/2018 0322   GFRNONAA 48 (L) 01/25/2018 0322   GFRNONAA 70 08/11/2016 1030   GFRAA 56 (L) 01/25/2018 0322   GFRAA 81 08/11/2016 1030    INR    Component Value Date/Time   INR 1.19 01/24/2018 0421     Intake/Output Summary (Last 24 hours) at 01/27/2018 1155 Last data filed at 01/27/2018 0700 Gross per 24 hour  Intake 833.74 ml  Output 2415 ml  Net -1581.26 ml     Assessment/Plan:  57 y.o. male is s/p right popliteal and tibial embolectomy #2 exploration right anterior tibial artery #3 left  femoral to below-knee popliteal bypass (PTFE) #4 left popliteal and tibial endarterectomy and Evacuation of left popliteal space hematoma  2 Days Post-Op    D/c JP drain PCA off; pain medication adjusted Encouraged OOB Continue dry dressing changes BLE popliteal incisions daily and prn Perfusing BLE well with strong PTA signlas by doppler bilaterally Will need to transfer to oral anticoagulation prior to d/c home CIR today; if not, [facility]  DVT prophylaxis:  IV heparin  ADDENDUM: D/C HEPARIN START XARELTO 15MG  bid for 21 DAYS, then 20MG  daily D/C TO CIR TODAY  Emilie RutterMatthew Quindell Shere, PA-C [facility] [phone] 01/27/2018 11:55 AM

## 2018-01-27 NOTE — Plan of Care (Signed)
  Problem: Consults Goal: [facility] GENERAL PATIENT EDUCATION Description See Patient Education module for education specifics. Outcome: Progressing Goal: Skin Care Protocol Initiated - if Braden Score 18 or less Description If consults are not indicated, leave blank or document N/A Outcome: Progressing   

## 2018-01-27 NOTE — Progress Notes (Signed)
Inpatient Rehabilitation  Met with patient at bedside to discuss team's recommendation for IP Rehab.  Shared booklets, insurance verification letter, and answered questions.  Note that there is a discharge order in place for IP Rehab admission and have a bed to offer.  Plan to proceed with admission today.  Updated team.  Call if questions.    Carmelia Roller., CCC/SLP Admission Coordinator  [place]  Cell [phone]
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
## 2018-01-27 NOTE — Progress Notes (Signed)
ANTICOAGULATION CONSULT NOTE - Follow Up Consult  Pharmacy Consult for Heparin Indication: acute bilateral PE, RLE DVT, Ischemic limb  Allergies  Allergen Reactions  . Ivp Dye [Iodinated Diagnostic Agents] Swelling    Patient Measurements: Height: 5\' 8"  (172.7 cm) Weight: 214 lb 15.2 oz (97.5 kg) IBW/kg (Calculated) : 68.4 Heparin Dosing Weight: 89.1 kg  Vital Signs: Temp: 98.3 F (36.8 C) (08/16 0758) Temp Source: Oral (08/16 0758) BP: 104/73 (08/16 0758) Pulse Rate: 101 (08/16 0758)  Labs: Recent Labs    01/24/18 0921  01/25/18 0322 01/25/18 2110 01/26/18 0306 01/26/18 1224 01/26/18 2239 01/27/18 0538  HGB 10.1*  --  8.8*  --  8.0*  --   --  8.0*  HCT 33.3*  --  28.2*  --  25.4*  --   --  24.9*  PLT 147*  --  161  --  144*  --   --  173  HEPARINUNFRC 1.09*   < > 0.53  --  0.18* 0.15* 0.29* 0.42  CREATININE  --   --  1.55*  --   --   --   --   --   TROPONINI <0.03  --   --  <0.03 <0.03  --   --   --    < > = values in this interval not displayed.    Estimated Creatinine Clearance: 59.5 mL/min (A) (by C-G formula based on SCr of 1.55 mg/dL (H)).  Assessment: 57 yom continuing on heparin for acute bilateral PE, RLE DVT, ischemic limb, s/p OR 8/12 for embolectomy/bypass. Heparin level therapeutic this AM at 0.42 on 1450 units/hr.  Hgb down to 8.0, plt improved to wnl. No active bleed issues documented.  Goal of Therapy:  Heparin level 0.3-0.7 units/ml Monitor platelets by anticoagulation protocol: Yes   Plan: Continue IV heparin at 1450 units/hr Heparin level with AM labs Trend Hgb  Nadara MustardNita Jelani Trueba, PharmD., MS Clinical Pharmacist Pager:  [phone] Thank you for allowing pharmacy to be part of this patients care team.
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
## 2018-01-27 NOTE — Discharge Summary (Signed)
Physician Discharge Summary   Patient ID: Michael [name] [id number] y.o. 10/13/1960  Admit date: 01/23/2018  Discharge date and time: 01/27/18   Admitting Physician: Sherren Kernsharles E Fields, MD   Discharge Physician: same  Admission Diagnoses: PVD (peripheral vascular disease) (HCC) [I73.9] Pre-op evaluation [Z01.818] Lower leg DVT (deep venous thromboembolism), acute, right (HCC) [I82.4Z1] Pulmonary embolism  Discharge Diagnoses: same  Admission Condition: poor  Discharged Condition: fair  Indication for Admission: acute on chronic ischemia BLE, PE, LLE DVT  Hospital Course: Mr. Michael [name] is a 57 year old male who presented to the emergency department with acute ischemia symptoms of right lower extremity more than left.  He was admitted to the hospital by Dr. Darrick PennaFields on 01/23/2018 he was started on a heparin drip, CTA abdomen pelvis with runoff was ordered and he was kept n.p.o. CTA demonstrated occlusion from the right SFA extending distally as well as an acute occlusion of left popliteal.  CTA also demonstrated a pulmonary embolism.  He was brought emergently to the operating room for bilateral lower externally embolectomies.  He underwent R popliteal and tibial embolectomy, exploration of R ATA, left femoral to BK popliteal bypass with PTFE and left popliteal and tibial endarterectomy.  He tolerated this procedure well and was admitted to the hospital.  IV heparin was uninterrupted perioperatively.  POD #1 patient was explaining of severe pain in left foot as well as tightness in his left calf.  He was noted to have a large hematoma in left popliteal space.  Bilateral lower extremity arterial duplexes ordered as patient was also complaining of pain in right foot.  Left femoral to popliteal bypass was patent by duplex and runoff to foot on right lower extremity was also demonstrated by duplex.  He was brought back to the operating room by Dr. Chestine Sporelark for evacuation of hematoma of left  popliteal space and placement of JP drain.  Physical therapy and Occupational Therapy were consulted postoperatively.  Recommendations and eventually involved inpatient rehabilitation.  The remainder of hospital stay involved controlling pain starting with PCA and eventually weaning off.  JP drain was removed from left calf 2 days after evacuation of hematoma.  Patient was also transitioned from IV heparin to Xarelto.  At the time of discharge patient has strong posterior tibial artery signals in bilateral lower extremities.  All incisions appear to be healing well.  Today, patient had received authorization for inpatient rehabilitation and a bed was available.  Discharge instructions were reviewed with the patient and he voiced his understanding.  If patient is still hospitalized in 2 weeks we will remove staples in the hospital, if not he will follow-up in office with Dr. Darrick PennaFields in 2 weeks for staple removal and to evaluate circulation.  He will continue Xarelto indefinitely as he also has a diagnosis of paroxysmal atrial fibrillation on his medical record.  He will be discharged to inpatient rehabilitation this afternoon in stable condition.  Consults: None  Treatments: surgery: R popliteal and tibial embolectomy, exploration of R ATA, left femoral to BK popliteal bypass with PTFE and left popliteal and tibial endarterectomy by Dr. Darrick PennaFields on 01/23/18  Evacuation of hematoma of left popliteal space by Dr. Chestine Sporelark 01/25/18  Discharge Exam: see progress note 01/27/18 Vitals:   01/27/18 1148 01/27/18 1234  BP: 121/77   Pulse:    Resp:  14  Temp: 97.9 F (36.6 C)   SpO2:  98%     Disposition: Discharge disposition: 90-DC/txfr to inpt rehab facility with planned acute care  hosp IP admission       - For VQI Registry use ---  Post-op:  Wound infection: No  Graft infection: No  Transfusion: No  If yes,  units given New Arrhythmia: No Ipsilateral amputation: [x ] no, [ ]  Minor, [ ]  BKA, [ ]   AKA Patency judged by: [x ] Dopper only, [ ]  Palpable graft pulse, [ ]  Palpable distal pulse, [ ]  ABI inc. > 0.15, [ ]  Duplex D/C Ambulatory Status: Ambulatory with Assistance  Complications: MI: [ x] No, [ ]  Troponin only, [ ]  EKG or Clinical CHF: No Resp failure: [x ] none, [ ]  Pneumonia, [ ]  Ventilator Chg in renal function: [x ] none, [ ]  Inc. Cr > 0.5, [ ]  Temp. Dialysis, [ ]  Permanent dialysis Stroke: [ x] None, [ ]  Minor, [ ]  Major Return to OR: Yes  Reason for return to OR: [x ] Bleeding, [ ]  Infection, [ ]  Thrombosis, [ ]  Revision  Discharge medications: Statin use:  Yes ASA use:  Yes Plavix use:  No  for medical reason not indicated Beta blocker use: Yes Coumadin use: Yes, Xarelto    Patient Instructions:  Allergies as of 01/27/2018      Reactions   Ivp Dye [iodinated Diagnostic Agents] Swelling      Medication List    STOP taking these medications   methocarbamol 500 MG tablet Commonly known as:  ROBAXIN     TAKE these medications   acetaminophen 500 MG tablet Commonly known as:  TYLENOL Take 2 tablets (1,000 mg total) by mouth every 8 (eight) hours as needed for moderate pain.   aspirin EC 81 MG tablet Take 81 mg by mouth daily.   lisinopril 20 MG tablet Commonly known as:  PRINIVIL,ZESTRIL TAKE 1 TABLET BY MOUTH ONCE DAILY What changed:    how much to take  how to take this  when to take this  additional instructions   metoprolol tartrate 50 MG tablet Commonly known as:  LOPRESSOR Take 1 tablet (50 mg total) by mouth 2 (two) times daily.   nitroGLYCERIN 0.4 MG SL tablet Commonly known as:  NITROSTAT Place 1 tablet (0.4 mg total) under the tongue every 5 (five) minutes as needed for chest pain.   Rivaroxaban 15 & 20 MG Tbpk Start with one 15mg  tablet by mouth twice a day with food. On Day 22, switch to one 20mg  tablet once a day with food.   rivaroxaban 20 MG Tabs tablet Commonly known as:  XARELTO Take 1 tablet (20 mg total) by mouth  daily with supper. Start taking on:  02/17/2018   rosuvastatin 40 MG tablet Commonly known as:  CRESTOR Take 1 tablet (40 mg total) by mouth daily.      Activity: activity as tolerated Diet: regular diet Wound Care: keep wound clean and dry  Follow-up with Dr. Darrick PennaFields in 2 weeks.  SignedEmilie Rutter: Blu Lori 01/27/2018 2:28 PM

## 2018-01-28 ENCOUNTER — Inpatient Hospital Stay (HOSPITAL_COMMUNITY): Payer: Medicare Other | Admitting: Occupational Therapy

## 2018-01-28 ENCOUNTER — Inpatient Hospital Stay (HOSPITAL_COMMUNITY): Payer: Medicare Other | Admitting: Physical Therapy

## 2018-01-28 DIAGNOSIS — I10 Essential (primary) hypertension: Secondary | ICD-10-CM

## 2018-01-28 DIAGNOSIS — I70209 Unspecified atherosclerosis of native arteries of extremities, unspecified extremity: Secondary | ICD-10-CM

## 2018-01-28 DIAGNOSIS — I97638 Postprocedural hematoma of a circulatory system organ or structure following other circulatory system procedure: Secondary | ICD-10-CM

## 2018-01-28 MED ORDER — RIVAROXABAN 20 MG PO TABS: 20.0000 mg | ORAL_TABLET | Freq: Every day | ORAL | Status: DC

## 2018-01-28 MED ORDER — RIVAROXABAN 15 MG PO TABS
15.0000 mg | ORAL_TABLET | Freq: Two times a day (BID) | ORAL | Status: DC
  Administered 2018-01-28 – 2018-02-11 (×28): 15 mg via ORAL
  Filled 2018-01-28 (×28): qty 1

## 2018-01-28 NOTE — Progress Notes (Signed)
Per Rehab MD consult, patient to be transitioned to xarelto BID.  Patient received one dose of xarelto yesterday before transferring here.  We currently have no orders to administer xarelto.  Notified Dr. Wynn BankerKirsteins this morning.  He will review chart and provide clarification.  Will continue to monitor.  Dani Gobbleeardon, Leshia Kope J, RN

## 2018-01-28 NOTE — Progress Notes (Signed)
Occupational Therapy Note  Patient Details  Name: Barbarann EhlersGary S Galvan MRN: [id number] Date of Birth: 05/24/1961  Today's Date: 01/28/2018 OT Missed Time: 30 Minutes Missed Time Reason: Patient fatigue;Pain  Pt missed 30 mins scheduled OT treatment session due to pain and fatigue.  Pt asleep upon arrival and when awakened reports feeling he might have overdone it during AM sessions.  Pt reports having recently received pain meds and wishing to rest to allow increased participation in PT session.  Will continue to follow as able.   Rosalio LoudHOXIE, Takima Encina 01/28/2018, 2:39 PM

## 2018-01-28 NOTE — Progress Notes (Signed)
Subjective/Complaints:   Objective: Vital Signs: Blood pressure 125/76, pulse 82, temperature 98 F (36.7 C), temperature source Oral, resp. rate 18, height 5\' 8"  (1.727 m), weight 113.6 kg, SpO2 100 %. No results found. Results for orders placed or performed during the hospital encounter of 01/23/18 (from the past 72 hour(s))  Prepare RBC     Status: None   Collection Time: 01/25/18  3:10 PM  Result Value Ref Range   Order Confirmation      ORDER PROCESSED BY BLOOD BANK Performed at [facility], [address] [place], [place] [address]   Troponin I (q 6hr x 3)     Status: None   Collection Time: 01/25/18  9:10 PM  Result Value Ref Range   Troponin I <0.03 <0.03 ng/mL    Comment: Performed at [facility], [address] [place], [place] [address]  Troponin I (q 6hr x 3)     Status: None   Collection Time: 01/26/18  3:06 AM  Result Value Ref Range   Troponin I <0.03 <0.03 ng/mL    Comment: Performed at [facility], [address] [place], [place] [address]  CBC     Status: Abnormal   Collection Time: 01/26/18  3:06 AM  Result Value Ref Range   WBC 12.6 (H) 4.0 - 10.5 K/uL   RBC 2.89 (L) 4.22 - 5.81 MIL/uL   Hemoglobin 8.0 (L) 13.0 - 17.0 g/dL   HCT 91.425.4 (L) 78.239.0 - 95.652.0 %   MCV 87.9 78.0 - 100.0 fL   MCH 27.7 26.0 - 34.0 pg   MCHC 31.5 30.0 - 36.0 g/dL   RDW 21.313.9 08.611.5 - 57.815.5 %   Platelets 144 (L) 150 - 400 K/uL    Comment: Performed at [facility], [address] [place], [place] [address]  Heparin level (unfractionated)     Status: Abnormal   Collection Time: 01/26/18  3:06 AM  Result Value Ref Range   Heparin Unfractionated 0.18 (L) 0.30 - 0.70 IU/mL    Comment: (NOTE) If heparin results are below expected values, and patient dosage has  been confirmed, suggest follow up testing of antithrombin III levels. Performed at [facility], [address] [place], [place] [address]   Heparin level (unfractionated)     Status: Abnormal    Collection Time: 01/26/18 12:24 PM  Result Value Ref Range   Heparin Unfractionated 0.15 (L) 0.30 - 0.70 IU/mL    Comment: (NOTE) If heparin results are below expected values, and patient dosage has  been confirmed, suggest follow up testing of antithrombin III levels. Performed at [facility], [address] [place], [place] [address]   Heparin level (unfractionated)     Status: Abnormal   Collection Time: 01/26/18 10:39 PM  Result Value Ref Range   Heparin Unfractionated 0.29 (L) 0.30 - 0.70 IU/mL    Comment: (NOTE) If heparin results are below expected values, and patient dosage has  been confirmed, suggest follow up testing of antithrombin III levels. Performed at [facility], [address] [place], [place] [address]   CBC     Status: Abnormal   Collection Time: 01/27/18  5:38 AM  Result Value Ref Range   WBC 9.7 4.0 - 10.5 K/uL   RBC 2.79 (L) 4.22 - 5.81 MIL/uL   Hemoglobin 8.0 (L) 13.0 - 17.0 g/dL   HCT 25.324.9 (L) 66.439.0 - 40.352.0 %   MCV 89.2 78.0 -  100.0 fL   MCH 28.7 26.0 - 34.0 pg   MCHC 32.1 30.0 - 36.0 g/dL   RDW 16.114.1 09.611.5 - 04.515.5 %   Platelets 173 150 - 400 K/uL    Comment: Performed at [facility], [address] [place], [place] [address]  Heparin level (unfractionated)     Status: None   Collection Time: 01/27/18  5:38 AM  Result Value Ref Range   Heparin Unfractionated 0.42 0.30 - 0.70 IU/mL    Comment: (NOTE) If heparin results are below expected values, and patient dosage has  been confirmed, suggest follow up testing of antithrombin III levels. Performed at [facility], [address] [place], [place] [address]      HEENT: normal Cardio: RRR and No murmur Resp: CTA B/L GI: BS positive and Nontender nondistended Extremity:  No Edema Skin:   Wound Bilateral medial calf incisions with staples in place mild serosanguineous drainage right dorsalis pedis incision clean dry and intact, left groin incision inguinal with  Dermabond clean dry and intact Neuro: Alert/Oriented, Abnormal Sensory Reduced sensation at the toes but able to distinguish light touch and Abnormal Motor 5/5 bilateral deltoid, bicep, tricep, grip, 3- at the hip flexors knee extensors 4- ankle plantar flexor dorsiflexor on the left and 3- on the right Musc/Skel:  Normal General no acute distress   Assessment/Plan: 1. Functional deficits secondary to peripheral vascular disease status post right popliteal and tibial embolectomy exploration of the right anterior tibial artery, left femoral to left below-knee popliteal bypass and left popliteal and tibial endarterectomy 01/24/2018 complicated by left popliteal hematoma which require 3+ hours per day of interdisciplinary therapy in a comprehensive inpatient rehab setting. Physiatrist is providing close team supervision and 24 hour management of active medical problems listed below. Physiatrist and rehab team continue to assess barriers to discharge/monitor patient progress toward functional and medical goals. FIM:                                  Medical Problem List and Plan: 1.Debilitysecondary to peripheral vascular disease status post right popliteal and tibial embolectomy exploration right anterior tibial artery left femoral to below-knee popliteal bypass and left popliteal and tibial enterectomy 01/24/2018 complicated by hematoma with evacuation.Pt also with peripheral neuropathy -CIR PT, OT evals today 2. DVT Prophylaxis/Anticoagulation/pulmonary emboli:Xarelto 3. Pain Management:Oxycodone as needed, may need to schedule with therapies 4. Mood:Provide emotional support 5. Neuropsych: This patientiscapable of making decisions on hisown behalf. 6. Skin/Wound Care:Routine skin checks -continue local wound care to op-sites/lower extremities 7. Fluids/Electrolytes/Nutrition:Routine in and outs with follow-up chemistries 8.Acute blood  loss anemia. Follow-up CBC 9.Renal insufficiency. Baseline creatinine 1.27-1.55 10.Hypertension. Monitor with increased mobility. Patient on lisinopril 20 mg daily, Lopressor 50 mg twice daily prior to admission.  - Vitals:   01/27/18 1943 01/28/18 0519  BP: 112/73 125/76  Pulse: (!) 101 82  Resp: 18 18  Temp: 98.4 F (36.9 C) 98 F (36.7 C)  SpO2: 100% 100%  If blood pressure readings go up will need to resume blood pressure medications 11.CAD. No chest pain or shortness of breath 12.History of alcohol use. Provide counseling, no signs of withdrawal 13.Constipation. Laxative assistance  LOS (Days) 1 A FACE TO FACE EVALUATION WAS PERFORMED  Erick Colacendrew E Eimi Viney 01/28/2018, 11:53 AM
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
## 2018-01-28 NOTE — Plan of Care (Signed)
  Problem: Consults Goal: RH GENERAL PATIENT EDUCATION Description See Patient Education module for education specifics. Outcome: Progressing Goal: Skin Care Protocol Initiated - if Braden Score 18 or less Description If consults are not indicated, leave blank or document N/A Outcome: Progressing   Problem: RH SKIN INTEGRITY Goal: RH STG SKIN FREE OF INFECTION/BREAKDOWN Description Patients skin will remain free from further infection or breakdown with mod I assist.  Outcome: Progressing Goal: RH STG MAINTAIN SKIN INTEGRITY WITH ASSISTANCE Description STG Maintain Skin Integrity With mod I Assistance.  Outcome: Progressing Goal: RH STG ABLE TO PERFORM INCISION/WOUND CARE W/ASSISTANCE Description STG Able To Perform Incision/Wound Care With mod Assistance.  Outcome: Progressing   Problem: RH SAFETY Goal: RH STG ADHERE TO SAFETY PRECAUTIONS W/ASSISTANCE/DEVICE Description STG Adhere to Safety Precautions With mod I  Assistance/Device.  Outcome: Progressing Goal: RH STG DECREASED RISK OF FALL WITH ASSISTANCE Description STG Decreased Risk of Fall With mod I Assistance.  Outcome: Progressing   Problem: RH PAIN MANAGEMENT Goal: RH STG PAIN MANAGED AT OR BELOW PT'S PAIN GOAL Description < 4  Outcome: Progressing   Problem: RH KNOWLEDGE DEFICIT GENERAL Goal: RH STG INCREASE KNOWLEDGE OF SELF CARE AFTER HOSPITALIZATION Description Min assist  Outcome: Progressing
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
## 2018-01-28 NOTE — Progress Notes (Signed)
Physical Therapy Session Note  Patient Details  Name: Michael [name] MRN: [id number] Date of Birth: 12/17/1960  Today's Date: 01/28/2018 PT Individual Time: 1415-1435 PT Individual Time Calculation (min): 20 min   Short Term Goals: Week 1:  PT Short Term Goal 1 (Week 1): STGs = LTGs  Skilled Therapeutic Interventions/Progress Updates:  Pt was seen bedside in the pm. PT c/o pain but willing to attempt to participate with therapy. Pt transferred supine to edge of bed with S and side rail with increased time. Pt tolerated edge of bed about 5 minutes however unable to continue secondary to pain. Pt transferred edge of bed to supine with side rail and S with increased time. Following treatment discussed with pt's nurse in regards to pain limiting participation with therapy.   Therapy Documentation Precautions:  Precautions Precautions: Fall Restrictions Weight Bearing Restrictions: No General: PT Amount of Missed Time (min): 25 Minutes PT Missed Treatment Reason: Patient fatigue;Pain Vital Signs: Pain: Pt c/o pain 10/10 B LEs.  See Function Navigator for Current Functional Status.   Therapy/Group: Individual Therapy  Michael [name], Michael [name] 01/28/2018, 3:17 PM

## 2018-01-29 ENCOUNTER — Inpatient Hospital Stay (HOSPITAL_COMMUNITY): Payer: Medicare Other

## 2018-01-29 MED ORDER — OXYCODONE HCL ER 10 MG PO T12A
10.0000 mg | EXTENDED_RELEASE_TABLET | Freq: Two times a day (BID) | ORAL | Status: DC
  Administered 2018-01-29 – 2018-02-01 (×7): 10 mg via ORAL
  Filled 2018-01-29 (×7): qty 1

## 2018-01-29 NOTE — Progress Notes (Signed)
Subjective/Complaints:  Patient complains of right lower extremity pain.  He states that the pain medication does not last very long perhaps 2-1/2 hours.  We discussed controlled release medications. Patient also indicates that a clot came out of the left calf wound during dressing change.  There is some oozing of serosanguineous fluid  Review of systems denies chest pain, shortness of breath, nausea, Vomiting, diarrhea, constipation Objective: Vital Signs: Blood pressure 133/79, pulse 85, temperature 98.5 F (36.9 C), temperature source Oral, resp. rate 18, height 5\' 8"  (1.727 m), weight 113.6 kg, SpO2 99 %. No results found. Results for orders placed or performed during the hospital encounter of 01/23/18 (from the past 72 hour(s))  Heparin level (unfractionated)     Status: Abnormal   Collection Time: 01/26/18 12:24 PM  Result Value Ref Range   Heparin Unfractionated 0.15 (L) 0.30 - 0.70 IU/mL    Comment: (NOTE) If heparin results are below expected values, and patient dosage has  been confirmed, suggest follow up testing of antithrombin III levels. Performed at [facility], [address] [place], [place] [address]   Heparin level (unfractionated)     Status: Abnormal   Collection Time: 01/26/18 10:39 PM  Result Value Ref Range   Heparin Unfractionated 0.29 (L) 0.30 - 0.70 IU/mL    Comment: (NOTE) If heparin results are below expected values, and patient dosage has  been confirmed, suggest follow up testing of antithrombin III levels. Performed at [facility], [address] [place], [place] [address]   CBC     Status: Abnormal   Collection Time: 01/27/18  5:38 AM  Result Value Ref Range   WBC 9.7 4.0 - 10.5 K/uL   RBC 2.79 (L) 4.22 - 5.81 MIL/uL   Hemoglobin 8.0 (L) 13.0 - 17.0 g/dL   HCT 19.124.9 (L) 47.839.0 - 29.552.0 %   MCV 89.2 78.0 - 100.0 fL   MCH 28.7 26.0 - 34.0 pg   MCHC 32.1 30.0 - 36.0 g/dL   RDW 62.114.1 30.811.5 - 65.715.5 %   Platelets 173 150 - 400 K/uL   Comment: Performed at [facility], [address] [place], [place] [address]  Heparin level (unfractionated)     Status: None   Collection Time: 01/27/18  5:38 AM  Result Value Ref Range   Heparin Unfractionated 0.42 0.30 - 0.70 IU/mL    Comment: (NOTE) If heparin results are below expected values, and patient dosage has  been confirmed, suggest follow up testing of antithrombin III levels. Performed at [facility], [address] [place], [place] [address]      HEENT: normal Cardio: RRR and No murmur Resp: CTA B/L GI: BS positive and Nontender nondistended Extremity:  No Edema Skin:   Wound Bilateral medial calf incisions with staples in place mild serosanguineous drainage right dorsalis pedis incision clean dry and intact, left groin incision inguinal with Dermabond clean dry and intact Neuro: Alert/Oriented, Abnormal Sensory Reduced sensation at the toes but able to distinguish light touch and Abnormal Motor 5/5 bilateral deltoid, bicep, tricep, grip, 3- at the hip flexors knee extensors 4- ankle plantar flexor dorsiflexor on the left and 3- on the right Musc/Skel:  Normal General no acute distress   Assessment/Plan: 1. Functional deficits secondary to peripheral vascular disease status post right popliteal and tibial embolectomy exploration of the right anterior tibial artery, left femoral to left below-knee popliteal bypass and left popliteal and tibial endarterectomy 01/24/2018 complicated by left popliteal  hematoma which require 3+ hours per day of interdisciplinary therapy in a comprehensive inpatient rehab setting. Physiatrist is providing close team supervision and 24 hour management of active medical problems listed below. Physiatrist and rehab team continue to assess barriers to discharge/monitor patient progress toward functional and medical goals. FIM: Function - Bathing Position: Bed Body parts bathed by patient: Right arm, Left arm, Chest, Abdomen, Front  perineal area, Buttocks, Right upper leg, Left upper leg Body parts bathed by helper: Right lower leg, Left lower leg, Back Assist Level: Touching or steadying assistance(Pt > 75%)  Function- Upper Body Dressing/Undressing What is the patient wearing?: Pull over shirt/dress Pull over shirt/dress - Perfomed by patient: Thread/unthread right sleeve, Thread/unthread left sleeve, Put head through opening, Pull shirt over trunk Assist Level: Set up Set up : To obtain clothing/put away Function - Lower Body Dressing/Undressing What is the patient wearing?: Underwear, Pants, Non-skid slipper socks Position: Sitting EOB Underwear - Performed by patient: Thread/unthread left underwear leg, Pull underwear up/down Underwear - Performed by helper: Thread/unthread right underwear leg Pants- Performed by patient: Thread/unthread left pants leg, Pull pants up/down Pants- Performed by helper: Thread/unthread right pants leg Non-skid slipper socks- Performed by helper: Don/doff right sock, Don/doff left sock Assist for footwear: Maximal assist Assist for lower body dressing: (Mod assist)  Function - Toileting Toileting steps completed by patient: Adjust clothing prior to toileting, Performs perineal hygiene, Adjust clothing after toileting Toileting Assistive Devices: Grab bar or rail Assist level: More than reasonable time, Supervision or verbal cues  Function - Archivist transfer activity did not occur: Refused Toilet transfer assistive device: Elevated toilet seat/BSC over toilet, Grab bar, Cane Assist level to toilet: Supervision or verbal cues Assist level from toilet: Supervision or verbal cues  Function - Chair/bed transfer Chair/bed transfer method: Stand pivot, Ambulatory Chair/bed transfer assist level: Touching or steadying assistance (Pt > 75%) Chair/bed transfer assistive device: Environmental consultant, Armrests  Function - Locomotion: Wheelchair Will patient use wheelchair at  discharge?: No Wheelchair activity did not occur: Safety/medical concerns Wheel 50 feet with 2 turns activity did not occur: Safety/medical concerns Wheel 150 feet activity did not occur: Safety/medical concerns Function - Locomotion: Ambulation Assistive device: Walker-rolling Max distance: 10 ft Assist level: Touching or steadying assistance (Pt > 75%) Assist level: Touching or steadying assistance (Pt > 75%) Walk 50 feet with 2 turns activity did not occur: Safety/medical concerns Walk 150 feet activity did not occur: Safety/medical concerns Walk 10 feet on uneven surfaces activity did not occur: Safety/medical concerns  Function - Comprehension Comprehension: Auditory Comprehension assist level: Follows basic conversation/direction with no assist  Function - Expression Expression: Verbal Expression assist level: Expresses basic needs/ideas: With no assist  Function - Social Interaction Social Interaction assist level: Interacts appropriately with others with medication or extra time (anti-anxiety, antidepressant).  Function - Problem Solving Problem solving assist level: Solves basic problems with no assist  Function - Memory Memory assist level: More than reasonable amount of time Patient normally able to recall (first 3 days only): Current season, Location of own room, Staff names and faces, That he or she is in a hospital  Medical Problem List and Plan: 1.Debilitysecondary to peripheral vascular disease status post right popliteal and tibial embolectomy exploration right anterior tibial artery left femoral to below-knee popliteal bypass and left popliteal and tibial enterectomy 01/24/2018 complicated by hematoma with evacuation.Pt also with peripheral neuropathy -CIR PT, OT  2. DVT Prophylaxis/Anticoagulation/pulmonary emboli:Xarelto, monitor for blood loss given history of hematoma left  popliteal space 3. Pain Management:Oxycodone as needed, may need to  schedule with therapies 4. Mood:Provide emotional support 5. Neuropsych: This patientiscapable of making decisions on hisown behalf. 6. Skin/Wound Care:Routine skin checks -continue local wound care to op-sites/lower extremities 7. Fluids/Electrolytes/Nutrition:Routine in and outs with follow-up chemistries 8.Acute blood loss anemia. Follow-up CBC on 01/30/2018 9.Renal insufficiency. Baseline creatinine 1.27-1.55 10.Hypertension. Monitor with increased mobility. Patient on lisinopril 20 mg daily, Lopressor 50 mg twice daily prior to admission.  - Vitals:   01/28/18 1930 01/29/18 0448  BP: (!) 148/75 133/79  Pulse: 99 85  Resp: 18 18  Temp: 98.3 F (36.8 C) 98.5 F (36.9 C)  SpO2: 100% 99%  BP currently in a good range 11.CAD. No chest pain or shortness of breath 12.History of alcohol use. Provide counseling, no signs of withdrawal 13.Constipation. Laxative assistance, last BM 8/18 at 9 AM  LOS (Days) 2 A FACE TO FACE EVALUATION WAS PERFORMED  Erick Colacendrew E Daxx Tiggs 01/29/2018, 11:11 AM

## 2018-01-29 NOTE — Progress Notes (Signed)
Physical Therapy Session Note  Patient Details  Name: Barbarann EhlersGary S Frink MRN: [id number] Date of Birth: 06/12/1961  Today's Date: 01/29/2018 PT Individual Time: 0900-1000 PT Individual Time Calculation (min): 60 min   Short Term Goals: Week 1:  PT Short Term Goal 1 (Week 1): STGs = LTGs  Skilled Therapeutic Interventions/Progress Updates:    Pt supine in bed upon PT arrival, agreeable to therapy tx and reports pain 8/10 in B LEs. Pt eager to try getting up and going to bathroom despite pain. Pt transferred to EOB with supervision. Pt performed sit<>stand with min assist and RW from bed. Pt ambulated x 10 ft from bed<>bathroom with CGA and increased time, decreased step length and wide BOS. Pt performed toileting and clothing management with supervision. Pt performed sit<>stand from toilet with min assist and ambulated to w/c. Pt worked on w/c propulsion this session secondary to LE pain. Pt propelled w/c x 150 ft with B UEs and supervision. Pt seated in w/c in room with set up at the sink to brush teeth/shave. Pt left with needs in reach.   Therapy Documentation Precautions:  Precautions Precautions: Fall Restrictions Weight Bearing Restrictions: No   See Function Navigator for Current Functional Status.   Therapy/Group: Individual Therapy  Cresenciano GenreEmily van Schagen, PT, DPT 01/29/2018, 7:46 AM

## 2018-01-30 ENCOUNTER — Telehealth: Payer: Self-pay | Admitting: Vascular Surgery

## 2018-01-30 ENCOUNTER — Inpatient Hospital Stay (HOSPITAL_COMMUNITY): Payer: Medicare Other

## 2018-01-30 LAB — CBC WITH DIFFERENTIAL/PLATELET
Abs Immature Granulocytes: 0.1 10*3/uL (ref 0.0–0.1)
Basophils Absolute: 0 10*3/uL (ref 0.0–0.1)
Basophils Relative: 0 %
EOS ABS: 0.2 10*3/uL (ref 0.0–0.7)
EOS PCT: 2 %
HEMATOCRIT: 25.7 % — AB (ref 39.0–52.0)
HEMOGLOBIN: 7.8 g/dL — AB (ref 13.0–17.0)
Immature Granulocytes: 1 %
LYMPHS ABS: 1.3 10*3/uL (ref 0.7–4.0)
LYMPHS PCT: 16 %
MCH: 27.7 pg (ref 26.0–34.0)
MCHC: 30.4 g/dL (ref 30.0–36.0)
MCV: 91.1 fL (ref 78.0–100.0)
Monocytes Absolute: 0.8 10*3/uL (ref 0.1–1.0)
Monocytes Relative: 9 %
Neutro Abs: 6.1 10*3/uL (ref 1.7–7.7)
Neutrophils Relative %: 72 %
Platelets: 278 10*3/uL (ref 150–400)
RBC: 2.82 MIL/uL — ABNORMAL LOW (ref 4.22–5.81)
RDW: 14.5 % (ref 11.5–15.5)
WBC: 8.5 10*3/uL (ref 4.0–10.5)

## 2018-01-30 LAB — COMPREHENSIVE METABOLIC PANEL
ALBUMIN: 3 g/dL — AB (ref 3.5–5.0)
ALK PHOS: 105 U/L (ref 38–126)
ALT: 100 U/L — ABNORMAL HIGH (ref 0–44)
ANION GAP: 6 (ref 5–15)
AST: 60 U/L — ABNORMAL HIGH (ref 15–41)
BUN: 13 mg/dL (ref 6–20)
CALCIUM: 9.2 mg/dL (ref 8.9–10.3)
CO2: 32 mmol/L (ref 22–32)
Chloride: 100 mmol/L (ref 98–111)
Creatinine, Ser: 1.15 mg/dL (ref 0.61–1.24)
GFR calc Af Amer: >60 mL/min (ref >60)
GFR calc non Af Amer: >60 mL/min (ref >60)
GLUCOSE: 113 mg/dL — AB (ref 70–99)
POTASSIUM: 4.3 mmol/L (ref 3.5–5.1)
Sodium: 138 mmol/L (ref 135–145)
TOTAL PROTEIN: 6.1 g/dL — AB (ref 6.5–8.1)
Total Bilirubin: 1.1 mg/dL (ref 0.3–1.2)

## 2018-01-30 MED ORDER — METOPROLOL TARTRATE 50 MG PO TABS
50.0000 mg | ORAL_TABLET | Freq: Two times a day (BID) | ORAL | Status: DC
  Administered 2018-01-30 (×2): 50 mg via ORAL
  Filled 2018-01-30 (×3): qty 1

## 2018-01-30 MED ORDER — LISINOPRIL 20 MG PO TABS
20.0000 mg | ORAL_TABLET | Freq: Every day | ORAL | Status: DC
  Administered 2018-01-30: 20 mg via ORAL
  Filled 2018-01-30 (×2): qty 1

## 2018-01-30 MED ORDER — ROSUVASTATIN CALCIUM 40 MG PO TABS
40.0000 mg | ORAL_TABLET | Freq: Every day | ORAL | Status: DC
  Administered 2018-01-30 – 2018-02-11 (×13): 40 mg via ORAL
  Filled 2018-01-30 (×13): qty 1

## 2018-01-30 NOTE — Telephone Encounter (Signed)
sch appt spk to pt 02/09/18 115pm staple removal

## 2018-01-30 NOTE — Progress Notes (Signed)
Subjective/Complaints:  No issues overnite, slept better with Oxy CR  Review of systems denies chest pain, shortness of breath, nausea, Vomiting, diarrhea, constipation Objective: Vital Signs: Blood pressure (!) 145/93, pulse 89, temperature 98.9 F (37.2 C), temperature source Oral, resp. rate 18, height 5\' 8"  (1.727 m), weight 113.6 kg, SpO2 100 %. No results found. Results for orders placed or performed during the hospital encounter of 01/27/18 (from the past 72 hour(s))  CBC WITH DIFFERENTIAL     Status: Abnormal   Collection Time: 01/30/18  6:56 AM  Result Value Ref Range   WBC 8.5 4.0 - 10.5 K/uL   RBC 2.82 (L) 4.22 - 5.81 MIL/uL   Hemoglobin 7.8 (L) 13.0 - 17.0 g/dL   HCT 45.425.7 (L) 09.839.0 - 11.952.0 %   MCV 91.1 78.0 - 100.0 fL   MCH 27.7 26.0 - 34.0 pg   MCHC 30.4 30.0 - 36.0 g/dL   RDW 14.714.5 82.911.5 - 56.215.5 %   Platelets 278 150 - 400 K/uL   Neutrophils Relative % 72 %   Neutro Abs 6.1 1.7 - 7.7 K/uL   Lymphocytes Relative 16 %   Lymphs Abs 1.3 0.7 - 4.0 K/uL   Monocytes Relative 9 %   Monocytes Absolute 0.8 0.1 - 1.0 K/uL   Eosinophils Relative 2 %   Eosinophils Absolute 0.2 0.0 - 0.7 K/uL   Basophils Relative 0 %   Basophils Absolute 0.0 0.0 - 0.1 K/uL   Immature Granulocytes 1 %   Abs Immature Granulocytes 0.1 0.0 - 0.1 K/uL    Comment: Performed at [facility], [address] [place], [place] [address]     HEENT: normal Cardio: RRR and No murmur Resp: CTA B/L GI: BS positive and Nontender nondistended Extremity:  No Edema Skin:   Wound Bilateral medial calf incisions with staples in place mild serosanguineous drainage right dorsalis pedis incision clean dry and intact, left groin incision inguinal with Dermabond clean dry and intact Neuro: Alert/Oriented, Abnormal Sensory Reduced sensation at the toes but able to distinguish light touch and Abnormal Motor 5/5 bilateral deltoid, bicep, tricep, grip, 3- at the hip flexors knee extensors 4- ankle plantar flexor  dorsiflexor on the left and 3- on the right Musc/Skel:  Normal General no acute distress   Assessment/Plan: 1. Functional deficits secondary to peripheral vascular disease status post right popliteal and tibial embolectomy exploration of the right anterior tibial artery, left femoral to left below-knee popliteal bypass and left popliteal and tibial endarterectomy 01/24/2018 complicated by left popliteal hematoma which require 3+ hours per day of interdisciplinary therapy in a comprehensive inpatient rehab setting. Physiatrist is providing close team supervision and 24 hour management of active medical problems listed below. Physiatrist and rehab team continue to assess barriers to discharge/monitor patient progress toward functional and medical goals. FIM: Function - Bathing Position: Bed Body parts bathed by patient: Right arm, Left arm, Chest, Abdomen, Front perineal area, Buttocks, Right upper leg, Left upper leg Body parts bathed by helper: Right lower leg, Left lower leg, Back Assist Level: Touching or steadying assistance(Pt > 75%)  Function- Upper Body Dressing/Undressing What is the patient wearing?: Pull over shirt/dress Pull over shirt/dress - Perfomed by patient: Thread/unthread right sleeve, Thread/unthread left sleeve, Put head through opening, Pull shirt over trunk Assist Level: Set up Set up : To obtain clothing/put away Function - Lower Body Dressing/Undressing What is the patient wearing?: Underwear, Pants, Non-skid slipper socks Position: Sitting EOB Underwear - Performed by patient: Thread/unthread left  underwear leg, Pull underwear up/down Underwear - Performed by helper: Thread/unthread right underwear leg Pants- Performed by patient: Thread/unthread left pants leg, Pull pants up/down Pants- Performed by helper: Thread/unthread right pants leg Non-skid slipper socks- Performed by helper: Don/doff right sock, Don/doff left sock Assist for footwear: Maximal  assist Assist for lower body dressing: (Mod assist)  Function - Toileting Toileting steps completed by patient: Adjust clothing prior to toileting, Performs perineal hygiene, Adjust clothing after toileting Toileting Assistive Devices: Grab bar or rail Assist level: More than reasonable time  Function - Archivist transfer activity did not occur: Refused Toilet transfer assistive device: Elevated toilet seat/BSC over toilet, Grab bar, Cane Assist level to toilet: Supervision or verbal cues Assist level from toilet: Supervision or verbal cues  Function - Chair/bed transfer Chair/bed transfer method: Stand pivot, Ambulatory Chair/bed transfer assist level: Touching or steadying assistance (Pt > 75%) Chair/bed transfer assistive device: Environmental consultant, Armrests  Function - Locomotion: Wheelchair Will patient use wheelchair at discharge?: No Wheelchair activity did not occur: Safety/medical concerns Wheel 50 feet with 2 turns activity did not occur: Safety/medical concerns Wheel 150 feet activity did not occur: Safety/medical concerns Function - Locomotion: Ambulation Assistive device: Walker-rolling Max distance: 10 ft Assist level: Touching or steadying assistance (Pt > 75%) Assist level: Touching or steadying assistance (Pt > 75%) Walk 50 feet with 2 turns activity did not occur: Safety/medical concerns Walk 150 feet activity did not occur: Safety/medical concerns Walk 10 feet on uneven surfaces activity did not occur: Safety/medical concerns  Function - Comprehension Comprehension: Auditory Comprehension assist level: Follows basic conversation/direction with no assist  Function - Expression Expression: Verbal Expression assist level: Expresses basic needs/ideas: With no assist  Function - Social Interaction Social Interaction assist level: Interacts appropriately 90% of the time - Needs monitoring or encouragement for participation or interaction.  Function - Problem  Solving Problem solving assist level: Solves basic 90% of the time/requires cueing < 10% of the time  Function - Memory Memory assist level: More than reasonable amount of time Patient normally able to recall (first 3 days only): Current season, Location of own room, Staff names and faces, That he or she is in a hospital  Medical Problem List and Plan: 1.Debilitysecondary to peripheral vascular disease status post right popliteal and tibial embolectomy exploration right anterior tibial artery left femoral to below-knee popliteal bypass and left popliteal and tibial enterectomy 01/24/2018 complicated by hematoma with evacuation.Pt also with peripheral neuropathy -CIR PT, OT  2. DVT Prophylaxis/Anticoagulation/pulmonary emboli:Xarelto, monitor for blood loss given history of hematoma left popliteal space,  3. Pain Management:Oxycodone as needed, may need to schedule with therapies 4. Mood:Provide emotional support 5. Neuropsych: This patientiscapable of making decisions on hisown behalf. 6. Skin/Wound Care:Routine skin checks -continue local wound care to op-sites/lower extremities 7. Fluids/Electrolytes/Nutrition:Routine in and outs with follow-up chemistries 8.Acute blood loss anemia. Follow-up CBC on 01/30/2018- Hgb stabe at 7.8 9.Renal insufficiency. Baseline creatinine 1.27-1.55 10.Hypertension. Monitor with increased mobility. Patient on lisinopril 20 mg daily, Lopressor 50 mg twice daily prior to admission.  - Vitals:   01/29/18 2032 01/30/18 0404  BP: (!) 141/80 (!) 145/93  Pulse: 93 89  Resp: 18 18  Temp: 98 F (36.7 C) 98.9 F (37.2 C)  SpO2: 100% 100%  BP currently in a good range 8/19 11.CAD. No chest pain or shortness of breath 12.History of alcohol use. Provide counseling, no signs of withdrawal 13.Constipation. Laxative assistance, last BM 8/18 at 9 AM, colace 100mg  daily ,  Miralax prn  LOS (Days) 3 A  FACE TO FACE EVALUATION WAS PERFORMED  Erick Colacendrew E Wafaa Deemer 01/30/2018, 8:02 AM
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
## 2018-01-30 NOTE — Plan of Care (Signed)
  Problem: Consults Goal: RH GENERAL PATIENT EDUCATION Description See Patient Education module for education specifics. Outcome: Progressing Goal: Skin Care Protocol Initiated - if Braden Score 18 or less Description If consults are not indicated, leave blank or document N/A Outcome: Progressing   Problem: RH SKIN INTEGRITY Goal: RH STG SKIN FREE OF INFECTION/BREAKDOWN Description Patients skin will remain free from further infection or breakdown with mod I assist.  Outcome: Progressing Goal: RH STG MAINTAIN SKIN INTEGRITY WITH ASSISTANCE Description STG Maintain Skin Integrity With mod I Assistance.  Outcome: Progressing Goal: RH STG ABLE TO PERFORM INCISION/WOUND CARE W/ASSISTANCE Description STG Able To Perform Incision/Wound Care With mod Assistance.  Outcome: Progressing   Problem: RH SAFETY Goal: RH STG ADHERE TO SAFETY PRECAUTIONS W/ASSISTANCE/DEVICE Description STG Adhere to Safety Precautions With mod I  Assistance/Device.  Outcome: Progressing Goal: RH STG DECREASED RISK OF FALL WITH ASSISTANCE Description STG Decreased Risk of Fall With mod I Assistance.  Outcome: Progressing   Problem: RH PAIN MANAGEMENT Goal: RH STG PAIN MANAGED AT OR BELOW PT'S PAIN GOAL Description < 4  Outcome: Progressing   Problem: RH KNOWLEDGE DEFICIT GENERAL Goal: RH STG INCREASE KNOWLEDGE OF SELF CARE AFTER HOSPITALIZATION Description Pt will be able to direct care after discharge with Min assist  From family  Outcome: Progressing

## 2018-01-30 NOTE — Progress Notes (Signed)
Occupational Therapy Session Note  Patient Details  Name: Michael EhlersGary S Pickrel MRN: [id number] Date of Birth: 09/06/1960  Today's Date: 01/30/2018 OT Individual Time: [id number] OT Individual Time Calculation (min): 55 min    Short Term Goals: Week 1:  OT Short Term Goal 1 (Week 1): STG = LTGs due to ELOS  Skilled Therapeutic Interventions/Progress Updates:    Pt asleep in bed upon arrival and required mod verbal cues to arouse.  Pt declined shower this morning secondary to BLE pain from previous PT session.  Pt agreeable to getting OOB and "cleaning up" at sink.  Pt amb with RW approx 4' to sit in w/c.  Pt stated pain increased dramatically when standing and walking but able to complete walk to w/c with CGA. Pt completed bathing/dressing tasks with sit<>stand from w/c at sink.  Pt required assistance pulling up pants. Pt requires more than a reasonable amount of time to complete tasks.  Pt requested to return to bed and reapply ice pack to BLE for pain relief.  Pt remained in bed with all needs within reach and bed alarm activated.   Therapy Documentation Precautions:  Precautions Precautions: Fall Restrictions Weight Bearing Restrictions: No  Pain: Pain Assessment Pain Scale: 0-10 Pain Score: 4  Pain Type: Acute pain Pain Location: Leg Pain Orientation: Right Pain Descriptors / Indicators: Throbbing Pain Frequency: Intermittent Pain Onset: Sudden Patients Stated Pain Goal: 4 Pain Intervention(s): Medication (See eMAR)  See Function Navigator for Current Functional Status.   Therapy/Group: Individual Therapy  Rich BraveLanier, Tiffanye Hartmann [name] 01/30/2018, 10:32 AM

## 2018-01-30 NOTE — Progress Notes (Signed)
Pain much better with addition of Oxy CR. PRN Oxy Ir only required at 2206 and 0353. Decreased drainage to old JP drain site on LLE, no blood clots noted this shift. Incision to right knee oozing serosanguinous drainage, saturated two 4 x 4's in 12 hours. BLE edema right > left. Michael [name], Michael [name]

## 2018-01-30 NOTE — Care Management Note (Signed)
[facility] Individual Statement of Services  Patient Name:  Michael [name]  Date:  01/30/2018  Welcome to the [facility].  Our goal is to provide you with an individualized program based on your diagnosis and situation, designed to meet your specific needs.  With this comprehensive rehabilitation program, you will be expected to participate in at least 3 hours of rehabilitation therapies Monday-Friday, with modified therapy programming on the weekends.  Your rehabilitation program will include the following services:  Physical Therapy (PT), Occupational Therapy (OT), 24 hour per day rehabilitation nursing, Case Management (Social Worker), Rehabilitation Medicine, Nutrition Services and Pharmacy Services  Weekly team conferences will be held on Wednesday to discuss your progress.  Your Social Worker will talk with you frequently to get your input and to update you on team discussions.  Team conferences with you and your family in attendance may also be held.  Expected length of stay: 7-10 days  Overall anticipated outcome: Independent with device  Depending on your progress and recovery, your program may change. Your Social Worker will coordinate services and will keep you informed of any changes. Your Social Worker's name and contact numbers are listed  below.  The following services may also be recommended but are not provided by the [facility]:   Driving Evaluations  Home Health Rehabiltiation Services  Outpatient Rehabilitation Services    Arrangements will be made to provide these services after discharge if needed.  Arrangements include referral to agencies that provide these services.  Your insurance has been verified to be:  Medicare A & B Your primary doctor is:  Dr. Hyman HopesJegede  Pertinent information will be shared with your doctor and your insurance company.  Social Worker:  Dossie DerBecky Lorren Rossetti, SW [phone] or [phone])  [phone]  Information discussed with and copy given to patient by: Lucy Chrisupree, Anelle Parlow G, 01/30/2018, 9:14 AM

## 2018-01-30 NOTE — Progress Notes (Signed)
Physical Therapy Session Note  Patient Details  Name: Michael [name] MRN: [id number] Date of Birth: 12/11/1960  Today's Date: 01/30/2018 PT Individual Time: [id number] and 1500-1540 PT Individual Time Calculation (min): 75 min and 40 min PT Missed time: 20 minutes - fatigue, low BP, dizziness  Short Term Goals: Week 1:  PT Short Term Goal 1 (Week 1): STGs = LTGs  Skilled Therapeutic Interventions/Progress Updates:    Session 1: Pt supine in bed upon PT arrival, agreeable to therapy tx and reports pain 8/10 in B LEs. Pt requesting to go to the bathroom. Pt transferred to EOB with supervision. Pt performed sit<>stand with min assist and RW from bed. Pt ambulated x 10 ft from bed<>bathroom with CGA and increased time, decreased step length and wide BOS. Pt performed toileting and clothing management with supervision. Pt performed sit<>stand from toilet with min assist and ambulated to w/c. Pt propelled w/c from room>gym x 150 ft with B UEs and supervision. Pt performed LE exercises for strength and ROM including 2 x 5 of each: heel slides, LAQ, seated marches, ankle pumps. Therapist performed 2 x 30 heel cord stretch for ROM as pt has limited DF bilaterally. Pt limited by pain and fatigue, requiring increased therapeutic rest breaks throughout. Pt transported back to room and transferred back to bed with min assist. Transferred to supine with supervision, ice applied to B LEs for pain relief. Pt left supine with needs in reach.   Session 2: Pt supine in bed upon PT arrival, agreeable to therapy tx and reports pain 8/10 in B LEs. Pt requesting to go to the bathroom. Pt transferred to EOB with supervision. Pt performed sit<>stand with min assist and RW from bed. Pt performed stand pivot with RW and min assist, verbal cues for techniques, wide BOS. Pt propelled w/c from room>gym using B UEs and supervision x 150 ft. Pt performed forwards/backwards ambulation within parallel bars with min assist 2 x 8  ft, pt reports lightheadedness. BP monitored in sitting- 102/59. Pt performed sit<>stand within parallel bars min assist, pt symptomatic and BP 98/55, ace wraps donned. Pt transported back to room, RN made aware of pt's low blood pressure this session- pt started BP medication this morning that may be causing this. Pt performed stand pivot back to bed with min assist and transferred to supine with supervision. Pt left supine in bed with needs in reach and bed alarm set.   Therapy Documentation Precautions:  Precautions Precautions: Fall Restrictions Weight Bearing Restrictions: No   See Function Navigator for Current Functional Status.   Therapy/Group: Individual Therapy  Cresenciano GenreEmily van Schagen, PT, DPT 01/30/2018, 7:49 AM

## 2018-01-30 NOTE — Significant Event (Signed)
Patient started back on BP meds today.  Noted to feel dizzy during afternoon therapy session.  BP standing was 98/55 with legs compression wrapped.  Will notify provider.  Dani Gobbleeardon, Nardos Putnam J, RN

## 2018-01-30 NOTE — Progress Notes (Signed)
Changed dressings to BLE due to excessive weeping of serosanguinous fluids.  Placed ABD pad, kerlix, and compression wrapping to right lower extremity.  Left lower extremity not draining as much as the right leg, therefore used 4x4 over old JP drain site and wrapped with kerlix and compression wrapping.  Will continue to monitor incisions for drainage.  Dani Gobbleeardon, Akua Blethen J, RN

## 2018-01-31 ENCOUNTER — Inpatient Hospital Stay (HOSPITAL_COMMUNITY): Payer: Medicare Other

## 2018-01-31 ENCOUNTER — Encounter (HOSPITAL_COMMUNITY): Payer: Self-pay | Admitting: *Deleted

## 2018-01-31 ENCOUNTER — Inpatient Hospital Stay (HOSPITAL_COMMUNITY): Payer: Self-pay

## 2018-01-31 ENCOUNTER — Inpatient Hospital Stay (HOSPITAL_COMMUNITY): Payer: Self-pay | Admitting: Physical Therapy

## 2018-01-31 MED ORDER — DOCUSATE SODIUM 100 MG PO CAPS
100.0000 mg | ORAL_CAPSULE | Freq: Two times a day (BID) | ORAL | Status: DC
  Administered 2018-01-31 – 2018-02-11 (×23): 100 mg via ORAL
  Filled 2018-01-31 (×22): qty 1

## 2018-01-31 MED ORDER — METOPROLOL TARTRATE 25 MG PO TABS
25.0000 mg | ORAL_TABLET | Freq: Two times a day (BID) | ORAL | Status: DC
  Administered 2018-01-31 – 2018-02-11 (×21): 25 mg via ORAL
  Filled 2018-01-31 (×22): qty 1

## 2018-01-31 MED ORDER — LISINOPRIL 10 MG PO TABS
10.0000 mg | ORAL_TABLET | Freq: Every day | ORAL | Status: DC
  Administered 2018-01-31 – 2018-02-11 (×10): 10 mg via ORAL
  Filled 2018-01-31 (×11): qty 1

## 2018-01-31 NOTE — Progress Notes (Signed)
Occupational Therapy Session Note  Patient Details  Name: Michael EhlersGary S Filter MRN: [id number] Date of Birth: 06/08/1961  Today's Date: 01/31/2018 OT Individual Time: 0900-1000 OT Individual Time Calculation (min): 60 min    Short Term Goals: Week 1:  OT Short Term Goal 1 (Week 1): STG = LTGs due to ELOS  Skilled Therapeutic Interventions/Progress Updates:    Pt resting in bed upon arrival and agreeable to taking shower today.  Pt stated his pain was much better today. Pt sat EOB with supervision and amb with RW to bathroom with CGA.  Pt completed bathing tasks with sit<>stand from seat with steady A while standing.  Pt requires more than a reasonable amount of time to complete tasks.  Pt returned to w/c at sink and completed dressing tasks with sit<>stand from w/c.  Pt able to thread BLE into pants without AE and pull over hips.  Pt required assistance donning socks without AE. Pt remained in w/c with all needs within reach and NT present.   Therapy Documentation Precautions:  Precautions Precautions: Fall Restrictions Weight Bearing Restrictions: No Pain: Pain Assessment Pain Scale: 0-10 Pain Score: 4  Pain Location: Leg Pain Orientation: Left Pain Descriptors / Indicators: Aching Patients Stated Pain Goal: 3 Pain Intervention(s): Medication (See eMAR)  See Function Navigator for Current Functional Status.   Therapy/Group: Individual Therapy  Rich BraveLanier, Jerita Wimbush [name] 01/31/2018, 12:12 PM

## 2018-01-31 NOTE — Progress Notes (Signed)
Subjective/Complaints:  Sleeping ok, constipation but passing more gas  Review of systems denies chest pain, shortness of breath, nausea, Vomiting, diarrhea, constipation Objective: Vital Signs: Blood pressure 123/67, pulse 66, temperature 98.2 F (36.8 C), temperature source Oral, resp. rate 20, height 5' 8" (1.727 m), weight 113.6 kg, SpO2 100 %. No results found. Results for orders placed or performed during the hospital encounter of 01/27/18 (from the past 72 hour(s))  CBC WITH DIFFERENTIAL     Status: Abnormal   Collection Time: 01/30/18  6:56 AM  Result Value Ref Range   WBC 8.5 4.0 - 10.5 K/uL   RBC 2.82 (L) 4.22 - 5.81 MIL/uL   Hemoglobin 7.8 (L) 13.0 - 17.0 g/dL   HCT 25.7 (L) 39.0 - 52.0 %   MCV 91.1 78.0 - 100.0 fL   MCH 27.7 26.0 - 34.0 pg   MCHC 30.4 30.0 - 36.0 g/dL   RDW 14.5 11.5 - 15.5 %   Platelets 278 150 - 400 K/uL   Neutrophils Relative % 72 %   Neutro Abs 6.1 1.7 - 7.7 K/uL   Lymphocytes Relative 16 %   Lymphs Abs 1.3 0.7 - 4.0 K/uL   Monocytes Relative 9 %   Monocytes Absolute 0.8 0.1 - 1.0 K/uL   Eosinophils Relative 2 %   Eosinophils Absolute 0.2 0.0 - 0.7 K/uL   Basophils Relative 0 %   Basophils Absolute 0.0 0.0 - 0.1 K/uL   Immature Granulocytes 1 %   Abs Immature Granulocytes 0.1 0.0 - 0.1 K/uL    Comment: Performed at [facility], [address], [place], [place] [address]  Comprehensive metabolic panel     Status: Abnormal   Collection Time: 01/30/18  6:56 AM  Result Value Ref Range   Sodium 138 135 - 145 mmol/L   Potassium 4.3 3.5 - 5.1 mmol/L   Chloride 100 98 - 111 mmol/L   CO2 32 22 - 32 mmol/L   Glucose, Bld 113 (H) 70 - 99 mg/dL   BUN 13 6 - 20 mg/dL   Creatinine, Ser 1.15 0.61 - 1.24 mg/dL   Calcium 9.2 8.9 - 10.3 mg/dL   Total Protein 6.1 (L) 6.5 - 8.1 g/dL   Albumin 3.0 (L) 3.5 - 5.0 g/dL   AST 60 (H) 15 - 41 U/L   ALT 100 (H) 0 - 44 U/L   Alkaline Phosphatase 105 38 - 126 U/L   Total Bilirubin 1.1 0.3 - 1.2 mg/dL    GFR calc non Af Amer >60 >60 mL/min   GFR calc Af Amer >60 >60 mL/min    Comment: (NOTE) The eGFR has been calculated using the CKD EPI equation. This calculation has not been validated in all clinical situations. eGFR's persistently <60 mL/min signify possible Chronic Kidney Disease.    Anion gap 6 5 - 15    Comment: Performed at [facility] [address], [place], [place] [address]     HEENT: normal Cardio: RRR and No murmur Resp: CTA B/L GI: BS positive and Nontender nondistended Extremity:  No Edema Skin:   Wound Bilateral medial calf incisions with staples in place mild serosanguineous drainage right dorsalis pedis incision clean dry and intact, left groin incision inguinal with Dermabond clean dry and intact Neuro: Alert/Oriented, Abnormal Sensory Reduced sensation at the toes but able to distinguish light touch and Abnormal Motor 5/5 bilateral deltoid, bicep, tricep, grip, 3- at the hip flexors knee extensors 4- ankle plantar flexor dorsiflexor on the  left and 3- on the right Musc/Skel:  Normal General no acute distress   Assessment/Plan: 1. Functional deficits secondary to peripheral vascular disease status post right popliteal and tibial embolectomy exploration of the right anterior tibial artery, left femoral to left below-knee popliteal bypass and left popliteal and tibial endarterectomy 4/00/8676 complicated by left popliteal hematoma which require 3+ hours per day of interdisciplinary therapy in a comprehensive inpatient rehab setting. Physiatrist is providing close team supervision and 24 hour management of active medical problems listed below. Physiatrist and rehab team continue to assess barriers to discharge/monitor patient progress toward functional and medical goals. FIM: Function - Bathing Position: Wheelchair/chair at sink Body parts bathed by patient: Right arm, Left arm, Chest, Abdomen, Front perineal area, Buttocks, Right upper leg, Left upper leg, Right  lower leg, Left lower leg, Back Body parts bathed by helper: Right lower leg, Left lower leg, Back Assist Level: Touching or steadying assistance(Pt > 75%)  Function- Upper Body Dressing/Undressing What is the patient wearing?: Pull over shirt/dress Pull over shirt/dress - Perfomed by patient: Thread/unthread right sleeve, Thread/unthread left sleeve, Put head through opening, Pull shirt over trunk Assist Level: Set up Set up : To obtain clothing/put away Function - Lower Body Dressing/Undressing What is the patient wearing?: Underwear, Pants, Non-skid slipper socks Position: Wheelchair/chair at sink Underwear - Performed by patient: Thread/unthread right underwear leg, Thread/unthread left underwear leg Underwear - Performed by helper: Thread/unthread right underwear leg Pants- Performed by patient: Thread/unthread left pants leg, Pull pants up/down Pants- Performed by helper: Thread/unthread right pants leg Non-skid slipper socks- Performed by helper: Don/doff right sock, Don/doff left sock Assist for footwear: Maximal assist Assist for lower body dressing: Touching or steadying assistance (Pt > 75%)  Function - Toileting Toileting steps completed by patient: Adjust clothing prior to toileting, Performs perineal hygiene, Adjust clothing after toileting Toileting steps completed by helper: Adjust clothing prior to toileting, Performs perineal hygiene, Adjust clothing after toileting(per Haskell Flirt Aquit, NT report) Toileting Assistive Devices: Grab bar or rail Assist level: More than reasonable time  Function Midwife transfer activity did not occur: Risk analyst transfer assistive device: Elevated toilet seat/BSC over toilet, Grab bar, Cane Assist level to toilet: Supervision or verbal cues Assist level from toilet: Supervision or verbal cues  Function - Chair/bed transfer Chair/bed transfer method: Stand pivot, Ambulatory Chair/bed transfer assist level:  Touching or steadying assistance (Pt > 75%) Chair/bed transfer assistive device: Environmental consultant, Armrests  Function - Locomotion: Wheelchair Will patient use wheelchair at discharge?: No Wheelchair activity did not occur: Safety/medical concerns Wheel 50 feet with 2 turns activity did not occur: Safety/medical concerns Wheel 150 feet activity did not occur: Safety/medical concerns Function - Locomotion: Ambulation Assistive device: Walker-rolling Max distance: 10 ft Assist level: Touching or steadying assistance (Pt > 75%) Assist level: Touching or steadying assistance (Pt > 75%) Walk 50 feet with 2 turns activity did not occur: Safety/medical concerns Walk 150 feet activity did not occur: Safety/medical concerns Walk 10 feet on uneven surfaces activity did not occur: Safety/medical concerns  Function - Comprehension Comprehension: Auditory Comprehension assist level: Follows basic conversation/direction with no assist  Function - Expression Expression: Verbal Expression assist level: Expresses basic needs/ideas: With no assist  Function - Social Interaction Social Interaction assist level: Interacts appropriately 90% of the time - Needs monitoring or encouragement for participation or interaction.  Function - Problem Solving Problem solving assist level: Solves basic 90% of the time/requires cueing < 10% of the time  Function -  Memory Memory assist level: More than reasonable amount of time Patient normally able to recall (first 3 days only): Current season, Location of own room, Staff names and faces, That he or she is in a hospital  Medical Problem List and Plan: 1.Debilitysecondary to peripheral vascular disease status post right popliteal and tibial embolectomy exploration right anterior tibial artery left femoral to below-knee popliteal bypass and left popliteal and tibial enterectomy 8/91/6945 complicated by hematoma with evacuation.Pt also with peripheral  neuropathy -CIR PT, OT  2. DVT Prophylaxis/Anticoagulation/pulmonary emboli:Xarelto, monitor for blood loss given history of hematoma left popliteal space,  3. Pain Management:Oxycodone as needed, may need to schedule with therapies 4. Mood:Provide emotional support 5. Neuropsych: This patientiscapable of making decisions on hisown behalf. 6. Skin/Wound Care:Routine skin checks -continue local wound care to op-sites/lower extremities 7. Fluids/Electrolytes/Nutrition:Routine in and outs with follow-up chemistries 8.Acute blood loss anemia. Follow-up CBC on 01/30/2018- Hgb stabe at 7.8 9.Renal insufficiency. Baseline creatinine 1.27-1.55 10.Hypertension. Monitor with increased mobility. Patient on lisinopril 20 mg daily, Lopressor 50 mg twice daily prior to admission.  - Vitals:   01/30/18 1956 01/31/18 0436  BP: 105/67 123/67  Pulse: 71 66  Resp: 20 20  Temp: 98.5 F (36.9 C) 98.2 F (36.8 C)  SpO2: 99% 100%  BP currently in a good range 8/20 but had orthostatic hypotension yesterday reduce dose 11.CAD. No chest pain or shortness of breath 12.History of alcohol use. Provide counseling, no signs of withdrawal           13.Constipation. Laxative assistance, last BM 8/18 at 9 AM, colace 130m daily increase to BID , Miralax prn 13, Hypoalb prostat LOS (Days) 4 A FACE TO FACE EVALUATION WAS PERFORMED  ACharlett Blake8/20/2019, 7:36 AM

## 2018-01-31 NOTE — Progress Notes (Addendum)
Physical Therapy Session Note  Patient Details  Name: Michael [name] MRN: [id number] Date of Birth: 02/18/1961  Today's Date: 01/31/2018 PT Individual Time: 1100-1155 AND 6088-8358 PT Individual Time Calculation (min): 55 min AND 30 min  Short Term Goals: Week 1:  PT Short Term Goal 1 (Week 1): STGs = LTGs  Skilled Therapeutic Interventions/Progress Updates:   Session 1: Pt in supine and agreeable to therapy, pain 5/10 in legs and groin. Transferred to EOB w/ supervision and voided in urinal w/ set-up assist. Transferred to w/c via stand pivot w/ RW and increased time, min guard. Session focused on overall endurance, LE strengthening, and LE ROM within pain tolerance. Pt self-propelled w/c to/from day room w/ supervision to work on UE strengthening and endurance. Performed NuStep 10 min in total, however took almost 15 min to complete 2/2 frequent rest breaks 2/2 leg discomfort and pain. Encouraged to keep movements gentle and within pain tolerable range, pain gradually improved while performing task. Emphasis on maintaining neutral LE positioning and gradually increasing tolerable LE ROM. Returned to room and ended session in w/c, call bell within reach and all needs met.   Session 2:  Pt in w/c and agreeable to make-up session for missed time w/ other scheduled therapies today, pain 5/10 and tolerable. Worked on overall endurance and strengthening this session. Pt self-propelled w/c to/from therapy gym w/ BUEs and supervision, frequent 3-5 sec rest breaks 2/2 fatigue. Performed BLE strengthening exercises including LAQs 3x10 and heel slides 3x10. All exercises performed within pain tolerable range, pain began to increase towards last set. Returned to room and transferred to EOB w/ min guard and to supine. Ended session in supine, call bell within reach and all needs met. Made RN aware of pain level, 10/10 at end of session.   Therapy Documentation Precautions:  Precautions Precautions:  Fall Restrictions Weight Bearing Restrictions: No Pain: Pain Assessment Pain Scale: 0-10 Pain Score: 4  Pain Location: Leg Pain Orientation: Left Pain Descriptors / Indicators: Aching Patients Stated Pain Goal: 3 Pain Intervention(s): Medication (See eMAR)  See Function Navigator for Current Functional Status.   Therapy/Group: Individual Therapy  Michael [name] 01/31/2018, 11:55 AM

## 2018-01-31 NOTE — Progress Notes (Signed)
Occupational Therapy Note  Patient Details  Name: Michael [name] MRN: [id number] Date of Birth: 12/13/1960  Today's Date: 01/31/2018 OT Missed Time: 30 Minutes Missed Time Reason: Patient fatigue;Pain;Other (comment)(low BP)  Pt missed 30 mins skilled OT services.  Pt with 9/10 pain in BLE (ice applied and RN aware) and fatigue.  Pt unable to participate.    Lavone NeriLanier, Eustacia Urbanek [facility] Hospital[facility]appell 01/31/2018, 2:28 PM

## 2018-01-31 NOTE — Progress Notes (Signed)
Physical Therapy Session Note  Patient Details  Name: Michael [name] MRN: [id number] Date of Birth: 02/21/1961  Today's Date: 01/31/2018 PT Individual Time: 1300-1345 PT Individual Time Calculation (min): 45 min  and Today's Date: 01/31/2018 PT Missed Time: 15 Minutes Missed Time Reason: Pain;Other (Comment)(low BP)  Short Term Goals: Week 1:  PT Short Term Goal 1 (Week 1): STGs = LTGs  Skilled Therapeutic Interventions/Progress Updates:    Pt supine in bed upon PT arrival, agreeable to therapy tx and reports pain 10/10 in B LEs. Pt transferred to seated EOB with supervision and performed stand pivot to w/c with min assist RW and increased time. Pt transported to the gym. Attempted to perform stair training this session on 3 inch steps, first attempt pt unable to lift LE onto step. Pt also reports dizziness with BP 101/56. Pt attempted again to ascend 3 inch step using B handrails, pt able to lift L LE onto step with assist from therapist but unable to bring R LE up. BP now 97/62. Pt transported back to room, stand pivot to bed with min assist. Pt transferred to supine with supervision. Therapist applied ice to B LEs and elevated legs for pain and edema control. Pt left supine with needs in reach.  Pt missed 15 minutes of skilled therapy tx secondary to 10/10 pain and low BP.    Therapy Documentation Precautions:  Precautions Precautions: Fall Restrictions Weight Bearing Restrictions: No   See Function Navigator for Current Functional Status.   Therapy/Group: Individual Therapy  Cresenciano GenreEmily van Schagen, PT, DPT 01/31/2018, 7:48 AM

## 2018-02-01 ENCOUNTER — Inpatient Hospital Stay (HOSPITAL_COMMUNITY): Payer: Self-pay

## 2018-02-01 ENCOUNTER — Inpatient Hospital Stay (HOSPITAL_COMMUNITY): Payer: Medicare Other

## 2018-02-01 MED ORDER — OXYCODONE HCL ER 15 MG PO T12A
15.0000 mg | EXTENDED_RELEASE_TABLET | Freq: Two times a day (BID) | ORAL | Status: DC
  Administered 2018-02-01 – 2018-02-10 (×18): 15 mg via ORAL
  Filled 2018-02-01 (×18): qty 1

## 2018-02-01 NOTE — Plan of Care (Signed)
  Problem: Consults Goal: RH GENERAL PATIENT EDUCATION Description See Patient Education module for education specifics. Outcome: Progressing Goal: Skin Care Protocol Initiated - if Braden Score 18 or less Description If consults are not indicated, leave blank or document N/A Outcome: Progressing   Problem: RH SKIN INTEGRITY Goal: RH STG SKIN FREE OF INFECTION/BREAKDOWN Description Patients skin will remain free from further infection or breakdown with mod I assist.  Outcome: Progressing Goal: RH STG MAINTAIN SKIN INTEGRITY WITH ASSISTANCE Description STG Maintain Skin Integrity With mod I Assistance.  Outcome: Progressing Goal: RH STG ABLE TO PERFORM INCISION/WOUND CARE W/ASSISTANCE Description STG Able To Perform Incision/Wound Care With mod Assistance.  Outcome: Progressing   Problem: RH SAFETY Goal: RH STG ADHERE TO SAFETY PRECAUTIONS W/ASSISTANCE/DEVICE Description STG Adhere to Safety Precautions With mod I  Assistance/Device.  Outcome: Progressing Goal: RH STG DECREASED RISK OF FALL WITH ASSISTANCE Description STG Decreased Risk of Fall With mod I Assistance.  Outcome: Progressing   Problem: RH PAIN MANAGEMENT Goal: RH STG PAIN MANAGED AT OR BELOW PT'S PAIN GOAL Description < 4  With min A  Outcome:  not progressing   Problem: RH KNOWLEDGE DEFICIT GENERAL Goal: RH STG INCREASE KNOWLEDGE OF SELF CARE AFTER HOSPITALIZATION Description Pt will be able to direct care after discharge with Min assist  From family  Outcome: Progressing   Problem: RH BOWEL ELIMINATION Goal: RH STG MANAGE BOWEL W/MEDICATION W/ASSISTANCE Description Pt will have bm q 2-3 days with min A   Outcome: Progressing    Pt pain continues with prn percocet in addition to MS contin bid. Pt had bm today, after miralax

## 2018-02-01 NOTE — Progress Notes (Signed)
Subjective/Complaints:  Feels like he needs a BM, no abd pain, per RN dressing change to LE calf wounds BID with serosang  Review of systems denies chest pain, shortness of breath, nausea, Vomiting, diarrhea, constipation Objective: Vital Signs: Blood pressure 130/73, pulse 68, temperature 98.6 F (37 C), temperature source Oral, resp. rate 20, height 5' 8"  (1.727 m), weight 113.6 kg, SpO2 100 %. No results found. Results for orders placed or performed during the hospital encounter of 01/27/18 (from the past 72 hour(s))  CBC WITH DIFFERENTIAL     Status: Abnormal   Collection Time: 01/30/18  6:56 AM  Result Value Ref Range   WBC 8.5 4.0 - 10.5 K/uL   RBC 2.82 (L) 4.22 - 5.81 MIL/uL   Hemoglobin 7.8 (L) 13.0 - 17.0 g/dL   HCT 25.7 (L) 39.0 - 52.0 %   MCV 91.1 78.0 - 100.0 fL   MCH 27.7 26.0 - 34.0 pg   MCHC 30.4 30.0 - 36.0 g/dL   RDW 14.5 11.5 - 15.5 %   Platelets 278 150 - 400 K/uL   Neutrophils Relative % 72 %   Neutro Abs 6.1 1.7 - 7.7 K/uL   Lymphocytes Relative 16 %   Lymphs Abs 1.3 0.7 - 4.0 K/uL   Monocytes Relative 9 %   Monocytes Absolute 0.8 0.1 - 1.0 K/uL   Eosinophils Relative 2 %   Eosinophils Absolute 0.2 0.0 - 0.7 K/uL   Basophils Relative 0 %   Basophils Absolute 0.0 0.0 - 0.1 K/uL   Immature Granulocytes 1 %   Abs Immature Granulocytes 0.1 0.0 - 0.1 K/uL    Comment: Performed at [facility], [address], [place], [place] [address]  Comprehensive metabolic panel     Status: Abnormal   Collection Time: 01/30/18  6:56 AM  Result Value Ref Range   Sodium 138 135 - 145 mmol/L   Potassium 4.3 3.5 - 5.1 mmol/L   Chloride 100 98 - 111 mmol/L   CO2 32 22 - 32 mmol/L   Glucose, Bld 113 (H) 70 - 99 mg/dL   BUN 13 6 - 20 mg/dL   Creatinine, Ser 1.15 0.61 - 1.24 mg/dL   Calcium 9.2 8.9 - 10.3 mg/dL   Total Protein 6.1 (L) 6.5 - 8.1 g/dL   Albumin 3.0 (L) 3.5 - 5.0 g/dL   AST 60 (H) 15 - 41 U/L   ALT 100 (H) 0 - 44 U/L   Alkaline Phosphatase 105 38 -  126 U/L   Total Bilirubin 1.1 0.3 - 1.2 mg/dL   GFR calc non Af Amer >60 >60 mL/min   GFR calc Af Amer >60 >60 mL/min    Comment: (NOTE) The eGFR has been calculated using the CKD EPI equation. This calculation has not been validated in all clinical situations. eGFR's persistently <60 mL/min signify possible Chronic Kidney Disease.    Anion gap 6 5 - 15    Comment: Performed at [facility] [address], [place], [place] [address]     HEENT: normal Cardio: RRR and No murmur Resp: CTA B/L GI: BS positive and Nontender nondistended Extremity:  No Edema Skin:   Wound Bilateral medial calf incisions with staples in place mild serosanguineous drainage right dorsalis pedis incision clean dry and intact, left groin incision inguinal with Dermabond clean dry and intact Neuro: Alert/Oriented, Abnormal Sensory Reduced sensation at the toes but able to distinguish light touch and Abnormal Motor 5/5 bilateral deltoid, bicep, tricep, grip, 3-  at the hip flexors knee extensors 4- ankle plantar flexor dorsiflexor on the left and 3- on the right Musc/Skel:  Normal General no acute distress   Assessment/Plan: 1. Functional deficits secondary to peripheral vascular disease status post right popliteal and tibial embolectomy exploration of the right anterior tibial artery, left femoral to left below-knee popliteal bypass and left popliteal and tibial endarterectomy 07/08/5807 complicated by left popliteal hematoma which require 3+ hours per day of interdisciplinary therapy in a comprehensive inpatient rehab setting. Physiatrist is providing close team supervision and 24 hour management of active medical problems listed below. Physiatrist and rehab team continue to assess barriers to discharge/monitor patient progress toward functional and medical goals. FIM: Function - Bathing Position: Shower Body parts bathed by patient: Right arm, Left arm, Chest, Abdomen, Front perineal area, Buttocks, Right  upper leg, Left upper leg, Right lower leg, Left lower leg, Back Body parts bathed by helper: Right lower leg, Left lower leg, Back Assist Level: Touching or steadying assistance(Pt > 75%)  Function- Upper Body Dressing/Undressing What is the patient wearing?: Pull over shirt/dress Pull over shirt/dress - Perfomed by patient: Thread/unthread right sleeve, Thread/unthread left sleeve, Put head through opening, Pull shirt over trunk Assist Level: Set up Set up : To obtain clothing/put away Function - Lower Body Dressing/Undressing What is the patient wearing?: Pants, Non-skid slipper socks Position: Wheelchair/chair at sink Underwear - Performed by patient: Thread/unthread right underwear leg, Thread/unthread left underwear leg Underwear - Performed by helper: Thread/unthread right underwear leg, Thread/unthread left underwear leg, Pull underwear up/down Pants- Performed by patient: Thread/unthread left pants leg, Pull pants up/down Pants- Performed by helper: Thread/unthread right pants leg Non-skid slipper socks- Performed by helper: Don/doff right sock, Don/doff left sock Assist for footwear: Maximal assist Assist for lower body dressing: Touching or steadying assistance (Pt > 75%)  Function - Toileting Toileting steps completed by patient: Adjust clothing prior to toileting, Performs perineal hygiene, Adjust clothing after toileting Toileting steps completed by helper: Adjust clothing prior to toileting, Performs perineal hygiene, Adjust clothing after toileting(per Haskell Flirt Aquit, NT report) Toileting Assistive Devices: Grab bar or rail Assist level: More than reasonable time  Function Midwife transfer activity did not occur: Risk analyst transfer assistive device: Elevated toilet seat/BSC over toilet, Grab bar, Cane Assist level to toilet: Supervision or verbal cues Assist level from toilet: Supervision or verbal cues  Function - Chair/bed transfer Chair/bed  transfer method: Stand pivot Chair/bed transfer assist level: Touching or steadying assistance (Pt > 75%) Chair/bed transfer assistive device: Environmental consultant, Armrests  Function - Locomotion: Wheelchair Will patient use wheelchair at discharge?: No Type: Manual Wheelchair activity did not occur: Safety/medical concerns Max wheelchair distance: 150' Assist Level: Supervision or verbal cues Wheel 50 feet with 2 turns activity did not occur: Safety/medical concerns Assist Level: Supervision or verbal cues Wheel 150 feet activity did not occur: Safety/medical concerns Assist Level: Supervision or verbal cues Function - Locomotion: Ambulation Assistive device: Walker-rolling Max distance: 10 ft Assist level: Touching or steadying assistance (Pt > 75%) Assist level: Touching or steadying assistance (Pt > 75%) Walk 50 feet with 2 turns activity did not occur: Safety/medical concerns Walk 150 feet activity did not occur: Safety/medical concerns Walk 10 feet on uneven surfaces activity did not occur: Safety/medical concerns  Function - Comprehension Comprehension: Auditory Comprehension assist level: Follows basic conversation/direction with no assist  Function - Expression Expression: Verbal Expression assist level: Expresses basic needs/ideas: With no assist  Function - Social Interaction Social Interaction  assist level: Interacts appropriately 90% of the time - Needs monitoring or encouragement for participation or interaction.  Function - Problem Solving Problem solving assist level: Solves basic 90% of the time/requires cueing < 10% of the time  Function - Memory Memory assist level: More than reasonable amount of time Patient normally able to recall (first 3 days only): Current season, Location of own room, Staff names and faces, That he or she is in a hospital  Medical Problem List and Plan: 1.Debilitysecondary to peripheral vascular disease status post right popliteal and tibial  embolectomy exploration right anterior tibial artery left femoral to below-knee popliteal bypass and left popliteal and tibial enterectomy 6/50/3546 complicated by hematoma with evacuation.Pt also with peripheral neuropathy -CIR PT, OT  Team conference today please see physician documentation under team conference tab, met with team face-to-face to discuss problems,progress, and goals. Formulized individual treatment plan based on medical history, underlying problem and comorbidities. 2. DVT Prophylaxis/Anticoagulation/pulmonary emboli:Xarelto, monitor for blood loss given history of hematoma left popliteal space, repeat CBC in am 3. Pain Management:Oxycodone as needed, may need to schedule with therapies 4. Mood:Provide emotional support 5. Neuropsych: This patientiscapable of making decisions on hisown behalf. 6. Skin/Wound Care:Routine skin checks -continue local wound care to op-sites/lower extremities 7. Fluids/Electrolytes/Nutrition:Routine in and outs with follow-up chemistries 8.Acute blood loss anemia. Follow-up CBC on 01/30/2018- Hgb stabe at 7.8 9.Renal insufficiency. Baseline creatinine 1.27-1.55 10.Hypertension. Monitor with increased mobility. Patient on lisinopril 20 mg daily, Lopressor 50 mg twice daily prior to admission.  - Vitals:   01/31/18 1955 02/01/18 0430  BP: 114/75 130/73  Pulse: 69 68  Resp: 18 20  Temp: 98 F (36.7 C) 98.6 F (37 C)  SpO2: 100% 100%  BP currently in a good range 8/21 11.CAD. No chest pain or shortness of breath 12.History of alcohol use. Provide counseling, no signs of withdrawal           13.Constipation. Laxative assistance, last BM 8/18 at 9 AM, colace 162m daily increase to BID , Miralax prn 13, Hypoalb prostat LOS (Days) 5 A FACE TO FACE EVALUATION WAS PERFORMED  ACharlett Blake8/21/2019, 7:56 AM

## 2018-02-01 NOTE — Progress Notes (Signed)
Pt was advised that he is reaching his limit with the tylenol for the day. Pt stated his doctor approved him taking oxycodone and tylenol together. Nurse explained the risk for taking tylenol past its limit and stated that it could effect certain organs if not taken serious. Pt thanked nurse for the education but stated he did not want to switch how he was treating his pain.

## 2018-02-01 NOTE — Progress Notes (Signed)
Social Work Patient ID: Michael [name], male   DOB: 1961/03/15, 56 y.o.   MRN: [id number]  Met with pt and sister was in the room to discuss team conference goals mod/i level and target discharge date 8/29. He feels this will allow him enough time to make good progress and get stronger. He feels he is doing well thus far and likes the CIR program. Will work on discharge needs.

## 2018-02-01 NOTE — Progress Notes (Signed)
Physical Therapy Session Note  Patient Details  Name: Michael [name] MRN: [id number] Date of Birth: 05/09/1961  Today's Date: 02/01/2018 PT Individual Time: 1000-1055 and [id number] PT Individual Time Calculation (min): 55 min and 60 min Missed time: 15 minutes (pain)  Short Term Goals: Week 1:  PT Short Term Goal 1 (Week 1): STGs = LTGs  Skilled Therapeutic Interventions/Progress Updates:    Session 1: Pt supine in bed upon PT arrival, agreeable to therapy tx and reports pain 4/10 in B LEs. Pt transferred to seated EOB with supervision and performed stand pivot to w/c with min assist RW and increased time. Pt propelled w/c from room>gym x 150 ft with supervision and B UEs. Pt ascended/descended 2 steps (3inch) with B handrails and min assist, step to pattern with very heavy reliance on UEs, wide BOS. Pt ascended/descended 1 step (6inch) with B handrails and min assist, step to pattern, increased time and heavy reliance on UEs. Pt transported back to room, stand pivot back to bed with min assist. Pt now with 10/10 pain in B LEs. Pt transferred to supine with supervision and therapist applied ice to B LEs for pain relief, also called RN for pain medication.   Session 2: Pt supine in bed upon PT arrival, agreeable to therapy tx and reports pain 10/10 in B LEs. Pt transferred to seated EOB with supervision and performed stand pivot to w/c with min assist RW and increased time. Pt propelled w/c from room>dayroom x 100 ft with supervision and B UEs. Pt performed squat pivot transfer from w/c to nustep with supervision. Pt used nustep x 12 minutes on workload 3, working on knee ROM. Pt performed quat pivot to w/c with supervision. Pt propelled w/c to the gym x 100 ft with supervision and B UEs. Pt worked on LE strength kicking ball back and forth with therapist x 2 trials. Pt propelled w/c back to room x 200 ft with supervision. Pt performed stand pivot to bed with supervision. Pt transferred to supine  with supervision. Pt missed 15 minutes of skilled therapy tx this session secondary to 10/10 pain. Left with needs in reach and bed alarm set.    Therapy Documentation Precautions:  Precautions Precautions: Fall Restrictions Weight Bearing Restrictions: No   See Function Navigator for Current Functional Status.   Therapy/Group: Individual Therapy  Cresenciano GenreEmily van Schagen, PT, DPT 02/01/2018, 7:55 AM

## 2018-02-01 NOTE — Plan of Care (Signed)
  Problem: Consults Goal: RH GENERAL PATIENT EDUCATION Description See Patient Education module for education specifics. Outcome: Progressing Goal: Skin Care Protocol Initiated - if Braden Score 18 or less Description If consults are not indicated, leave blank or document N/A Outcome: Progressing   Problem: RH SKIN INTEGRITY Goal: RH STG SKIN FREE OF INFECTION/BREAKDOWN Description Patients skin will remain free from further infection or breakdown with mod I assist.  Outcome: Progressing Goal: RH STG MAINTAIN SKIN INTEGRITY WITH ASSISTANCE Description STG Maintain Skin Integrity With mod I Assistance.  Outcome: Progressing Goal: RH STG ABLE TO PERFORM INCISION/WOUND CARE W/ASSISTANCE Description STG Able To Perform Incision/Wound Care With mod Assistance.  Outcome: Progressing   Problem: RH SAFETY Goal: RH STG ADHERE TO SAFETY PRECAUTIONS W/ASSISTANCE/DEVICE Description STG Adhere to Safety Precautions With mod I  Assistance/Device.  Outcome: Progressing Goal: RH STG DECREASED RISK OF FALL WITH ASSISTANCE Description STG Decreased Risk of Fall With mod I Assistance.  Outcome: Progressing   Problem: RH PAIN MANAGEMENT Goal: RH STG PAIN MANAGED AT OR BELOW PT'S PAIN GOAL Description < 4  With min A  Outcome: Progressing   Problem: RH KNOWLEDGE DEFICIT GENERAL Goal: RH STG INCREASE KNOWLEDGE OF SELF CARE AFTER HOSPITALIZATION Description Pt will be able to direct care after discharge with Min assist  From family  Outcome: Progressing   Problem: RH BOWEL ELIMINATION Goal: RH STG MANAGE BOWEL W/MEDICATION W/ASSISTANCE Description Pt will have bm q 2-3 days with min A   Outcome: Progressing

## 2018-02-01 NOTE — Progress Notes (Signed)
Occupational Therapy Session Note  Patient Details  Name: Michael [name] MRN: [id number] Date of Birth: 10/29/1960  Today's Date: 02/01/2018 OT Individual Time: 0900-1000 OT Individual Time Calculation (min): 60 min    Short Term Goals: Week 1:  OT Short Term Goal 1 (Week 1): STG = LTGs due to ELOS  Skilled Therapeutic Interventions/Progress Updates:    OT intervention with focus on bed mobility, functional amb with RW, sit<>stand, standing balance, BADL retraining, energy conservation strategies, discharge planning, activity tolerance, and safety awareness to increase independence with BADLs. Pt completed bathing/dressing tasks with sit<>stand from w/c at sink.  Pt declined shower this morning. Pt amb in room with RW from bed to w/c at sink with steady A.  Pt required steady A when standing to bathe buttocks and pull pants over hips. Demonstrated tub bench but did not practice transfer this morning secondary to time constraints.  Pt remained in w/c awaiting PT. All needs within reach.   Therapy Documentation Precautions:  Precautions Precautions: Fall Restrictions Weight Bearing Restrictions: No Pain: Pt c/o 3/10 pain in BLE; RN aware and repositioned See Function Navigator for Current Functional Status.   Therapy/Group: Individual Therapy  Michael [name], Michael [name] 02/01/2018, 3:22 PM

## 2018-02-01 NOTE — Patient Care Conference (Signed)
Inpatient RehabilitationTeam Conference and Plan of Care Update Date: 02/01/2018   Time: 11:10 AM    Patient Name: Michael [name]      Medical Record Number: [id number]  Date of Birth: 02/09/1961 Sex: Male         Room/Bed: [id number]/4W04C-01 Payor Info: Payor: MEDICARE / Plan: MEDICARE PART A AND B / Product Type: *No Product type* /    Admitting Diagnosis: Debility  Admit Date/Time:  01/27/2018  5:36 PM Admission Comments: No comment available   Primary Diagnosis:  <principal problem not specified> Principal Problem: <principal problem not specified>  Patient Active Problem List   Diagnosis Date Noted  . Debility   . Ischemia of extremity 01/23/2018  . Frequency of urination and polyuria 07/06/2017  . Erectile dysfunction due to diseases classified elsewhere 10/27/2016  . PAD (peripheral artery disease) (HCC) 08/11/2016  . Nocturia 08/11/2016  . Chronic pain of both knees 04/01/2016  . Palpitations 05/05/2015  . Hyperlipidemia 05/05/2015  . HNP (herniated nucleus pulposus), cervical 10/14/2014  . Chronic low back pain 10/14/2014  . Numbness and tingling of both upper extremities while sleeping 10/14/2014  . Frequent urination 09/03/2014  . Lower abdominal pain 09/03/2014  . Cervical stenosis of spine 05/16/2014  . Essential hypertension 05/16/2014  . Numbness and tingling of right arm 11/28/2013  . Dental abscess 04/26/2013  . Dysuria 04/26/2013  . HTN (hypertension) 04/04/2013  . CAD (coronary artery disease) 05/25/2011    Expected Discharge Date: Expected Discharge Date: 02/09/18  Team Members Present: Physician leading conference: Dr. Claudette LawsAndrew Kirsteins Social Worker Present: Dossie DerBecky Johnni Wunschel, LCSW Nurse Present: Ronny BaconWhitney Reardon, RN PT Present: Woodfin GanjaEmily Van Shagen, PT OT Present: Ardis Rowanom [name], COTA;Jennifer Katrinka BlazingSmith, OT SLP Present: Feliberto Gottronourtney Payne, SLP PPS Coordinator present : Tora DuckMarie Noel, RN, CRRN     Current Status/Progress Goal Weekly Team Focus  Medical   Patient with  pain interfering with therapy.  Still having drainage from wounds  Control pain, monitor wounds  Monitor hemoglobin   Bowel/Bladder   continent of b/b with min A, LBM 8/20  continent of b/b with mod I A  monitor b/b q shift and prn   Swallow/Nutrition/ Hydration             ADL's   bathing at shower level-steady A; LB dressing-min A; functional tranfsers-steady A; pain limitis activity; occasional low BP  independent with assistive device overall  activity tolerance, functional tranfsers, safety awareness, BADL retraining   Mobility   min guard to supervision transfers and short distance gait  mod I  LE strengthening and ROM, endurance and tolerance to standing/upright activity   Communication             Safety/Cognition/ Behavioral Observations            Pain   c/o pain to bilat LE frequently, current oxycontin 10mg  bid, percocet 2 tab q 4 hours prn, pt calls for med q 4 hours, c/o pain of 8, minimal relief  pain <2 with min A  monitor pain q shift and q 4 hours while pain remains acute.   Skin   incisions to L groin with skin glue, incisions to LLE and RLE with staples, LE edema decreasing, still 2+ Right and 1+ to left, RLE incisions with copious drainage, left with minimal drainage, dressing change with abd, kerlix and ace bandage q shift and prn. ace bandages off at HS  maintain skin with mod I A  continue to monitor skin q shift and prn, teach dressing  change to pt if he will be going home with dressing changes      *See Care Plan and progress notes for long and short-term goals.     Barriers to Discharge  Current Status/Progress Possible Resolutions Date Resolved   Physician    Medical stability;Other (comments)  Pain control  Progressing towards goals  Continue rehab, continue medication management      Nursing                  PT  Inaccessible home environment;Decreased caregiver support;Other (comments)(pain)                 OT                  SLP                 SW Decreased caregiver support Does not have 24 hr care at DC            Discharge Planning/Teaching Needs:  Home alone with family/friends checking in on him, needs to be mod/i to be able to go home safely      Team Discussion:  Making progress toward his goals of mod/i level. Endurance and activity tolerance issues. Having dressing changes BID. Limited by pain and fatigue which will take time. Knee ROM PT is working on. MD managin wounds and medications.  Revisions to Treatment Plan:  DC 8/29    Continued Need for Acute Rehabilitation Level of Care: The patient requires daily medical management by a physician with specialized training in physical medicine and rehabilitation for the following conditions: Daily direction of a multidisciplinary physical rehabilitation program to ensure safe treatment while eliciting the highest outcome that is of practical value to the patient.: Yes Daily medical management of patient stability for increased activity during participation in an intensive rehabilitation regime.: Yes Daily analysis of laboratory values and/or radiology reports with any subsequent need for medication adjustment of medical intervention for : Post surgical problems  Arien Morine, Lemar LivingsRebecca G 02/02/2018, 10:24 AM

## 2018-02-02 ENCOUNTER — Inpatient Hospital Stay (HOSPITAL_COMMUNITY): Payer: Medicare Other | Admitting: Physical Therapy

## 2018-02-02 ENCOUNTER — Inpatient Hospital Stay (HOSPITAL_COMMUNITY): Payer: Medicare Other

## 2018-02-02 LAB — CBC
HCT: 26.1 % — ABNORMAL LOW (ref 39.0–52.0)
HEMOGLOBIN: 7.9 g/dL — AB (ref 13.0–17.0)
MCH: 27.7 pg (ref 26.0–34.0)
MCHC: 30.3 g/dL (ref 30.0–36.0)
MCV: 91.6 fL (ref 78.0–100.0)
PLATELETS: 335 10*3/uL (ref 150–400)
RBC: 2.85 MIL/uL — ABNORMAL LOW (ref 4.22–5.81)
RDW: 14.8 % (ref 11.5–15.5)
WBC: 8 10*3/uL (ref 4.0–10.5)

## 2018-02-02 NOTE — Progress Notes (Signed)
Subjective/Complaints:  Per RN note taking excess tylenol yest, we increased the Oxy CR last noc  Review of systems denies chest pain, shortness of breath, nausea, Vomiting, diarrhea, constipation Objective: Vital Signs: Blood pressure (!) 104/45, pulse 72, temperature 98.3 F (36.8 C), temperature source Oral, resp. rate 18, height 5\' 8"  (1.727 m), weight 113.6 kg, SpO2 100 %. No results found. Results for orders placed or performed during the hospital encounter of 01/27/18 (from the past 72 hour(s))  CBC     Status: Abnormal   Collection Time: 02/02/18  4:57 AM  Result Value Ref Range   WBC 8.0 4.0 - 10.5 K/uL   RBC 2.85 (L) 4.22 - 5.81 MIL/uL   Hemoglobin 7.9 (L) 13.0 - 17.0 g/dL   HCT 09.8 (L) 11.9 - 14.7 %   MCV 91.6 78.0 - 100.0 fL   MCH 27.7 26.0 - 34.0 pg   MCHC 30.3 30.0 - 36.0 g/dL   RDW 82.9 56.2 - 13.0 %   Platelets 335 150 - 400 K/uL    Comment: Performed at [facility], [address], [place], [place] [address]     HEENT: normal Cardio: RRR and No murmur Resp: CTA B/L GI: BS positive and Nontender nondistended Extremity:  No Edema Skin:   Wound Bilateral medial calf incisions with staples in place mild serosanguineous drainage right dorsalis pedis incision clean dry and intact, left groin incision inguinal with Dermabond clean dry and intact Neuro: Alert/Oriented, Abnormal Sensory Reduced sensation at the toes but able to distinguish light touch and Abnormal Motor 5/5 bilateral deltoid, bicep, tricep, grip, 3- at the hip flexors knee extensors 4- ankle plantar flexor dorsiflexor on the left and 3- on the right Musc/Skel:  Normal General no acute distress   Assessment/Plan: 1. Functional deficits secondary to peripheral vascular disease status post right popliteal and tibial embolectomy exploration of the right anterior tibial artery, left femoral to left below-knee popliteal bypass and left popliteal and tibial endarterectomy 01/24/2018 complicated by left  popliteal hematoma which require 3+ hours per day of interdisciplinary therapy in a comprehensive inpatient rehab setting. Physiatrist is providing close team supervision and 24 hour management of active medical problems listed below. Physiatrist and rehab team continue to assess barriers to discharge/monitor patient progress toward functional and medical goals. FIM: Function - Bathing Position: Wheelchair/chair at sink Body parts bathed by patient: Right arm, Left arm, Chest, Abdomen, Front perineal area, Buttocks, Right upper leg, Left upper leg, Right lower leg, Left lower leg, Back Body parts bathed by helper: Right lower leg, Left lower leg, Back Assist Level: Touching or steadying assistance(Pt > 75%)  Function- Upper Body Dressing/Undressing What is the patient wearing?: Pull over shirt/dress Pull over shirt/dress - Perfomed by patient: Thread/unthread right sleeve, Thread/unthread left sleeve, Put head through opening, Pull shirt over trunk Assist Level: Set up Set up : To obtain clothing/put away Function - Lower Body Dressing/Undressing What is the patient wearing?: Pants, Non-skid slipper socks Position: Wheelchair/chair at sink Underwear - Performed by patient: Thread/unthread right underwear leg, Thread/unthread left underwear leg Underwear - Performed by helper: Thread/unthread right underwear leg, Thread/unthread left underwear leg, Pull underwear up/down Pants- Performed by patient: Thread/unthread left pants leg, Pull pants up/down, Thread/unthread right pants leg Pants- Performed by helper: Thread/unthread right pants leg Non-skid slipper socks- Performed by helper: Don/doff right sock, Don/doff left sock Assist for footwear: Maximal assist Assist for lower body dressing: Touching or steadying assistance (Pt > 75%)  Function - Toileting Toileting  steps completed by patient: Adjust clothing prior to toileting, Performs perineal hygiene, Adjust clothing after  toileting Toileting steps completed by helper: Adjust clothing prior to toileting, Performs perineal hygiene, Adjust clothing after toileting(per Ivy Alan Ripperlaire Aquit, NT report) Toileting Assistive Devices: Grab bar or rail Assist level: More than reasonable time  Function Programmer, multimedia- Toilet Transfers Toilet transfer activity did not occur: Set designerefused Toilet transfer assistive device: Elevated toilet seat/BSC over toilet, Grab bar, Cane Assist level to toilet: Supervision or verbal cues Assist level from toilet: Supervision or verbal cues  Function - Chair/bed transfer Chair/bed transfer method: Stand pivot Chair/bed transfer assist level: Touching or steadying assistance (Pt > 75%) Chair/bed transfer assistive device: Environmental consultantWalker, Armrests  Function - Locomotion: Wheelchair Will patient use wheelchair at discharge?: No Type: Manual Wheelchair activity did not occur: Safety/medical concerns Max wheelchair distance: 150' Assist Level: Supervision or verbal cues Wheel 50 feet with 2 turns activity did not occur: Safety/medical concerns Assist Level: Supervision or verbal cues Wheel 150 feet activity did not occur: Safety/medical concerns Assist Level: Supervision or verbal cues Function - Locomotion: Ambulation Assistive device: Walker-rolling Max distance: 10 ft Assist level: Touching or steadying assistance (Pt > 75%) Assist level: Touching or steadying assistance (Pt > 75%) Walk 50 feet with 2 turns activity did not occur: Safety/medical concerns Walk 150 feet activity did not occur: Safety/medical concerns Walk 10 feet on uneven surfaces activity did not occur: Safety/medical concerns  Function - Comprehension Comprehension: Auditory Comprehension assist level: Follows basic conversation/direction with no assist  Function - Expression Expression: Verbal Expression assist level: Expresses basic needs/ideas: With no assist  Function - Social Interaction Social Interaction assist level:  Interacts appropriately 90% of the time - Needs monitoring or encouragement for participation or interaction.  Function - Problem Solving Problem solving assist level: Solves basic 90% of the time/requires cueing < 10% of the time  Function - Memory Memory assist level: More than reasonable amount of time Patient normally able to recall (first 3 days only): Current season, Location of own room, Staff names and faces, That he or she is in a hospital  Medical Problem List and Plan: 1.Debilitysecondary to peripheral vascular disease status post right popliteal and tibial embolectomy exploration right anterior tibial artery left femoral to below-knee popliteal bypass and left popliteal and tibial enterectomy 01/24/2018 complicated by hematoma with evacuation.Pt also with peripheral neuropathy -CIR PT, OT   2. DVT Prophylaxis/Anticoagulation/pulmonary emboli:Xarelto, monitor for blood loss given history of hematoma left popliteal space, repeat CBC stable, therapy notes during conference that pt fatigues in afternoon- d/w pt who states this occurred at home as well 3. Pain Management:Oxycodone as needed, may need to schedule with therapies 4. Mood:Provide emotional support 5. Neuropsych: This patientiscapable of making decisions on hisown behalf. 6. Skin/Wound Care:Routine skin checks -continue local wound care to op-sites/lower extremities 7. Fluids/Electrolytes/Nutrition:Routine in and outs with follow-up chemistries 8.Acute blood loss anemia. Follow-up CBC on 01/30/2018- Hgb stabe at 7.9, but pt fatigues easily came into hospital at hgb14.4 9.Renal insufficiency. Baseline creatinine 1.27-1.55 10.Hypertension. Monitor with increased mobility. Patient on lisinopril 20 mg daily, Lopressor 50 mg twice daily prior to admission.  - Vitals:   02/01/18 2045 02/02/18 0453  BP: 106/82 (!) 104/45  Pulse: 75 72  Resp: 20 18  Temp: 98.6 F (37 C)  98.3 F (36.8 C)  SpO2: 100% 100%  BP currently in a good range 8/22 11.CAD. No chest pain or shortness of breath 12.History of alcohol use. Provide counseling, no signs of  withdrawal           13.Constipation. Laxative assistance, last BM 8/18 at 9 AM, colace 100mg  daily increase to BID , Miralax prn 13, Hypoalb prostat LOS (Days) 6 A FACE TO FACE EVALUATION WAS PERFORMED  Erick Colace 02/02/2018, 7:34 AM

## 2018-02-02 NOTE — Progress Notes (Signed)
Social Work  Fina Heizer, Elveria Risingebecca G, LCSW  Social Worker  Physical Medicine and Rehabilitation  Patient Care Conference  Signed  Date of Service:  02/01/2018 12:42 PM          Signed        Show:Clear all [x] Manual[x] Template[] Copied  Added by: [x] Darold Miley, Lemar Livingsebecca G, LCSW  [] Hover for details Inpatient RehabilitationTeam Conference and Plan of Care Update Date: 02/01/2018   Time: 11:10 AM      Patient Name: Michael EhlersGary S [name]      Medical Record Number: [id number]  Date of Birth: 04/05/1961 Sex: Male         Room/Bed: 1X91Y/7W29F-624W04C/4W04C-01 Payor Info: Payor: MEDICARE / Plan: MEDICARE PART A AND B / Product Type: *No Product type* /     Admitting Diagnosis: Debility  Admit Date/Time:  01/27/2018  5:36 PM Admission Comments: No comment available    Primary Diagnosis:  <principal problem not specified> Principal Problem: <principal problem not specified>       Patient Active Problem List    Diagnosis Date Noted  . Debility    . Ischemia of extremity 01/23/2018  . Frequency of urination and polyuria 07/06/2017  . Erectile dysfunction due to diseases classified elsewhere 10/27/2016  . PAD (peripheral artery disease) (HCC) 08/11/2016  . Nocturia 08/11/2016  . Chronic pain of both knees 04/01/2016  . Palpitations 05/05/2015  . Hyperlipidemia 05/05/2015  . HNP (herniated nucleus pulposus), cervical 10/14/2014  . Chronic low back pain 10/14/2014  . Numbness and tingling of both upper extremities while sleeping 10/14/2014  . Frequent urination 09/03/2014  . Lower abdominal pain 09/03/2014  . Cervical stenosis of spine 05/16/2014  . Essential hypertension 05/16/2014  . Numbness and tingling of right arm 11/28/2013  . Dental abscess 04/26/2013  . Dysuria 04/26/2013  . HTN (hypertension) 04/04/2013  . CAD (coronary artery disease) 05/25/2011      Expected Discharge Date: Expected Discharge Date: 02/09/18   Team Members Present: Physician leading conference: Dr. Claudette LawsAndrew  Kirsteins Social Worker Present: Dossie DerBecky Shalev Helminiak, LCSW Nurse Present: Ronny BaconWhitney Reardon, RN PT Present: Woodfin GanjaEmily Van Shagen, PT OT Present: Ardis Rowanom [name], COTA;Jennifer Katrinka BlazingSmith, OT SLP Present: Feliberto Gottronourtney Payne, SLP PPS Coordinator present : Tora DuckMarie Noel, RN, CRRN       Current Status/Progress Goal Weekly Team Focus  Medical     Patient with pain interfering with therapy.  Still having drainage from wounds  Control pain, monitor wounds  Monitor hemoglobin   Bowel/Bladder     continent of b/b with min A, LBM 8/20  continent of b/b with mod I A  monitor b/b q shift and prn   Swallow/Nutrition/ Hydration               ADL's     bathing at shower level-steady A; LB dressing-min A; functional tranfsers-steady A; pain limitis activity; occasional low BP  independent with assistive device overall  activity tolerance, functional tranfsers, safety awareness, BADL retraining   Mobility     min guard to supervision transfers and short distance gait  mod I  LE strengthening and ROM, endurance and tolerance to standing/upright activity   Communication               Safety/Cognition/ Behavioral Observations             Pain     c/o pain to bilat LE frequently, current oxycontin 10mg  bid, percocet 2 tab q 4 hours prn, pt calls for med q 4 hours, c/o pain of 8, minimal relief  pain <2 with min A  monitor pain q shift and q 4 hours while pain remains acute.   Skin     incisions to L groin with skin glue, incisions to LLE and RLE with staples, LE edema decreasing, still 2+ Right and 1+ to left, RLE incisions with copious drainage, left with minimal drainage, dressing change with abd, kerlix and ace bandage q shift and prn. ace bandages off at HS  maintain skin with mod I A  continue to monitor skin q shift and prn, teach dressing change to pt if he will be going home with dressing changes     *See Care Plan and progress notes for long and short-term goals.      Barriers to Discharge   Current Status/Progress  Possible Resolutions Date Resolved   Physician     Medical stability;Other (comments)  Pain control  Progressing towards goals  Continue rehab, continue medication management      Nursing                 PT  Inaccessible home environment;Decreased caregiver support;Other (comments)(pain)                 OT                 SLP            SW Decreased caregiver support Does not have 24 hr care at DC             Discharge Planning/Teaching Needs:  Home alone with family/friends checking in on him, needs to be mod/i to be able to go home safely      Team Discussion:  Making progress toward his goals of mod/i level. Endurance and activity tolerance issues. Having dressing changes BID. Limited by pain and fatigue which will take time. Knee ROM PT is working on. MD managin wounds and medications.  Revisions to Treatment Plan:  DC 8/29    Continued Need for Acute Rehabilitation Level of Care: The patient requires daily medical management by a physician with specialized training in physical medicine and rehabilitation for the following conditions: Daily direction of a multidisciplinary physical rehabilitation program to ensure safe treatment while eliciting the highest outcome that is of practical value to the patient.: Yes Daily medical management of patient stability for increased activity during participation in an intensive rehabilitation regime.: Yes Daily analysis of laboratory values and/or radiology reports with any subsequent need for medication adjustment of medical intervention for : Post surgical problems   Lucy Chris 02/02/2018, 10:24 AM               Patient ID: Michael [name], male   DOB: [birth year], 57 y.o.   MRN: [id number]

## 2018-02-02 NOTE — Progress Notes (Signed)
Physical Therapy Session Note  Patient Details  Name: Michael [name] MRN: [id number] Date of Birth: [birth year]  Today's Date: 02/02/2018 PT Individual Time: 7482-7078 AND 1510-1555 PT Individual Time Calculation (min): 60 min AND 45 min  Short Term Goals: Week 1:  PT Short Term Goal 1 (Week 1): STGs = LTGs  Skilled Therapeutic Interventions/Progress Updates:   Session 1:  Pt in supine and agreeable to therapy, pain as detailed below. Transferred to EOB and pt donned shorts and shirt w/ set-up assist. Transferred to w/c via stand pivot w/ RW, min guard. Session focused on tolerance to WB, standing, and functional mobility. Pt self-propelled w/c to/from therapy gym w/ BUEs, supervision, for endurance training. Negotiated 2 6" steps w/ min guard and heavy reliance on rails. Pt states he has the materials needed to install rails at his house but cannot have a friend do it until he goes home and can give his friend keys to his house. Reinforced importance of having rails installed before he goes home, but pt isn't concerned about it, will continue to remind him. Ambulated 25' and 15' w/ close supervision and w/c follow for safety. Continues to ambulate at very slow speed, pain remained 5/10 but limited by fatigue. Overall, required prolonged seated rest break to recover. Pt c/o increasing pain, but did not rate, just stated he needed to get back to room to ice and elevate LEs. Self-propelled w/c back to room and ended session in supine, ice applied to BLEs for pain relief, all needs met. Missed 15 min of skilled PT 2/2 pain.   Session 2:  Pt in supine and agreeable to therapy, pain 3/10 in LEs. 15 min added to this session to make up for morning session. Transferred to w/c via stand pivot w/ min guard using RW. Pt requesting to wash clothing today. Pt able to self-propel w/c around room w/ supervision to gather clothing and self-propelled w/c to laundry room as well. Min assist to maneuver w/c to fit  in doorway and pt stood w/ supervision for 2-3 min to sort clothes and start washer. Discussed performing laundry task at home and problem solved how he could do this safely w/o having to carry a laundry basket to/from laundry room. While clothes were washing, pt performed NuStep 10 min @ level 2 for LE strengthening and ROM, no rest breaks needed. Returned to room in w/c, ended session sitting EOB and in care of family, call bell within reach and all needs met.   Therapy Documentation Precautions:  Precautions Precautions: Fall Restrictions Weight Bearing Restrictions: No Pain: Pain Assessment Pain Scale: 0-10 Pain Score: 4  Pain Type: Acute pain Pain Location: Leg Pain Orientation: Right;Left Pain Descriptors / Indicators: Aching;Sore Pain Frequency: Intermittent Pain Onset: On-going Patients Stated Pain Goal: 2 Pain Intervention(s): Medication (See eMAR)  See Function Navigator for Current Functional Status.   Therapy/Group: Individual Therapy  Mittie Knittel K Arnette 02/02/2018, 9:48 AM

## 2018-02-02 NOTE — Progress Notes (Signed)
Occupational Therapy Session Note  Patient Details  Name: Barbarann EhlersGary S Brophy MRN: [id number] Date of Birth: 09/02/1960  Today's Date: 02/02/2018 OT Individual Time: 0700-0825 OT Individual Time Calculation (min): 85 min    Short Term Goals: Week 1:  OT Short Term Goal 1 (Week 1): STG = LTGs due to ELOS  Skilled Therapeutic Interventions/Progress Updates:    OT intervention with focus on functional amb with RW, toileting, functional transfers, bathing at shower level, and dressing with sit<>stand from w/c.  Pt amb with RW to bathroom to use toilet prior to transferring to shower.  Pt issued long handle sponge. Pt requires steady A when standing for bathing buttocks and pulling pants over hips. Pt requires more than a reasonable amount of time to complete tasks with multiple rest breaks. Pt requested to return to bed for pain relief after completed all tasks.  Pt remained in bed with all needs within reach and bed alarm activated.   Therapy Documentation Precautions:  Precautions Precautions: Fall Restrictions Weight Bearing Restrictions: No Pain:  Pt c/o 4/10 BLE pain with activity; RN aware and repositioned  See Function Navigator for Current Functional Status.   Therapy/Group: Individual Therapy  Rich BraveLanier, Baraa Tubbs [name] 02/02/2018, 8:29 AM

## 2018-02-02 NOTE — Plan of Care (Signed)
  Problem: Consults Goal: RH GENERAL PATIENT EDUCATION Description See Patient Education module for education specifics. Outcome: Progressing   Problem: RH SKIN INTEGRITY Goal: RH STG SKIN FREE OF INFECTION/BREAKDOWN Description Patients skin will remain free from further infection or breakdown with mod I assist.  Outcome: Progressing Goal: RH STG MAINTAIN SKIN INTEGRITY WITH ASSISTANCE Description STG Maintain Skin Integrity With mod I Assistance.  Outcome: Progressing Goal: RH STG ABLE TO PERFORM INCISION/WOUND CARE W/ASSISTANCE Description STG Able To Perform Incision/Wound Care With mod Assistance.  Outcome: Progressing   Problem: RH SAFETY Goal: RH STG ADHERE TO SAFETY PRECAUTIONS W/ASSISTANCE/DEVICE Description STG Adhere to Safety Precautions With mod I  Assistance/Device.  Outcome: Progressing Goal: RH STG DECREASED RISK OF FALL WITH ASSISTANCE Description STG Decreased Risk of Fall With mod I Assistance.  Outcome: Progressing   Problem: RH PAIN MANAGEMENT Goal: RH STG PAIN MANAGED AT OR BELOW PT'S PAIN GOAL Description < 4  With min A  Outcome: Progressing   Problem: RH KNOWLEDGE DEFICIT GENERAL Goal: RH STG INCREASE KNOWLEDGE OF SELF CARE AFTER HOSPITALIZATION Description Pt will be able to direct care after discharge with Min assist  From family  Outcome: Progressing   Problem: RH BOWEL ELIMINATION Goal: RH STG MANAGE BOWEL W/MEDICATION W/ASSISTANCE Description Pt will have bm q 2-3 days with min A   Outcome: Progressing  Ross LudwigKAYLA M Maisyn Nouri, LPN

## 2018-02-03 ENCOUNTER — Inpatient Hospital Stay (HOSPITAL_COMMUNITY): Payer: Medicare Other

## 2018-02-03 ENCOUNTER — Inpatient Hospital Stay (HOSPITAL_COMMUNITY): Payer: Medicare Other | Admitting: Physical Therapy

## 2018-02-03 ENCOUNTER — Ambulatory Visit: Payer: Medicare Other | Admitting: Nurse Practitioner

## 2018-02-03 MED ORDER — OXYCODONE HCL 5 MG PO TABS
10.0000 mg | ORAL_TABLET | Freq: Once | ORAL | Status: AC
  Administered 2018-02-03: 10 mg via ORAL
  Filled 2018-02-03: qty 2

## 2018-02-03 NOTE — Progress Notes (Signed)
Physical Therapy Weekly Progress Note  Patient Details  Name: Michael [name] MRN: [id number] Date of Birth: [birth year]  Beginning of progress report period: January 28, 2018 End of progress report period: February 03, 2018  Today's Date: 02/03/2018 PT Individual Time: 1100-1150 PT Individual Time Calculation (min): 50 min   Patient has met 0 of 8 long term goals. Despite this, he is performing all mobility at the supervision level w/ increased time. He continues to be most limited by BLE pain and fatigue, however pain is improving and pt is willing to tolerate a high level of pain in order to participate in therapies. He remains motivated to d/c at a modified independent level. Negotiating 2 stairs remains the biggest barrier to d/c, he requires heavy use of bilateral rails to go up 2 steps. Will continue to encourage pt to have a friend or family member install rails prior to d/c.   Patient continues to demonstrate the following deficits muscle weakness and muscle joint tightness, decreased cardiorespiratoy endurance and decreased standing balance and decreased balance strategies and therefore will continue to benefit from skilled PT intervention to increase functional independence with mobility.  Patient progressing toward long term goals..  Continue plan of care.  PT Short Term Goals Week 1:  PT Short Term Goal 1 (Week 1): STGs = LTGs Week 2:  PT Short Term Goal 1 (Week 2): =LTGs due to ELOS  Skilled Therapeutic Interventions/Progress Updates:   Pt received in w/c and agreeable to therapy, pain as detailed below. Session focused on overall endurance, tolerance to standing, and functional mobility. Self-propelled w/c around unit w/ supervision using BUEs to work on endurance training, frequent brief rest breaks, however increased speed from previous sessions with this therapist. Pt requesting to perform NuStep prior to practicing stairs this session to "loosen up". Transferred to NuStep w/o  RW, stand pivot w/ min guard. Performed NuStep 10 min @ level 2 for LE strengthening and gentle LE ROM. Practiced negotiating steps w/ min guard to close supervision, able to tolerate 4 w/ decreased use of bilateral rails as in previous sessions but still requires them. Returned to room and pt requesting to be in supine to ice LEs. Ended session in supine, call bell within reach and all needs met. Ice applied to BLEs,.   Therapy Documentation Precautions:  Precautions Precautions: Fall Restrictions Weight Bearing Restrictions: No Pain: Pain Assessment Pain Scale: 0-10 Pain Score: 5  Pain Type: Acute pain Pain Location: Leg Pain Orientation: Right Pain Descriptors / Indicators: Stabbing Pain Frequency: Intermittent Pain Onset: With Activity Patients Stated Pain Goal: 6 Pain Intervention(s): Medication (See eMAR)  See Function Navigator for Current Functional Status.  Therapy/Group: Individual Therapy  Laterrica Libman K Arnette 02/03/2018, 11:52 AM

## 2018-02-03 NOTE — Progress Notes (Signed)
Physical Therapy Session Note  Patient Details  Name: Michael [name] MRN: [id number] Date of Birth: 10/10/1960  Today's Date: 02/03/2018 PT Individual Time: 1030-1100 PT Individual Time Calculation (min): 30 min   Short Term Goals: Week 2:  PT Short Term Goal 1 (Week 2): =LTGs due to ELOS  Skilled Therapeutic Interventions/Progress Updates:    Patient received in bed with RN assisting in helping him get ready for therapy, very pleasant and willing to participate in this session. He is able to complete bed mobility with Mod(I) and extended time, and functional transfers with RW with general  S. Able to gait train approximately 12100ft with RW this session and fatigued at end of gait distance. Performed activities in front of dynavision for standing endurance and balance training, patient able to complete with min guard but limited by fatigue. He was left in his WC in the day room ready for next PT session, treating therapist aware.   Therapy Documentation Precautions:  Precautions Precautions: Fall Restrictions Weight Bearing Restrictions: No Pain: Pain Assessment Pain Scale: 0-10 Pain Score: 5  Pain Type: Acute pain Pain Location: Leg Pain Orientation: Right Pain Descriptors / Indicators: Stabbing Pain Onset: With Activity       Therapy/Group: Individual Therapy  Nedra HaiKristen Rutledge Selsor PT, DPT, CBIS  Supplemental Physical Therapist [facility]   Pager [phone]   02/03/2018, 12:22 PM

## 2018-02-03 NOTE — Plan of Care (Signed)
No changes to report. Pt is active with therapy and pain treatment is effective.

## 2018-02-03 NOTE — Progress Notes (Signed)
Occupational Therapy Session Note  Patient Details  Name: Michael [name] MRN: [id number] Date of Birth: 04/19/1961  Today's Date: 02/03/2018 OT Individual Time: 1400-1430 OT Individual Time Calculation (min): 30 min    Short Term Goals: Week 1:  OT Short Term Goal 1 (Week 1): STG = LTGs due to ELOS  Skilled Therapeutic Interventions/Progress Updates:    Pt resting in bed upon arrival.  Pt agreeable to therapy.  Pt initially sat EOB and engaged in sit<>stand and standing balance tasks but c/o increasing pain on dorsal aspect of right foot near staples sites. Pt returned to bed and engaged in BUE therex with green Theraband.  Pt instructed on exercises and schedule for exercises when in bed. Pt remained in bed with guests present and bed alarm activated. All needs within reach.   Therapy Documentation Precautions:  Precautions Precautions: Fall Restrictions Weight Bearing Restrictions: No   Pain: Pain Assessment Pain Score: 8  Pain Type: Acute pain Pain Location: Leg Pain Orientation: Right Pain Onset: With Activity Repositioned  See Function Navigator for Current Functional Status.   Therapy/Group: Individual Therapy  Rich BraveLanier, Iker Nuttall [name] 02/03/2018, 2:34 PM

## 2018-02-03 NOTE — Progress Notes (Signed)
Subjective/Complaints:  Doing better with pain on increased Oxy CR  Review of systems denies chest pain, shortness of breath, nausea, Vomiting, diarrhea, constipation Objective: Vital Signs: Blood pressure 129/77, pulse 71, temperature 99.1 F (37.3 C), temperature source Oral, resp. rate 18, height 5\' 8"  (1.727 m), weight 113.6 kg, SpO2 100 %. No results found. Results for orders placed or performed during the hospital encounter of 01/27/18 (from the past 72 hour(s))  CBC     Status: Abnormal   Collection Time: 02/02/18  4:57 AM  Result Value Ref Range   WBC 8.0 4.0 - 10.5 K/uL   RBC 2.85 (L) 4.22 - 5.81 MIL/uL   Hemoglobin 7.9 (L) 13.0 - 17.0 g/dL   HCT 29.5 (L) 62.1 - 30.8 %   MCV 91.6 78.0 - 100.0 fL   MCH 27.7 26.0 - 34.0 pg   MCHC 30.3 30.0 - 36.0 g/dL   RDW 65.7 84.6 - 96.2 %   Platelets 335 150 - 400 K/uL    Comment: Performed at [facility], [address] [place], [place] [address]     HEENT: normal Cardio: RRR and No murmur Resp: CTA B/L GI: BS positive and Nontender nondistended Extremity:  No Edema Skin:   Wound Bilateral medial calf incisions with staples in place mild serosanguineous drainage right dorsalis pedis incision clean dry and intact, left groin incision inguinal with Dermabond clean dry and intact Neuro: Alert/Oriented, Abnormal Sensory Reduced sensation at the toes but able to distinguish light touch and Abnormal Motor 5/5 bilateral deltoid, bicep, tricep, grip, 3 at the hip flexors knee extensors 4 ankle plantar flexor dorsiflexor on the left and 3 on the right Musc/Skel:  Normal General no acute distress   Assessment/Plan: 1. Functional deficits secondary to peripheral vascular disease status post right popliteal and tibial embolectomy exploration of the right anterior tibial artery, left femoral to left below-knee popliteal bypass and left popliteal and tibial endarterectomy 01/24/2018 complicated by left popliteal hematoma which require 3+  hours per day of interdisciplinary therapy in a comprehensive inpatient rehab setting. Physiatrist is providing close team supervision and 24 hour management of active medical problems listed below. Physiatrist and rehab team continue to assess barriers to discharge/monitor patient progress toward functional and medical goals. FIM: Function - Bathing Position: Shower Body parts bathed by patient: Right arm, Left arm, Chest, Abdomen, Front perineal area, Buttocks, Right upper leg, Left upper leg, Right lower leg, Left lower leg, Back Body parts bathed by helper: Right lower leg, Left lower leg, Back Assist Level: Touching or steadying assistance(Pt > 75%)  Function- Upper Body Dressing/Undressing What is the patient wearing?: Pull over shirt/dress Pull over shirt/dress - Perfomed by patient: Thread/unthread right sleeve, Thread/unthread left sleeve, Put head through opening, Pull shirt over trunk Assist Level: Set up Set up : To obtain clothing/put away Function - Lower Body Dressing/Undressing What is the patient wearing?: Underwear, Pants, Non-skid slipper socks Position: Wheelchair/chair at sink Underwear - Performed by patient: Thread/unthread right underwear leg, Thread/unthread left underwear leg, Pull underwear up/down Underwear - Performed by helper: Thread/unthread right underwear leg, Thread/unthread left underwear leg, Pull underwear up/down Pants- Performed by patient: Thread/unthread left pants leg, Pull pants up/down, Thread/unthread right pants leg Pants- Performed by helper: Thread/unthread right pants leg Non-skid slipper socks- Performed by helper: Don/doff right sock, Don/doff left sock Assist for footwear: Maximal assist Assist for lower body dressing: Touching or steadying assistance (Pt > 75%)  Function - Toileting Toileting steps completed by patient: Adjust  clothing prior to toileting, Performs perineal hygiene, Adjust clothing after toileting Toileting steps  completed by helper: Adjust clothing prior to toileting, Performs perineal hygiene, Adjust clothing after toileting(per Ivy Alan Ripperlaire Aquit, NT report) Toileting Assistive Devices: Grab bar or rail Assist level: Set up/obtain supplies, More than reasonable time  Function Programmer, multimedia- Toilet Transfers Toilet transfer activity did not occur: Refused Toilet transfer assistive device: Elevated toilet seat/BSC over toilet, Grab bar, Walker Assist level to toilet: Supervision or verbal cues Assist level from toilet: Supervision or verbal cues  Function - Chair/bed transfer Chair/bed transfer method: Stand pivot Chair/bed transfer assist level: Touching or steadying assistance (Pt > 75%) Chair/bed transfer assistive device: Environmental consultantWalker, Armrests  Function - Locomotion: Wheelchair Will patient use wheelchair at discharge?: No Type: Manual Wheelchair activity did not occur: Safety/medical concerns Max wheelchair distance: 150' Assist Level: Supervision or verbal cues Wheel 50 feet with 2 turns activity did not occur: Safety/medical concerns Assist Level: Supervision or verbal cues Wheel 150 feet activity did not occur: Safety/medical concerns Assist Level: Supervision or verbal cues Function - Locomotion: Ambulation Assistive device: Walker-rolling Max distance: 25' Assist level: Supervision or verbal cues Assist level: Supervision or verbal cues Walk 50 feet with 2 turns activity did not occur: Safety/medical concerns Walk 150 feet activity did not occur: Safety/medical concerns Walk 10 feet on uneven surfaces activity did not occur: Safety/medical concerns  Function - Comprehension Comprehension: Auditory Comprehension assist level: Follows basic conversation/direction with no assist  Function - Expression Expression: Verbal Expression assist level: Expresses basic needs/ideas: With no assist  Function - Social Interaction Social Interaction assist level: Interacts appropriately 90% of the time -  Needs monitoring or encouragement for participation or interaction.  Function - Problem Solving Problem solving assist level: Solves basic 90% of the time/requires cueing < 10% of the time  Function - Memory Memory assist level: More than reasonable amount of time Patient normally able to recall (first 3 days only): Current season, Location of own room, Staff names and faces, That he or she is in a hospital  Medical Problem List and Plan: 1.Debilitysecondary to peripheral vascular disease status post right popliteal and tibial embolectomy exploration right anterior tibial artery left femoral to below-knee popliteal bypass and left popliteal and tibial enterectomy 01/24/2018 complicated by hematoma with evacuation.Pt also with peripheral neuropathy -CIR PT, OT   2. DVT Prophylaxis/Anticoagulation/pulmonary emboli:Xarelto, monitor for blood loss given history of hematoma left popliteal space, repeat CBC stable, therapy notes during conference that pt fatigues in afternoon- d/w pt who states this occurred at home as well RIght LE swelling post op , already on treatment dose Xarelto, no need for doppler 3. Pain Management:Oxycodone as needed, may need to schedule with therapies 4. Mood:Provide emotional support 5. Neuropsych: This patientiscapable of making decisions on hisown behalf. 6. Skin/Wound Care:Routine skin checks -continue local wound care to op-sites/lower extremities 7. Fluids/Electrolytes/Nutrition:Routine in and outs with follow-up chemistries 8.Acute blood loss anemia. Follow-up CBC on 01/30/2018- Hgb stabe at 7.9, but pt fatigues easily came into hospital at hgb14.4 9.Renal insufficiency. Baseline creatinine 1.27-1.55 10.Hypertension. Monitor with increased mobility. Patient on lisinopril 20 mg daily, Lopressor 50 mg twice daily prior to admission.  - Vitals:   02/02/18 2031 02/03/18 0559  BP: 126/74 129/77  Pulse: 90 71   Resp: 20 18  Temp: 99.1 F (37.3 C) 99.1 F (37.3 C)  SpO2: 100% 100%  BP currently in a good range 8/23 11.CAD. No chest pain or shortness of breath 12.History of alcohol use.  Provide counseling, no signs of withdrawal           13.Constipation. Laxative assistance, last BM 8/18 at 9 AM, colace 100mg  daily increase to BID , Miralax prn, Pt reports 2 BM per day  13, Hypoalb prostat LOS (Days) 7 A FACE TO FACE EVALUATION WAS PERFORMED  Erick Colace 02/03/2018, 7:35 AM

## 2018-02-03 NOTE — Progress Notes (Signed)
Occupational Therapy Session Note  Patient Details  Name: Michael [name] MRN: [id number] Date of Birth: 09/16/1960  Today's Date: 02/03/2018 OT Individual Time: [id number] OT Individual Time Calculation (min): 87 min    Short Term Goals: Week 1:  OT Short Term Goal 1 (Week 1): STG = LTGs due to ELOS  Skilled Therapeutic Interventions/Progress Updates:    OT intervention with focus on functional amb with RW, standing balance, sit<>stand, bathing at shower level, dressing with sit<>stand from w/c, activity tolerance, and safety awareness.  Pt amb with RW (supervision) to bathroom to use toilet prior to transferring to tub bench for shower.  Pt uses long handle sponge to bathe back and BLE. Pt requires steady A for standing balance. Pt requires more than a reasonable amount of time to complete tasks with multiple rest breaks.  Pt does not exhibit any unsafe behaviors.  Pt requested to return to bed at end of session in order to elevate his legs.  Pt performed squat pivot transfer with steady A.    Therapy Documentation Precautions:  Precautions Precautions: Fall Restrictions Weight Bearing Restrictions: No Pain: Pain Assessment Pain Scale: 0-10 Pain Score: 6 Pain Location: Leg Pain Orientation: Right, Left Pain Intervention(s): RN aware and repositioned  See Function Navigator for Current Functional Status.   Therapy/Group: Individual Therapy  Rich BraveLanier, Riane Rung [name] 02/03/2018, 9:58 AM

## 2018-02-04 ENCOUNTER — Inpatient Hospital Stay (HOSPITAL_COMMUNITY): Payer: Medicare Other

## 2018-02-04 DIAGNOSIS — T402X5A Adverse effect of other opioids, initial encounter: Secondary | ICD-10-CM

## 2018-02-04 DIAGNOSIS — N182 Chronic kidney disease, stage 2 (mild): Secondary | ICD-10-CM

## 2018-02-04 DIAGNOSIS — G8918 Other acute postprocedural pain: Secondary | ICD-10-CM

## 2018-02-04 DIAGNOSIS — K5903 Drug induced constipation: Secondary | ICD-10-CM

## 2018-02-04 DIAGNOSIS — D62 Acute posthemorrhagic anemia: Secondary | ICD-10-CM

## 2018-02-04 NOTE — Progress Notes (Signed)
Right leg was changed at 19:50. Medium size drainage remains present. Leg is not hot from the waist down. Pain has been less than yesterday but pt has been lying in the same spot for over 2 hours. Mr. Michael [name] stated he was trying to limit his movement to prevent pain. Nurse encouraged pt to elevate leg above heart if able to tolerate, continue to apply ice for swelling and stretch leg out to assist with blood flow. Pt agrees to report any abnormal changes immediately.

## 2018-02-04 NOTE — Progress Notes (Signed)
Subjective/Complaints: Patient seen lying in bed this morning. He is in good spirits. He slept fairly overnight due to pain from what he believes to be was overexertion during therapies. He also requests an elliptical at his staples. Discussed with nursing overnight last night as well. Discussed again this morning.  Review of systems: +right foot pain. Denies chest pain, shortness of breath, nausea, Vomiting, diarrhea  Objective: Vital Signs: Blood pressure 111/66, pulse 71, temperature 99.1 F (37.3 C), temperature source Oral, resp. rate 18, height 5\' 8"  (1.727 m), weight 113.6 kg, SpO2 99 %. No results found. Results for orders placed or performed during the hospital encounter of 01/27/18 (from the past 72 hour(s))  CBC     Status: Abnormal   Collection Time: 02/02/18  4:57 AM  Result Value Ref Range   WBC 8.0 4.0 - 10.5 K/uL   RBC 2.85 (L) 4.22 - 5.81 MIL/uL   Hemoglobin 7.9 (L) 13.0 - 17.0 g/dL   HCT 40.9 (L) 81.1 - 91.4 %   MCV 91.6 78.0 - 100.0 fL   MCH 27.7 26.0 - 34.0 pg   MCHC 30.3 30.0 - 36.0 g/dL   RDW 78.2 95.6 - 21.3 %   Platelets 335 150 - 400 K/uL    Comment: Performed at [facility], [address], [place], [place] [address]    Constitutional: No distress . Vital signs reviewed. HENT: Normocephalic.  Atraumatic. Eyes: EOMI. No discharge. Cardiovascular: RRR. No JVD. Respiratory: CTA Bilaterally. Normal effort. GI: BS +. Non-distended. Musc: right foot with edema and tenderness Neuro: Alert/Oriented,  Motor: 5/5 bilateral deltoid, bicep, tricep, grip B/l hip flexors knee extensors 3+/5, 4/5 ankle plantar flexor dorsiflexor (right ankle limited due to pain)  Skin:   Wound Bilateral medial calf incisions with staples in place  Right dorsalis pedis incision with mild erythema along stable lines, C/D/I Left groin incision inguinal with Dermabond clean dry and intact  Assessment/Plan: 1. Functional deficits secondary to peripheral vascular disease status  post right popliteal and tibial embolectomy exploration of the right anterior tibial artery, left femoral to left below-knee popliteal bypass and left popliteal and tibial endarterectomy 01/24/2018 complicated by left popliteal hematoma which require 3+ hours per day of interdisciplinary therapy in a comprehensive inpatient rehab setting. Physiatrist is providing close team supervision and 24 hour management of active medical problems listed below. Physiatrist and rehab team continue to assess barriers to discharge/monitor patient progress toward functional and medical goals. FIM: Function - Bathing Position: Shower Body parts bathed by patient: Right arm, Left arm, Chest, Abdomen, Front perineal area, Buttocks, Right upper leg, Left upper leg, Right lower leg, Left lower leg, Back Body parts bathed by helper: Right lower leg, Left lower leg, Back Assist Level: Touching or steadying assistance(Pt > 75%)  Function- Upper Body Dressing/Undressing Upper body dressing/undressing activity did not occur: Refused What is the patient wearing?: Pull over shirt/dress Pull over shirt/dress - Perfomed by patient: Thread/unthread right sleeve, Thread/unthread left sleeve, Put head through opening, Pull shirt over trunk Assist Level: No help, No cues Set up : To obtain clothing/put away Function - Lower Body Dressing/Undressing Lower body dressing/undressing activity did not occur: Refused What is the patient wearing?: Underwear, Non-skid slipper socks, Pants Position: Sitting EOB Underwear - Performed by patient: Thread/unthread right underwear leg, Thread/unthread left underwear leg, Pull underwear up/down Underwear - Performed by helper: Thread/unthread right underwear leg, Thread/unthread left underwear leg, Pull underwear up/down Pants- Performed by patient: Thread/unthread right pants leg, Thread/unthread left pants  leg, Pull pants up/down Pants- Performed by helper: Thread/unthread right pants  leg Non-skid slipper socks- Performed by helper: Don/doff right sock, Don/doff left sock Assist for footwear: Maximal assist Assist for lower body dressing: Set up  Function - Toileting Toileting steps completed by patient: Adjust clothing prior to toileting, Performs perineal hygiene, Adjust clothing after toileting Toileting steps completed by helper: Adjust clothing prior to toileting, Performs perineal hygiene, Adjust clothing after toileting(per Delena Bali Aquit, NT report) Toileting Assistive Devices: Grab bar or rail Assist level: Set up/obtain supplies, More than reasonable time  Function Programmer, multimedia transfer activity did not occur: Set designer transfer assistive device: Elevated toilet seat/BSC over toilet, Grab bar, Walker Assist level to toilet: Supervision or verbal cues Assist level from toilet: Supervision or verbal cues  Function - Chair/bed transfer Chair/bed transfer method: Stand pivot Chair/bed transfer assist level: Supervision or verbal cues Chair/bed transfer assistive device: Environmental consultant, Armrests  Function - Locomotion: Wheelchair Will patient use wheelchair at discharge?: No Type: Manual Wheelchair activity did not occur: Safety/medical concerns Max wheelchair distance: 150' Assist Level: Supervision or verbal cues Wheel 50 feet with 2 turns activity did not occur: Safety/medical concerns Assist Level: Supervision or verbal cues Wheel 150 feet activity did not occur: Safety/medical concerns Assist Level: Supervision or verbal cues Function - Locomotion: Ambulation Assistive device: Walker-rolling Max distance: 100 Assist level: Supervision or verbal cues Assist level: Supervision or verbal cues Walk 50 feet with 2 turns activity did not occur: Safety/medical concerns Assist level: Supervision or verbal cues Walk 150 feet activity did not occur: Safety/medical concerns Walk 10 feet on uneven surfaces activity did not occur: Safety/medical  concerns  Function - Comprehension Comprehension: Auditory Comprehension assist level: Follows basic conversation/direction with no assist  Function - Expression Expression: Verbal Expression assist level: Expresses complex ideas: With extra time/assistive device  Function - Social Interaction Social Interaction assist level: Interacts appropriately with others - No medications needed.  Function - Problem Solving Problem solving assist level: Solves complex problems: Recognizes & self-corrects  Function - Memory Memory assist level: More than reasonable amount of time Patient normally able to recall (first 3 days only): Current season, Location of own room, Staff names and faces, That he or she is in a hospital  Medical Problem List and Plan: 1.Debilitysecondary to peripheral vascular disease status post right popliteal and tibial embolectomy exploration right anterior tibial artery left femoral to below-knee popliteal bypass and left popliteal and tibial endarterectomy 01/24/2018 complicated by hematoma with evacuation.Pt also with peripheral neuropathy Continue CIR  Notes reviewed - bilateral lower extremity vascular procedures, labs reviewed 2. DVT Prophylaxis/Anticoagulation/pulmonary emboli:Xarelto, monitor for blood loss given history of hematoma left popliteal space 3. Pain Management:  Adjusted on 8/23 and again on 8/24-now on OxyContin 15 every 12 and Roxi when necessary 4. Mood:Provide emotional support 5. Neuropsych: This patientiscapable of making decisions on hisown behalf. 6. Skin/Wound Care:Routine skin checks -continue local wound care to op-sites/lower extremities 7. Fluids/Electrolytes/Nutrition:Routine in and outs 8.Acute blood loss anemia.   Hemoglobin 7.9 on 8/22  Continue to monitor 9.Renal insufficiency. Baseline creatinine 1.27-1.55  Creatinine 1.15 on 8/19 10.Hypertension. Monitor with increased mobility.  Patient on lisinopril 20 mg daily, Lopressor 50 mg twice daily prior to admission.  - Vitals:   02/03/18 1938 02/04/18 0418  BP: 120/68 111/66  Pulse: 83 71  Resp: 20 18  Temp: 98.8 F (37.1 C) 99.1 F (37.3 C)  SpO2: 100% 99%   Controlled on 8/24 11.CAD. No chest  pain or shortness of breath 12.History of alcohol use. Provide counseling, no signs of withdrawal 13.Constipation. Laxative assistance, colace 100mg  daily increased to BID , Miralax prn 14, Hypoalb prostat  LOS (Days) 8 A FACE TO FACE EVALUATION WAS PERFORMED  Joory Gough Karis JubaAnil Macarena Langseth 02/04/2018, 8:22 AM

## 2018-02-04 NOTE — Progress Notes (Addendum)
Occupational Therapy Session Note  Patient Details  Name: MACEO HERNAN MRN: [id number] Date of Birth: Jul 17, 1960  Today's Date: 02/04/2018 OT Individual Time: 1520-1545 OT Individual Time Calculation (min): 25 min    Short Term Goals: Week 1:  OT Short Term Goal 1 (Week 1): STG = LTGs due to ELOS  Skilled Therapeutic Interventions/Progress Updates:    1:1. Pt declines OOB therapy therefore willing to do bed level UB exercise. Pt completes BUE therex 1x15 in all planes of motion with 5# dumbells and demonstrational cues to improve BUE strength for BADLs and funcitonal transfers ( shoulder flex/ext, ab/adduct, horizontal abadduct, int/ext rotation, chest press, overhead press, elbow felx/Ext).  Pt completes 3x30 ball tosses with 1.5# wrist weights (overhead pass and chest pass) toimprove BUE endurance required for BADLs. Exited session with pt seated in bed, exit alarm on, call light in reach and all needs met.  Therapy Documentation Precautions:  Precautions Precautions: Fall Restrictions Weight Bearing Restrictions: No General:   Vital Signs: Therapy Vitals Pulse Rate: 71 Resp: 18 BP: 103/66 Patient Position (if appropriate): Lying Oxygen Therapy SpO2: 100 % O2 Device: Room Air Pain:   pain reported in R ankle, RN premedicated, ice and repositioning for pain management  See Function Navigator for Current Functional Status.   Therapy/Group: Individual Therapy  Tonny Branch 02/04/2018, 3:50 PM

## 2018-02-04 NOTE — Progress Notes (Signed)
At the beginning of the shift, patient noted to be in pain with facial grimacing, rocking, shaking & moaning behaviors. Attempted to give him his prn percocet, but could only give one tablet due to the threshold for tylenol being reached. There was no other prn listed on his MAR. On call provider was called & a verbal order was given for a one time dose of oxycodone 10 mg. Ice was applied to the right ankle & lower leg multiple times before his next prn could be given. After administration, patient's dressing to the right leg was changed. The area to the right leg with staples is still draining moderate sero-sanguinous drainage. Area was wrapped as ordered. The right ankle incision was covered by a gauze. There was drainage (minimal) but the area is swollen & red. The whole leg feels very warm to the touch. This area is where he is complaining of the most pain this night.Tghis area looks different than it did the night prior. Patient stated that he felt that he pulled it while on the bike in therapy during the day. Area was cleansed with normal saline & a dry gauze applied. His leg was elevated on a pillow & ice applied. He is afebrile, but a communication was put in for the provider to re-evaluate it & his pain medication. Will continue to monitor for changes.

## 2018-02-05 ENCOUNTER — Inpatient Hospital Stay (HOSPITAL_COMMUNITY): Payer: Medicare Other | Admitting: Occupational Therapy

## 2018-02-05 ENCOUNTER — Inpatient Hospital Stay (HOSPITAL_COMMUNITY): Payer: Medicare Other

## 2018-02-05 DIAGNOSIS — R252 Cramp and spasm: Secondary | ICD-10-CM

## 2018-02-05 MED ORDER — METHOCARBAMOL 500 MG PO TABS
500.0000 mg | ORAL_TABLET | Freq: Two times a day (BID) | ORAL | Status: DC | PRN
  Administered 2018-02-05 – 2018-02-10 (×7): 500 mg via ORAL
  Filled 2018-02-05 (×7): qty 1

## 2018-02-05 NOTE — Progress Notes (Signed)
Occupational Therapy Session Note  Patient Details  Name: Michael [name] MRN: [id number] Date of Birth: March 16, 1961  Today's Date: 02/05/2018 OT Individual Time: 0700-0755 OT Individual Time Calculation (min): 55 min    Short Term Goals: Week 1:  OT Short Term Goal 1 (Week 1): STG = LTGs due to ELOS  Skilled Therapeutic Interventions/Progress Updates:    1:1. Pt reporting 12/10 pain with foot lowered sitting EOB. RN aware. Pt improved pain with foot elevated on trash can while sitting or lying in bed. Pt reports throbbing and stabbing pain in R incision on ankle. Pt completes LB dressing seated EOB to thread BLE using reacher. Pt lies back down d/t pain and unable to stand to complete donning pants. OT instructs in 1 legged bridge technique to advance underwear past hips. Pt completes squat pivot transfer throughout session with close supervision. Pt completes toileting with distant supervision leaning laterally for clothing management and hygiene. Pt with continent BM in toilet after increased time. Pt grooms at sink shaving and oral care with MOD I seated in w/c. Pt requesting to return to bed d/t pain via squat pivot as stated above. OT give VC for managing leg rests. Pt reporting pain becoming more managable with time and movement. Educate pt on importance or repositioning and movement to decrease stiffness and pressure relief. Exited session with pt seated in bed, call light in reach and all needs met  Therapy Documentation Precautions:  Precautions Precautions: Fall Restrictions Weight Bearing Restrictions: Yes General:   Vital Signs: Therapy Vitals Temp: 98.3 F (36.8 C) Temp Source: Oral Pulse Rate: 73 Resp: 18 BP: 127/79 Patient Position (if appropriate): Lying Oxygen Therapy SpO2: 100 % O2 Device: Room Air Pain: Pain Assessment Pain Scale: 0-10 Pain Score: 8  Pain Type: Surgical pain Pain Location: Leg Pain Orientation: Right;Left Pain Descriptors / Indicators:  Aching Pain Frequency: Intermittent Pain Onset: On-going Patients Stated Pain Goal: 1 Pain Intervention(s): Medication (See eMAR)  See Function Navigator for Current Functional Status.   Therapy/Group: Individual Therapy  Tonny Branch 02/05/2018, 7:26 AM

## 2018-02-05 NOTE — Progress Notes (Addendum)
Subjective/Complaints: Patient seen lying in bed this morning. He states he slept fairly overnight. He notes muscle spasms overnight. Evaluated right lower extremity wound with nursing. Patient notes decreasing drainage.  Review of systems: +right foot pain. Denies chest pain, shortness of breath, nausea, Vomiting, diarrhea  Objective: Vital Signs: Blood pressure 127/79, pulse 73, temperature 98.3 F (36.8 C), temperature source Oral, resp. rate 18, height 5\' 8"  (1.727 m), weight 113.6 kg, SpO2 100 %. No results found. No results found for this or any previous visit (from the past 72 hour(s)).  Constitutional: No distress . Vital signs reviewed. HENT: Normocephalic.  Atraumatic. Eyes: EOMI. No discharge. Cardiovascular: RRR. No JVD. Respiratory: CTA bilaterally. Normal effort. GI: BS +. Non-distended. Musc: right foot with edema and tenderness, unchanged Neuro: Alert/Oriented  Motor:  B/l hip flexors knee extensors 4-/5, 4/5 ankle plantar flexor dorsiflexor (right ankle limited due to pain)  Skin:   Wound Bilateral medial calf incisions with staples in place  Right dorsalis pedis incision with mild erythema along stable lines, C/D/I Left groin incision inguinal with Dermabond clean dry and intact Right groin incision with staples and serosanguineous drainage  Assessment/Plan: 1. Functional deficits secondary to peripheral vascular disease status post right popliteal and tibial embolectomy exploration of the right anterior tibial artery, left femoral to left below-knee popliteal bypass and left popliteal and tibial endarterectomy 01/24/2018 complicated by left popliteal hematoma which require 3+ hours per day of interdisciplinary therapy in a comprehensive inpatient rehab setting. Physiatrist is providing close team supervision and 24 hour management of active medical problems listed below. Physiatrist and rehab team continue to assess barriers to discharge/monitor patient progress  toward functional and medical goals. FIM: Function - Bathing Position: Shower Body parts bathed by patient: Right arm, Left arm, Chest, Abdomen, Front perineal area, Buttocks, Right upper leg, Left upper leg, Right lower leg, Left lower leg, Back Body parts bathed by helper: Right lower leg, Left lower leg, Back Assist Level: Touching or steadying assistance(Pt > 75%)  Function- Upper Body Dressing/Undressing Upper body dressing/undressing activity did not occur: Refused What is the patient wearing?: Pull over shirt/dress Pull over shirt/dress - Perfomed by patient: Thread/unthread right sleeve, Thread/unthread left sleeve, Put head through opening, Pull shirt over trunk Assist Level: No help, No cues Set up : To obtain clothing/put away Function - Lower Body Dressing/Undressing Lower body dressing/undressing activity did not occur: Refused What is the patient wearing?: Underwear, Non-skid slipper socks, Pants Position: Sitting EOB Underwear - Performed by patient: Thread/unthread right underwear leg, Thread/unthread left underwear leg, Pull underwear up/down Underwear - Performed by helper: Thread/unthread right underwear leg, Thread/unthread left underwear leg, Pull underwear up/down Pants- Performed by patient: Thread/unthread right pants leg, Thread/unthread left pants leg, Pull pants up/down Pants- Performed by helper: Thread/unthread right pants leg Non-skid slipper socks- Performed by helper: Don/doff right sock, Don/doff left sock Assist for footwear: Maximal assist Assist for lower body dressing: Set up  Function - Toileting Toileting steps completed by patient: Adjust clothing prior to toileting, Performs perineal hygiene, Adjust clothing after toileting Toileting steps completed by helper: Adjust clothing prior to toileting, Performs perineal hygiene, Adjust clothing after toileting(per Delena BaliIvy Claire Aquit, NT report) Toileting Assistive Devices: Grab bar or rail Assist level:  Touching or steadying assistance (Pt.75%)  Function - ArchivistToilet Transfers Toilet transfer activity did not occur: Refused Toilet transfer assistive device: Elevated toilet seat/BSC over toilet, Grab bar, Walker Assist level to toilet: Supervision or verbal cues Assist level from toilet: Supervision or verbal cues  Function - Chair/bed transfer Chair/bed transfer method: Stand pivot Chair/bed transfer assist level: Supervision or verbal cues Chair/bed transfer assistive device: Environmental consultant, Armrests  Function - Locomotion: Wheelchair Will patient use wheelchair at discharge?: No Type: Manual Wheelchair activity did not occur: Safety/medical concerns Max wheelchair distance: 150' Assist Level: Supervision or verbal cues Wheel 50 feet with 2 turns activity did not occur: Safety/medical concerns Assist Level: Supervision or verbal cues Wheel 150 feet activity did not occur: Safety/medical concerns Assist Level: Supervision or verbal cues Function - Locomotion: Ambulation Assistive device: Walker-rolling Max distance: 100 Assist level: Supervision or verbal cues Assist level: Supervision or verbal cues Walk 50 feet with 2 turns activity did not occur: Safety/medical concerns Assist level: Supervision or verbal cues Walk 150 feet activity did not occur: Safety/medical concerns Walk 10 feet on uneven surfaces activity did not occur: Safety/medical concerns  Function - Comprehension Comprehension: Auditory Comprehension assist level: Follows complex conversation/direction with no assist  Function - Expression Expression: Verbal Expression assist level: Expresses complex ideas: With no assist  Function - Social Interaction Social Interaction assist level: Interacts appropriately 90% of the time - Needs monitoring or encouragement for participation or interaction.  Function - Problem Solving Problem solving assist level: Solves complex 90% of the time/cues < 10% of the time  Function -  Memory Memory assist level: More than reasonable amount of time Patient normally able to recall (first 3 days only): Current season, Location of own room, Staff names and faces, That he or she is in a hospital  Medical Problem List and Plan: 1.Debilitysecondary to peripheral vascular disease status post right popliteal and tibial embolectomy exploration right anterior tibial artery left femoral to below-knee popliteal bypass and left popliteal and tibial endarterectomy 01/24/2018 complicated by hematoma with evacuation.Pt also with peripheral neuropathy Continue CIR 2. DVT Prophylaxis/Anticoagulation/pulmonary emboli:Xarelto, monitor for blood loss given history of hematoma left popliteal space 3. Pain Management:  Adjusted on 8/23 and again on 8/24-now on OxyContin 15 every 12 and Roxi when necessary  Robaxin 500 mg twice a day when necessary started on 8/25  Will wean as tolerated 4. Mood:Provide emotional support 5. Neuropsych: This patientiscapable of making decisions on hisown behalf. 6. Skin/Wound Care:Routine skin checks -continue local wound care to op-sites/lower extremities 7. Fluids/Electrolytes/Nutrition:Routine in and outs 8.Acute blood loss anemia.   Hemoglobin 7.9 on 8/22  Continue to monitor 9.Renal insufficiency. Baseline creatinine 1.27-1.55  Creatinine 1.15 on 8/19  Labs ordered for tomorrow 10.Hypertension. Monitor with increased mobility. Patient on lisinopril 20 mg daily, Lopressor 50 mg twice daily prior to admission.  - Vitals:   02/04/18 1921 02/05/18 0442  BP: 110/71 127/79  Pulse: 79 73  Resp: 18 18  Temp: 99.4 F (37.4 C) 98.3 F (36.8 C)  SpO2: 99% 100%   Controlled on 8/25 11.CAD. No chest pain or shortness of breath 12.History of alcohol use. Provide counseling, no signs of withdrawal 13.Constipation. Laxative assistance, colace 100mg  daily increased to BID , Miralax prn 14, Hypoalb  prostat  LOS (Days) 9 A FACE TO FACE EVALUATION WAS PERFORMED  Ankit Karis Juba 02/05/2018, 7:08 AM

## 2018-02-05 NOTE — Progress Notes (Signed)
Occupational Therapy Session Note  Patient Details  Name: Michael [name] MRN: [id number] Date of Birth: 08/15/1960  Today's Date: 02/05/2018 OT Group Time: 1100-1200 OT Group Time Calculation (min): 60 min   Skilled Therapeutic Interventions/Progress Updates: Pt participated in therapeutic w/c level dance group with focus on UE/LE strengthening, activity tolerance, and social participation for carryover during self care tasks. Pt was guided through various dance-based exercises involving UB/LB and trunk. Emphasis placed on endurance, pain mgt via holistic means, and psychosocial wellbeing. Pt actively participated in group, incorporating LEs per his pain tolerance. Requesting songs and engaging with others. He was pleased to meet a group member who attended high school with him. In conjunction with rest breaks, ice packs applied to R LE for pain mgt . At end of session he self propelled back to room.     Therapy Documentation Precautions:  Precautions Precautions: Fall Restrictions Weight Bearing Restrictions: (P) No Pain: Addressed above Pain Assessment Pain Scale: 0-10 Pain Score: 4  Pain Location: Leg Pain Orientation: Right;Left;Lower Pain Descriptors / Indicators: Throbbing;Shooting Pain Intervention(s): Medication (See eMAR) ADL:      See Function Navigator for Current Functional Status.   Therapy/Group: Group Therapy  Ladajah Soltys A Raeanne Deschler 02/05/2018, 12:40 PM

## 2018-02-05 NOTE — Plan of Care (Signed)
Continue to monitor pain, swelling and drainage. First does of robaxin given along with medication.

## 2018-02-06 ENCOUNTER — Inpatient Hospital Stay (HOSPITAL_COMMUNITY): Payer: Self-pay

## 2018-02-06 ENCOUNTER — Inpatient Hospital Stay (HOSPITAL_COMMUNITY): Payer: Medicare Other

## 2018-02-06 LAB — BASIC METABOLIC PANEL
ANION GAP: 10 (ref 5–15)
BUN: 13 mg/dL (ref 6–20)
CALCIUM: 8.9 mg/dL (ref 8.9–10.3)
CHLORIDE: 99 mmol/L (ref 98–111)
CO2: 26 mmol/L (ref 22–32)
CREATININE: 1.01 mg/dL (ref 0.61–1.24)
GFR calc non Af Amer: >60 mL/min (ref >60)
Glucose, Bld: 100 mg/dL — ABNORMAL HIGH (ref 70–99)
Potassium: 3.8 mmol/L (ref 3.5–5.1)
SODIUM: 135 mmol/L (ref 135–145)

## 2018-02-06 LAB — CBC WITH DIFFERENTIAL/PLATELET
ABS IMMATURE GRANULOCYTES: 0.1 10*3/uL (ref 0.0–0.1)
BASOS PCT: 0 %
Basophils Absolute: 0 10*3/uL (ref 0.0–0.1)
EOS ABS: 0.2 10*3/uL (ref 0.0–0.7)
Eosinophils Relative: 1 %
HCT: 31.1 % — ABNORMAL LOW (ref 39.0–52.0)
Hemoglobin: 9.3 g/dL — ABNORMAL LOW (ref 13.0–17.0)
IMMATURE GRANULOCYTES: 0 %
Lymphocytes Relative: 15 %
Lymphs Abs: 2 10*3/uL (ref 0.7–4.0)
MCH: 27 pg (ref 26.0–34.0)
MCHC: 29.9 g/dL — ABNORMAL LOW (ref 30.0–36.0)
MCV: 90.4 fL (ref 78.0–100.0)
MONOS PCT: 10 %
Monocytes Absolute: 1.3 10*3/uL — ABNORMAL HIGH (ref 0.1–1.0)
NEUTROS ABS: 9.8 10*3/uL — AB (ref 1.7–7.7)
Neutrophils Relative %: 74 %
PLATELETS: 399 10*3/uL (ref 150–400)
RBC: 3.44 MIL/uL — AB (ref 4.22–5.81)
RDW: 14.5 % (ref 11.5–15.5)
WBC: 13.3 10*3/uL — AB (ref 4.0–10.5)

## 2018-02-06 MED ORDER — CEPHALEXIN 250 MG PO CAPS
500.0000 mg | ORAL_CAPSULE | Freq: Three times a day (TID) | ORAL | Status: DC
  Administered 2018-02-06 – 2018-02-07 (×4): 500 mg via ORAL
  Filled 2018-02-06 (×4): qty 2

## 2018-02-06 NOTE — Progress Notes (Addendum)
Subjective/Complaints:  No issues except RLE pain, reduced drainage , no on ABD from yesterday as per OT  Review of systems: +right foot pain. Denies chest pain, shortness of breath, nausea, Vomiting, diarrhea  Objective: Vital Signs: Blood pressure 117/63, pulse 78, temperature 98.8 F (37.1 C), temperature source Oral, resp. rate 16, height '5\' 8"'$  (1.727 m), weight 113.6 kg, SpO2 99 %. No results found. Results for orders placed or performed during the hospital encounter of 01/27/18 (from the past 72 hour(s))  Basic metabolic panel     Status: Abnormal   Collection Time: 02/06/18  5:08 AM  Result Value Ref Range   Sodium 135 135 - 145 mmol/L   Potassium 3.8 3.5 - 5.1 mmol/L   Chloride 99 98 - 111 mmol/L   CO2 26 22 - 32 mmol/L   Glucose, Bld 100 (H) 70 - 99 mg/dL   BUN 13 6 - 20 mg/dL   Creatinine, Ser 1.01 0.61 - 1.24 mg/dL   Calcium 8.9 8.9 - 10.3 mg/dL   GFR calc non Af Amer >60 >60 mL/min   GFR calc Af Amer >60 >60 mL/min    Comment: (NOTE) The eGFR has been calculated using the CKD EPI equation. This calculation has not been validated in all clinical situations. eGFR's persistently <60 mL/min signify possible Chronic Kidney Disease.    Anion gap 10 5 - 15    Comment: Performed at [facility] [address] [place], [place] [address]    Constitutional: No distress . Vital signs reviewed. HENT: Normocephalic.  Atraumatic. Eyes: EOMI. No discharge. Cardiovascular: RRR. No JVD. Respiratory: CTA bilaterally. Normal effort. GI: BS +. Non-distended. Musc: right foot with edema and tenderness, unchanged Neuro: Alert/Oriented  Motor:  B/l hip flexors knee extensors 4-/5, 4/5 ankle plantar flexor dorsiflexor (right ankle limited due to pain)  Skin:   Wound Bilateral medial calf incisions with staples in place  Right dorsalis pedis incision with mild erythema along stable lines, C/D/I Left groin incision inguinal with Dermabond clean dry and intact Right groin  incision with staples and serosanguineous drainage  Assessment/Plan: 1. Functional deficits secondary to peripheral vascular disease status post right popliteal and tibial embolectomy exploration of the right anterior tibial artery, left femoral to left below-knee popliteal bypass and left popliteal and tibial endarterectomy 3/78/5885 complicated by left popliteal hematoma which require 3+ hours per day of interdisciplinary therapy in a comprehensive inpatient rehab setting. Physiatrist is providing close team supervision and 24 hour management of active medical problems listed below. Physiatrist and rehab team continue to assess barriers to discharge/monitor patient progress toward functional and medical goals. FIM: Function - Bathing Position: Shower Body parts bathed by patient: Right arm, Left arm, Chest, Abdomen, Front perineal area, Buttocks, Right upper leg, Left upper leg, Right lower leg, Left lower leg, Back Body parts bathed by helper: Right lower leg, Left lower leg, Back Assist Level: Touching or steadying assistance(Pt > 75%)  Function- Upper Body Dressing/Undressing Upper body dressing/undressing activity did not occur: Refused What is the patient wearing?: Pull over shirt/dress Pull over shirt/dress - Perfomed by patient: Thread/unthread right sleeve, Thread/unthread left sleeve, Put head through opening, Pull shirt over trunk Assist Level: No help, No cues Set up : To obtain clothing/put away Function - Lower Body Dressing/Undressing Lower body dressing/undressing activity did not occur: Refused What is the patient wearing?: Underwear, Non-skid slipper socks, Pants Position: Sitting EOB Underwear - Performed by patient: Thread/unthread right underwear leg, Thread/unthread left underwear leg, Pull underwear  up/down Underwear - Performed by helper: Thread/unthread right underwear leg, Thread/unthread left underwear leg, Pull underwear up/down Pants- Performed by patient:  Thread/unthread right pants leg, Thread/unthread left pants leg, Pull pants up/down Pants- Performed by helper: Thread/unthread right pants leg Non-skid slipper socks- Performed by helper: Don/doff right sock, Don/doff left sock Assist for footwear: Maximal assist Assist for lower body dressing: Set up  Function - Toileting Toileting steps completed by patient: Adjust clothing prior to toileting, Performs perineal hygiene, Adjust clothing after toileting Toileting steps completed by helper: Adjust clothing prior to toileting, Performs perineal hygiene, Adjust clothing after toileting(per Haskell Flirt Aquit, NT report) Toileting Assistive Devices: Grab bar or rail Assist level: Touching or steadying assistance (Pt.75%)  Function - Air cabin crew transfer activity did not occur: Refused Toilet transfer assistive device: Elevated toilet seat/BSC over toilet, Grab bar, Walker Assist level to toilet: Supervision or verbal cues Assist level from toilet: Supervision or verbal cues  Function - Chair/bed transfer Chair/bed transfer method: Stand pivot Chair/bed transfer assist level: Supervision or verbal cues Chair/bed transfer assistive device: Environmental consultant, Armrests  Function - Locomotion: Wheelchair Will patient use wheelchair at discharge?: No Type: Manual Wheelchair activity did not occur: Safety/medical concerns Max wheelchair distance: 150' Assist Level: Supervision or verbal cues Wheel 50 feet with 2 turns activity did not occur: Safety/medical concerns Assist Level: Supervision or verbal cues Wheel 150 feet activity did not occur: Safety/medical concerns Assist Level: Supervision or verbal cues Function - Locomotion: Ambulation Assistive device: Walker-rolling Max distance: 100 Assist level: Supervision or verbal cues Assist level: Supervision or verbal cues Walk 50 feet with 2 turns activity did not occur: Safety/medical concerns Assist level: Supervision or verbal  cues Walk 150 feet activity did not occur: Safety/medical concerns Walk 10 feet on uneven surfaces activity did not occur: Safety/medical concerns  Function - Comprehension Comprehension: Auditory Comprehension assist level: Follows complex conversation/direction with no assist  Function - Expression Expression: Verbal Expression assist level: Expresses complex ideas: With no assist  Function - Social Interaction Social Interaction assist level: Interacts appropriately with others - No medications needed.  Function - Problem Solving Problem solving assist level: Solves complex problems: Recognizes & self-corrects  Function - Memory Memory assist level: Complete Independence: No helper Patient normally able to recall (first 3 days only): Current season, Location of own room, Staff names and faces, That he or she is in a hospital  Medical Problem List and Plan: 1.Debilitysecondary to peripheral vascular disease status post right popliteal and tibial embolectomy exploration right anterior tibial artery left femoral to below-knee popliteal bypass and left popliteal and tibial endarterectomy 6/73/4193 complicated by hematoma with evacuation.Pt also with peripheral neuropathy Continue CIR 2. DVT Prophylaxis/Anticoagulation/pulmonary emboli:Xarelto, monitor for blood loss given history of hematoma left popliteal space 3. Pain Management:  Adjusted on 8/23 and again on 8/24-now on OxyContin 15 every 12 and Roxi when necessary  Robaxin 500 mg twice a day when necessary started on 8/25  Goal to  d/c on short acting only for 1 wk supply 4. Mood:Provide emotional support 5. Neuropsych: This patientiscapable of making decisions on hisown behalf. 6. Skin/Wound Care:Routine skin checks -continue local wound care to op-sites/lower extremities 7. Fluids/Electrolytes/Nutrition:Routine in and outs 8.Acute blood loss anemia.   Hemoglobin 7.9 on 8/22  Continue  to monitor 9.Renal insufficiency. Baseline creatinine 1.27-1.55  Creatinine 1.15 on 8/19,1.01 on 8/26  10.Hypertension. Monitor with increased mobility. Patient on lisinopril 20 mg daily, Lopressor 50 mg twice daily prior to admission.  - Vitals:  02/05/18 1921 02/06/18 0515  BP: 127/61 117/63  Pulse: 86 78  Resp: 18 16  Temp: 98.8 F (37.1 C) 98.8 F (37.1 C)  SpO2: 100% 99%   Controlled on 8/26 11.CAD. No chest pain or shortness of breath 12.History of alcohol use. Provide counseling, no signs of withdrawal 13.Constipation. Laxative assistance, colace 15m daily increased to BID , Miralax prn, BM 8/25 14, Hypoalb prostat 15.  Superficial cellulitis RLE add Keflex, afeb, check CBC and diff LOS (Days) 10 A FACE TO FACE EVALUATION WAS PERFORMED  ACharlett Blake8/26/2019, 7:20 AM

## 2018-02-06 NOTE — Progress Notes (Signed)
Occupational Therapy Session Note  Patient Details  Name: Michael [name] MRN: [id number] Date of Birth: 02/12/1961  Today's Date: 02/06/2018 OT Individual Time: 0700-0826 OT Individual Time Calculation (min): 86 min    Short Term Goals: Week 2:  OT Short Term Goal 1 (Week 2): STG = LTGs due to ELOS  Skilled Therapeutic Interventions/Progress Updates:    OT intervention with focus on activity tolerance, functional amb with RW, standing balance, BADL retraining, and safety awareness to increase independence with BADLs. Pt amb with RW to toilet before transferring to shower to complete bathing tasks with sit<>stand from tub bench.  Pt requires more than a reasonable amount of time to complete tasks with rest breaks but is able to complete with supervision. Pt completed dressing tasks with sit<>stand from w/c at sink requiring assistance donning socks without AE. Pt completed grooming tasks standing at sink. Pt engaged in functional amb with RW in room with more than a reasonable amount of time.  Pt c/o increased pain in R foot (dorsal). Pt returned to bed and ice pack applied to painful area.   Therapy Documentation Precautions:  Precautions Precautions: Fall Restrictions Weight Bearing Restrictions: Yes Pain: Pt c/o 8/10 pain R dorsal aspect of foot around staples; RN aware, repostioned, and ice applied See Function Navigator for Current Functional Status.   Therapy/Group: Individual Therapy  Rich BraveLanier, Nijae Doyel [name] 02/06/2018, 8:31 AM

## 2018-02-06 NOTE — Progress Notes (Signed)
Social Work Patient ID: Michael [name], male   DOB: 06/12/1961, 57 y.o.   MRN: [id number] Have taken pt's completed medicaid to Cindy-financial counselor who will get it to DSS. Have given him Food Stamp application to complete. Work on discharge needs for Thursday.

## 2018-02-06 NOTE — Progress Notes (Signed)
Occupational Therapy Weekly Progress Note  Patient Details  Name: Barbarann EhlersGary S Brizuela MRN: [id number] Date of Birth: 11/07/1960  Beginning of progress report period: January 28, 2018 End of progress report period: February 06, 2018  Pt has made steady progress with BADLs during this admission.  Pt amb in room with RW at supervision level to access bathroom for toileting and bathing/dressing tasks.  Pt requires more than a reasonable amount of time to complete tasks. Pt continues to c/o RLE pain limiting activities.  Patient continues to demonstrate the following deficits: muscle weakness, decreased cardiorespiratoy endurance, unbalanced muscle activation, ataxia and decreased coordination and decreased standing balance and decreased balance strategies and therefore will continue to benefit from skilled OT intervention to enhance overall performance with BADL and iADL.  Patient progressing toward long term goals..  Continue plan of care.  OT Short Term Goals Week 1:  OT Short Term Goal 1 (Week 1): STG = LTGs due to ELOS OT Short Term Goal 1 - Progress (Week 1): Progressing toward goal Week 2:  OT Short Term Goal 1 (Week 2): STG = LTGs due to ELOS      Therapy Documentation Precautions:  Precautions Precautions: Fall Restrictions Weight Bearing Restrictions: Yes    See Function Navigator for Current Functional Status.    Lavone NeriLanier, [facility] Cross[facility] CenterChappell 02/06/2018, 8:30 AM

## 2018-02-06 NOTE — Progress Notes (Signed)
Physical Therapy Session Note  Patient Details  Name: Michael EhlersGary S Morozov MRN: [id number] Date of Birth: 08/18/1960  Today's Date: 02/06/2018 PT Individual Time: 1000-1030, 1400-1455 PT Individual Time Calculation (min): 30 min and 55 minutes  Short Term Goals: Week 1:  PT Short Term Goal 1 (Week 1): STGs = LTGs  Skilled Therapeutic Interventions/Progress Updates:    Session 1: Pt supine in bed upon PT arrival, agreeable to therapy tx and reports pain 4/10 in R LE. Pt performed LE strengthening exercises in supine this session, 2 x 10 of each: quad sets bilaterally, R LE SAQ, L LE SLR, heel slides, and hip abduction. Therapist performed 2 x 30 sec heel cord stretch, knee extension stretch and knee flexion stretch with R LE. Pt left supine in bed at end of session with needs in reach and bed alarm set.   Session 2: Pt supine in bed upon Pt arrival, agreeable to therapy tx and reports 6/10 pain in R ankle. Pt transferred to sitting EOB with supervision and performed stand pivot with RW and supervision, verbal cues for techniques. Pt reports increased pain with weightbearing through R LE. Pt propelled w/c to the gym with supervision using B UEs x 150 ft. Pt reported he does not think he can stand again this session secondary to LE pain, reports it is now 10/10. Pt propelled w/c throughout unit with B UEs and supervision. Pt transported from unit>outside. Pt worked on w/c propulsion on unlevel surfaces x 100 ft with supervision and verbal cues for techniques. Pt seated in w/c performed therex for LE strengthening, 2 x 10 each: L LE marches, L LE LAQ, R LE quad sets, R LE LAQ. Pt propelled w/c back to room with supervision. Pt transferred back to bed squat pivot with supervision. Pt transferred to supine with supervision. Therapist assisted to reposition R LE for pain relief and comfort. Therapist also educated pt on heel cord stretch using belt. Pt left supine with needs in reach and chair alarm set. Pt  missed 20 minutes of skilled therapy tx this session secondary to 10/10 pain in R LE.   Therapy Documentation Precautions:  Precautions Precautions: Fall Restrictions Weight Bearing Restrictions: Yes   See Function Navigator for Current Functional Status.   Therapy/Group: Individual Therapy  Cresenciano GenreEmily van Schagen, PT, DPT 02/06/2018, 7:48 AM

## 2018-02-07 ENCOUNTER — Inpatient Hospital Stay (HOSPITAL_COMMUNITY): Payer: Medicare Other | Admitting: Occupational Therapy

## 2018-02-07 ENCOUNTER — Inpatient Hospital Stay (HOSPITAL_COMMUNITY): Payer: Medicare Other

## 2018-02-07 ENCOUNTER — Inpatient Hospital Stay (HOSPITAL_COMMUNITY): Payer: Self-pay | Admitting: Physical Therapy

## 2018-02-07 DIAGNOSIS — M79609 Pain in unspecified limb: Secondary | ICD-10-CM

## 2018-02-07 MED ORDER — CEFAZOLIN SODIUM-DEXTROSE 1-4 GM/50ML-% IV SOLN
1.0000 g | Freq: Three times a day (TID) | INTRAVENOUS | Status: DC
  Administered 2018-02-07 – 2018-02-11 (×13): 1 g via INTRAVENOUS
  Filled 2018-02-07 (×14): qty 50

## 2018-02-07 MED ORDER — SODIUM CHLORIDE 0.9 % IV SOLN
INTRAVENOUS | Status: DC | PRN
  Administered 2018-02-07: 250 mL via INTRAVENOUS

## 2018-02-07 NOTE — Progress Notes (Signed)
DVT unchanged 4 cm hematoma popliteal space Some thrombus persistant in pop and tibial artery not surprising Would not chase this for now.  If symptoms worse would consider I and D of hematoma but risk of DVT PE is not inconsequential so would manage conservatively if we can for now  Would not proceed with redo embolectomy as he has formed clot while on anticoagulation and probably would reform again  Will follow  Fabienne Brunsharles Euretha Najarro MD

## 2018-02-07 NOTE — Progress Notes (Signed)
Occupational Therapy Session Note  Patient Details  Name: Michael [name] MRN: [id number] Date of Birth: 01/13/1961  Today's Date: 02/07/2018 OT Individual Time: 1300-1353 OT Individual Time Calculation (min): 53 min ; missed time due to pain and unable to tolerate out of bed activity   Short Term Goals: Week 1:  OT Short Term Goal 1 (Week 1): STG = LTGs due to ELOS OT Short Term Goal 1 - Progress (Week 1): Progressing toward goal Week 2:  OT Short Term Goal 1 (Week 2): STG = LTGs due to ELOS  Skilled Therapeutic Interventions/Progress Updates:    1:1 Therapeutic exercises with focus on traps, pecs,, bicep, triceps deltoids with 5 lb weights in supine position 15-20 reps each.  Performed quad, abduction, hip extension exercises against gravity in sidelying 20 reps each (bilateally) to focus on strengthing LEs while not tolerating out of bed at this time. Core strengthening with modified sit ups with weighted ball- 2 reps  Therapy Documentation Precautions:  Precautions Precautions: Fall Restrictions Weight Bearing Restrictions: No General: General OT Missed Treatment Reason: Pain   Pain:  unrated but only able to tolerate some activity and not out of bed due to right UE draining and pain See Function Navigator for Current Functional Status.   Therapy/Group: Individual Therapy  Roney MansSmith, Melody Savidge [facility] 02/07/2018, 3:16 PM

## 2018-02-07 NOTE — Progress Notes (Signed)
Occupational Therapy Session Note  Patient Details  Name: Barbarann EhlersGary S Goedde MRN: [id number] Date of Birth: 08/27/1960  Today's Date: 02/07/2018 OT Individual Time: 0930(make up time)-1000 OT Individual Time Calculation (min): 30 min    Short Term Goals: Week 2:  OT Short Term Goal 1 (Week 2): STG = LTGs due to ELOS  Skilled Therapeutic Interventions/Progress Updates:    Upon entering the room, pt supine in bed with 8/10 c/o pain in R LE. Pt reports receiving pain medication prior to OT arrival. Pt declined exiting the bed this session secondary to increased pain. OT educating pt on energy conservation with self care and functional mobility when returning home. Pt with several questions regarding upcoming discharge and therapy recommendations. OT discussed several community mobility situations in regards to energy conservation with pt actively engaged. Pt with no further questions this session. Pt remained in bed with bed alarm activated and call bell within reach upon exiting the room.    Therapy Documentation Precautions:  Precautions Precautions: Fall Restrictions Weight Bearing Restrictions: No General: General PT Missed Treatment Reason: Pain  See Function Navigator for Current Functional Status.   Therapy/Group: Individual Therapy  Alen BleacherBradsher, Lorane Cousar P 02/07/2018, 3:30 PM

## 2018-02-07 NOTE — Plan of Care (Signed)
  Problem: Consults Goal: RH GENERAL PATIENT EDUCATION Description See Patient Education module for education specifics. Outcome: Progressing Goal: Skin Care Protocol Initiated - if Braden Score 18 or less Description If consults are not indicated, leave blank or document N/A Outcome: Progressing   Problem: RH SKIN INTEGRITY Goal: RH STG MAINTAIN SKIN INTEGRITY WITH ASSISTANCE Description STG Maintain Skin Integrity With mod I Assistance.  Outcome: Progressing Goal: RH STG ABLE TO PERFORM INCISION/WOUND CARE W/ASSISTANCE Description STG Able To Perform Incision/Wound Care With mod Assistance.  Outcome: Progressing   Problem: RH SAFETY Goal: RH STG ADHERE TO SAFETY PRECAUTIONS W/ASSISTANCE/DEVICE Description STG Adhere to Safety Precautions With mod I  Assistance/Device.  Outcome: Progressing Goal: RH STG DECREASED RISK OF FALL WITH ASSISTANCE Description STG Decreased Risk of Fall With mod I Assistance.  Outcome: Progressing   Problem: RH PAIN MANAGEMENT Goal: RH STG PAIN MANAGED AT OR BELOW PT'S PAIN GOAL Description < 4  With min A  Outcome: Progressing   Problem: RH KNOWLEDGE DEFICIT GENERAL Goal: RH STG INCREASE KNOWLEDGE OF SELF CARE AFTER HOSPITALIZATION Description Pt will be able to direct care after discharge with Min assist  From family  Outcome: Progressing   Problem: RH BOWEL ELIMINATION Goal: RH STG MANAGE BOWEL W/MEDICATION W/ASSISTANCE Description Pt will have bm q 2-3 days with min A   Outcome: Progressing   Problem: RH SKIN INTEGRITY Goal: RH STG SKIN FREE OF INFECTION/BREAKDOWN Description Patients skin will remain free from further infection or breakdown with mod I assist.  Outcome: Not Progressing

## 2018-02-07 NOTE — Progress Notes (Signed)
Subjective/Complaints:  No issues overnite but has RLE pain with walking  Review of systems: +right foot pain. Denies chest pain, shortness of breath, nausea, Vomiting, diarrhea  Objective: Vital Signs: Blood pressure (!) 117/55, pulse 71, temperature 98.7 F (37.1 C), temperature source Oral, resp. rate 18, height _0  (1.727 m), weight 113.6 kg, SpO2 99 %. No results found. Results for orders placed or performed during the hospital encounter of 01/27/18 (from the past 72 hour(s))  Basic metabolic panel     Status: Abnormal   Collection Time: 02/06/18  5:08 AM  Result Value Ref Range   Sodium 135 135 - 145 mmol/L   Potassium 3.8 3.5 - 5.1 mmol/L   Chloride 99 98 - 111 mmol/L   CO2 26 22 - 32 mmol/L   Glucose, Bld 100 (H) 70 - 99 mg/dL   BUN 13 6 - 20 mg/dL   Creatinine, Ser 1.01 0.61 - 1.24 mg/dL   Calcium 8.9 8.9 - 10.3 mg/dL   GFR calc non Af Amer >60 >60 mL/min   GFR calc Af Amer >60 >60 mL/min    Comment: (NOTE) The eGFR has been calculated using the CKD EPI equation. This calculation has not been validated in all clinical situations. eGFR's persistently <60 mL/min signify possible Chronic Kidney Disease.    Anion gap 10 5 - 15    Comment: Performed at [facility] [address], [place], [place] [address]  CBC with Differential/Platelet     Status: Abnormal   Collection Time: 02/06/18  8:01 AM  Result Value Ref Range   WBC 13.3 (H) 4.0 - 10.5 K/uL   RBC 3.44 (L) 4.22 - 5.81 MIL/uL   Hemoglobin 9.3 (L) 13.0 - 17.0 g/dL   HCT 31.1 (L) 39.0 - 52.0 %   MCV 90.4 78.0 - 100.0 fL   MCH 27.0 26.0 - 34.0 pg   MCHC 29.9 (L) 30.0 - 36.0 g/dL   RDW 14.5 11.5 - 15.5 %   Platelets 399 150 - 400 K/uL   Neutrophils Relative % 74 %   Neutro Abs 9.8 (H) 1.7 - 7.7 K/uL   Lymphocytes Relative 15 %   Lymphs Abs 2.0 0.7 - 4.0 K/uL   Monocytes Relative 10 %   Monocytes Absolute 1.3 (H) 0.1 - 1.0 K/uL   Eosinophils Relative 1 %   Eosinophils Absolute 0.2 0.0 - 0.7 K/uL    Basophils Relative 0 %   Basophils Absolute 0.0 0.0 - 0.1 K/uL   Immature Granulocytes 0 %   Abs Immature Granulocytes 0.1 0.0 - 0.1 K/uL    Comment: Performed at [facility], [employer] [address] [place], [place] [address]    Constitutional: No distress . Vital signs reviewed. HENT: Normocephalic.  Atraumatic. Eyes: EOMI. No discharge. Cardiovascular: RRR. No JVD. Respiratory: CTA bilaterally. Normal effort. GI: BS +. Non-distended. Musc: right foot with edema and tenderness, unchanged Neuro: Alert/Oriented  Motor:  B/l hip flexors knee extensors 4-/5, 4/5 ankle plantar flexor dorsiflexor (right ankle limited due to pain)  Skin:   Wound Bilateral medial calf incisions with staples in place  Right dorsalis pedis incision with mild erythema along stable lines, C/D/I Left groin incision inguinal with Dermabond clean dry and intact Right groin incision with staples and serosanguineous drainage  Assessment/Plan: 1. Functional deficits secondary to peripheral vascular disease status post right popliteal and tibial embolectomy exploration of the right anterior tibial artery, left femoral to left below-knee popliteal bypass and left popliteal and tibial  endarterectomy 7/49/4496 complicated by left popliteal hematoma which require 3+ hours per day of interdisciplinary therapy in a comprehensive inpatient rehab setting. Physiatrist is providing close team supervision and 24 hour management of active medical problems listed below. Physiatrist and rehab team continue to assess barriers to discharge/monitor patient progress toward functional and medical goals. FIM: Function - Bathing Position: Shower Body parts bathed by patient: Right arm, Left arm, Chest, Abdomen, Front perineal area, Buttocks, Right upper leg, Left upper leg, Right lower leg, Left lower leg, Back Body parts bathed by helper: Right lower leg, Left lower leg, Back Assist Level: Supervision or verbal cues  Function- Upper Body  Dressing/Undressing Upper body dressing/undressing activity did not occur: Refused What is the patient wearing?: Pull over shirt/dress Pull over shirt/dress - Perfomed by patient: Thread/unthread right sleeve, Thread/unthread left sleeve, Put head through opening, Pull shirt over trunk Assist Level: No help, No cues Set up : To obtain clothing/put away Function - Lower Body Dressing/Undressing Lower body dressing/undressing activity did not occur: Refused What is the patient wearing?: Underwear, Pants, Non-skid slipper socks Position: Sitting EOB Underwear - Performed by patient: Thread/unthread right underwear leg, Thread/unthread left underwear leg, Pull underwear up/down Underwear - Performed by helper: Thread/unthread right underwear leg, Thread/unthread left underwear leg, Pull underwear up/down Pants- Performed by patient: Thread/unthread right pants leg, Thread/unthread left pants leg, Pull pants up/down Pants- Performed by helper: Thread/unthread right pants leg Non-skid slipper socks- Performed by helper: Don/doff right sock, Don/doff left sock Assist for footwear: Maximal assist Assist for lower body dressing: Supervision or verbal cues  Function - Toileting Toileting steps completed by patient: Adjust clothing prior to toileting, Performs perineal hygiene, Adjust clothing after toileting Toileting steps completed by helper: Adjust clothing prior to toileting, Performs perineal hygiene, Adjust clothing after toileting(per Haskell Flirt Aquit, NT report) Toileting Assistive Devices: Grab bar or rail Assist level: Touching or steadying assistance (Pt.75%)  Function - Air cabin crew transfer activity did not occur: Refused Toilet transfer assistive device: Elevated toilet seat/BSC over toilet, Grab bar, Walker Assist level to toilet: Supervision or verbal cues Assist level from toilet: Supervision or verbal cues  Function - Chair/bed transfer Chair/bed transfer method:  Stand pivot Chair/bed transfer assist level: Supervision or verbal cues Chair/bed transfer assistive device: Environmental consultant, Armrests  Function - Locomotion: Wheelchair Will patient use wheelchair at discharge?: No Type: Manual Wheelchair activity did not occur: Safety/medical concerns Max wheelchair distance: 150' Assist Level: Supervision or verbal cues Wheel 50 feet with 2 turns activity did not occur: Safety/medical concerns Assist Level: Supervision or verbal cues Wheel 150 feet activity did not occur: Safety/medical concerns Assist Level: Supervision or verbal cues Function - Locomotion: Ambulation Assistive device: Walker-rolling Max distance: 100 Assist level: Supervision or verbal cues Assist level: Supervision or verbal cues Walk 50 feet with 2 turns activity did not occur: Safety/medical concerns Assist level: Supervision or verbal cues Walk 150 feet activity did not occur: Safety/medical concerns Walk 10 feet on uneven surfaces activity did not occur: Safety/medical concerns  Function - Comprehension Comprehension: Auditory Comprehension assist level: Understands complex 90% of the time/cues 10% of the time  Function - Expression Expression: Verbal Expression assist level: Expresses basic 90% of the time/requires cueing < 10% of the time.  Function - Social Interaction Social Interaction assist level: Interacts appropriately 90% of the time - Needs monitoring or encouragement for participation or interaction.  Function - Problem Solving Problem solving assist level: Solves complex 90% of the time/cues < 10% of the  time  Function - Memory Memory assist level: Recognizes or recalls 90% of the time/requires cueing < 10% of the time Patient normally able to recall (first 3 days only): Current season, Location of own room, Staff names and faces, That he or she is in a hospital  Medical Problem List and Plan: 1.Debilitysecondary to peripheral vascular disease status post  right popliteal and tibial embolectomy exploration right anterior tibial artery left femoral to below-knee popliteal bypass and left popliteal and tibial endarterectomy 8/55/0158 complicated by hematoma with evacuation.Pt also with peripheral neuropathy Continue CIR team conf in am     2. DVT Prophylaxis/Anticoagulation/pulmonary emboli:Xarelto, monitor for blood loss given history of hematoma left popliteal space 3. Pain Management:  Adjusted on 8/23 and again on 8/24-now on OxyContin 15 every 12 and Roxi when necessary  Robaxin 500 mg twice a day when necessary started on 8/25  Goal to  d/c on short acting only for 1 wk supply, pain should subside with tx of cellulitis 4. Mood:Provide emotional support 5. Neuropsych: This patientiscapable of making decisions on hisown behalf. 6. Skin/Wound Care:Routine skin checks -continue local wound care to op-sites/lower extremities 7. Fluids/Electrolytes/Nutrition:Routine in and outs 8.Acute blood loss anemia.   Hemoglobin 7.9 on 8/22  Continue to monitor 9.Renal insufficiency. Baseline creatinine 1.27-1.55  Creatinine 1.15 on 8/19,1.01 on 8/26  10.Hypertension. Monitor with increased mobility. Patient on lisinopril 20 mg daily, Lopressor 50 mg twice daily prior to admission.  - Vitals:   02/06/18 1950 02/07/18 0519  BP: 114/77 (!) 117/55  Pulse: 87 71  Resp: 18 18  Temp: 98.8 F (37.1 C) 98.7 F (37.1 C)  SpO2: 99% 99%   Controlled on 8/27 11.CAD. No chest pain or shortness of breath 12.History of alcohol use. Provide counseling, no signs of withdrawal 13.Constipation. Laxative assistance, colace 143m daily increased to BID , Miralax prn, BM 8/25 14, Hypoalb prostat 15.  Superficial cellulitis RLE add Keflex, afeb, elevated WBC, area painful change to IV ancef, ask VVS to eval wound LOS (Days) 11 A FACE TO FACE EVALUATION WAS PERFORMED  ACharlett Blake8/27/2019, 7:24 AM

## 2018-02-07 NOTE — Progress Notes (Signed)
[facility]  Subjective  - pain right foot ankle calf, drainage from ankle when he stands   Objective (!) 117/55 71 98.7 F (37.1 C) (Oral) 18 99%  Intake/Output Summary (Last 24 hours) at 02/07/2018 1112 Last data filed at 02/07/2018 0515 Gross per 24 hour  Intake 720 ml  Output 2150 ml  Net -1430 ml   Right lower extremity diffusely swollen, mild peri incisional erythema and skin edge necrosis at ankle mild erythema at below knee incision, no expressible drainage or obvious fluctuance on my exam  Bilateral feet warm no palpable pulses in feet  Left groin and below knee incision healing well  Assessment/Planning: Right leg pain and swelling with known DVT.  Will get duplex to assess interval change  Drainage from foot not currently expressible watchful waiting for now.  Agree with keflex.  Arterial duplex to assess overall flow and rule out hematoma.  Pt is on xarelto for DVT and PE.  Michael [name] 02/07/2018 11:12 AM --  Laboratory Lab Results: Recent Labs    02/06/18 0801  WBC 13.3*  HGB 9.3*  HCT 31.1*  PLT 399   BMET Recent Labs    02/06/18 0508  NA 135  K 3.8  CL 99  CO2 26  GLUCOSE 100*  BUN 13  CREATININE 1.01  CALCIUM 8.9    COAG Lab Results  Component Value Date   INR 1.19 01/24/2018   INR 1.00 01/23/2018   INR 0.92 03/31/2011   No results found for: PTT

## 2018-02-07 NOTE — Progress Notes (Signed)
*  Preliminary Results* Right lower extremity venous duplex completed. Right lower extremity is positive for acute deep vein thrombosis involving the saphenofemoral junction, common femoral, femoral, popliteal, posterior tibial, and peroneal veins. There is no evidence of right Baker's cyst.  Right lower extremity arterial duplex completed. There is evidence of soft, possibly slightly mobile, thrombus in the right popliteal artery. There is perfusion to the foot via a collateralized posterior tibial artery and anterior tibial artery, although the anterior tibial artery has dampened flow.  Incidental finding: there is a heterogenous area of the right proximal medial calf measuring 4.1cm.  Preliminary results discussed with Dr. Darrick PennaFields.  02/07/2018 12:35 PM  Gertie FeyMichelle Shanetta Nicolls, MHA, RVT, RDCS, RDMS

## 2018-02-07 NOTE — Progress Notes (Signed)
Physical Therapy Session Note  Patient Details  Name: Michael [name] MRN: [id number] Date of Birth: 11-01-1960  Today's Date: 02/07/2018 PT Individual Time: 4695-0722 VJD 0518-3358 PT Individual Time Calculation (min): 30 min AND 22 min Missed time (min): 30 min (pain)   Short Term Goals: Week 2:  PT Short Term Goal 1 (Week 2): =LTGs due to ELOS  Skilled Therapeutic Interventions/Progress Updates:   Session 1:  Pt in supine and agreeable to therapy, pain 5/10 in RLE, but declining OOB activity until he hears from vascular surgeon regarding his R leg. Performed RLE strengthening and stretching including quad sets 3x10, assisted heel slides 3x10, assisted ankle pumps 3x5, passive hamstring and gastroc stretch within available range 30 sec x5 reps. Emphasis on slow and controlled ranges of motion for pain relief and gentle introduction of pain-free movement. Ended session in supine, call bell within reach and all needs met. Missed 30 min of skilled PT 2/2 pain.   Session 2:  Pt in supine and agreeable to attempt OOB therapy, pain 5/10. Transferred to EOB and to w/c w/ supervision and increased time 2/2 pain and fear of movement. Pain now 10/10 but requesting to continue to work through it. Worked on overall endurance and UE strengthening this session. Self-propelled w/c >150' w/ supervision and increased time 2/2 pain. Pt continuing to yell out in pain. Returned to supine w/ supervision and ended session in supine, call bell within reach and all needs met. RN aware of pain level.   Therapy Documentation Precautions:  Precautions Precautions: Fall Restrictions Weight Bearing Restrictions: No  See Function Navigator for Current Functional Status.   Therapy/Group: Individual Therapy  Brittney Caraway K Arnette 02/07/2018, 2:43 PM

## 2018-02-07 NOTE — Progress Notes (Signed)
Occupational Therapy Session Note  Patient Details  Name: Michael [name] MRN: [id number] Date of Birth: [birth year]  Today's Date: 02/07/2018 OT Individual Time: 0730-0820 OT Individual Time Calculation (min): 50 min  OT Missed Time: 10 minutes d/t pt toileting  Short Term Goals: Week 2:  OT Short Term Goal 1 (Week 2): STG = LTGs due to ELOS  Skilled Therapeutic Interventions/Progress Updates:    Pt supine in bed c/o pain as described below agreeable to therapy. Pt completed squat pivot bed> w/c and <> toilet with (S). Pt able to doff shorts standing at toilet with use of B grab bars with (S). Increased time required RN summoned d/t pus running out of sutures on pt's R foot and pt c/o increased pain. Pt set up at sink and completed grooming and b/d tasks with (S) from seated level d/t increased pain with R foot. Pt requested to return to bed d/t pain and drainage in foot. Pt completed B UE strengthening circuit with a 5lb weighted dowel and green resistance band. Exercises focused on functional B UE strength, including elbow flexion/extension, shoulder flexion, and scapular depression/abduction/adduction. Activity graded throughout with vc for technique and increased resistance added when needed. Pt left supine in bed with all needs met and bed alarm set.   Therapy Documentation Precautions:  Precautions Precautions: Fall Restrictions Weight Bearing Restrictions: No  General OT Amount of Missed Time: 10 Minutes Vital Signs: Therapy Vitals Temp: 98.7 F (37.1 C) Temp Source: Oral Pulse Rate: 71 Resp: 18 BP: (!) 117/55 Patient Position (if appropriate): Lying Oxygen Therapy SpO2: 99 % O2 Device: Room Air Pain: Pain Assessment Pain Scale: Faces Pain Score: 5 (working with therapy) Faces Pain Scale: Hurts even more Pain Type: Surgical pain Pain Location: Foot Pain Orientation: Right Pain Descriptors / Indicators: Aching Pain Frequency: Intermittent Pain Onset:  On-going Patients Stated Pain Goal: 3 Pain Intervention(s): Emotional support;Ambulation/increased activity  See Function Navigator for Current Functional Status.   Therapy/Group: Individual Therapy  Curtis Sites 02/07/2018, 8:33 AM

## 2018-02-08 ENCOUNTER — Encounter (HOSPITAL_COMMUNITY): Payer: Medicare Other | Admitting: Psychology

## 2018-02-08 ENCOUNTER — Inpatient Hospital Stay (HOSPITAL_COMMUNITY): Payer: Self-pay

## 2018-02-08 ENCOUNTER — Inpatient Hospital Stay (HOSPITAL_COMMUNITY): Payer: Medicare Other

## 2018-02-08 DIAGNOSIS — F429 Obsessive-compulsive disorder, unspecified: Secondary | ICD-10-CM
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
## 2018-02-08 NOTE — Progress Notes (Signed)
[facility]  Subjective  - feels better   Objective 132/74 70 98.7 F (37.1 C) (Oral) 18 100%  Intake/Output Summary (Last 24 hours) at 02/08/2018 [id number] Last data filed at 02/08/2018 [id number] Gross per 24 hour  Intake 1034.95 ml  Output 2350 ml  Net -1315.05 ml   Both feet warm Edema right leg unchanged  Assessment/Planning: Less pain today Continue rehab Will reschedule his outpt appt from tomorrow to a few weeks later Leave staples in for now.  Too swollen  Michael BrunsCharles [name] 02/08/2018 8:12 AM --  Laboratory Lab Results: Recent Labs    02/06/18 0801  WBC 13.3*  HGB 9.3*  HCT 31.1*  PLT 399   BMET Recent Labs    02/06/18 0508  NA 135  K 3.8  CL 99  CO2 26  GLUCOSE 100*  BUN 13  CREATININE 1.01  CALCIUM 8.9    COAG Lab Results  Component Value Date   INR 1.19 01/24/2018   INR 1.00 01/23/2018   INR 0.92 03/31/2011   No results found for: PTT

## 2018-02-08 NOTE — Progress Notes (Signed)
Occupational Therapy Note  Patient Details  Name: Michael [name] MRN: [id number] Date of Birth: 10/30/1960  Today's Date: 02/08/2018 OT Missed Time: 30 Minutes Missed Time Reason: Pain  Pt missed 30 mins skilled OT services secondary to RLE foot pain. Repositioned and RN aware.   Lavone NeriLanier, [name] [facility] 02/08/2018, 2:21 PM
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
## 2018-02-08 NOTE — Progress Notes (Signed)
Social Work  Florina Glas, Elveria Rising  Social Worker  Physical Medicine and Rehabilitation  Patient Care Conference  Signed  Date of Service:  02/08/2018  1:07 PM          Signed        Show:Clear all [x] Manual[x] Template[] Copied  Added by: [x] Jasenia Weilbacher, Lemar Livings, LCSW  [] Hover for details Inpatient RehabilitationTeam Conference and Plan of Care Update Date: 02/08/2018   Time: 11:10 AM      Patient Name: Michael [name]      Medical Record Number: [id number]  Date of Birth: Jan 12, 1961 Sex: Male         Room/Bed: 4U98J/1B14N-82 Payor Info: Payor: MEDICARE / Plan: MEDICARE PART A AND B / Product Type: *No Product type* /     Admitting Diagnosis: Debility  Admit Date/Time:  01/27/2018  5:36 PM Admission Comments: No comment available    Primary Diagnosis:  <principal problem not specified> Principal Problem: <principal problem not specified>       Patient Active Problem List    Diagnosis Date Noted  . Obsessive-compulsive disorder    . Muscle cramps    . Postoperative pain    . Therapeutic opioid induced constipation    . CKD (chronic kidney disease), stage II    . Acute blood loss anemia    . Debility    . Ischemia of extremity 01/23/2018  . Frequency of urination and polyuria 07/06/2017  . Erectile dysfunction due to diseases classified elsewhere 10/27/2016  . PAD (peripheral artery disease) (HCC) 08/11/2016  . Nocturia 08/11/2016  . Chronic pain of both knees 04/01/2016  . Palpitations 05/05/2015  . Hyperlipidemia 05/05/2015  . HNP (herniated nucleus pulposus), cervical 10/14/2014  . Chronic low back pain 10/14/2014  . Numbness and tingling of both upper extremities while sleeping 10/14/2014  . Frequent urination 09/03/2014  . Lower abdominal pain 09/03/2014  . Cervical stenosis of spine 05/16/2014  . Essential hypertension 05/16/2014  . Numbness and tingling of right arm 11/28/2013  . Dental abscess 04/26/2013  . Dysuria 04/26/2013  . HTN (hypertension)  04/04/2013  . CAD (coronary artery disease) 05/25/2011      Expected Discharge Date: Expected Discharge Date: 02/11/18   Team Members Present: Physician leading conference: Dr. Claudette Laws Social Worker Present: Dossie Der, LCSW Nurse Present: Regino Schultze, RN PT Present: Woodfin Ganja, PT OT Present: Roney Mans, OT;Ardis Rowan, COTA SLP Present: Feliberto Gottron, SLP PPS Coordinator present : Tora Duck, RN, [facility]       Current Status/Progress Goal Weekly Team Focus  Medical     Patient without fever, appreciate wound follow-up from vascular surgery.  Elevated white count.  On IV antibiotic  Maintain medical stability, pain control  Discharge planning, IV antibiotics   Bowel/Bladder     continent of bowel and bladder LBM 02-06-18  remain continent of b/b  Assist with tolieting needs prn, assess bowel q shift   Swallow/Nutrition/ Hydration               ADL's     BADLs-supervision/independent with assistive device  independent with assistive device overall  activity tolerance, discharge planning, safety awareness   Mobility     supervision transfers and gait, min assist stairs  Mod I   LE strengthening and ROM, endurance and OOB tolerance, household mobility, stairs   Communication               Safety/Cognition/ Behavioral Observations  Pain     pain in bilateral lower extremity managed with oxycontin 10mg  bid, percocet 2 tab q 4h prn,  pain < or = 4  Assess pain q shift and prn   Skin     RLE staples Right foot staples minimal drainage edema improving , LLE incision healing   improving incision and no new skin issues  assess skin q shift and assess healing of incision     *See Care Plan and progress notes for long and short-term goals.      Barriers to Discharge   Current Status/Progress Possible Resolutions Date Resolved   Physician     IV antibiotics;Decreased caregiver support;Medical stability     Progressing towards goals overall minor  setback with his wound infection  May need to extend length of stay 1 to 2 days, if there is functional decline      Nursing                 PT  Inaccessible home environment;Decreased caregiver support;Other (comments);Wound Care  pain, steps to enter house w/o rails  pt planning to install rails at d/c, encouraging him to have friend do so before d/c           OT                 SLP            SW              Discharge Planning/Teaching Needs:  Needs to be mod/i before going home due to only has intermittent assist with family and friends. Has been limited in his participation in therapies the past few days due to pain      Team Discussion:  Progressing toward his goals of mod/i-supervision level. Pain and oozing wound yesterday and Dr. Darrick Penna came back in to look at and give his recommendations. Staples in and will go home with due to leg too swollen to remove. Switch to po antibiotics once discharge currently on IV antibiotics. Neuro-psych seeing. Missed therapy session yesterday due to leg issues. Will need a few more days to reach the goals for home. Extended to Sat 8/31  Revisions to Treatment Plan:  Discharge extended 8/31    Continued Need for Acute Rehabilitation Level of Care: The patient requires daily medical management by a physician with specialized training in physical medicine and rehabilitation for the following conditions: Daily direction of a multidisciplinary physical rehabilitation program to ensure safe treatment while eliciting the highest outcome that is of practical value to the patient.: Yes Daily medical management of patient stability for increased activity during participation in an intensive rehabilitation regime.: Yes Daily analysis of laboratory values and/or radiology reports with any subsequent need for medication adjustment of medical intervention for : Post surgical problems;Wound care problems     I attest that I was present, lead the team conference,  and concur with the assessment and plan of the team.     Lucy Chris 02/08/2018, 1:07 PM                Gretna Bergin, Lemar Livings, LCSW  Social Worker  Physical Medicine and Rehabilitation  Patient Care Conference  Signed  Date of Service:  02/01/2018 12:42 PM          Signed        Show:Clear all [x] Manual[x] Template[] Copied  Added by: [x] Tarry Fountain, Lemar Livings, LCSW  [] Hover for details Inpatient RehabilitationTeam Conference and Plan of Care Update  Date: 02/01/2018   Time: 11:10 AM      Patient Name: Michael [name]      Medical Record Number: [id number]  Date of Birth: 04/05/1961 Sex: Male         Room/Bed: 4U98J/1B14N-824W04C/4W04C-01 Payor Info: Payor: MEDICARE / Plan: MEDICARE PART A AND B / Product Type: *No Product type* /     Admitting Diagnosis: Debility  Admit Date/Time:  01/27/2018  5:36 PM Admission Comments: No comment available    Primary Diagnosis:  <principal problem not specified> Principal Problem: <principal problem not specified>       Patient Active Problem List    Diagnosis Date Noted  . Debility    . Ischemia of extremity 01/23/2018  . Frequency of urination and polyuria 07/06/2017  . Erectile dysfunction due to diseases classified elsewhere 10/27/2016  . PAD (peripheral artery disease) (HCC) 08/11/2016  . Nocturia 08/11/2016  . Chronic pain of both knees 04/01/2016  . Palpitations 05/05/2015  . Hyperlipidemia 05/05/2015  . HNP (herniated nucleus pulposus), cervical 10/14/2014  . Chronic low back pain 10/14/2014  . Numbness and tingling of both upper extremities while sleeping 10/14/2014  . Frequent urination 09/03/2014  . Lower abdominal pain 09/03/2014  . Cervical stenosis of spine 05/16/2014  . Essential hypertension 05/16/2014  . Numbness and tingling of right arm 11/28/2013  . Dental abscess 04/26/2013  . Dysuria 04/26/2013  . HTN (hypertension) 04/04/2013  . CAD (coronary artery disease) 05/25/2011      Expected Discharge Date:  Expected Discharge Date: 02/09/18   Team Members Present: Physician leading conference: Dr. Claudette LawsAndrew Kirsteins Social Worker Present: Dossie DerBecky Taryll Reichenberger, LCSW Nurse Present: Ronny BaconWhitney Reardon, RN PT Present: Woodfin GanjaEmily Van Shagen, PT OT Present: Ardis Rowanom [name], COTA;Jennifer Katrinka BlazingSmith, OT SLP Present: Feliberto Gottronourtney Payne, SLP PPS Coordinator present : Tora DuckMarie Noel, RN, CRRN       Current Status/Progress Goal Weekly Team Focus  Medical     Patient with pain interfering with therapy.  Still having drainage from wounds  Control pain, monitor wounds  Monitor hemoglobin   Bowel/Bladder     continent of b/b with min A, LBM 8/20  continent of b/b with mod I A  monitor b/b q shift and prn   Swallow/Nutrition/ Hydration               ADL's     bathing at shower level-steady A; LB dressing-min A; functional tranfsers-steady A; pain limitis activity; occasional low BP  independent with assistive device overall  activity tolerance, functional tranfsers, safety awareness, BADL retraining   Mobility     min guard to supervision transfers and short distance gait  mod I  LE strengthening and ROM, endurance and tolerance to standing/upright activity   Communication               Safety/Cognition/ Behavioral Observations             Pain     c/o pain to bilat LE frequently, current oxycontin 10mg  bid, percocet 2 tab q 4 hours prn, pt calls for med q 4 hours, c/o pain of 8, minimal relief  pain <2 with min A  monitor pain q shift and q 4 hours while pain remains acute.   Skin     incisions to L groin with skin glue, incisions to LLE and RLE with staples, LE edema decreasing, still 2+ Right and 1+ to left, RLE incisions with copious drainage, left with minimal drainage, dressing change with abd, kerlix and ace bandage q shift  and prn. ace bandages off at HS  maintain skin with mod I A  continue to monitor skin q shift and prn, teach dressing change to pt if he will be going home with dressing changes     *See Care Plan and  progress notes for long and short-term goals.      Barriers to Discharge   Current Status/Progress Possible Resolutions Date Resolved   Physician     Medical stability;Other (comments)  Pain control  Progressing towards goals  Continue rehab, continue medication management      Nursing                 PT  Inaccessible home environment;Decreased caregiver support;Other (comments)(pain)                 OT                 SLP            SW Decreased caregiver support Does not have 24 hr care at DC             Discharge Planning/Teaching Needs:  Home alone with family/friends checking in on him, needs to be mod/i to be able to go home safely      Team Discussion:  Making progress toward his goals of mod/i level. Endurance and activity tolerance issues. Having dressing changes BID. Limited by pain and fatigue which will take time. Knee ROM PT is working on. MD managin wounds and medications.  Revisions to Treatment Plan:  DC 8/29    Continued Need for Acute Rehabilitation Level of Care: The patient requires daily medical management by a physician with specialized training in physical medicine and rehabilitation for the following conditions: Daily direction of a multidisciplinary physical rehabilitation program to ensure safe treatment while eliciting the highest outcome that is of practical value to the patient.: Yes Daily medical management of patient stability for increased activity during participation in an intensive rehabilitation regime.: Yes Daily analysis of laboratory values and/or radiology reports with any subsequent need for medication adjustment of medical intervention for : Post surgical problems   Lucy Chris 02/02/2018, 10:24 AM               Patient ID: Michael Ehlers, male   DOB: December 31, 1960, 57 y.o.   MRN: [id number]

## 2018-02-08 NOTE — Patient Care Conference (Signed)
Inpatient RehabilitationTeam Conference and Plan of Care Update Date: 02/08/2018   Time: 11:10 AM    Patient Name: Michael [name]      Medical Record Number: [id number]  Date of Birth: 04-03-1961 Sex: Male         Room/Bed: 4U98J/1B14N-82 Payor Info: Payor: MEDICARE / Plan: MEDICARE PART A AND B / Product Type: *No Product type* /    Admitting Diagnosis: Debility  Admit Date/Time:  01/27/2018  5:36 PM Admission Comments: No comment available   Primary Diagnosis:  <principal problem not specified> Principal Problem: <principal problem not specified>  Patient Active Problem List   Diagnosis Date Noted  . Obsessive-compulsive disorder   . Muscle cramps   . Postoperative pain   . Therapeutic opioid induced constipation   . CKD (chronic kidney disease), stage II   . Acute blood loss anemia   . Debility   . Ischemia of extremity 01/23/2018  . Frequency of urination and polyuria 07/06/2017  . Erectile dysfunction due to diseases classified elsewhere 10/27/2016  . PAD (peripheral artery disease) (HCC) 08/11/2016  . Nocturia 08/11/2016  . Chronic pain of both knees 04/01/2016  . Palpitations 05/05/2015  . Hyperlipidemia 05/05/2015  . HNP (herniated nucleus pulposus), cervical 10/14/2014  . Chronic low back pain 10/14/2014  . Numbness and tingling of both upper extremities while sleeping 10/14/2014  . Frequent urination 09/03/2014  . Lower abdominal pain 09/03/2014  . Cervical stenosis of spine 05/16/2014  . Essential hypertension 05/16/2014  . Numbness and tingling of right arm 11/28/2013  . Dental abscess 04/26/2013  . Dysuria 04/26/2013  . HTN (hypertension) 04/04/2013  . CAD (coronary artery disease) 05/25/2011    Expected Discharge Date: Expected Discharge Date: 02/11/18  Team Members Present: Physician leading conference: Dr. Claudette Laws Social Worker Present: Dossie Der, LCSW Nurse Present: Regino Schultze, RN PT Present: Woodfin Ganja, PT OT Present:  Roney Mans, OT;Ardis Rowan, COTA SLP Present: Feliberto Gottron, SLP PPS Coordinator present : Tora Duck, RN, [facility]     Current Status/Progress Goal Weekly Team Focus  Medical   Patient without fever, appreciate wound follow-up from vascular surgery.  Elevated white count.  On IV antibiotic  Maintain medical stability, pain control  Discharge planning, IV antibiotics   Bowel/Bladder   continent of bowel and bladder LBM 02-06-18  remain continent of b/b  Assist with tolieting needs prn, assess bowel q shift   Swallow/Nutrition/ Hydration             ADL's   BADLs-supervision/independent with assistive device  independent with assistive device overall  activity tolerance, discharge planning, safety awareness   Mobility   supervision transfers and gait, min assist stairs  Mod I   LE strengthening and ROM, endurance and OOB tolerance, household mobility, stairs   Communication             Safety/Cognition/ Behavioral Observations            Pain   pain in bilateral lower extremity managed with oxycontin 10mg  bid, percocet 2 tab q 4h prn,  pain < or = 4  Assess pain q shift and prn   Skin   RLE staples Right foot staples minimal drainage edema improving , LLE incision healing   improving incision and no new skin issues  assess skin q shift and assess healing of incision      *See Care Plan and progress notes for long and short-term goals.     Barriers to Discharge  Current Status/Progress  Possible Resolutions Date Resolved   Physician    IV antibiotics;Decreased caregiver support;Medical stability     Progressing towards goals overall minor setback with his wound infection  May need to extend length of stay 1 to 2 days, if there is functional decline      Nursing                  PT  Inaccessible home environment;Decreased caregiver support;Other (comments);Wound Care  pain, steps to enter house w/o rails  pt planning to install rails at d/c, encouraging him to have friend do so  before d/c           OT                  SLP                SW                Discharge Planning/Teaching Needs:  Needs to be mod/i before going home due to only has intermittent assist with family and friends. Has been limited in his participation in therapies the past few days due to pain      Team Discussion:  Progressing toward his goals of mod/i-supervision level. Pain and oozing wound yesterday and Dr. Darrick PennaFields came back in to look at and give his recommendations. Staples in and will go home with due to leg too swollen to remove. Switch to po antibiotics once discharge currently on IV antibiotics. Neuro-psych seeing. Missed therapy session yesterday due to leg issues. Will need a few more days to reach the goals for home. Extended to Sat 8/31  Revisions to Treatment Plan:  Discharge extended 8/31    Continued Need for Acute Rehabilitation Level of Care: The patient requires daily medical management by a physician with specialized training in physical medicine and rehabilitation for the following conditions: Daily direction of a multidisciplinary physical rehabilitation program to ensure safe treatment while eliciting the highest outcome that is of practical value to the patient.: Yes Daily medical management of patient stability for increased activity during participation in an intensive rehabilitation regime.: Yes Daily analysis of laboratory values and/or radiology reports with any subsequent need for medication adjustment of medical intervention for : Post surgical problems;Wound care problems   I attest that I was present, lead the team conference, and concur with the assessment and plan of the team.   Lucy Chrisupree, Koua Deeg G 02/08/2018, 1:07 PM

## 2018-02-08 NOTE — Progress Notes (Signed)
Physical Therapy Session Note  Patient Details  Name: Michael [name] MRN: [id number] Date of Birth: 05/23/1961  Today's Date: 02/08/2018 PT Individual Time: 1300-1355 and 1600-1655 PT Individual Time Calculation (min): 55 min and 55 min   Short Term Goals: Week 1:  PT Short Term Goal 1 (Week 1): STGs = LTGs  Skilled Therapeutic Interventions/Progress Updates:    Session 1: Pt supine in bed upon PT arrival, agreeable to therapy tx and reports pain 1/10 in R LE. Pt transferred to sitting EOB with supervision and performed squat pivot to w/c with supervision. Pt propelled w/c to the gym using B UEs and supervision x 150 ft. Pt ambulated x80 ft with RW and supervision, decreased WB through R LE secondary to pain. Pt performed transfer on/off bed in rehab apartment, performed bed mobility with supervision. Pt ambulated to recliner x 10 ft with RW and supervision, transferred on/off recliner with supervision. Pt propelled w/c to the gym. Pt ascended/descended platform step 4 inches x 2 with RW and min assist, on the second trial pt put increased weight through R LE and pain increased to 10/10. Pt propelled to dayroom with supervision, asked RN for pain medication. Pt requesting to get back in bed. Squat pivot transfer to bed with supervision and transferred to supine with supervision. Left with needs in reach and NT present.   Session 2: Pt supine in bed upon PT agreeable to therapy tx and reports pain 5/10 in R LE, still painful from earlier session. Pt transferred to sitting EOB with supervision and performed squat pivot to w/c with supervision. Pt propelled w/c to the ortho gym using B UEs and supervision x 250 ft. Pt performed stand pivot transfer from w/c<>car with supervision, pt self keeping weight of R LE secondary to pain. Pt propelled throughout unit 2 x 200 ft with B UEs and supervision working on endurance and UE strength. Pt performed 2 x 10 LAQ, 2 x 10 hip flexion and 2 x 10 hip abduction  for LE strengthening. Therapist performed R heel cord stretch 2 x 30 sec. Pt transferred to bed with supervision and left supine with needs in reach and bed alarm set.   Therapy Documentation Precautions:  Precautions Precautions: Fall Restrictions Weight Bearing Restrictions: No   See Function Navigator for Current Functional Status.   Therapy/Group: Individual Therapy  Cresenciano GenreEmily van Schagen, PT, DPT 02/08/2018, 12:59 PM

## 2018-02-08 NOTE — Progress Notes (Signed)
Social Work Patient ID: Michael [name], male   DOB: April 02, 1961, 57 y.o.   MRN: [id number]  Met with pt and sister to inform team conference progress and needing a few extra days due to missed therapies from leg issues. He is on board with going home on Sat 8/31 and wanting to work on his step into his home. Sister reports more of them will be around on Sat and will be able to assist him. Will work toward Sat discharge.

## 2018-02-08 NOTE — Progress Notes (Signed)
Subjective/Complaints:  No issues overnite , appreciate VVS note.  No increased pain no sweats or chills  Review of systems: +right foot pain. Denies chest pain, shortness of breath, nausea, Vomiting, diarrhea  Objective: Vital Signs: Blood pressure 132/74, pulse 70, temperature 98.7 F (37.1 C), temperature source Oral, resp. rate 18, height _0  (1.727 m), weight 113.6 kg, SpO2 100 %. No results found. Results for orders placed or performed during the hospital encounter of 01/27/18 (from the past 72 hour(s))  Basic metabolic panel     Status: Abnormal   Collection Time: 02/06/18  5:08 AM  Result Value Ref Range   Sodium 135 135 - 145 mmol/L   Potassium 3.8 3.5 - 5.1 mmol/L   Chloride 99 98 - 111 mmol/L   CO2 26 22 - 32 mmol/L   Glucose, Bld 100 (H) 70 - 99 mg/dL   BUN 13 6 - 20 mg/dL   Creatinine, Ser 1.01 0.61 - 1.24 mg/dL   Calcium 8.9 8.9 - 10.3 mg/dL   GFR calc non Af Amer >60 >60 mL/min   GFR calc Af Amer >60 >60 mL/min    Comment: (NOTE) The eGFR has been calculated using the CKD EPI equation. This calculation has not been validated in all clinical situations. eGFR's persistently <60 mL/min signify possible Chronic Kidney Disease.    Anion gap 10 5 - 15    Comment: Performed at [facility] [address] [place], [place] [address]  CBC with Differential/Platelet     Status: Abnormal   Collection Time: 02/06/18  8:01 AM  Result Value Ref Range   WBC 13.3 (H) 4.0 - 10.5 K/uL   RBC 3.44 (L) 4.22 - 5.81 MIL/uL   Hemoglobin 9.3 (L) 13.0 - 17.0 g/dL   HCT 31.1 (L) 39.0 - 52.0 %   MCV 90.4 78.0 - 100.0 fL   MCH 27.0 26.0 - 34.0 pg   MCHC 29.9 (L) 30.0 - 36.0 g/dL   RDW 14.5 11.5 - 15.5 %   Platelets 399 150 - 400 K/uL   Neutrophils Relative % 74 %   Neutro Abs 9.8 (H) 1.7 - 7.7 K/uL   Lymphocytes Relative 15 %   Lymphs Abs 2.0 0.7 - 4.0 K/uL   Monocytes Relative 10 %   Monocytes Absolute 1.3 (H) 0.1 - 1.0 K/uL   Eosinophils Relative 1 %   Eosinophils  Absolute 0.2 0.0 - 0.7 K/uL   Basophils Relative 0 %   Basophils Absolute 0.0 0.0 - 0.1 K/uL   Immature Granulocytes 0 %   Abs Immature Granulocytes 0.1 0.0 - 0.1 K/uL    Comment: Performed at [facility], [place] [address] [place], [place] [address]    Constitutional: No distress . Vital signs reviewed. HENT: Normocephalic.  Atraumatic. Eyes: EOMI. No discharge. Cardiovascular: RRR. No JVD. Respiratory: CTA bilaterally. Normal effort. GI: BS +. Non-distended. Musc: right foot with edema and tenderness, unchanged Neuro: Alert/Oriented  Motor:  B/l hip flexors knee extensors 4-/5, 4/5 ankle plantar flexor dorsiflexor (right ankle limited due to pain)  Skin:   Wound Bilateral medial calf incisions with staples in place  Right dorsalis pedis incision with mild erythema along stable lines, C/D/I Left groin incision inguinal with Dermabond clean dry and intact Right groin incision with staples and serosanguineous drainage  Assessment/Plan: 1. Functional deficits secondary to peripheral vascular disease status post right popliteal and tibial embolectomy exploration of the right anterior tibial artery, left femoral to left below-knee popliteal bypass  and left popliteal and tibial endarterectomy 3/50/0938 complicated by left popliteal hematoma which require 3+ hours per day of interdisciplinary therapy in a comprehensive inpatient rehab setting. Physiatrist is providing close team supervision and 24 hour management of active medical problems listed below. Physiatrist and rehab team continue to assess barriers to discharge/monitor patient progress toward functional and medical goals. FIM: Function - Bathing Position: Shower Body parts bathed by patient: Right arm, Left arm, Chest, Abdomen, Front perineal area, Buttocks, Right upper leg, Left upper leg, Right lower leg, Left lower leg, Back Body parts bathed by helper: Right lower leg, Left lower leg, Back Assist Level: Supervision or  verbal cues  Function- Upper Body Dressing/Undressing Upper body dressing/undressing activity did not occur: Refused What is the patient wearing?: Pull over shirt/dress Pull over shirt/dress - Perfomed by patient: Thread/unthread right sleeve, Thread/unthread left sleeve, Put head through opening, Pull shirt over trunk Assist Level: No help, No cues Set up : To obtain clothing/put away Function - Lower Body Dressing/Undressing Lower body dressing/undressing activity did not occur: Refused What is the patient wearing?: Underwear, Pants, Non-skid slipper socks Position: Sitting EOB Underwear - Performed by patient: Thread/unthread right underwear leg, Thread/unthread left underwear leg, Pull underwear up/down Underwear - Performed by helper: Thread/unthread right underwear leg, Thread/unthread left underwear leg, Pull underwear up/down Pants- Performed by patient: Thread/unthread right pants leg, Thread/unthread left pants leg, Pull pants up/down Pants- Performed by helper: Thread/unthread right pants leg Non-skid slipper socks- Performed by helper: Don/doff right sock, Don/doff left sock Assist for footwear: Maximal assist Assist for lower body dressing: Supervision or verbal cues  Function - Toileting Toileting steps completed by patient: Adjust clothing prior to toileting, Performs perineal hygiene, Adjust clothing after toileting Toileting steps completed by helper: Adjust clothing prior to toileting, Performs perineal hygiene, Adjust clothing after toileting(per Haskell Flirt Aquit, NT report) Toileting Assistive Devices: Grab bar or rail Assist level: Supervision or verbal cues, More than reasonable time  Function Midwife transfer activity did not occur: Risk analyst transfer assistive device: Grab bar Assist level to toilet: Supervision or verbal cues Assist level from toilet: Supervision or verbal cues  Function - Chair/bed transfer Chair/bed transfer method:  Squat pivot Chair/bed transfer assist level: Supervision or verbal cues Chair/bed transfer assistive device: Armrests Chair/bed transfer details: Verbal cues for precautions/safety  Function - Locomotion: Wheelchair Will patient use wheelchair at discharge?: No Type: Manual Wheelchair activity did not occur: Safety/medical concerns Max wheelchair distance: 150' Assist Level: Supervision or verbal cues Wheel 50 feet with 2 turns activity did not occur: Safety/medical concerns Assist Level: Supervision or verbal cues Wheel 150 feet activity did not occur: Safety/medical concerns Assist Level: Supervision or verbal cues Function - Locomotion: Ambulation Assistive device: Walker-rolling Max distance: 100 Assist level: Supervision or verbal cues Assist level: Supervision or verbal cues Walk 50 feet with 2 turns activity did not occur: Safety/medical concerns Assist level: Supervision or verbal cues Walk 150 feet activity did not occur: Safety/medical concerns Walk 10 feet on uneven surfaces activity did not occur: Safety/medical concerns  Function - Comprehension Comprehension: Auditory Comprehension assist level: Understands complex 90% of the time/cues 10% of the time  Function - Expression Expression: Verbal Expression assist level: Expresses basic 90% of the time/requires cueing < 10% of the time.  Function - Social Interaction Social Interaction assist level: Interacts appropriately 90% of the time - Needs monitoring or encouragement for participation or interaction.  Function - Problem Solving Problem solving assist level: Solves complex  90% of the time/cues < 10% of the time  Function - Memory Memory assist level: Recognizes or recalls 90% of the time/requires cueing < 10% of the time Patient normally able to recall (first 3 days only): Current season, Location of own room, Staff names and faces, That he or she is in a hospital  Medical Problem List and  Plan: 1.Debilitysecondary to peripheral vascular disease status post right popliteal and tibial embolectomy exploration right anterior tibial artery left femoral to below-knee popliteal bypass and left popliteal and tibial endarterectomy 2/78/0044 complicated by hematoma with evacuation.Pt also with peripheral neuropathy Team conference today please see physician documentation under team conference tab, met with team face-to-face to discuss problems,progress, and goals. Formulized individual treatment plan based on medical history, underlying problem and comorbidities.    2. DVT Prophylaxis/Anticoagulation/pulmonary emboli:Xarelto, monitor for blood loss given history of hematoma left popliteal space 3. Pain Management:  Adjusted on 8/23 and again on 8/24-now on OxyContin 15 every 12 and Roxi when necessary- Reduce OxyCR to 75m  Robaxin 500 mg twice a day when necessary started on 8/25  Goal to  d/c on short acting only for 1 wk supply, pain should subside with tx of cellulitis 4. Mood:Provide emotional support 5. Neuropsych: This patientiscapable of making decisions on hisown behalf. 6. Skin/Wound Care:Routine skin checks -continue local wound care to op-sites/lower extremities 7. Fluids/Electrolytes/Nutrition:Routine in and outs 8.Acute blood loss anemia.   Hemoglobin 7.9 on 8/22  Continue to monitor 9.Renal insufficiency. Baseline creatinine 1.27-1.55  Creatinine 1.15 on 8/19,1.01 on 8/26  10.Hypertension. Monitor with increased mobility. Patient on lisinopril 20 mg daily, Lopressor 50 mg twice daily prior to admission.  - Vitals:   02/07/18 1959 02/08/18 0519  BP: 116/83 132/74  Pulse: 79 70  Resp: 20 18  Temp: 99.1 F (37.3 C) 98.7 F (37.1 C)  SpO2: 100% 100%   Controlled on 8/28 11.CAD. No chest pain or shortness of breath 12.History of alcohol use. Provide counseling, no signs of withdrawal 13.Constipation.  Laxative assistance, colace 1024mdaily increased to BID , Miralax prn, BM 8/25 14, Hypoalb prostat 15.  Superficial cellulitis RLE add Keflex, afeb, elevated WBC, area painful change to IV ancef while pt in hospital then switch to po keflex, appreciate VVS assist LOS (Days) 12 A FACE TO FACE EVALUATION WAS PERFORMED  AnCharlett Blake/28/2019, 7:57 AM

## 2018-02-08 NOTE — Discharge Summary (Signed)
Discharge summary job [id number]

## 2018-02-08 NOTE — Progress Notes (Signed)
Occupational Therapy Session Note  Patient Details  Name: Michael EhlersGary S Espejo MRN: [id number] Date of Birth: 12/28/1960  Today's Date: 02/08/2018 OT Individual Time: [id number] OT Individual Time Calculation (min): 85 min    Short Term Goals: Week 2:  OT Short Term Goal 1 (Week 2): STG = LTGs due to ELOS  Skilled Therapeutic Interventions/Progress Updates:    Pt resting in bed upon arrival and agreeable to therapy.  OT intervention with focus on functional amb with RW, standing balance, sit<>stand, activity tolerance, and safety awareness to increase independence with BADLs. Pt amb with RW to bathroom to use toilet prior to transferring to tub bench to complete shower.  Pt completed all tasks at supervision level this morning.  Pt requires more than a reasonable amount of time to complete all tasks with multiple rest breaks. Pt stood at sink to complete grooming tasks. Pt requested to return to bed for pain management.  Dorsal aspect of R foot at staples was seeping and RN came to attend to pt. Pt making steady progress towards LTGs.  Therapy Documentation Precautions:  Precautions Precautions: Fall Restrictions Weight Bearing Restrictions: No Pain: Pt c/o 6/10 pain in RLE with activity; RN aware and repositioned See Function Navigator for Current Functional Status.   Therapy/Group: Individual Therapy  Rich BraveLanier, Saliyah Gillin [name] 02/08/2018, 10:01 AM

## 2018-02-09 ENCOUNTER — Inpatient Hospital Stay (HOSPITAL_COMMUNITY): Payer: Medicare Other

## 2018-02-09 ENCOUNTER — Inpatient Hospital Stay (HOSPITAL_COMMUNITY): Payer: Medicare Other | Admitting: Occupational Therapy

## 2018-02-09 ENCOUNTER — Inpatient Hospital Stay (HOSPITAL_COMMUNITY): Payer: Medicare Other | Admitting: Physical Therapy

## 2018-02-09 ENCOUNTER — Encounter: Payer: Medicare Other | Admitting: Vascular Surgery

## 2018-02-09 NOTE — Progress Notes (Signed)
Physical Therapy Session Note  Patient Details  Name: Michael [name] MRN: [id number] Date of Birth: 05/24/1961  Today's Date: 02/09/2018 PT Individual Time: 1110-1200 PT Individual Time Calculation (min): 50 min   Short Term Goals:  Week 2:  PT Short Term Goal 1 (Week 2): =LTGs due to ELOS      Skilled Therapeutic Interventions/Progress Updates:   Make up session.  W/c propulsion on unit, with cues for efficiency on turns.  Stand pivot w/c> NuStep with supervision.  NUStep at level 3 x 8 minutes for bil LE flexiblity.  Pt reported that he is able to place R foot carefully on the foot plate and avoid stress to the incision dorsum of foot.  Seated in w/c: 30 x 1 L ankle pumps with extended knee, 5 x 1 R toe extension.  Gait training on level tile x 25', limited by R ankle/foot pain.  Upon sitting down, pt requires RLE to be elevated immediately, due to increasing pain in dependent position.    Squat pivot to return to bed.  PT assisted pt set up for lunch.  Bed alarm set and needs left within reach.    Therapy Documentation Precautions:  Precautions Precautions: Fall Restrictions Weight Bearing Restrictions: No General: PT Amount of Missed Time (min): 10 Minutes PT Missed Treatment Reason: Other (Comment)(Kayla, LPN teaching student nurses how to use bed)  Pain: Pain Assessment Pain Scale: 0-10 Pain Score: 3 at rest with RLE elevated; 7/10 during ambulation Pain Type: Acute pain Pain Location: Leg Pain Orientation: Right Pain Descriptors / Indicators: Aching Pain Onset: On-going Patients Stated Pain Goal: 4 Pain Intervention(s): Medication (See eMAR)    See Function Navigator for Current Functional Status.   Therapy/Group: Individual Therapy  Maryelizabeth Eberle 02/09/2018, 12:10 PM

## 2018-02-09 NOTE — Progress Notes (Signed)
Subjective/Complaints:  Appreciate Neuropsych note  Review of systems: +right foot pain. Denies chest pain, shortness of breath, nausea, Vomiting, diarrhea  Objective: Vital Signs: Blood pressure (!) 108/59, pulse 70, temperature 98.4 F (36.9 C), temperature source Oral, resp. rate 17, height 5\' 8"  (1.727 m), weight 113.6 kg, SpO2 100 %. No results found. Results for orders placed or performed during the hospital encounter of 01/27/18 (from the past 72 hour(s))  CBC with Differential/Platelet     Status: Abnormal   Collection Time: 02/06/18  8:01 AM  Result Value Ref Range   WBC 13.3 (H) 4.0 - 10.5 K/uL   RBC 3.44 (L) 4.22 - 5.81 MIL/uL   Hemoglobin 9.3 (L) 13.0 - 17.0 g/dL   HCT 16.1 (L) 09.6 - 04.5 %   MCV 90.4 78.0 - 100.0 fL   MCH 27.0 26.0 - 34.0 pg   MCHC 29.9 (L) 30.0 - 36.0 g/dL   RDW 40.9 81.1 - 91.4 %   Platelets 399 150 - 400 K/uL   Neutrophils Relative % 74 %   Neutro Abs 9.8 (H) 1.7 - 7.7 K/uL   Lymphocytes Relative 15 %   Lymphs Abs 2.0 0.7 - 4.0 K/uL   Monocytes Relative 10 %   Monocytes Absolute 1.3 (H) 0.1 - 1.0 K/uL   Eosinophils Relative 1 %   Eosinophils Absolute 0.2 0.0 - 0.7 K/uL   Basophils Relative 0 %   Basophils Absolute 0.0 0.0 - 0.1 K/uL   Immature Granulocytes 0 %   Abs Immature Granulocytes 0.1 0.0 - 0.1 K/uL    Comment: Performed at [facility], [address], [place], [place] [address]    Constitutional: No distress . Vital signs reviewed. HENT: Normocephalic.  Atraumatic. Eyes: EOMI. No discharge. Cardiovascular: RRR. No JVD. Respiratory: CTA bilaterally. Normal effort. GI: BS +. Non-distended. Musc: right foot with edema and tenderness, unchanged Neuro: Alert/Oriented  Motor:  B/l hip flexors knee extensors 4-/5, 4/5 ankle plantar flexor dorsiflexor (right ankle limited due to pain)  Skin:   Wound Bilateral medial calf incisions with staples in place  Right dorsalis pedis incision with mild erythema along stable lines,  C/D/I Left groin incision inguinal with Dermabond clean dry and intact Right groin incision with staples and serosanguineous drainage  Assessment/Plan: 1. Functional deficits secondary to peripheral vascular disease status post right popliteal and tibial embolectomy exploration of the right anterior tibial artery, left femoral to left below-knee popliteal bypass and left popliteal and tibial endarterectomy 01/24/2018 complicated by left popliteal hematoma which require 3+ hours per day of interdisciplinary therapy in a comprehensive inpatient rehab setting. Physiatrist is providing close team supervision and 24 hour management of active medical problems listed below. Physiatrist and rehab team continue to assess barriers to discharge/monitor patient progress toward functional and medical goals. FIM: Function - Bathing Position: Shower Body parts bathed by patient: Right arm, Left arm, Chest, Abdomen, Front perineal area, Buttocks, Right upper leg, Left upper leg, Right lower leg, Left lower leg, Back Body parts bathed by helper: Right lower leg, Left lower leg, Back Assist Level: Supervision or verbal cues  Function- Upper Body Dressing/Undressing Upper body dressing/undressing activity did not occur: Refused What is the patient wearing?: Pull over shirt/dress Pull over shirt/dress - Perfomed by patient: Thread/unthread right sleeve, Thread/unthread left sleeve, Put head through opening, Pull shirt over trunk Assist Level: No help, No cues Set up : To obtain clothing/put away Function - Lower Body Dressing/Undressing Lower body dressing/undressing activity did not occur: Refused  What is the patient wearing?: Underwear, Pants, Non-skid slipper socks Position: Wheelchair/chair at sink Underwear - Performed by patient: Thread/unthread right underwear leg, Thread/unthread left underwear leg, Pull underwear up/down Underwear - Performed by helper: Thread/unthread right underwear leg,  Thread/unthread left underwear leg, Pull underwear up/down Pants- Performed by patient: Thread/unthread right pants leg, Thread/unthread left pants leg, Pull pants up/down Pants- Performed by helper: Thread/unthread right pants leg Non-skid slipper socks- Performed by patient: Don/doff right sock, Don/doff left sock Non-skid slipper socks- Performed by helper: Don/doff right sock, Don/doff left sock Assist for footwear: Maximal assist Assist for lower body dressing: Supervision or verbal cues  Function - Toileting Toileting steps completed by patient: Adjust clothing prior to toileting, Performs perineal hygiene, Adjust clothing after toileting Toileting steps completed by helper: Adjust clothing prior to toileting, Performs perineal hygiene, Adjust clothing after toileting Toileting Assistive Devices: Grab bar or rail Assist level: Touching or steadying assistance (Pt.75%)  Function - ArchivistToilet Transfers Toilet transfer activity did not occur: Refused Toilet transfer assistive device: Walker, Elevated toilet seat/BSC over toilet, Grab bar Assist level to toilet: Supervision or verbal cues Assist level from toilet: Supervision or verbal cues  Function - Chair/bed transfer Chair/bed transfer method: Squat pivot Chair/bed transfer assist level: Supervision or verbal cues Chair/bed transfer assistive device: Armrests Chair/bed transfer details: Verbal cues for precautions/safety  Function - Locomotion: Wheelchair Will patient use wheelchair at discharge?: No Type: Manual Wheelchair activity did not occur: Safety/medical concerns Max wheelchair distance: 150' Assist Level: Supervision or verbal cues Wheel 50 feet with 2 turns activity did not occur: Safety/medical concerns Assist Level: Supervision or verbal cues Wheel 150 feet activity did not occur: Safety/medical concerns Assist Level: Supervision or verbal cues Function - Locomotion: Ambulation Assistive device: Walker-rolling Max  distance: 80 ft Assist level: Supervision or verbal cues Assist level: Supervision or verbal cues Walk 50 feet with 2 turns activity did not occur: Safety/medical concerns Assist level: Supervision or verbal cues Walk 150 feet activity did not occur: Safety/medical concerns Walk 10 feet on uneven surfaces activity did not occur: Safety/medical concerns  Function - Comprehension Comprehension: Auditory Comprehension assist level: Understands complex 90% of the time/cues 10% of the time  Function - Expression Expression: Verbal Expression assist level: Expresses basic 90% of the time/requires cueing < 10% of the time.  Function - Social Interaction Social Interaction assist level: Interacts appropriately 90% of the time - Needs monitoring or encouragement for participation or interaction.  Function - Problem Solving Problem solving assist level: Solves complex 90% of the time/cues < 10% of the time  Function - Memory Memory assist level: Recognizes or recalls 90% of the time/requires cueing < 10% of the time Patient normally able to recall (first 3 days only): Current season, Location of own room, Staff names and faces, That he or she is in a hospital  Medical Problem List and Plan: 1.Debilitysecondary to peripheral vascular disease status post right popliteal and tibial embolectomy exploration right anterior tibial artery left femoral to below-knee popliteal bypass and left popliteal and tibial endarterectomy 01/24/2018 complicated by hematoma with evacuation.Pt also with peripheral neuropathy Cont CIR, has limited participation for 1-2 d extend to 8/31    2. DVT Prophylaxis/Anticoagulation/pulmonary emboli:Xarelto, monitor for blood loss given history of hematoma left popliteal space 3. Pain Management:  Adjusted on 8/23 and again on 8/24-now on OxyContin 15 every 12 and Roxi when necessary- Reduce OxyCR to 10mg  today  Robaxin 500 mg twice a day when necessary started  on 8/25  Goal to  d/c on short acting only for 1 wk supply, pain should subside with tx of cellulitis  4. Mood:Provide emotional support 5. Neuropsych: This patientiscapable of making decisions on hisown behalf. 6. Skin/Wound Care:Routine skin checks -continue local wound care to op-sites/lower extremities 7. Fluids/Electrolytes/Nutrition:Routine in and outs 8.Acute blood loss anemia.   Hemoglobin 7.9 on 8/22  Continue to monitor 9.Renal insufficiency. Baseline creatinine 1.27-1.55  Creatinine 1.15 on 8/19,1.01 on 8/26  10.Hypertension. Monitor with increased mobility. Patient on lisinopril 20 mg daily, Lopressor 50 mg twice daily prior to admission.  - Vitals:   02/08/18 1957 02/09/18 0336  BP: 119/72 (!) 108/59  Pulse: 75 70  Resp: 14 17  Temp: 98.2 F (36.8 C) 98.4 F (36.9 C)  SpO2: 100% 100%   Controlled on 8/29 11.CAD. No chest pain or shortness of breath 12.History of alcohol use. Provide counseling, no signs of withdrawal 13.Constipation. Laxative assistance, colace 100mg  daily increased to BID , Miralax prn, BM 8/25 14, Hypoalb prostat 15.  Superficial cellulitis RLE add Keflex, afeb, elevated WBC, area painful change to IV ancef while pt in hospital then switch to po keflex, appreciate VVS assist LOS (Days) 13 A FACE TO FACE EVALUATION WAS PERFORMED  Erick Colace 02/09/2018, 7:36 AM

## 2018-02-09 NOTE — Progress Notes (Signed)
Occupational Therapy Session Note  Patient Details  Name: Michael EhlersGary S Florentino MRN: [id number] Date of Birth: 07/19/1960  Today's Date: 02/09/2018 OT Individual Time: 1330-1425 OT Individual Time Calculation (min): 55 min    Short Term Goals: Week 2:  OT Short Term Goal 1 (Week 2): STG = LTGs due to ELOS  Skilled Therapeutic Interventions/Progress Updates:    OT intervention with focus on bed mobility, functional transfers, functional amb with RW, w/c mobility, activity tolerance, and safety awareness to increase independence with BADLS.  Pt resting in bed upon arrival and requested to use toilet.  Pt amb with RW to toilet at supervison level and completed toileting tasks with supervision.  Pt propelled w/c in hallway to central elevators.  Therapist propelled w/c to main entrance for energy conservation and time constraints.  Pt propelled w/c on uneven surfaces with supervision. Pt propelled w/c back to room and transferred to bed.  Pt remained in bed with all needs within reach and bed alarm activated.  Therapy Documentation Precautions:  Precautions Precautions: Fall Restrictions Weight Bearing Restrictions: No Pain: Pain Assessment Pain Scale: 0-10 Pain Score: 4 Pain Location: Leg Pain Orientation: Right Pain Radiating Towards: ankle Pain Descriptors / Indicators: Aching;Cramping Patients Stated Pain Goal:  Pain Intervention(s): repositioned  See Function Navigator for Current Functional Status.   Therapy/Group: Individual Therapy  Rich BraveLanier, Norleen Xie [name] 02/09/2018, 2:29 PM

## 2018-02-09 NOTE — Progress Notes (Signed)
Occupational Therapy Session Note  Patient Details  Name: Michael [name] MRN: [id number] Date of Birth: 06/04/1961  Today's Date: 02/09/2018 OT Individual Time: 0700-0825 OT Individual Time Calculation (min): 85 min    Short Term Goals: Week 2:  OT Short Term Goal 1 (Week 2): STG = LTGs due to ELOS  Skilled Therapeutic Interventions/Progress Updates:    Pt resting in bed upon arrival and agreeable to therapy.  Pt declined shower and completed bathing/dressing tasks with sit<>stand from w/c at sink.  Pt stood at sink to complete grooming tasks.  Pt completed simple home mgmt tasks from w/c level, straightening up room.  Pt gathered soiled clothing and propelled to laundry room.  Pt stood at washer to place clothing in washer.  Pt amb with RW to day room and engaged in BUE therex on Sci[employer] (RAndom load 5 for 8 mins). Pt returned to room and transferred to bed.  Pt remained in bed with all needs within reach.   Therapy Documentation Precautions:  Precautions Precautions: Fall Restrictions Weight Bearing Restrictions: No   Pain: Pain Assessment Pain Scale: 0-10 Pain Score: 6 RLE RN aware and repositioned  See Function Navigator for Current Functional Status.   Therapy/Group: Individual Therapy  Michael [name], Michael [name] 02/09/2018, 8:31 AM

## 2018-02-09 NOTE — Progress Notes (Signed)
Occupational Therapy Discharge Summary  Patient Details  Name: Michael [name] MRN: [id number] Date of Birth: 02/07/1961  Today's Date: 02/10/2018 OT Individual Time: 0700-0828 OT Individual Time Calculation (min): 88 min    Pt agreeable to tx with pain 3/10. RN aware and pt willing to do therapy to tolerance. Pt completes stand pivot transfer EOB>w/c with MOD I and propels w/c into bathroom with MOD I. Pt transfers onto toilet with supervision/VC to lock w/c brake prior to transfer. Pt completes toileting with MOD I. Pt completes bathing and dressing at w/c level at sink with MOD I gathering clothes from w/c level prior to dressing at sit to stand at sink. Pt grooms seated at seated at sink with MOD I. Pt propels w/c toADL apartment with MOD I. Pt completes ambualtory transfers from w/c>TTB>recliner>supine on bed>couch >w/c with MOD I in prep for d/c home. Pt educated on use of shower curtain to tuck under buttocks when seated on TTB in order to keep water from bathroom floor at home. Pt also educated on non slip tread stickers to increase safety when in tub. Exited session with pt seated in bed, exit alarm on, call light in reach and all needs met  Patient has met 9 of 9 long term goals due to improved activity tolerance, improved balance, ability to compensate for deficits and improved coordination.  Patient to discharge at overall Modified Independent level. Pt continues to be limited by pain in RLE and limits putting weight onto R foot increasing challenges with balance.   Reasons goals not met: n/a  Recommendation:  Patient will benefit from ongoing skilled OT services in home health setting to continue to advance functional skills in the area of BADL and iADL.  Equipment: tub transfer bench  Reasons for discharge: treatment goals met  Patient/family agrees with progress made and goals achieved: Yes  OT Discharge Vision Baseline Vision/History: Wears glasses Wears Glasses: Reading  only Patient Visual Report: No change from baseline Vision Assessment?: No apparent visual deficits Perception  Perception: Within Functional Limits Praxis Praxis: Intact Cognition Overall Cognitive Status: Within Functional Limits for tasks assessed Arousal/Alertness: Awake/alert Orientation Level: Oriented X4 Attention: Alternating Alternating Attention: Appears intact Memory: Appears intact Awareness: Appears intact Problem Solving: Appears intact Safety/Judgment: Appears intact Sensation Sensation Light Touch: Appears Intact Hot/Cold: Appears Intact Proprioception: Appears Intact Stereognosis: Not tested Coordination Gross Motor Movements are Fluid and Coordinated: Yes Fine Motor Movements are Fluid and Coordinated: Yes Motor  Motor Motor: Within Functional Limits Mobility  Bed Mobility Bed Mobility: Supine to Sit;Sit to Supine Supine to Sit: Independent with assistive device Sit to Supine: Independent with assistive device Transfers Sit to Stand: Independent with assistive device Stand to Sit: Independent with assistive device  Trunk/Postural Assessment  Cervical Assessment Cervical Assessment: Within Functional Limits Thoracic Assessment Thoracic Assessment: Within Functional Limits Lumbar Assessment Lumbar Assessment: Within Functional Limits Postural Control Postural Control: Within Functional Limits  Balance Static Sitting Balance Static Sitting - Level of Assistance: 7: Independent Dynamic Sitting Balance Sitting balance - Comments: mod I Extremity/Trunk Assessment RUE Assessment RUE Assessment: Within Functional Limits LUE Assessment LUE Assessment: Within Functional Limits   See Function Navigator for Current Functional Status.  Leotis Shames [facility] 02/09/2018, 3:30 PM

## 2018-02-09 NOTE — Progress Notes (Signed)
Occupational Therapy Session Note  Patient Details  Name: Michael [name] MRN: [id number] Date of Birth: [birth year]  Today's Date: 02/09/2018 OT Individual Time: 1000-1043 OT Individual Time Calculation (min): 43 min   Short Term Goals: Week 2:  OT Short Term Goal 1 (Week 2): STG = LTGs due to ELOS  Skilled Therapeutic Interventions/Progress Updates:    Pt greeted semi-reclined in bed and agreeable to OT treatment session. OT set-up wc, then pt able to completed squat-pivot without assistance.  B UE strength/coordination with wc propulsion to therapy apartment.  Pt educated on wc positioning to access fridge, microwave, and pantry, kitchen modifications, and energy conservation strategies. Pt then able to demonstrate carryover of education during simulated meal prep task at wc level. Pt returned to room and left seated in wc with nurse tech present and needs met.    Therapy Documentation Precautions:  Precautions Precautions: Fall Restrictions Weight Bearing Restrictions: No Pain: Pain Assessment Pain Scale: 0-10 Pain Score: 5 Pain Type: Acute pain Pain Location: Leg Pain Orientation: Right Pain Descriptors / Indicators: Aching Pain Onset: On-going Patients Stated Pain Goal: 4 Pain Intervention(s): Repositioned  See Function Navigator for Current Functional Status.  Therapy/Group: Individual Therapy  Valma Cava 02/09/2018, 10:44 AM

## 2018-02-09 NOTE — Progress Notes (Signed)
Physical Therapy Session Note  Patient Details  Name: Michael [name] MRN: [id number] Date of Birth: [birth year]  Today's Date: 02/09/2018 PT Individual Time: 1440-1530 PT Individual Time Calculation (min): 50 min   Short Term Goals: Week 1:  PT Short Term Goal 1 (Week 1): STGs = LTGs  Skilled Therapeutic Interventions/Progress Updates:   Pt in supine and agreeable to therapy, denies pain at rest. RLE pain in standing, but resolves w/ rest. Session focused on endurance w/ functional mobility and stair training. Pt self-propelled w/c around unit w/ supervision using BUEs for endurance training. Practiced negotiating curb w/ RW going up backwards and down forwards, close supervision and verbal cues for technique. Pt states this is much easier than going forwards on the way up the curb. Problem solved carrying objects around home. Educated on importance of using walker bag for small items and carrying larger items on his lap when in w/c. Ambulated 25' x2 in household environment while picking up objects, maintained standing balance while reaching for objects w/ supervision. Discussed energy conservation strategies including taking regular seated rest breaks and placing objects to prop feet up when he is not in the w/c for pain relief. Pt verbalized understanding and in agreement. Ended session in recliner, call bell within reach and all needs met.   Therapy Documentation Precautions:  Precautions Precautions: Fall Restrictions Weight Bearing Restrictions: No Vital Signs: Therapy Vitals Temp: 98.9 F (37.2 C) Temp Source: Oral Pulse Rate: 75 Resp: 19 BP: 93/73 Patient Position (if appropriate): Lying Oxygen Therapy SpO2: 100 % O2 Device: Room Air   See Function Navigator for Current Functional Status.   Therapy/Group: Individual Therapy  Fatimata Talsma K Arnette 02/09/2018, 3:32 PM

## 2018-02-09 NOTE — Plan of Care (Signed)
  Problem: Consults Goal: RH GENERAL PATIENT EDUCATION Description See Patient Education module for education specifics. Outcome: Progressing Goal: Skin Care Protocol Initiated - if Braden Score 18 or less Description If consults are not indicated, leave blank or document N/A Outcome: Progressing   Problem: RH SKIN INTEGRITY Goal: RH STG MAINTAIN SKIN INTEGRITY WITH ASSISTANCE Description STG Maintain Skin Integrity With mod I Assistance.  Outcome: Progressing Goal: RH STG ABLE TO PERFORM INCISION/WOUND CARE W/ASSISTANCE Description STG Able To Perform Incision/Wound Care With mod Assistance.  Outcome: Progressing   Problem: RH SAFETY Goal: RH STG ADHERE TO SAFETY PRECAUTIONS W/ASSISTANCE/DEVICE Description STG Adhere to Safety Precautions With mod I  Assistance/Device.  Outcome: Progressing Goal: RH STG DECREASED RISK OF FALL WITH ASSISTANCE Description STG Decreased Risk of Fall With mod I Assistance.  Outcome: Progressing   Problem: RH PAIN MANAGEMENT Goal: RH STG PAIN MANAGED AT OR BELOW PT'S PAIN GOAL Description < 4  With min A  Outcome: Progressing   Problem: RH KNOWLEDGE DEFICIT GENERAL Goal: RH STG INCREASE KNOWLEDGE OF SELF CARE AFTER HOSPITALIZATION Description Pt will be able to direct care after discharge with Min assist  From family  Outcome: Progressing

## 2018-02-09 NOTE — Discharge Instructions (Signed)
Inpatient Rehab Discharge Instructions  [facility]barann Ehlers[facility] S [facility] Discharge date and time: No discharge date for patient encounter.   Activities/Precautions/ Functional Status: Activity: activity as tolerated Diet: regular diet Wound Care: keep wound clean and dry Functional status:  ___ No restrictions     ___ Walk up steps independently ___ 24/7 supervision/assistance   ___ Walk up steps with assistance ___ Intermittent supervision/assistance  ___ Bathe/dress independently ___ Walk with walker     _x__ Bathe/dress with assistance ___ Walk Independently    ___ Shower independently ___ Walk with assistance    ___ Shower with assistance ___ No alcohol     ___ Return to work/school ________  Special Instructions: No driving, smoking or alcohol   COMMUNITY REFERRALS UPON DISCHARGE:    Home Health:   PT, OT, RN  Agency:[facility] Phone:[phone]   Date of last service:02/11/2018  Medical Equipment/Items Ordered:WHEELCHAIR & TUB BENCH  Agency/Supplier:[facility]   [phone]  Other:HAS APPLIED FOR MEDICAID AND FOOD STAMPS WHILE HOSPITALIZED    My questions have been answered and I understand these instructions. I will adhere to these goals and the provided educational materials after my discharge from the hospital.  Patient/Caregiver Signature _______________________________ Date __________  Clinician Signature _______________________________________ Date __________  Please bring this form and your medication list with you to all your follow-up doctor's appointments. Information on my medicine - XARELTO (rivaroxaban)  This medication education was reviewed with me or my healthcare representative as part of my discharge preparation.    WHY WAS XARELTO PRESCRIBED FOR YOU? 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  What do you need to know about Xarelto? The starting dose is one 15 mg tablet taken TWICE daily with food for the FIRST 21 DAYS then on  02/18/18  the dose is changed to one 20 mg tablet taken ONCE A DAY with your evening meal.  DO NOT stop taking Xarelto without talking to the health care provider who prescribed the medication.  Refill your prescription for 20 mg tablets before you run out.  After discharge, you should have regular check-up appointments with your healthcare provider that is prescribing your Xarelto.  In the future your dose may need to be changed if your kidney function changes by a significant amount.  What do you do if you miss a dose? If you are taking Xarelto TWICE DAILY and you miss a dose, take it as soon as you remember. You may take two 15 mg tablets (total 30 mg) at the same time then resume your regularly scheduled 15 mg twice daily the next day.  If you are taking Xarelto ONCE DAILY and you miss a dose, take it as soon as you remember on the same day then continue your regularly scheduled once daily regimen the next day. Do not take two doses of Xarelto at the same time.   Important Safety Information Xarelto is a blood thinner medicine that can cause bleeding. You should call your healthcare provider right away if you experience any of the following: ? Bleeding from an injury or your nose that does not stop. ? Unusual colored urine (red or dark brown) or unusual colored stools (red or black). ? Unusual bruising for unknown reasons. ? A serious fall or if you hit your head (even if there is no bleeding).  Some medicines may interact with Xarelto and might increase your risk of bleeding while on Xarelto. To help avoid this, consult your healthcare provider or pharmacist prior to using any new prescription or non-prescription medications, including herbals, vitamins, non-steroidal anti-inflammatory drugs (NSAIDs) and supplements.  This  website has more information on Xarelto: [email or web address]

## 2018-02-10 ENCOUNTER — Inpatient Hospital Stay (HOSPITAL_COMMUNITY): Payer: Medicare Other

## 2018-02-10 ENCOUNTER — Inpatient Hospital Stay (HOSPITAL_COMMUNITY): Payer: Medicare Other | Admitting: Occupational Therapy

## 2018-02-10 ENCOUNTER — Inpatient Hospital Stay (HOSPITAL_COMMUNITY): Payer: Medicare Other | Admitting: Physical Therapy

## 2018-02-10 MED ORDER — RIVAROXABAN 15 MG PO TABS: ORAL_TABLET | ORAL | 0 refills | Status: DC

## 2018-02-10 MED ORDER — OXYCODONE HCL ER 15 MG PO T12A: 15.0000 mg | EXTENDED_RELEASE_TABLET | Freq: Two times a day (BID) | ORAL | 0 refills | Status: DC

## 2018-02-10 MED ORDER — DOCUSATE SODIUM 100 MG PO CAPS: 100.0000 mg | ORAL_CAPSULE | Freq: Two times a day (BID) | ORAL | 0 refills | Status: DC

## 2018-02-10 MED ORDER — METOPROLOL TARTRATE 25 MG PO TABS: 25.0000 mg | ORAL_TABLET | Freq: Two times a day (BID) | ORAL | 1 refills | Status: DC

## 2018-02-10 MED ORDER — CEPHALEXIN 250 MG PO CAPS: 250.0000 mg | ORAL_CAPSULE | Freq: Four times a day (QID) | ORAL | 0 refills | Status: AC

## 2018-02-10 MED ORDER — METHOCARBAMOL 500 MG PO TABS: 500.0000 mg | ORAL_TABLET | Freq: Two times a day (BID) | ORAL | 0 refills | Status: DC | PRN

## 2018-02-10 MED ORDER — PANTOPRAZOLE SODIUM 40 MG PO TBEC: 40.0000 mg | DELAYED_RELEASE_TABLET | Freq: Every day | ORAL | 0 refills | Status: DC

## 2018-02-10 MED ORDER — RIVAROXABAN 20 MG PO TABS: ORAL_TABLET | ORAL | 1 refills | Status: DC

## 2018-02-10 MED ORDER — ROSUVASTATIN CALCIUM 40 MG PO TABS: 40.0000 mg | ORAL_TABLET | Freq: Every day | ORAL | 1 refills | Status: DC

## 2018-02-10 MED ORDER — LISINOPRIL 10 MG PO TABS: 10.0000 mg | ORAL_TABLET | Freq: Every day | ORAL | 1 refills | Status: DC

## 2018-02-10 MED ORDER — OXYCODONE HCL ER 10 MG PO T12A
10.0000 mg | EXTENDED_RELEASE_TABLET | Freq: Two times a day (BID) | ORAL | Status: DC
  Administered 2018-02-10 – 2018-02-11 (×2): 10 mg via ORAL
  Filled 2018-02-10 (×2): qty 1

## 2018-02-10 MED ORDER — OXYCODONE-ACETAMINOPHEN 5-325 MG PO TABS: 1.0000 | ORAL_TABLET | ORAL | 0 refills | Status: DC | PRN

## 2018-02-10 MED ORDER — ACETAMINOPHEN 325 MG PO TABS: 325.0000 mg | ORAL_TABLET | ORAL | Status: DC | PRN

## 2018-02-10 NOTE — Progress Notes (Signed)
Physical Therapy Discharge Summary  Patient Details  Name: Michael [name] MRN: [id number] Date of Birth: Dec 14, 1960  Today's Date: 02/10/2018 PT Individual Time: 0900-0950 PT Individual Time Calculation (min): 50 min    Pt in supine and agreeable to therapy, pain 5/10 at rest. Session focused on functional mobility and d/c planning. Pt performed all mobility including bed mobility, gait, transfers, and car transfer at independent or modified independent level. Pt continues to require supervision when negotiating stairs to enter the home for safety. Pt also independent w/ w/c parts management and demonstrates good safety awareness w/ locking and unlocking brakes. Performed NuStep 5 min x2 reps at level 1 for LE strengthening and ROM, pt remains very tight in R ankle 2/2 pain and edema. Returned to room and pt requesting to lay down prior to next session at [place] session in supine, call bell within reach and all needs met.   Patient has met 5 of 8 long term goals due to improved activity tolerance, improved balance, increased strength, increased range of motion, decreased pain and ability to compensate for deficits. Patient to discharge at an ambulatory level Modified Independent.  Pt will also use w/c intermittently for energy conservation w/ household mobility. Patient's care partner unavailable to provide the necessary physical assistance at discharge. He understands importance of having someone there during stair negotiation for safety and plans to arrange this when he needs to enter or exit the home.   Reasons goals not met: Pt continues to require supervision for stair negotiation for safety. Additionally, his gait is limited in distance by RLE pain. He remains a household ambulator at this time.   Recommendation:  Patient will benefit from ongoing skilled PT services in home health setting to continue to advance safe functional mobility, address ongoing impairments in endurance, LE  strength and ROM, and functional balance, and minimize fall risk.  Equipment: RW and w/c  Reasons for discharge: treatment goals met and discharge from hospital  Patient/family agrees with progress made and goals achieved: Yes  PT Discharge Precautions/Restrictions Precautions Precautions: Fall Precaution Comments: L LE JP drain Restrictions Weight Bearing Restrictions: No Vital Signs Therapy Vitals Temp: 98.6 F (37 C) Pulse Rate: 74 Resp: 18 BP: 105/64 Patient Position (if appropriate): Sitting Oxygen Therapy SpO2: 100 % O2 Device: Room Air Pain Pain Assessment Pain Scale: 0-10 Pain Score: 7  Pain Type: Acute pain Pain Location: Ankle Pain Orientation: Right Pain Descriptors / Indicators: Aching Pain Onset: On-going Patients Stated Pain Goal: 4 Pain Intervention(s): Medication (See eMAR) Vision/Perception  Perception Perception: Within Functional Limits Praxis Praxis: Intact  Cognition Overall Cognitive Status: Within Functional Limits for tasks assessed Arousal/Alertness: Awake/alert Orientation Level: Oriented X4 Safety/Judgment: Appears intact Sensation Sensation Light Touch: Impaired by gross assessment Additional Comments: Reports impaired RLE in stocking distribution  Coordination Gross Motor Movements are Fluid and Coordinated: Yes Fine Motor Movements are Fluid and Coordinated: Yes Motor  Motor Motor: Within Functional Limits  Mobility Bed Mobility Bed Mobility: Supine to Sit;Sit to Supine;Rolling Right;Rolling Left Rolling Right: Independent Rolling Left: Independent Supine to Sit: Independent Sit to Supine: Independent Transfers Transfers: Sit to Stand;Stand to Sit;Stand Pivot Transfers Sit to Stand: Independent with assistive device Stand to Sit: Independent with assistive device Stand Pivot Transfers: Independent with assistive device Transfer (Assistive device): Rolling walker Locomotion  Gait Ambulation: Yes Gait Assistance:  Independent with assistive device Gait Distance (Feet): 36 Feet Assistive device: Rolling walker Gait Gait velocity: decreased 2/2 pain Stairs / Additional Locomotion Stairs: Yes  Stairs Assistance: Independent with assistive device Stair Management Technique: No rails;With walker Number of Stairs: 2 Height of Stairs: 3 Wheelchair Mobility Wheelchair Mobility: Yes Wheelchair Assistance: Independent with Camera operator: Both upper extremities Wheelchair Parts Management: Independent Distance: 150'  Trunk/Postural Assessment  Cervical Assessment Cervical Assessment: Within Functional Limits Thoracic Assessment Thoracic Assessment: Within Functional Limits Lumbar Assessment Lumbar Assessment: Within Functional Limits Postural Control Postural Control: Within Functional Limits  Balance Balance Balance Assessed: Yes Static Sitting Balance Static Sitting - Balance Support: Feet supported;Bilateral upper extremity supported Static Sitting - Level of Assistance: 7: Independent Dynamic Sitting Balance Sitting balance - Comments: mod I Static Standing Balance Static Standing - Balance Support: During functional activity Static Standing - Level of Assistance: 6: Modified independent (Device/Increase time) Dynamic Standing Balance Dynamic Standing - Balance Support: During functional activity;No upper extremity supported Dynamic Standing - Level of Assistance: 6: Modified independent (Device/Increase time) Extremity Assessment  RLE Assessment RLE Assessment: Exceptions to [facility] RLE AROM (degrees) Overall AROM Right Lower Extremity: Deficits;Due to pain RLE Overall AROM Comments: Ankle imited 2/2 edema, knee and hip WFL RLE Strength RLE Overall Strength: Deficits;Due to pain RLE Overall Strength Comments: 3+ to 4/5 globally LLE Assessment LLE Assessment: Within Functional Limits   See Function Navigator for Current Functional Status.  Truda Staub K  Arnette 02/10/2018, 9:51 AM

## 2018-02-10 NOTE — Progress Notes (Signed)
Subjective/Complaints:  Ankle pain a little better  Review of systems: +right foot pain. Denies chest pain, shortness of breath, nausea, Vomiting, diarrhea  Objective: Vital Signs: Blood pressure (!) 104/53, pulse 67, temperature 98.6 F (37 C), resp. rate 18, height 5\' 8"  (1.727 m), weight 113.6 kg, SpO2 100 %. No results found. No results found for this or any previous visit (from the past 72 hour(s)).  Constitutional: No distress . Vital signs reviewed. HENT: Normocephalic.  Atraumatic. Eyes: EOMI. No discharge. Cardiovascular: RRR. No JVD. Respiratory: CTA bilaterally. Normal effort. GI: BS +. Non-distended. Musc: right foot with edema and tenderness, unchanged Neuro: Alert/Oriented  Motor:  B/l hip flexors knee extensors 4-/5, 4/5 ankle plantar flexor dorsiflexor (right ankle limited due to pain)  Skin:   Wound Bilateral medial calf incisions with staples in place  Right dorsalis pedis incision with mild erythema along stable lines, C/D/I Left groin incision inguinal with Dermabond clean dry and intact Right groin incision with staples and serosanguineous drainage  Assessment/Plan: 1. Functional deficits secondary to peripheral vascular disease status post right popliteal and tibial embolectomy exploration of the right anterior tibial artery, left femoral to left below-knee popliteal bypass and left popliteal and tibial endarterectomy 01/24/2018 complicated by left popliteal hematoma which require 3+ hours per day of interdisciplinary therapy in a comprehensive inpatient rehab setting. Physiatrist is providing close team supervision and 24 hour management of active medical problems listed below. Physiatrist and rehab team continue to assess barriers to discharge/monitor patient progress toward functional and medical goals. FIM: Function - Bathing Position: Wheelchair/chair at sink Body parts bathed by patient: Right arm, Left arm, Chest, Abdomen, Front perineal area, Buttocks,  Right upper leg, Left upper leg, Right lower leg, Left lower leg, Back Body parts bathed by helper: Right lower leg, Left lower leg, Back Assist Level: Supervision or verbal cues  Function- Upper Body Dressing/Undressing Upper body dressing/undressing activity did not occur: Refused What is the patient wearing?: Pull over shirt/dress Pull over shirt/dress - Perfomed by patient: Thread/unthread right sleeve, Thread/unthread left sleeve, Put head through opening, Pull shirt over trunk Assist Level: No help, No cues Set up : To obtain clothing/put away Function - Lower Body Dressing/Undressing Lower body dressing/undressing activity did not occur: Refused What is the patient wearing?: Underwear, Pants, Non-skid slipper socks Position: Wheelchair/chair at sink Underwear - Performed by patient: Thread/unthread right underwear leg, Thread/unthread left underwear leg, Pull underwear up/down Underwear - Performed by helper: Thread/unthread right underwear leg, Thread/unthread left underwear leg, Pull underwear up/down Pants- Performed by patient: Thread/unthread right pants leg, Thread/unthread left pants leg, Pull pants up/down Pants- Performed by helper: Thread/unthread right pants leg Non-skid slipper socks- Performed by patient: Don/doff right sock, Don/doff left sock Non-skid slipper socks- Performed by helper: Don/doff right sock, Don/doff left sock Assist for footwear: Maximal assist Assist for lower body dressing: Supervision or verbal cues  Function - Toileting Toileting steps completed by patient: Adjust clothing prior to toileting, Performs perineal hygiene, Adjust clothing after toileting Toileting steps completed by helper: Adjust clothing prior to toileting, Performs perineal hygiene, Adjust clothing after toileting Toileting Assistive Devices: Grab bar or rail Assist level: Touching or steadying assistance (Pt.75%)  Function - Archivist transfer activity did not  occur: Refused Toilet transfer assistive device: Elevated toilet seat/BSC over toilet, Grab bar Assist level to toilet: Supervision or verbal cues Assist level from toilet: Supervision or verbal cues  Function - Chair/bed transfer Chair/bed transfer method: Squat pivot Chair/bed transfer assist level: Supervision or  verbal cues Chair/bed transfer assistive device: Armrests Chair/bed transfer details: Verbal cues for precautions/safety  Function - Locomotion: Wheelchair Will patient use wheelchair at discharge?: Yes Type: Manual Wheelchair activity did not occur: Safety/medical concerns Max wheelchair distance: 150' Assist Level: Supervision or verbal cues Wheel 50 feet with 2 turns activity did not occur: Safety/medical concerns Assist Level: Supervision or verbal cues Wheel 150 feet activity did not occur: Safety/medical concerns Assist Level: Supervision or verbal cues Turns around,maneuvers to table,bed, and toilet,negotiates 3% grade,maneuvers on rugs and over doorsills: No Function - Locomotion: Ambulation Assistive device: Walker-rolling Max distance: 25' Assist level: Supervision or verbal cues Assist level: Supervision or verbal cues Walk 50 feet with 2 turns activity did not occur: Safety/medical concerns Assist level: Supervision or verbal cues Walk 150 feet activity did not occur: Safety/medical concerns Walk 10 feet on uneven surfaces activity did not occur: Safety/medical concerns  Function - Comprehension Comprehension: Auditory Comprehension assist level: Follows complex conversation/direction with no assist  Function - Expression Expression: Verbal Expression assist level: Expresses complex ideas: With no assist  Function - Social Interaction Social Interaction assist level: Interacts appropriately with others - No medications needed.  Function - Problem Solving Problem solving assist level: Solves complex problems: Recognizes & self-corrects  Function -  Memory Memory assist level: Recognizes or recalls 90% of the time/requires cueing < 10% of the time Patient normally able to recall (first 3 days only): Current season, Location of own room, Staff names and faces, That he or she is in a hospital  Medical Problem List and Plan: 1.Debilitysecondary to peripheral vascular disease status post right popliteal and tibial embolectomy exploration right anterior tibial artery left femoral to below-knee popliteal bypass and left popliteal and tibial endarterectomy 01/24/2018 complicated by hematoma with evacuation.Pt also with peripheral neuropathy Plan D/C for am    2. DVT Prophylaxis/Anticoagulation/pulmonary emboli:Xarelto, monitor for blood loss given history of hematoma left popliteal space 3. Pain Management:  Adjusted on 8/23 and again on 8/24-now on OxyContin 15 every 12 and Roxi when necessary- Reduce OxyCR to 10mg  8/30,   Robaxin 500 mg twice a day when necessary started on 8/25  Goal to  d/c on short acting only for 1 wk supply, pain should subside with tx of cellulitis   4. Mood:Provide emotional support 5. Neuropsych: This patientiscapable of making decisions on hisown behalf. 6. Skin/Wound Care:Routine skin checks -continue local wound care to op-sites/lower extremities 7. Fluids/Electrolytes/Nutrition:Routine in and outs 8.Acute blood loss anemia.   Hemoglobin 7.9 on 8/22  Continue to monitor 9.Renal insufficiency. Baseline creatinine 1.27-1.55  Creatinine 1.15 on 8/19,1.01 on 8/26  10.Hypertension. Monitor with increased mobility. Patient on lisinopril 20 mg daily, Lopressor 50 mg twice daily prior to admission.  - Vitals:   02/10/18 0435 02/10/18 0558  BP: 127/64 (!) 104/53  Pulse: 72 67  Resp: 18 18  Temp: 98.5 F (36.9 C) 98.6 F (37 C)  SpO2: 100% 100%   Controlled on 8/30  11.CAD. No chest pain or shortness of breath 12.History of alcohol use. Provide  counseling, no signs of withdrawal 13.Constipation. Laxative assistance, colace 100mg  daily increased to BID , Miralax prn, 14, Hypoalb prostat 15.  Superficial cellulitis RLE  afeb, elevated WBC, area painful change to IV ancef while pt in hospital then switch to po keflex for D/C, appreciate VVS assist LOS (Days) 14 A FACE TO FACE EVALUATION WAS PERFORMED  Michael [name] E Michael [name] 02/10/2018, 7:25 AM

## 2018-02-10 NOTE — Progress Notes (Signed)
DC summary complete by Malissa Hippoan A, PA. Pt has packet and prescriptions. No concerns noted.   Ross LudwigKAYLA M Asahd Can, LPN

## 2018-02-10 NOTE — Plan of Care (Signed)
  Problem: RH PAIN MANAGEMENT Goal: RH STG PAIN MANAGED AT OR BELOW PT'S PAIN GOAL Description < 4  With min A  Outcome: Progressing  Administered prn pain regimen as needed.

## 2018-02-10 NOTE — Progress Notes (Signed)
Social Work  Discharge Note  The overall goal for the admission was met for: DC-SAT 8/31  Discharge location: Yes-HOME WITH INTERMITTENT ASSIST FROM FAMILY AND FRIENDS  Length of Stay: Yes-15 DAYS  Discharge activity level: Yes-MOD/I LEVEL  Home/community participation: Yes  Services provided included: MD, RD, PT, OT, RN, CM, TR, Pharmacy, Neuropsych and SW  Financial Services: Medicare  Follow-up services arranged: Home Health: [facility]-PT,OT,RN, DME: [facility] and Patient/Family has no preference for HH/DME agencies  Comments (or additional information):FAILY AND FRIENDS TO [place]. WILL NOT HAVE 24 HR CARE. HAS APPLIED FOR MEDICAID AND FOOD STAMPS WHILE HERE  Patient/Family verbalized understanding of follow-up arrangements: Yes  Individual responsible for coordination of the follow-up plan: SELF & JOSAPHINE-SISTER  Confirmed correct DME delivered: Elease Hashimoto 02/10/2018    Elease Hashimoto

## 2018-02-10 NOTE — Progress Notes (Signed)
Occupational Therapy Session Note  Patient Details  Name: KDYN VONBEHREN MRN: [id number] Date of Birth: 1960/09/22  Today's Date: 02/10/2018 OT Individual Time: 1000-1100 OT Individual Time Calculation (min): 60 min    Short Term Goals: Week 1:  OT Short Term Goal 1 (Week 1): STG = LTGs due to ELOS OT Short Term Goal 1 - Progress (Week 1): Progressing toward goal Week 2:  OT Short Term Goal 1 (Week 2): STG = LTGs due to ELOS  Skilled Therapeutic Interventions/Progress Updates:    Pt seen this session to work on activity tolerance with transfers and ambulation.  Pt practiced with  RW to transfer from bed to W/c with mod I.  He self propelled w.c to gym hallway.  Pt stood with mod I to RW and was able to take steps with LLE only as he could not tolerate bearing wt on RLE for 36 feet.  Pt was then in significant pain.  R leg elevated and pt rested for awhile. He self propelled w/c around unit to work on UE strength.  Pt's new w/c arrived to room but pt did not want to try to sit in it, he felt he needed to return to bed.  Pt returned to bed with mod I.  Pt's leg rests adjusted on new w/c to match length of leg rests on the fleet w/c he has been using. Pt in room with all needs met.  Therapy Documentation Precautions:  Precautions Precautions: Fall Precaution Comments: L LE JP drain Restrictions Weight Bearing Restrictions: No     Pain: Pain Assessment Pain Scale: 0-10 Pain Score: 7  Pain Type: Acute pain Pain Location: Ankle Pain Orientation: Right  RESTED, elevated Leg  Vision Baseline Vision/History: Wears glasses Wears Glasses: Reading only Patient Visual Report: No change from baseline Vision Assessment?: No apparent visual deficits Perception  Perception: Within Functional Limits Praxis Praxis: Intact  See Function Navigator for Current Functional Status.   Therapy/Group: Individual Therapy  [place] 02/10/2018, 1:02 PM

## 2018-02-10 NOTE — Plan of Care (Signed)
  Problem: Consults Goal: RH GENERAL PATIENT EDUCATION Description See Patient Education module for education specifics. Outcome: Progressing Goal: Skin Care Protocol Initiated - if Braden Score 18 or less Description If consults are not indicated, leave blank or document N/A Outcome: Progressing   Problem: RH SKIN INTEGRITY Goal: RH STG SKIN FREE OF INFECTION/BREAKDOWN Description Patients skin will remain free from further infection or breakdown with mod I assist.  Outcome: Progressing Goal: RH STG MAINTAIN SKIN INTEGRITY WITH ASSISTANCE Description STG Maintain Skin Integrity With mod I Assistance.  Outcome: Progressing Goal: RH STG ABLE TO PERFORM INCISION/WOUND CARE W/ASSISTANCE Description STG Able To Perform Incision/Wound Care With mod Assistance.  Outcome: Progressing   Problem: RH SAFETY Goal: RH STG ADHERE TO SAFETY PRECAUTIONS W/ASSISTANCE/DEVICE Description STG Adhere to Safety Precautions With mod I  Assistance/Device.  Outcome: Progressing Goal: RH STG DECREASED RISK OF FALL WITH ASSISTANCE Description STG Decreased Risk of Fall With mod I Assistance.  Outcome: Progressing   Problem: RH PAIN MANAGEMENT Goal: RH STG PAIN MANAGED AT OR BELOW PT'S PAIN GOAL Description < 4  With min A  Outcome: Progressing   Problem: RH KNOWLEDGE DEFICIT GENERAL Goal: RH STG INCREASE KNOWLEDGE OF SELF CARE AFTER HOSPITALIZATION Description Pt will be able to direct care after discharge with Min assist  From family  Outcome: Progressing   Problem: RH BOWEL ELIMINATION Goal: RH STG MANAGE BOWEL W/MEDICATION W/ASSISTANCE Description Pt will have bm q 2-3 days with min A   Outcome: Progressing

## 2018-02-11 NOTE — Progress Notes (Signed)
Subjective/Complaints:  Patient in good spirits.  Excited about getting home today. ROS: Patient denies fever, rash, sore throat, blurred vision, nausea, vomiting, diarrhea, cough, shortness of breath or chest pain, joint or back pain, headache, or mood change.   Objective: Vital Signs: Blood pressure 121/79, pulse 68, temperature 98.2 F (36.8 C), resp. rate 18, height 5\' 8"  (1.727 m), weight 113.6 kg, SpO2 100 %. No results found. No results found for this or any previous visit (from the past 72 hour(s)).  Constitutional: No distress . Vital signs reviewed. HEENT: EOMI, oral membranes moist Neck: supple Cardiovascular: RRR without murmur. No JVD    Respiratory: CTA Bilaterally without wheezes or rales. Normal effort    GI: BS +, non-tender, non-distended  Musc: right foot with edema and tenderness, wound dressed Neuro: Alert/Oriented  Motor:  B/l hip flexors knee extensors 4-/5, 4/5 ankle plantar flexor dorsiflexor (right ankle limited due to pain)  Skin:   Wound Bilateral medial calf incisions with staples in place dry Right dorsalis pedis incision with mild erythema along stable lines, C/D/I    Assessment/Plan: 1. Functional deficits secondary to peripheral vascular disease status post right popliteal and tibial embolectomy exploration of the right anterior tibial artery, left femoral to left below-knee popliteal bypass and left popliteal and tibial endarterectomy 01/24/2018 complicated by left popliteal hematoma which require 3+ hours per day of interdisciplinary therapy in a comprehensive inpatient rehab setting. Physiatrist is providing close team supervision and 24 hour management of active medical problems listed below. Physiatrist and rehab team continue to assess barriers to discharge/monitor patient progress toward functional and medical goals. FIM: Function - Bathing Position: Wheelchair/chair at sink Body parts bathed by patient: Right arm, Left arm, Chest, Abdomen,  Front perineal area, Buttocks, Right upper leg, Left upper leg, Right lower leg, Left lower leg, Back Body parts bathed by helper: Right lower leg, Left lower leg, Back Assist Level: Supervision or verbal cues  Function- Upper Body Dressing/Undressing Upper body dressing/undressing activity did not occur: Refused What is the patient wearing?: Pull over shirt/dress Pull over shirt/dress - Perfomed by patient: Thread/unthread right sleeve, Thread/unthread left sleeve, Put head through opening, Pull shirt over trunk Assist Level: No help, No cues Set up : To obtain clothing/put away Function - Lower Body Dressing/Undressing Lower body dressing/undressing activity did not occur: Refused What is the patient wearing?: Underwear, Pants, Non-skid slipper socks Position: Wheelchair/chair at sink Underwear - Performed by patient: Thread/unthread right underwear leg, Thread/unthread left underwear leg, Pull underwear up/down Underwear - Performed by helper: Thread/unthread right underwear leg, Thread/unthread left underwear leg, Pull underwear up/down Pants- Performed by patient: Thread/unthread right pants leg, Thread/unthread left pants leg, Pull pants up/down Pants- Performed by helper: Thread/unthread right pants leg Non-skid slipper socks- Performed by patient: Don/doff right sock, Don/doff left sock Non-skid slipper socks- Performed by helper: Don/doff right sock, Don/doff left sock Assist for footwear: Maximal assist Assist for lower body dressing: More than reasonable time  Function - Toileting Toileting steps completed by patient: Adjust clothing prior to toileting, Performs perineal hygiene, Adjust clothing after toileting Toileting steps completed by helper: Adjust clothing prior to toileting, Performs perineal hygiene, Adjust clothing after toileting Toileting Assistive Devices: Grab bar or rail Assist level: More than reasonable time  Function - Archivist transfer  activity did not occur: Refused Toilet transfer assistive device: Elevated toilet seat/BSC over toilet, Grab bar Assist level to toilet: No Help, no cues, assistive device, takes more than a reasonable amount of time Assist  level from toilet: No Help, no cues, assistive device, takes more than a reasonable amount of time  Function - Chair/bed transfer Chair/bed transfer method: Stand pivot Chair/bed transfer assist level: No Help, no cues, assistive device, takes more than a reasonable amount of time Chair/bed transfer assistive device: Armrests Chair/bed transfer details: Verbal cues for precautions/safety  Function - Locomotion: Wheelchair Will patient use wheelchair at discharge?: Yes Type: Manual Wheelchair activity did not occur: Safety/medical concerns Max wheelchair distance: 150' Assist Level: No help, No cues, assistive device, takes more than reasonable amount of time Wheel 50 feet with 2 turns activity did not occur: Safety/medical concerns Assist Level: No help, No cues, assistive device, takes more than reasonable amount of time Wheel 150 feet activity did not occur: Safety/medical concerns Assist Level: No help, No cues, assistive device, takes more than reasonable amount of time Turns around,maneuvers to table,bed, and toilet,negotiates 3% grade,maneuvers on rugs and over doorsills: Yes Function - Locomotion: Ambulation Assistive device: Walker-rolling Max distance: 36.5 '(pt with increased RLE pain and could not go further) Assist level: No help, No cues, assistive device, takes more than a reasonable amount of time Assist level: No help, No cues, assistive device, takes more than a reasonable amount of time Walk 50 feet with 2 turns activity did not occur: Safety/medical concerns Assist level: Supervision or verbal cues Walk 150 feet activity did not occur: Safety/medical concerns Walk 10 feet on uneven surfaces activity did not occur: Safety/medical  concerns  Function - Comprehension Comprehension: Auditory Comprehension assist level: Follows complex conversation/direction with no assist  Function - Expression Expression: Verbal Expression assist level: Expresses complex ideas: With no assist  Function - Social Interaction Social Interaction assist level: Interacts appropriately with others - No medications needed.  Function - Problem Solving Problem solving assist level: Solves complex problems: Recognizes & self-corrects  Function - Memory Memory assist level: Complete Independence: No helper Patient normally able to recall (first 3 days only): Current season, Location of own room, Staff names and faces, That he or she is in a hospital  Medical Problem List and Plan: 1.Debilitysecondary to peripheral vascular disease status post right popliteal and tibial embolectomy exploration right anterior tibial artery left femoral to below-knee popliteal bypass and left popliteal and tibial endarterectomy 01/24/2018 complicated by hematoma with evacuation.Pt also with peripheral neuropathy Discharge home today.  Patient reviewed discharge instructions with physician assistant and no further questions today.    2. DVT Prophylaxis/Anticoagulation/pulmonary emboli:Xarelto, monitor for blood loss given history of hematoma left popliteal space 3. Pain Management:  Adjusted on 8/23 and again on 8/24-now on OxyContin 15 every 12 and Roxi when necessary- Reduced OxyCR to 10mg  8/30,   Robaxin 500 mg twice a day when necessary started on 8/25     4. Mood:Provide emotional support 5. Neuropsych: This patientiscapable of making decisions on hisown behalf. 6. Skin/Wound Care:Routine skin checks -continue local wound care to op-sites/lower extremities  -Remove staples on bilateral calfs 7. Fluids/Electrolytes/Nutrition:Routine in and outs 8.Acute blood loss anemia.   Hemoglobin 7.9 on 8/22  Continue to  monitor 9.Renal insufficiency. Baseline creatinine 1.27-1.55  Creatinine 1.15 on 8/19,1.01 on 8/26  10.Hypertension. Monitor with increased mobility. Patient on lisinopril 20 mg daily, Lopressor 50 mg twice daily prior to admission.  - Vitals:   02/10/18 1930 02/11/18 0541  BP: 128/65 121/79  Pulse: 83 68  Resp: 18 18  Temp: 98.2 F (36.8 C) 98.2 F (36.8 C)  SpO2: 100% 100%   Controlled on 8/31 11.CAD.  No chest pain or shortness of breath 12.History of alcohol use. Provide counseling, no signs of withdrawal 13.Constipation. Laxative assistance, colace 100mg  daily increased to BID , Miralax prn, 14, Hypoalb prostat 15.  Superficial cellulitis RLE  afeb, elevated WBC, area painful change to IV ancef while pt in hospital then switched to po keflex for D/C, appreciate VVS assist   LOS (Days) 15 A FACE TO FACE EVALUATION WAS PERFORMED  Ranelle Oyster 02/11/2018, 8:28 AM

## 2018-02-11 NOTE — Progress Notes (Signed)
Patient given discharge instructions and RX by Deatra Inaan Angiulli, PA> Patient voiced understanding. Patient released with family and personal belongings. Patient was wheeled down in wheel chair by nurse tech. Lorri FrederickMartha E Hanner, LPN

## 2018-02-15 ENCOUNTER — Ambulatory Visit: Payer: Medicare Other | Admitting: Cardiology

## 2018-02-15 ENCOUNTER — Encounter

## 2018-02-15 DIAGNOSIS — R0989 Other specified symptoms and signs involving the circulatory and respiratory systems: Secondary | ICD-10-CM

## 2018-02-15 NOTE — Progress Notes (Deleted)
Cardiology Office Note:    Date:  02/15/2018   ID:  Michael [name], DOB 03-05-1961, MRN [id number]  PCP:  Quentin Angst, MD  Cardiologist:  Verne Carrow, MD  Referring MD: Quentin Angst, MD   No chief complaint on file. ***  History of Present Illness:    Michael [name] is a 57 y.o. male with a past medical history significant for CAD s/p STEMI treated with DES to LAD 03/2011.  He had diffuse distal disease in the right PLA and the third OM was occluded and filled from left to left collaterals.  He had severe dye reaction.  LV function was preserved by echo.  He also has hypertension, alcohol use,  and PAD.   On 01/23/18 Mr. Michael [name] presented to the ED with c/o 2-3 days of worsening numbness/tingling of the right first toe extending up the right leg.  He was found to have a right leg DVT with most likely chronic underlying arterial occlusive disease. He was seen by vascualr surgery and on 01/23/18 he underwent BYPASS GRAFT LEFT FEMORAL-POPLITEAL ARTERY, ENDARTERECTOMY OF LEFT POPLITEAL ARTERY AND TIBIAL-PERONEAL TRUNK, RIGHT POPLITEAL-TIBIAL  ARTERY EMBOLECTOMY, EXPLORATION OF RIGHT ANTERIOR TIBIAL ARTERY, RIGHT POPLITEAL ARTERY VEIN ANGIOPLASTY. He subsequently developed a large hematoma in the left popliteal space.  Duplex studies revealed patent bypass grafts.  He was taken back to the OR for evacuation of the hematoma and placement of JP drain.  The patient was initially on heparin but transitioned to Xarelto prior to discharge to inpatient rehab.   Mr. Michael [name] is here today for cardiology follow up        Past Medical History:  Diagnosis Date  . Alcohol abuse   . Anterior myocardial infarction [facility])    ST-elevation; S/P emergent  drug-eluting stenting of proximal left anterior descending  . Coronary artery disease    a. STEMI 2012 - s/p DES to LAD with  diffuse distal disease in the right posterolateral branch. The third OM was occluded and filled  from left to left collaterals. LVEF preserved.  . Dysuria 04/26/2013  . Essential hypertension 05/16/2014  . Frequent urination 09/03/2014  . HNP (herniated nucleus pulposus), cervical 10/14/2014  . Hyperlipidemia   . Hypertension   . Ischemia of extremity 01/23/2018  . Lower abdominal pain 09/03/2014  . PAD (peripheral artery disease) (HCC) 08/11/2016  . PAD (peripheral artery disease) (HCC) 08/11/2016  . Pre-diabetes   . Renal insufficiency   . Special screening for malignant neoplasms, colon     Past Surgical History:  Procedure Laterality Date  . COLONOSCOPY N/A 08/16/2014   Procedure: COLONOSCOPY;  Surgeon: West Bali, MD;  [facility];  Service: Endoscopy;  Laterality: N/A;  10:15 AM  . DOPPLER ECHOCARDIOGRAPHY     Preserved left venticular function  . EMBOLECTOMY Right 01/23/2018   Procedure: RIGHT POPLITEAL-TIBIAL  ARTERY EMBOLECTOMY, EXPLORATION OF RIGHT ANTERIOR TIBIAL ARTERY, RIGHT POPLITEAL ARTERY VEIN ANGIOPLASTY;  Surgeon: Sherren Kerns, MD;  Location: [facility] OR;  Service: Vascular;  Laterality: Right;  . ENDARTERECTOMY POPLITEAL Left 01/23/2018   Procedure: ENDARTERECTOMY OF LEFT POPLITEAL ARTERY AND TIBIAL-PERONEAL TRUNK;  Surgeon: Sherren Kerns, MD;  [facility];  Service: Vascular;  Laterality: Left;  . FEMORAL-POPLITEAL BYPASS GRAFT Left 01/23/2018   Procedure: BYPASS GRAFT LEFT FEMORAL-POPLITEAL ARTERY WITH PROPATEN VASCULAR GRAFT;  Surgeon: Sherren Kerns, MD;  Location: [facility] OR;  Service: Vascular;  Laterality: Left;  . HEMATOMA EVACUATION Left 01/25/2018   Procedure: EVACUATION HEMATOMA  LEFT POPLITEAL SPACE;  Surgeon: Cephus Shelling, MD;  Location: [facility] OR;  Service: Vascular;  Laterality: Left;  . LEG SURGERY     left leg - has rod and 4 pins  . PATELLAR TENDON REPAIR Right 04/26/2014   Procedure: RIGHT PATELLA TENDON REPAIR;  Surgeon: Sheral Apley, MD;  Location: [facility];  Service: Orthopedics;  Laterality: Right;     Current Medications: No outpatient medications have been marked as taking for the 02/15/18 encounter (Appointment) with [name], NP.     Allergies:   Ivp dye [iodinated diagnostic agents]   Social History   Socioeconomic History  . Marital status: Significant Other    Spouse name: Not on file  . Number of children: 0  . Years of education: Not on file  . Highest education level: Not on file  Occupational History  . Not on file  Social Needs  . Financial resource strain: Not on file  . Food insecurity:    Worry: Not on file    Inability: Not on file  . Transportation needs:    Medical: Not on file    Non-medical: Not on file  Tobacco Use  . Smoking status: Never Smoker  . Smokeless tobacco: Never Used  Substance and Sexual Activity  . Alcohol use: Yes    Comment: Drinks occassionally  . Drug use: No  . Sexual activity: Not on file  Lifestyle  . Physical activity:    Days per week: Not on file    Minutes per session: Not on file  . Stress: Not on file  Relationships  . Social connections:    Talks on phone: Not on file    Gets together: Not on file    Attends religious service: Not on file    Active member of club or organization: Not on file    Attends meetings of clubs or organizations: Not on file    Relationship status: Not on file  Other Topics Concern  . Not on file  Social History Narrative  . Not on file     Family History: The patient's family history includes Cancer in his maternal aunt; Diabetes in his unknown relative; Heart disease in his unknown relative; Hyperlipidemia in his unknown relative. ROS:   Please see the history of present illness.     All other systems reviewed and are negative.  EKGs/Labs/Other Studies Reviewed:    The following studies were reviewed today:  Cardiac cath 03/31/11: 1. Right coronary artery: The vessel is large in size and dominant.  There is a mild 20% disease in the mid segment with minor luminal   irregularities distally. The PDA is normal in size and free of  significant disease. First posterolateral branch is normal in size  with 40% proximal disease. The second posterolateral branch is  relatively large in size with 50% disease at the ostium followed by  a 70% lesion proximally and diffuse 80% disease distally. The RCA  gives collaterals to the left anterior descending artery as well as  OM-3.  2. Left main coronary artery: The vessel is normal in size without  significant disease.  3. Left anterior descending artery: The vessel is normal in size and  is occluded proximally a small septal branch. The distal LAD does  not have significant disease. The first diagonal branch is normal  to large in size and has 2 branches. The superior branch is  overall normal. The inferior branch has a 90% proximal disease.  4. Left circumflex artery: The vessel is normal in size and  nondominant. The first obtuse marginal is normal in size with a  mild proximal disease. The second OM is normal in size with no  significant disease. Third OM appears to be occluded with  collaterals supplied from the distal LAD and the right coronary  Artery.   EKG:  EKG is *** ordered today.  The ekg ordered today demonstrates ***  Recent Labs: 01/30/2018: ALT 100 02/06/2018: BUN 13; Creatinine, Ser 1.01; Hemoglobin 9.3; Platelets 399; Potassium 3.8; Sodium 135   Recent Lipid Panel    Component Value Date/Time   CHOL 133 08/11/2016 1030   TRIG 96 08/11/2016 1030   HDL 37 (L) 08/11/2016 1030   CHOLHDL 3.6 08/11/2016 1030   VLDL 19 08/11/2016 1030   LDLCALC 77 08/11/2016 1030   LDLDIRECT 164.1 05/04/2013 1107    Physical Exam:    VS:  There were no vitals taken for this visit.    Wt Readings from Last 3 Encounters:  01/27/18 250 lb 7.1 oz (113.6 kg)  01/24/18 214 lb 15.2 oz (97.5 kg)  07/19/17 219 lb (99.3 kg)     Physical Exam***   ASSESSMENT:    No diagnosis found. PLAN:    In order  of problems listed above:  CAD: s/p STEMI 2012 with DES to LAD.   Hypertension: treated with lisinopril 10 mg daily, lopressor 25 mg bid,   Hyperlipidemia: on Rosuvastatin 40 mg daily. LDL on 08/11/16 was 77. Near goal of <70. Will update lipid panel.   PAD: Presented with right leg DVT in setting of arterial occlusion.  S/P left fem-pop bypass and left popliteal and tibial endarterectomy, 01/23/18, complicated by popliteal space hematoma. Now on Xarelto 20 mg daily.   Anemia: Hgb down to 8 in setting of peripheral artery surgery and susequent hematoma. Hgb 9.3 on 02/06/18 appears to have stabilized.   Medication Adjustments/Labs and Tests Ordered: Current medicines are reviewed at length with the patient today.  Concerns regarding medicines are outlined above. Labs and tests ordered and medication changes are outlined in the patient instructions below:  There are no Patient Instructions on file for this visit.   Signed, [name], NP  02/15/2018 1:13 PM    [facility]

## 2018-02-16 ENCOUNTER — Encounter: Payer: Self-pay | Admitting: Cardiology

## 2018-02-23 ENCOUNTER — Inpatient Hospital Stay: Payer: Medicare Other | Admitting: Family Medicine

## 2018-02-23 ENCOUNTER — Encounter: Payer: Medicare Other | Admitting: Vascular Surgery

## 2018-02-27 ENCOUNTER — Ambulatory Visit: Payer: Medicare Other | Admitting: Nurse Practitioner

## 2018-03-02 ENCOUNTER — Other Ambulatory Visit: Payer: Self-pay

## 2018-03-02 ENCOUNTER — Emergency Department (HOSPITAL_COMMUNITY): Payer: Medicare Other

## 2018-03-02 ENCOUNTER — Encounter (HOSPITAL_COMMUNITY): Payer: Self-pay | Admitting: Emergency Medicine

## 2018-03-02 ENCOUNTER — Encounter: Payer: Self-pay | Admitting: Vascular Surgery

## 2018-03-02 ENCOUNTER — Emergency Department (HOSPITAL_COMMUNITY)
Admission: EM | Admit: 2018-03-02 | Discharge: 2018-03-03 | Disposition: A | Discharge status: Home / Self Care | Payer: Medicare Other | Attending: Emergency Medicine | Admitting: Emergency Medicine

## 2018-03-02 ENCOUNTER — Encounter: Payer: Medicare Other | Admitting: Vascular Surgery

## 2018-03-02 DIAGNOSIS — R52 Pain, unspecified: Secondary | ICD-10-CM | POA: Diagnosis not present

## 2018-03-02 DIAGNOSIS — L03115 Cellulitis of right lower limb: Secondary | ICD-10-CM | POA: Diagnosis not present

## 2018-03-02 DIAGNOSIS — R609 Edema, unspecified: Secondary | ICD-10-CM | POA: Diagnosis not present

## 2018-03-02 DIAGNOSIS — I2510 Atherosclerotic heart disease of native coronary artery without angina pectoris: Secondary | ICD-10-CM | POA: Insufficient documentation

## 2018-03-02 DIAGNOSIS — I129 Hypertensive chronic kidney disease with stage 1 through stage 4 chronic kidney disease, or unspecified chronic kidney disease: Secondary | ICD-10-CM | POA: Diagnosis not present

## 2018-03-02 DIAGNOSIS — M79604 Pain in right leg: Secondary | ICD-10-CM | POA: Diagnosis present

## 2018-03-02 DIAGNOSIS — Z7901 Long term (current) use of anticoagulants: Secondary | ICD-10-CM | POA: Diagnosis not present

## 2018-03-02 DIAGNOSIS — Z79899 Other long term (current) drug therapy: Secondary | ICD-10-CM | POA: Insufficient documentation

## 2018-03-02 DIAGNOSIS — N182 Chronic kidney disease, stage 2 (mild): Secondary | ICD-10-CM | POA: Insufficient documentation

## 2018-03-02 DIAGNOSIS — I252 Old myocardial infarction: Secondary | ICD-10-CM | POA: Diagnosis not present

## 2018-03-02 DIAGNOSIS — M7989 Other specified soft tissue disorders: Secondary | ICD-10-CM | POA: Diagnosis not present

## 2018-03-02 DIAGNOSIS — E785 Hyperlipidemia, unspecified: Secondary | ICD-10-CM | POA: Diagnosis not present

## 2018-03-02 LAB — CBC WITH DIFFERENTIAL/PLATELET
ABS IMMATURE GRANULOCYTES: 0 10*3/uL (ref 0.0–0.1)
BASOS ABS: 0 10*3/uL (ref 0.0–0.1)
Basophils Relative: 0 %
Eosinophils Absolute: 0.2 10*3/uL (ref 0.0–0.7)
Eosinophils Relative: 3 %
HCT: 35.7 % — ABNORMAL LOW (ref 39.0–52.0)
HEMOGLOBIN: 10.6 g/dL — AB (ref 13.0–17.0)
IMMATURE GRANULOCYTES: 0 %
LYMPHS PCT: 22 %
Lymphs Abs: 1.6 10*3/uL (ref 0.7–4.0)
MCH: 26.8 pg (ref 26.0–34.0)
MCHC: 29.7 g/dL — AB (ref 30.0–36.0)
MCV: 90.2 fL (ref 78.0–100.0)
Monocytes Absolute: 1 10*3/uL (ref 0.1–1.0)
Monocytes Relative: 13 %
NEUTROS PCT: 62 %
Neutro Abs: 4.5 10*3/uL (ref 1.7–7.7)
Platelets: 240 10*3/uL (ref 150–400)
RBC: 3.96 MIL/uL — AB (ref 4.22–5.81)
RDW: 15.3 % (ref 11.5–15.5)
WBC: 7.3 10*3/uL (ref 4.0–10.5)

## 2018-03-02 LAB — COMPREHENSIVE METABOLIC PANEL
ALK PHOS: 117 U/L (ref 38–126)
ALT: 17 U/L (ref 0–44)
AST: 19 U/L (ref 15–41)
Albumin: 3.2 g/dL — ABNORMAL LOW (ref 3.5–5.0)
Anion gap: 8 (ref 5–15)
BILIRUBIN TOTAL: 0.6 mg/dL (ref 0.3–1.2)
BUN: 16 mg/dL (ref 6–20)
CO2: 23 mmol/L (ref 22–32)
Calcium: 8.8 mg/dL — ABNORMAL LOW (ref 8.9–10.3)
Chloride: 107 mmol/L (ref 98–111)
Creatinine, Ser: 1.35 mg/dL — ABNORMAL HIGH (ref 0.61–1.24)
GFR calc Af Amer: >60 mL/min (ref >60)
GFR, EST NON AFRICAN AMERICAN: 57 mL/min — AB (ref >60)
Glucose, Bld: 105 mg/dL — ABNORMAL HIGH (ref 70–99)
POTASSIUM: 4 mmol/L (ref 3.5–5.1)
Sodium: 138 mmol/L (ref 135–145)
TOTAL PROTEIN: 6.6 g/dL (ref 6.5–8.1)

## 2018-03-02 LAB — I-STAT CG4 LACTIC ACID, ED: LACTIC ACID, VENOUS: 0.92 mmol/L (ref 0.5–1.9)

## 2018-03-02 MED ORDER — OXYCODONE-ACETAMINOPHEN 5-325 MG PO TABS
2.0000 | ORAL_TABLET | Freq: Once | ORAL | Status: AC
  Administered 2018-03-02: 2 via ORAL
  Filled 2018-03-02: qty 2

## 2018-03-02 MED ORDER — DOXYCYCLINE HYCLATE 100 MG PO TABS: 100.0000 mg | ORAL_TABLET | Freq: Once | ORAL | Status: DC

## 2018-03-02 MED ORDER — MORPHINE SULFATE (PF) 4 MG/ML IV SOLN
4.0000 mg | Freq: Once | INTRAVENOUS | Status: AC
  Administered 2018-03-02: 4 mg via INTRAVENOUS
  Filled 2018-03-02: qty 1

## 2018-03-02 MED ORDER — DOXYCYCLINE HYCLATE 100 MG PO CAPS: 100.0000 mg | ORAL_CAPSULE | Freq: Two times a day (BID) | ORAL | 0 refills | Status: DC

## 2018-03-02 NOTE — ED Provider Notes (Signed)
[facility] EMERGENCY DEPARTMENT Provider Note   CSN: [id number] Arrival date & time: 03/02/18  1823     History   Chief Complaint Chief Complaint  Patient presents with  . Post-op Problem    HPI Michael [name] is a 57 y.o. male.  57 year old male with past mental history including CAD, PVD, hypertension, hyperlipidemia, prediabetes, CKD who presents with leg pain.  The patient had bilateral lower extremity bypass surgery and endarterectomy approximately 1 month ago.  He states that over the past few days he has been having worsening swelling and redness of his right leg.  His pain is currently severe and constant.  He does have some pain in his left leg but pain is worse in the right leg.  He was due to follow-up in the clinic tomorrow to have staples removed.  He denies any fevers.  He is compliant with his medications including Xarelto.  The history is provided by the patient.    Past Medical History:  Diagnosis Date  . Alcohol abuse   . Anterior myocardial infarction [facility])    ST-elevation; S/P emergent  drug-eluting stenting of proximal left anterior descending  . Coronary artery disease    a. STEMI 2012 - s/p DES to LAD with  diffuse distal disease in the right posterolateral branch. The third OM was occluded and filled from left to left collaterals. LVEF preserved.  . Dysuria 04/26/2013  . Essential hypertension 05/16/2014  . Frequent urination 09/03/2014  . HNP (herniated nucleus pulposus), cervical 10/14/2014  . Hyperlipidemia   . Hypertension   . Ischemia of extremity 01/23/2018  . Lower abdominal pain 09/03/2014  . PAD (peripheral artery disease) (HCC) 08/11/2016  . PAD (peripheral artery disease) (HCC) 08/11/2016  . Pre-diabetes   . Renal insufficiency   . Special screening for malignant neoplasms, colon     Patient Active Problem List   Diagnosis Date Noted  . Obsessive-compulsive disorder   . Muscle cramps   . Postoperative pain   .  Therapeutic opioid induced constipation   . CKD (chronic kidney disease), stage II   . Acute blood loss anemia   . Debility   . Ischemia of extremity 01/23/2018  . Frequency of urination and polyuria 07/06/2017  . Erectile dysfunction due to diseases classified elsewhere 10/27/2016  . PAD (peripheral artery disease) (HCC) 08/11/2016  . Nocturia 08/11/2016  . Chronic pain of both knees 04/01/2016  . Palpitations 05/05/2015  . Hyperlipidemia 05/05/2015  . HNP (herniated nucleus pulposus), cervical 10/14/2014  . Chronic low back pain 10/14/2014  . Numbness and tingling of both upper extremities while sleeping 10/14/2014  . Frequent urination 09/03/2014  . Lower abdominal pain 09/03/2014  . Cervical stenosis of spine 05/16/2014  . Essential hypertension 05/16/2014  . Numbness and tingling of right arm 11/28/2013  . Dental abscess 04/26/2013  . Dysuria 04/26/2013  . HTN (hypertension) 04/04/2013  . CAD (coronary artery disease) 05/25/2011    Past Surgical History:  Procedure Laterality Date  . COLONOSCOPY N/A 08/16/2014   Procedure: COLONOSCOPY;  Surgeon: West Bali, MD;  [facility];  Service: Endoscopy;  Laterality: N/A;  10:15 AM  . DOPPLER ECHOCARDIOGRAPHY     Preserved left venticular function  . EMBOLECTOMY Right 01/23/2018   Procedure: RIGHT POPLITEAL-TIBIAL  ARTERY EMBOLECTOMY, EXPLORATION OF RIGHT ANTERIOR TIBIAL ARTERY, RIGHT POPLITEAL ARTERY VEIN ANGIOPLASTY;  Surgeon: Sherren Kerns, MD;  Location: [facility] OR;  Service: Vascular;  Laterality: Right;  . ENDARTERECTOMY POPLITEAL Left  01/23/2018   Procedure: ENDARTERECTOMY OF LEFT POPLITEAL ARTERY AND TIBIAL-PERONEAL TRUNK;  Surgeon: Sherren Kerns, MD;  Location: [facility] OR;  Service: Vascular;  Laterality: Left;  . FEMORAL-POPLITEAL BYPASS GRAFT Left 01/23/2018   Procedure: BYPASS GRAFT LEFT FEMORAL-POPLITEAL ARTERY WITH PROPATEN VASCULAR GRAFT;  Surgeon: Sherren Kerns, MD;  Location: [facility] OR;  Service: Vascular;   Laterality: Left;  . HEMATOMA EVACUATION Left 01/25/2018   Procedure: EVACUATION HEMATOMA LEFT POPLITEAL SPACE;  Surgeon: Cephus Shelling, MD;  Location: [facility] OR;  Service: Vascular;  Laterality: Left;  . LEG SURGERY     left leg - has rod and 4 pins  . PATELLAR TENDON REPAIR Right 04/26/2014   Procedure: RIGHT PATELLA TENDON REPAIR;  Surgeon: Sheral Apley, MD;  Location: [facility];  Service: Orthopedics;  Laterality: Right;        Home Medications    Prior to Admission medications   Medication Sig Start Date End Date Taking? Authorizing Provider  acetaminophen (TYLENOL) 325 MG tablet Take 1-2 tablets (325-650 mg total) by mouth every 4 (four) hours as needed for mild pain (headache, or temp >/= 101 F). 02/10/18  Yes Angiulli, Mcarthur Rossetti, PA-C  lisinopril (PRINIVIL,ZESTRIL) 10 MG tablet Take 1 tablet (10 mg total) by mouth daily. 02/10/18  Yes Angiulli, Mcarthur Rossetti, PA-C  methocarbamol (ROBAXIN) 500 MG tablet Take 1 tablet (500 mg total) by mouth 2 (two) times daily as needed for muscle spasms. 02/10/18  Yes Angiulli, Mcarthur Rossetti, PA-C  metoprolol tartrate (LOPRESSOR) 25 MG tablet Take 1 tablet (25 mg total) by mouth 2 (two) times daily. 02/10/18  Yes Angiulli, Mcarthur Rossetti, PA-C  oxyCODONE-acetaminophen (PERCOCET/ROXICET) 5-325 MG tablet Take 1-2 tablets by mouth every 4 (four) hours as needed for moderate pain. 02/10/18  Yes Angiulli, Mcarthur Rossetti, PA-C  rivaroxaban (XARELTO) 20 MG TABS tablet Begin 1 tablet daily 02/18/2018 Patient taking differently: Take 20 mg by mouth daily. Begin 1 tablet daily 02/18/2018 02/10/18  Yes Angiulli, Mcarthur Rossetti, PA-C  rosuvastatin (CRESTOR) 40 MG tablet Take 1 tablet (40 mg total) by mouth daily. 02/10/18  Yes Angiulli, Mcarthur Rossetti, PA-C  docusate sodium (COLACE) 100 MG capsule Take 1 capsule (100 mg total) by mouth 2 (two) times daily. 02/10/18   Angiulli, Mcarthur Rossetti, PA-C  doxycycline (VIBRAMYCIN) 100 MG capsule Take 1 capsule (100 mg total) by mouth 2 (two) times  daily. 03/02/18   Little, Ambrose Finland, MD  nitroGLYCERIN (NITROSTAT) 0.4 MG SL tablet Place 1 tablet (0.4 mg total) under the tongue every 5 (five) minutes as needed for chest pain. 01/02/18   Kathleene Hazel, MD  oxyCODONE (OXYCONTIN) 15 mg 12 hr tablet Take 1 tablet (15 mg total) by mouth every 12 (twelve) hours. 02/10/18   Angiulli, Mcarthur Rossetti, PA-C  pantoprazole (PROTONIX) 40 MG tablet Take 1 tablet (40 mg total) by mouth daily. 02/10/18   Angiulli, Mcarthur Rossetti, PA-C  Rivaroxaban (XARELTO) 15 MG TABS tablet 1 tablet twice daily x6 days and stop 02/10/18   Angiulli, Mcarthur Rossetti, PA-C    Family History Family History  Problem Relation Age of Onset  . Cancer Maternal Aunt   . Hyperlipidemia Unknown   . Diabetes Unknown   . Heart disease Unknown     Social History Social History   Tobacco Use  . Smoking status: Never Smoker  . Smokeless tobacco: Never Used  Substance Use Topics  . Alcohol use: Yes    Comment: Drinks occassionally  . Drug use: No  Allergies   Ivp dye [iodinated diagnostic agents]   Review of Systems Review of Systems All other systems reviewed and are negative except that which was mentioned in HPI   Physical Exam Updated Vital Signs BP (!) 144/85   Pulse 74   Wt 113 kg   SpO2 100%   BMI 37.88 kg/m   Physical Exam  Constitutional: He is oriented to person, place, and time. He appears well-developed and well-nourished. He appears distressed.  In distress due to pain  HENT:  Head: Normocephalic and atraumatic.  Moist mucous membranes  Eyes: Conjunctivae are normal.  Neck: Neck supple.  Cardiovascular: Normal rate, regular rhythm and normal heart sounds.  No murmur heard. Pulmonary/Chest: Effort normal and breath sounds normal.  Abdominal: Soft. Bowel sounds are normal. He exhibits no distension. There is no tenderness.  Musculoskeletal: He exhibits edema and tenderness.  B/l LE pitting edema R>L; R lower leg erythematous and warm; crusted  drainage over staples on R distal anterior lower leg near ankle; diffuse tenderness to palpation; compartments soft  Neurological: He is alert and oriented to person, place, and time.  Fluent speech  Skin: Skin is warm and dry.  Psychiatric:  distressed  Nursing note and vitals reviewed.        ED Treatments / Results  Labs (all labs ordered are listed, but only abnormal results are displayed) Labs Reviewed  COMPREHENSIVE METABOLIC PANEL - Abnormal; Notable for the following components:      Result Value   Glucose, Bld 105 (*)    Creatinine, Ser 1.35 (*)    Calcium 8.8 (*)    Albumin 3.2 (*)    GFR calc non Af Amer 57 (*)    All other components within normal limits  CBC WITH DIFFERENTIAL/PLATELET - Abnormal; Notable for the following components:   RBC 3.96 (*)    Hemoglobin 10.6 (*)    HCT 35.7 (*)    MCHC 29.7 (*)    All other components within normal limits  CULTURE, BLOOD (ROUTINE X 2)  CULTURE, BLOOD (ROUTINE X 2)  I-STAT CG4 LACTIC ACID, ED  I-STAT CG4 LACTIC ACID, ED    EKG None  Radiology Dg Tibia/fibula Right  Result Date: 03/02/2018 CLINICAL DATA:  Redness and swelling after RIGHT leg embolectomy. EXAM: RIGHT TIBIA AND FIBULA - 2 VIEW COMPARISON:  RIGHT knee radiograph April 11, 2014. FINDINGS: There is no evidence of fracture or other focal bone lesions. 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 osteoarthrosis high-riding patella. Crescentic calcification medial femoral epicondyle seen with old ligament injury. Pretibial soft tissue swelling with skin staples anterior ankle. Mild vascular calcifications. No subcutaneous gas. IMPRESSION: 1. Soft tissue swelling without acute osseous process. Electronically Signed   By: Awilda Metro M.D.   On: 03/02/2018 20:21    Procedures Procedures (including critical care time)  Medications Ordered in ED Medications  doxycycline (VIBRA-TABS) tablet 100 mg (has no administration in time range)    morphine 4 MG/ML injection 4 mg (4 mg Intravenous Given 03/02/18 1958)  morphine 4 MG/ML injection 4 mg (4 mg Intravenous Given 03/02/18 2043)  oxyCODONE-acetaminophen (PERCOCET/ROXICET) 5-325 MG per tablet 2 tablet (2 tablets Oral Given 03/02/18 2223)     Initial Impression / Assessment and Plan / ED Course  I have reviewed the triage vital signs and the nursing notes.  Pertinent labs & imaging results that were available during my care of the patient were reviewed by me and considered in my medical decision making (see chart for  details).    Vital signs stable, right lower extremity concerning for cellulitis.  X-ray shows soft tissue swelling but no gas or osteomyelitis changes.  Labs showed normal WBC count, normal lactate.  Contacted vascular surgeon on-call, Dr. Chestine Sporelark, and reviewed work-up and physical exam findings.  Reviewed operative notes and noted that the patient does not have any graft in the right leg.  He feels comfortable treating the patient with oral antibiotics and having patient follow-up tomorrow as previously scheduled for staple removal and repeat exam by Dr. Darrick Pennafields.  I feel this is appropriate given the reassuring work-up and dopplered pulses on exam.  Patient has been compliant with his anticoagulation therefore I feel DVT is unlikely.  I have extensively reviewed return precautions with him and emphasized the importance of making it to his appointment tomorrow.  Patient voiced understanding.  Final Clinical Impressions(s) / ED Diagnoses   Final diagnoses:  Cellulitis of right leg    ED Discharge Orders         Ordered    doxycycline (VIBRAMYCIN) 100 MG capsule  2 times daily     03/02/18 2310           Little, Ambrose Finlandachel Morgan, MD 03/02/18 2314

## 2018-03-02 NOTE — ED Triage Notes (Signed)
Pt had DVT surgery, with now having pain swelling redness, and discharge. Denies chest pain, SOB. Pt able to bear weight on leg with pain. 158/82, 100, 18 resp, 99% room air. AO x 4.

## 2018-03-03 NOTE — ED Notes (Signed)
Patient verbalizes understanding of discharge instructions. Opportunity for questioning and answers were provided. Armband removed by staff, pt discharged from ED to home via POV  

## 2018-03-07 LAB — CULTURE, BLOOD (ROUTINE X 2)
CULTURE: NO GROWTH
CULTURE: NO GROWTH
SPECIAL REQUESTS: ADEQUATE

## 2018-03-09 ENCOUNTER — Other Ambulatory Visit: Payer: Self-pay

## 2018-03-09 ENCOUNTER — Encounter: Payer: Self-pay | Admitting: Vascular Surgery

## 2018-03-09 ENCOUNTER — Ambulatory Visit (INDEPENDENT_AMBULATORY_CARE_PROVIDER_SITE_OTHER): Payer: Self-pay | Admitting: Vascular Surgery

## 2018-03-09 VITALS — BP 143/87 | HR 74 | Temp 97.6°F | Resp 16 | Ht 68.0 in | Wt 208.0 lb

## 2018-03-09 DIAGNOSIS — I739 Peripheral vascular disease, unspecified: Secondary | ICD-10-CM

## 2018-03-09 NOTE — Progress Notes (Signed)
Patient is a 57 year old male who is status post right popliteal and tibial embolectomy in combination with left femoral to below-knee popliteal bypass with PTFE with left popliteal and tibial endarterectomy.  This was January 24, 2018.  Was done emergently for acute on chronic ischemia.  Also at the time of his presentation he had an acute right leg DVT.  He currently is on Xarelto.  He was seen in the emergency room a few days ago and thought to have some infection in the incision and the top of his foot was placed on Keflex.  He has 1 day of Keflex left at this point.  He denies claudication symptoms.  He denies rest pain.  He has no drainage from his wounds currently.  All of his other incisions are well-healed.  He denies fever or chills.  Past Medical History:  Diagnosis Date  . Alcohol abuse   . Anterior myocardial infarction [facility])    ST-elevation; S/P emergent  drug-eluting stenting of proximal left anterior descending  . Coronary artery disease    a. STEMI 2012 - s/p DES to LAD with  diffuse distal disease in the right posterolateral branch. The third OM was occluded and filled from left to left collaterals. LVEF preserved.  . Dysuria 04/26/2013  . Essential hypertension 05/16/2014  . Frequent urination 09/03/2014  . HNP (herniated nucleus pulposus), cervical 10/14/2014  . Hyperlipidemia   . Hypertension   . Ischemia of extremity 01/23/2018  . Lower abdominal pain 09/03/2014  . PAD (peripheral artery disease) (HCC) 08/11/2016  . PAD (peripheral artery disease) (HCC) 08/11/2016  . Pre-diabetes   . Renal insufficiency   . Special screening for malignant neoplasms, colon      Current Outpatient Medications on File Prior to Visit  Medication Sig Dispense Refill  . acetaminophen (TYLENOL) 325 MG tablet Take 1-2 tablets (325-650 mg total) by mouth every 4 (four) hours as needed for mild pain (headache, or temp >/= 101 F).    . docusate sodium (COLACE) 100 MG capsule Take 1 capsule (100 mg  total) by mouth 2 (two) times daily. 10 capsule 0  . doxycycline (VIBRAMYCIN) 100 MG capsule Take 1 capsule (100 mg total) by mouth 2 (two) times daily. 14 capsule 0  . lisinopril (PRINIVIL,ZESTRIL) 10 MG tablet Take 1 tablet (10 mg total) by mouth daily. 30 tablet 1  . methocarbamol (ROBAXIN) 500 MG tablet Take 1 tablet (500 mg total) by mouth 2 (two) times daily as needed for muscle spasms. 60 tablet 0  . metoprolol tartrate (LOPRESSOR) 25 MG tablet Take 1 tablet (25 mg total) by mouth 2 (two) times daily. 60 tablet 1  . nitroGLYCERIN (NITROSTAT) 0.4 MG SL tablet Place 1 tablet (0.4 mg total) under the tongue every 5 (five) minutes as needed for chest pain. 25 tablet 4  . oxyCODONE (OXYCONTIN) 15 mg 12 hr tablet Take 1 tablet (15 mg total) by mouth every 12 (twelve) hours. 14 tablet 0  . oxyCODONE-acetaminophen (PERCOCET/ROXICET) 5-325 MG tablet Take 1-2 tablets by mouth every 4 (four) hours as needed for moderate pain. 30 tablet 0  . pantoprazole (PROTONIX) 40 MG tablet Take 1 tablet (40 mg total) by mouth daily. 30 tablet 0  . Rivaroxaban (XARELTO) 15 MG TABS tablet 1 tablet twice daily x6 days and stop 12 tablet 0  . rivaroxaban (XARELTO) 20 MG TABS tablet Begin 1 tablet daily 02/18/2018 (Patient taking differently: Take 20 mg by mouth daily. Begin 1 tablet daily 02/18/2018) 30 tablet 1  .  rosuvastatin (CRESTOR) 40 MG tablet Take 1 tablet (40 mg total) by mouth daily. 90 tablet 1   No current facility-administered medications on file prior to visit.    Physical exam:  Vitals:   03/09/18 1032  BP: (!) 143/87  Pulse: 74  Resp: 16  Temp: 97.6 F (36.4 C)  TempSrc: Oral  SpO2: 97%  Weight: 208 lb (94.3 kg)  Height: 5\' 8"  (1.727 m)    Right lower extremity: 2+ femoral absent popliteal and pedal pulses right foot is warm and pink well-healed incision dorsum of foot staples removed today  Left lower extremity: Well-healed left groin incision no palpable pedal pulses foot is pink and  warm  Musculoskeletal: Both legs still slightly edematous but improved from during his hospitalization  Assessment: 1 peripheral arterial disease.  Viable lower extremities bilaterally.  She will be scheduled for bilateral ABIs and a left leg bypass duplex in November 2019.  He will see our nurse practitioner with that office visit.  2.  Right leg DVT treatment should be with anticoagulation for 3 months which would end in November.  I will have our nurse practitioner address that and discontinue the Xarelto at that office visit if there are no other further indications to continue it at that point.  When the Xarelto is discontinued he should resume his aspirin.  [name], MD [facility] Office: [phone] Pager: [phone]

## 2018-03-13 ENCOUNTER — Encounter: Payer: Medicare Other | Admitting: Podiatry

## 2018-03-20 NOTE — Progress Notes (Signed)
This encounter was created in error - please disregard.

## 2018-04-05 ENCOUNTER — Other Ambulatory Visit: Payer: Self-pay

## 2018-04-05 DIAGNOSIS — I739 Peripheral vascular disease, unspecified: Secondary | ICD-10-CM

## 2018-04-12 ENCOUNTER — Encounter: Payer: Medicare Other | Admitting: Podiatry

## 2018-04-19 NOTE — Progress Notes (Signed)
This encounter was created in error - please disregard.

## 2018-05-04 ENCOUNTER — Other Ambulatory Visit (HOSPITAL_COMMUNITY): Payer: Medicare Other

## 2018-05-04 ENCOUNTER — Encounter (HOSPITAL_COMMUNITY): Payer: Medicare Other

## 2018-05-04 ENCOUNTER — Ambulatory Visit: Payer: Medicare Other | Admitting: Vascular Surgery

## 2018-08-24 ENCOUNTER — Other Ambulatory Visit: Payer: Self-pay | Admitting: *Deleted

## 2018-08-24 ENCOUNTER — Telehealth: Payer: Self-pay | Admitting: Physician Assistant

## 2018-08-24 MED ORDER — LISINOPRIL 10 MG PO TABS: 10.0000 mg | ORAL_TABLET | Freq: Every day | ORAL | 0 refills | Status: DC

## 2018-08-24 MED ORDER — METOPROLOL TARTRATE 25 MG PO TABS: 25.0000 mg | ORAL_TABLET | Freq: Two times a day (BID) | ORAL | 0 refills | Status: DC

## 2018-08-24 NOTE — Telephone Encounter (Signed)
° ° ° °*  STAT* If patient is at the pharmacy, call can be transferred to refill team.   1. Which medications need to be refilled? (please list name of each medication and dose if known) lisinopril (PRINIVIL,ZESTRIL) 10 MG tablet and metoprolol tartrate (LOPRESSOR) 25 MG tablet 2. Which pharmacy/location (including street and city if local pharmacy) is medication to be sent to? [facility] - [place], [place] - [address]  3. Do they need a 30 day or 90 day supply? 30

## 2018-09-05 ENCOUNTER — Encounter: Payer: Self-pay | Admitting: *Deleted

## 2018-09-06 ENCOUNTER — Telehealth: Payer: Self-pay | Admitting: Physician Assistant

## 2018-09-06 NOTE — Telephone Encounter (Signed)
I spoke with patient concerning his upcoming appointment with me 09/13/2018 because of the coronavirus outbreak.  He is willing to do a WebEx appointment at that same time.  He does have my chart and I asked him to make sure he could log into that.  Please send him the information he needs to set up a WebEx appointment.

## 2018-09-06 NOTE — Telephone Encounter (Signed)
Called to get more information for evisit, left message instructing pt to the office.

## 2018-09-09 NOTE — Telephone Encounter (Signed)
Left message for patient to call back to get set up for Video WebEx appointment on 4/1

## 2018-09-11 DIAGNOSIS — Z86711 Personal history of pulmonary embolism: Secondary | ICD-10-CM | POA: Insufficient documentation

## 2018-09-11 NOTE — Progress Notes (Signed)
Virtual Visit via Telephone Note    Evaluation Performed:  Follow-up visit  This visit type was conducted due to national recommendations for restrictions regarding the COVID-19 Pandemic (e.g. social distancing).  This format is felt to be most appropriate for this patient at this time.  All issues noted in this document were discussed and addressed.  No physical exam was performed (except for noted visual exam findings with Video Visits).  Please refer to the patient's chart (MyChart message for video visits and phone note for telephone visits) for the patient's consent to telehealth for [facility].  Date:  09/13/2018   ID:  Michael [name], DOB 04-06-1961, MRN [id number]  Patient Location:  Home  Provider location:   Home  PCP:  Patient, No Pcp Per  Cardiologist:  Verne Carrow, MD   Electrophysiologist:  None   Chief Complaint:  Yearly f/u  History of Present Illness:    Michael [name] is a 58 y.o. male who presents via audio/video conferencing for a telehealth visit today.    He has a history of CAD status post STEMI treated with DES to the proximal LAD 03/31/11.  He had diffuse distal disease in the right PLA and the third OM was occluded and filled from left to left collaterals.  He had severe dye reaction.  LV function was preserved by echo.  Also has hypertension.  I last saw the patient 06/2017 at which time he was doing well without angina.  Blood pressure was well controlled and hyperlipidemia was managed by PCP.  01/2018 he had acute on chronic ischemia with severe tibial artery occlusive disease bilaterally right popliteal and tibial embolectomy with left femoral to below the knee popliteal bypass and left popliteal and tibial endarterectomy.  Also had acute right leg DVT and bilateral pulmonary emboli at that time with Xarelto.  Patient denies any chest pain or shortness of breath, dizziness, leg swelling, or syncope. Walk 1/2 mile twice a week. Trying to  stop soda and bread.   The patient does not symptoms concerning for COVID-19 infection (fever, chills, cough, or new shortness of breath).    Prior CV studies:   The following studies were reviewed today: CT angiogram 01/2018 IMPRESSION: VASCULAR   Arterial occlusion of the right SFA, right popliteal artery and majority of the right runoff vessels. There appears to be minimal distal reconstitution of the right posterior tibial artery.   Arterial occlusion of the left popliteal artery and proximal runoff vessels. Distal reconstitution of the left posterior tibial artery.   Bilateral pulmonary emboli. The chest is not completely imaged and the extent of the pulmonary emboli is not known.   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   Mild irregularity of the left renal artery and cannot exclude FMD.   NON-VASCULAR   Indeterminate exophytic low-density structure involving the left kidney lower pole that measures roughly 1.3 cm. Hounsfield units are not compatible with a simple cyst. Consider non emergent renal ultrasound to see if this is a hyperdense cyst. Patient had a renal ultrasound on 10/29/2016 and there may have been a small structure at this location but recommend follow-up renal ultrasound with attention to the left kidney lower pole.   Colonic diverticulosis.   These results will be called to the ordering clinician or representative by the Radiologist Assistant, and communication documented in the PACS or zVision Dashboard.     Electronically Signed   By: Meriel Pica.D.  On: 01/24/2018 08:18      Past Medical History:  Diagnosis Date  . Alcohol abuse   . Anterior myocardial infarction [facility])    ST-elevation; S/P emergent  drug-eluting stenting of proximal left anterior descending  . Coronary artery disease    a. STEMI 2012 - s/p DES to LAD with  diffuse distal disease in the right posterolateral branch. The third OM was occluded and  filled from left to left collaterals. LVEF preserved.  . Dysuria 04/26/2013  . Essential hypertension 05/16/2014  . Frequent urination 09/03/2014  . HNP (herniated nucleus pulposus), cervical 10/14/2014  . Hyperlipidemia   . Hypertension   . Ischemia of extremity 01/23/2018  . Lower abdominal pain 09/03/2014  . PAD (peripheral artery disease) (HCC) 08/11/2016  . PAD (peripheral artery disease) (HCC) 08/11/2016  . Pre-diabetes   . Renal insufficiency   . Special screening for malignant neoplasms, colon    Past Surgical History:  Procedure Laterality Date  . COLONOSCOPY N/A 08/16/2014   Procedure: COLONOSCOPY;  Surgeon: West Bali, MD;  Location: [facility];  Service: Endoscopy;  Laterality: N/A;  10:15 AM  . DOPPLER ECHOCARDIOGRAPHY     Preserved left venticular function  . EMBOLECTOMY Right 01/23/2018   Procedure: RIGHT POPLITEAL-TIBIAL  ARTERY EMBOLECTOMY, EXPLORATION OF RIGHT ANTERIOR TIBIAL ARTERY, RIGHT POPLITEAL ARTERY VEIN ANGIOPLASTY;  Surgeon: Sherren Kerns, MD;  Location: [facility] OR;  Service: Vascular;  Laterality: Right;  . ENDARTERECTOMY POPLITEAL Left 01/23/2018   Procedure: ENDARTERECTOMY OF LEFT POPLITEAL ARTERY AND TIBIAL-PERONEAL TRUNK;  Surgeon: Sherren Kerns, MD;  Location: [facility] OR;  Service: Vascular;  Laterality: Left;  . FEMORAL-POPLITEAL BYPASS GRAFT Left 01/23/2018   Procedure: BYPASS GRAFT LEFT FEMORAL-POPLITEAL ARTERY WITH PROPATEN VASCULAR GRAFT;  Surgeon: Sherren Kerns, MD;  Location[facility] OR;  Service: Vascular;  Laterality: Left;  . HEMATOMA EVACUATION Left 01/25/2018   Procedure: EVACUATION HEMATOMA LEFT POPLITEAL SPACE;  Surgeon: Cephus Shelling, MD;  Location: [facility] OR;  Service: Vascular;  Laterality: Left;  . LEG SURGERY     left leg - has rod and 4 pins  . PATELLAR TENDON REPAIR Right 04/26/2014   Procedure: RIGHT PATELLA TENDON REPAIR;  Surgeon: Sheral Apley, MD;  Location: [facility];  Service: Orthopedics;  Laterality: Right;      Current Meds  Medication Sig  . acetaminophen (TYLENOL) 325 MG tablet Take 1-2 tablets (325-650 mg total) by mouth every 4 (four) hours as needed for mild pain (headache, or temp >/= 101 F).  . docusate sodium (COLACE) 100 MG capsule Take 1 capsule (100 mg total) by mouth 2 (two) times daily.  Marland Kitchen doxycycline (VIBRAMYCIN) 100 MG capsule Take 1 capsule (100 mg total) by mouth 2 (two) times daily.  Marland Kitchen lisinopril (PRINIVIL,ZESTRIL) 10 MG tablet Take 1 tablet (10 mg total) by mouth daily.  . metoprolol tartrate (LOPRESSOR) 25 MG tablet Take 1 tablet (25 mg total) by mouth 2 (two) times daily.  . nitroGLYCERIN (NITROSTAT) 0.4 MG SL tablet Place 1 tablet (0.4 mg total) under the tongue every 5 (five) minutes as needed for chest pain.  . pantoprazole (PROTONIX) 40 MG tablet Take 1 tablet (40 mg total) by mouth daily.  . rivaroxaban (XARELTO) 20 MG TABS tablet Begin 1 tablet daily 02/18/2018 (Patient taking differently: Take 20 mg by mouth daily. Begin 1 tablet daily 02/18/2018)  . rosuvastatin (CRESTOR) 40 MG tablet Take 1 tablet (40 mg total) by mouth daily.     Allergies:   Ivp  dye [iodinated diagnostic agents]   Social History   Tobacco Use  . Smoking status: Never Smoker  . Smokeless tobacco: Never Used  Substance Use Topics  . Alcohol use: Yes    Comment: Drinks occassionally  . Drug use: No     Family Hx: The patient's family history includes Cancer in his maternal aunt; Diabetes in an other family member; Heart disease in an other family member; Hyperlipidemia in an other family member.  ROS:   Please see the history of present illness.     Review of Systems  Constitution: Positive for weight gain.  HENT: Negative.   Cardiovascular: Negative.   Respiratory: Negative.   Endocrine: Negative.   Hematologic/Lymphatic: Negative.   Musculoskeletal: Positive for muscle weakness.  Gastrointestinal: Negative.   Genitourinary: Negative.   Neurological: Positive for weakness.    All  other systems reviewed and are negative.   Labs/Other Tests and Data Reviewed:    Recent Labs: 03/02/2018: ALT 17; BUN 16; Creatinine, Ser 1.35; Hemoglobin 10.6; Platelets 240; Potassium 4.0; Sodium 138   Recent Lipid Panel Lab Results  Component Value Date/Time   CHOL 133 08/11/2016 10:30 AM   TRIG 96 08/11/2016 10:30 AM   HDL 37 (L) 08/11/2016 10:30 AM   CHOLHDL 3.6 08/11/2016 10:30 AM   LDLCALC 77 08/11/2016 10:30 AM   LDLDIRECT 164.1 05/04/2013 11:07 AM    Wt Readings from Last 3 Encounters:  09/13/18 212 lb (96.2 kg)  03/09/18 208 lb (94.3 kg)  03/02/18 249 lb 1.9 oz (113 kg)     Exam:    Vital Signs:  BP 140/70   Pulse 98   Ht 5\' 8"  (1.727 m)   Wt 212 lb (96.2 kg)   BMI 32.23 kg/m       ASSESSMENT & PLAN:    1.  CAD status post STEMI treated with DES to the proximal LAD 03/31/2011 diffuse disease in the right PLA and third OM was occluded and filled from left to left collaterals.  He had a severe dye reaction.  LV function was preserved by 2D echo.not on ASA since Xarelto but to be resumed once Xarelto stoped. No angina. F/u with Dr. Clifton James in 6 months  Essential hypertension BP stable on lisinopril and metoprolol  Hyperlipidemia LDL 77 07/2016-needs FLP & LFT's in 6 months. On Crestor  PAD with acute on chronic ischemia status post right popliteal and tibial embolectomy with left femoral to below the knee popliteal bypass and left popliteal and tibial endarterectomy 01/2018 by Dr. Darrick Penna  History of DVT and bilateral pulmonary emboli 01/2018 treated with Isaac Bliss trouble affording it but Dr. Darrick Penna working to get him assistance  COVID-19 Education: The signs and symptoms of COVID-19 were discussed with the patient and how to seek care for testing (follow up with PCP or arrange E-visit).  The importance of social distancing was discussed today.  Patient Risk:   After full review of this patients clinical status, I feel that they are at least moderate risk  at this time.  Time:   Today, I have spent  15 minutes with the patient with telehealth technology discussing  His cardiac health. See above.     Medication Adjustments/Labs and Tests Ordered: Current medicines are reviewed at length with the patient today.  Concerns regarding medicines are outlined above.  Tests Ordered: No orders of the defined types were placed in this encounter.  Medication Changes: No orders of the defined types were placed in this encounter.  Disposition:  Follow up in 6 month(s) with Dr. [name]  Signed, Jacolyn ReedyMichele Samamtha Tiegs, PA-C  09/13/2018 3:40 PM    [facility]

## 2018-09-12 NOTE — Telephone Encounter (Signed)
Virtual Visit Pre-Appointment Phone Call    TELEPHONE CALL NOTE  KONNOR BOLITHO has been deemed a candidate for a follow-up tele-health visit to limit community exposure during the Covid-19 pandemic. I spoke with the patient via phone to ensure availability of phone/video source, confirm preferred email & phone number, and discuss instructions and expectations.  I reminded ANDRIC HOTZ to be prepared with any vital sign and/or heart rhythm information that could potentially be obtained via home monitoring, at the time of his visit. I reminded DHYAN LENK to expect a phone call at the time of his visit if his visit.  Did the patient verbally acknowledge consent to treatment? YES  Patient was unable to get [facility] downloaded on his phone. Patient consents to proceed with a TELEPHONE Visit.  Lattie Haw, RN 09/12/2018 2:50 PM   DOWNLOADING THE WEBEX SOFTWARE TO SMARTPHONE  - If Apple, go to [employer] and type in WebEx in the search bar. Download [employer], the blue/green circle. The app is free but as with any other app downloads, their phone may require them to verify saved payment information or Apple password. The patient does NOT have to create an account.  - If Android, ask patient to go to [employer] and type in WebEx in the search bar. Download [employer], the blue/green circle. The app is free but as with any other app downloads, their phone may require them to verify saved payment information or Android password. The patient does NOT have to create an account.   CONSENT FOR TELE-HEALTH VISIT - PLEASE REVIEW  I hereby voluntarily request, consent and authorize [facility] and its employed or contracted physicians, physician assistants, nurse practitioners or other licensed health care professionals (the Practitioner), to provide me with telemedicine health care services (the "Services") as deemed necessary by the treating  Practitioner. I acknowledge and consent to receive the Services by the Practitioner via telemedicine. I understand that the telemedicine visit will involve communicating with the Practitioner through live audiovisual communication technology and the disclosure of certain medical information by electronic transmission. I acknowledge that I have been given the opportunity to request an in-person assessment or other available alternative prior to the telemedicine visit and am voluntarily participating in the telemedicine visit.  I understand that I have the right to withhold or withdraw my consent to the use of telemedicine in the course of my care at any time, without affecting my right to future care or treatment, and that the Practitioner or I may terminate the telemedicine visit at any time. I understand that I have the right to inspect all information obtained and/or recorded in the course of the telemedicine visit and may receive copies of available information for a reasonable fee.  I understand that some of the potential risks of receiving the Services via telemedicine include:  Marland Kitchen Delay or interruption in medical evaluation due to technological equipment failure or disruption; . Information transmitted may not be sufficient (e.g. poor resolution of images) to allow for appropriate medical decision making by the Practitioner; and/or  . In rare instances, security protocols could fail, causing a breach of personal health information.  Furthermore, I acknowledge that it is my responsibility to provide information about my medical history, conditions and care that is complete and accurate to the best of my ability. I acknowledge that Practitioner's advice, recommendations, and/or decision may be based on factors not within their control, such as incomplete or  inaccurate data provided by me or distortions of diagnostic images or specimens that may result from electronic transmissions. I understand that the  practice of medicine is not an exact science and that Practitioner makes no warranties or guarantees regarding treatment outcomes. I acknowledge that I will receive a copy of this consent concurrently upon execution via email to the email address I last provided but may also request a printed copy by calling the office of [facility].    I understand that my insurance will be billed for this visit.   I have read or had this consent read to me. . I understand the contents of this consent, which adequately explains the benefits and risks of the Services being provided via telemedicine.  . I have been provided ample opportunity to ask questions regarding this consent and the Services and have had my questions answered to my satisfaction. . I give my informed consent for the services to be provided through the use of telemedicine in my medical care  By participating in this telemedicine visit I agree to the above.

## 2018-09-13 ENCOUNTER — Encounter: Payer: Self-pay | Admitting: Physician Assistant

## 2018-09-13 ENCOUNTER — Other Ambulatory Visit: Payer: Self-pay

## 2018-09-13 ENCOUNTER — Telehealth: Payer: Self-pay

## 2018-09-13 ENCOUNTER — Telehealth: Payer: Self-pay | Admitting: Physician Assistant

## 2018-09-13 ENCOUNTER — Telehealth (INDEPENDENT_AMBULATORY_CARE_PROVIDER_SITE_OTHER): Payer: Medicare PPO | Admitting: Physician Assistant

## 2018-09-13 VITALS — BP 140/70 | HR 98 | Ht 68.0 in | Wt 212.0 lb

## 2018-09-13 DIAGNOSIS — I2510 Atherosclerotic heart disease of native coronary artery without angina pectoris: Secondary | ICD-10-CM

## 2018-09-13 DIAGNOSIS — Z79899 Other long term (current) drug therapy: Secondary | ICD-10-CM

## 2018-09-13 DIAGNOSIS — I10 Essential (primary) hypertension: Secondary | ICD-10-CM | POA: Diagnosis not present

## 2018-09-13 DIAGNOSIS — E785 Hyperlipidemia, unspecified: Secondary | ICD-10-CM | POA: Diagnosis not present

## 2018-09-13 DIAGNOSIS — I739 Peripheral vascular disease, unspecified: Secondary | ICD-10-CM

## 2018-09-13 DIAGNOSIS — Z86711 Personal history of pulmonary embolism: Secondary | ICD-10-CM

## 2018-09-13 MED ORDER — METOPROLOL TARTRATE 25 MG PO TABS: 25.0000 mg | ORAL_TABLET | Freq: Two times a day (BID) | ORAL | 3 refills | Status: DC

## 2018-09-13 MED ORDER — RIVAROXABAN 20 MG PO TABS: 20.0000 mg | ORAL_TABLET | Freq: Every day | ORAL | 1 refills | Status: DC

## 2018-09-13 MED ORDER — NITROGLYCERIN 0.4 MG SL SUBL: 0.4000 mg | SUBLINGUAL_TABLET | SUBLINGUAL | 4 refills | Status: DC | PRN

## 2018-09-13 MED ORDER — ROSUVASTATIN CALCIUM 40 MG PO TABS: 40.0000 mg | ORAL_TABLET | Freq: Every day | ORAL | 3 refills | Status: DC

## 2018-09-13 MED ORDER — LISINOPRIL 10 MG PO TABS: 10.0000 mg | ORAL_TABLET | Freq: Every day | ORAL | 1 refills | Status: DC

## 2018-09-13 NOTE — Telephone Encounter (Signed)
Called pt 4 times for televisit today at 1 pm. Pt did not answer, left voicemail asking him to call the office back.

## 2018-09-13 NOTE — Telephone Encounter (Signed)
Called and spoke with the patient he states that he hate he missed the call. He is available now to do the call. Will make [facility] aware and she will call the patient.

## 2018-09-13 NOTE — Telephone Encounter (Signed)
New ]Message   Pt returned call and apologized for missing his virtual visit call and is asking for a call back

## 2018-09-13 NOTE — Patient Instructions (Signed)
Medication Instructions:  Your physician recommends that you continue on your current medications as directed. Please refer to the Current Medication list given to you today.  If you need a refill on your cardiac medications before your next appointment, please call your pharmacy.   Lab work: Your physician recommends that you return for a FASTING lipid profile and liver function panel in the summer   If you have labs (blood work) drawn today and your tests are completely normal, you will receive your results only by: Marland Kitchen MyChart Message (if you have MyChart) OR . A paper copy in the mail If you have any lab test that is abnormal or we need to change your treatment, we will call you to review the results.  Testing/Procedures: None ordered  Follow-Up: At [facility], you and your health needs are our priority.  As part of our continuing mission to provide you with exceptional heart care, we have created designated Provider Care Teams.  These Care Teams include your primary Cardiologist (physician) and Advanced Practice Providers (APPs -  Physician Assistants and Nurse Practitioners) who all work together to provide you with the care you need, when you need it. . You will need a follow up appointment in 6 months.  Please call our office 2 months in advance to schedule this appointment.  You may see Earney Hamburg, MD or one of the following Advanced Practice Providers on your designated Care Team:   . Robbie Lis, PA-C . Dayna Dunn, PA-C . Jacolyn Reedy, PA-C  Any Other Special Instructions Will Be Listed Below (If Applicable).

## 2018-09-21 ENCOUNTER — Other Ambulatory Visit: Payer: Self-pay | Admitting: Physician Assistant

## 2018-09-21 ENCOUNTER — Other Ambulatory Visit (HOSPITAL_COMMUNITY): Payer: Medicare Other

## 2018-09-21 ENCOUNTER — Ambulatory Visit: Payer: Medicare Other | Admitting: Vascular Surgery

## 2018-09-21 ENCOUNTER — Encounter (HOSPITAL_COMMUNITY): Payer: Medicare Other

## 2018-09-21 DIAGNOSIS — I739 Peripheral vascular disease, unspecified: Secondary | ICD-10-CM

## 2018-09-21 MED ORDER — ROSUVASTATIN CALCIUM 40 MG PO TABS: 40.0000 mg | ORAL_TABLET | Freq: Every day | ORAL | 3 refills | Status: DC

## 2018-09-21 MED ORDER — RIVAROXABAN 20 MG PO TABS: 20.0000 mg | ORAL_TABLET | Freq: Every day | ORAL | 1 refills | Status: DC

## 2018-10-30 ENCOUNTER — Telehealth: Payer: Self-pay

## 2018-10-30 NOTE — Telephone Encounter (Signed)
Pt called and said that he was on Xarelto and he could not afford to continue taking this medication.   Called pt and told him that he would need to call his cardiologist because they are managing his blood thinner. Number given for him to call them.   Pt said that he would like to also get back on the schedule for his 6 mth follow up given it was canceled due to Covid. Information given to schedulers.   Julious Payer, CMA

## 2018-10-31 ENCOUNTER — Telehealth: Payer: Self-pay | Admitting: Vascular Surgery

## 2018-10-31 NOTE — Telephone Encounter (Signed)
-----   Message from Sherren Kerns, MD sent at 10/31/2018  9:36 AM EDT ----- Regarding: RE: Xarelto cost Contact: [phone] Stop xarelto use aspirin alone  [name] ----- Message ----- From: Yolonda Kida, LPN Sent: 5/00/3704   3:04 PM EDT To: Sherren Kerns, MD Subject: Xarelto cost                                   Hey Dr. Darrick Penna.  Pt says his Xarelto will cost $300 a mth and is too expensive.  He is down to 1 pill and is scared of getting a blood clot.  What other medicine can you prescribe?  Please advise.  Thanks,  Ernst Spell., LPN

## 2018-10-31 NOTE — Telephone Encounter (Signed)
Pt is aware and verbalized understanding.  He has a follow up appt next mth.  Ernst Spell., LPN

## 2018-11-01 ENCOUNTER — Telehealth: Payer: Self-pay

## 2018-11-01 NOTE — Telephone Encounter (Addendum)
**Note De-Identified Michael [name] Obfuscation** Unclear who did this Xarelto tier exception but we did receive an approval Kalika Smay fax from [facility]. Approval good until 06/14/2019.   I have notified the pt and [facility] of this approval. The pt expressed much gratitude.

## 2018-11-16 ENCOUNTER — Other Ambulatory Visit (HOSPITAL_COMMUNITY): Payer: Self-pay

## 2018-11-16 ENCOUNTER — Ambulatory Visit: Payer: Self-pay | Admitting: Vascular Surgery

## 2018-11-16 ENCOUNTER — Encounter (HOSPITAL_COMMUNITY): Payer: Medicare PPO

## 2018-11-22 ENCOUNTER — Telehealth (HOSPITAL_COMMUNITY): Payer: Self-pay | Admitting: Rehabilitation

## 2018-11-22 NOTE — Telephone Encounter (Signed)

## 2018-11-23 ENCOUNTER — Ambulatory Visit (INDEPENDENT_AMBULATORY_CARE_PROVIDER_SITE_OTHER): Payer: Medicare Other | Admitting: Vascular Surgery

## 2018-11-23 ENCOUNTER — Ambulatory Visit (INDEPENDENT_AMBULATORY_CARE_PROVIDER_SITE_OTHER)
Admission: RE | Admit: 2018-11-23 | Discharge: 2018-11-23 | Disposition: A | Discharge status: Home / Self Care | Payer: Medicare Other | Source: Ambulatory Visit | Attending: Vascular Surgery | Admitting: Vascular Surgery

## 2018-11-23 ENCOUNTER — Ambulatory Visit (HOSPITAL_COMMUNITY)
Admission: RE | Admit: 2018-11-23 | Discharge: 2018-11-23 | Disposition: A | Discharge status: Home / Self Care | Payer: Medicare Other | Source: Ambulatory Visit | Attending: Vascular Surgery | Admitting: Vascular Surgery

## 2018-11-23 ENCOUNTER — Encounter: Payer: Self-pay | Admitting: Vascular Surgery

## 2018-11-23 ENCOUNTER — Other Ambulatory Visit: Payer: Self-pay

## 2018-11-23 VITALS — BP 132/90 | HR 54 | Temp 98.0°F | Ht 68.0 in | Wt 212.8 lb

## 2018-11-23 DIAGNOSIS — I739 Peripheral vascular disease, unspecified: Secondary | ICD-10-CM

## 2018-11-23 DIAGNOSIS — H539 Unspecified visual disturbance: Secondary | ICD-10-CM | POA: Insufficient documentation

## 2018-11-24 NOTE — Progress Notes (Signed)
Patient is a 58 year old male previously underwent right popliteal and tibial embolectomy August 2019.  He subsequently underwent left femoral to below-knee popliteal bypass with propatent August 2019.  These procedures were both done for acute ischemia.  Patient currently has no rest pain or tissue loss.  He experiences claudication symptoms at about 3 blocks.  He states his walking distance is improving.  He does not smoke.  Of note he has noted a visual field cut in the left lateral aspect of his eye.  He denies any episode of amaurosis.  He has not had any upper extremity or lower extremity weakness or slurred speech.  Past Surgical History:  Procedure Laterality Date  . COLONOSCOPY N/A 08/16/2014   Procedure: COLONOSCOPY;  Surgeon: Danie Binder, MD;  [facility];  Service: Endoscopy;  Laterality: N/A;  10:15 AM  . DOPPLER ECHOCARDIOGRAPHY     Preserved left venticular function  . EMBOLECTOMY Right 01/23/2018   Procedure: RIGHT POPLITEAL-TIBIAL  ARTERY EMBOLECTOMY, EXPLORATION OF RIGHT ANTERIOR TIBIAL ARTERY, RIGHT POPLITEAL ARTERY VEIN ANGIOPLASTY;  Surgeon: Elam Dutch, MD;  Location: [place];  Service: Vascular;  Laterality: Right;  . ENDARTERECTOMY POPLITEAL Left 01/23/2018   Procedure: ENDARTERECTOMY OF LEFT POPLITEAL ARTERY AND TIBIAL-PERONEAL TRUNK;  Surgeon: Elam Dutch, MD;  Location: [place];  Service: Vascular;  Laterality: Left;  . FEMORAL-POPLITEAL BYPASS GRAFT Left 01/23/2018   Procedure: BYPASS GRAFT LEFT FEMORAL-POPLITEAL ARTERY WITH PROPATEN VASCULAR GRAFT;  Surgeon: Elam Dutch, MD;  Location: [place];  Service: Vascular;  Laterality: Left;  . HEMATOMA EVACUATION Left 01/25/2018   Procedure: EVACUATION HEMATOMA LEFT POPLITEAL SPACE;  Surgeon: Marty Heck, MD;  Location: [place];  Service: Vascular;  Laterality: Left;  . LEG SURGERY     left leg - has rod and 4 pins  . PATELLAR TENDON REPAIR Right 04/26/2014   Procedure: RIGHT PATELLA TENDON  REPAIR;  Surgeon: Renette Butters, MD;  Location: [place];  Service: Orthopedics;  Laterality: Right;   View of systems: He denies shortness of breath.  He denies chest pain.  Physical exam:  Vitals:   11/23/18 1434  BP: 132/90  Pulse: (!) 54  Temp: 98 F (36.7 C)  TempSrc: Temporal  SpO2: 98%  Weight: 212 lb 12.8 oz (96.5 kg)  Height: 5\' 8"  (1.727 m)    Extremities: No palpable pedal pulses  Skin: No open ulcer no rash  Neck: 2+ carotid pulses  Data: Patient had a carotid duplex exam today to evaluate based on his eye symptoms.  This showed no significant carotid stenosis.  The patient also had bilateral ABIs and a graft duplex today.  This showed that the bypass graft on the left side has occluded.  His right and left ABIs were both about 0.6.  Assessment: #1 peripheral arterial disease patient currently has reasonable perfusion to both lower extremities with ABIs of 0.6 and has claudication symptoms.  These are not very disabling to him.  He will continue walking as much as possible.  We would consider a redo bypass only in the event of limb threatening ischemia.  Fortunately currently he does not have that.  Patient will follow-up with our nurse practitioner with repeat ABIs in 1 year.  2.  Left eye visual field cut no evidence of significant carotid occlusive disease patient was encouraged to follow-up with his ophthalmologist regarding this.  Ruta Hinds, MD [facility] Office: [phone] Pager: [phone]

## 2018-12-01 ENCOUNTER — Emergency Department (HOSPITAL_COMMUNITY): Payer: Medicare Other

## 2018-12-01 ENCOUNTER — Other Ambulatory Visit: Payer: Self-pay

## 2018-12-01 ENCOUNTER — Encounter (HOSPITAL_COMMUNITY): Payer: Self-pay | Admitting: Emergency Medicine

## 2018-12-01 ENCOUNTER — Emergency Department (HOSPITAL_COMMUNITY)
Admission: EM | Admit: 2018-12-01 | Discharge: 2018-12-01 | Disposition: A | Discharge status: Home / Self Care | Payer: Medicare Other | Attending: Emergency Medicine | Admitting: Emergency Medicine

## 2018-12-01 DIAGNOSIS — Z79899 Other long term (current) drug therapy: Secondary | ICD-10-CM | POA: Diagnosis not present

## 2018-12-01 DIAGNOSIS — H538 Other visual disturbances: Secondary | ICD-10-CM | POA: Diagnosis present

## 2018-12-01 DIAGNOSIS — I639 Cerebral infarction, unspecified: Secondary | ICD-10-CM | POA: Diagnosis not present

## 2018-12-01 DIAGNOSIS — Z7982 Long term (current) use of aspirin: Secondary | ICD-10-CM | POA: Diagnosis not present

## 2018-12-01 DIAGNOSIS — I2510 Atherosclerotic heart disease of native coronary artery without angina pectoris: Secondary | ICD-10-CM | POA: Insufficient documentation

## 2018-12-01 DIAGNOSIS — I129 Hypertensive chronic kidney disease with stage 1 through stage 4 chronic kidney disease, or unspecified chronic kidney disease: Secondary | ICD-10-CM | POA: Insufficient documentation

## 2018-12-01 DIAGNOSIS — N182 Chronic kidney disease, stage 2 (mild): Secondary | ICD-10-CM | POA: Diagnosis not present

## 2018-12-01 DIAGNOSIS — I252 Old myocardial infarction: Secondary | ICD-10-CM | POA: Diagnosis not present

## 2018-12-01 LAB — PROTIME-INR
INR: 1.3 — ABNORMAL HIGH (ref 0.8–1.2)
Prothrombin Time: 16.2 seconds — ABNORMAL HIGH (ref 11.4–15.2)

## 2018-12-01 LAB — COMPREHENSIVE METABOLIC PANEL
ALT: 25 U/L (ref 0–44)
AST: 19 U/L (ref 15–41)
Albumin: 3.3 g/dL — ABNORMAL LOW (ref 3.5–5.0)
Alkaline Phosphatase: 49 U/L (ref 38–126)
Anion gap: 7 (ref 5–15)
BUN: 20 mg/dL (ref 6–20)
CO2: 26 mmol/L (ref 22–32)
Calcium: 9 mg/dL (ref 8.9–10.3)
Chloride: 105 mmol/L (ref 98–111)
Creatinine, Ser: 1.19 mg/dL (ref 0.61–1.24)
GFR calc Af Amer: >60 mL/min (ref >60)
GFR calc non Af Amer: >60 mL/min (ref >60)
Glucose, Bld: 122 mg/dL — ABNORMAL HIGH (ref 70–99)
Potassium: 4 mmol/L (ref 3.5–5.1)
Sodium: 138 mmol/L (ref 135–145)
Total Bilirubin: 0.4 mg/dL (ref 0.3–1.2)
Total Protein: 6.3 g/dL — ABNORMAL LOW (ref 6.5–8.1)

## 2018-12-01 LAB — RAPID URINE DRUG SCREEN, HOSP PERFORMED
Amphetamines: NOT DETECTED
Barbiturates: NOT DETECTED
Benzodiazepines: NOT DETECTED
Cocaine: POSITIVE — AB
Opiates: NOT DETECTED
Tetrahydrocannabinol: NOT DETECTED

## 2018-12-01 LAB — I-STAT CHEM 8, ED
BUN: 21 mg/dL — ABNORMAL HIGH (ref 6–20)
Calcium, Ion: 1.15 mmol/L (ref 1.15–1.40)
Chloride: 103 mmol/L (ref 98–111)
Creatinine, Ser: 1.2 mg/dL (ref 0.61–1.24)
Glucose, Bld: 115 mg/dL — ABNORMAL HIGH (ref 70–99)
HCT: 45 % (ref 39.0–52.0)
Hemoglobin: 15.3 g/dL (ref 13.0–17.0)
Potassium: 3.9 mmol/L (ref 3.5–5.1)
Sodium: 137 mmol/L (ref 135–145)
TCO2: 26 mmol/L (ref 22–32)

## 2018-12-01 LAB — URINALYSIS, ROUTINE W REFLEX MICROSCOPIC
Bacteria, UA: NONE SEEN
Bilirubin Urine: NEGATIVE
Glucose, UA: NEGATIVE mg/dL
Ketones, ur: NEGATIVE mg/dL
Leukocytes,Ua: NEGATIVE
Nitrite: NEGATIVE
Protein, ur: NEGATIVE mg/dL
Specific Gravity, Urine: 1.01 (ref 1.005–1.030)
pH: 6 (ref 5.0–8.0)

## 2018-12-01 LAB — CBC
HCT: 45 % (ref 39.0–52.0)
Hemoglobin: 14.3 g/dL (ref 13.0–17.0)
MCH: 27.7 pg (ref 26.0–34.0)
MCHC: 31.8 g/dL (ref 30.0–36.0)
MCV: 87 fL (ref 80.0–100.0)
Platelets: 204 10*3/uL (ref 150–400)
RBC: 5.17 MIL/uL (ref 4.22–5.81)
RDW: 12.9 % (ref 11.5–15.5)
WBC: 5.7 10*3/uL (ref 4.0–10.5)
nRBC: 0 % (ref 0.0–0.2)

## 2018-12-01 LAB — ETHANOL: Alcohol, Ethyl (B): <10 mg/dL (ref <10)

## 2018-12-01 LAB — DIFFERENTIAL
Abs Immature Granulocytes: 0.01 10*3/uL (ref 0.00–0.07)
Basophils Absolute: 0 10*3/uL (ref 0.0–0.1)
Basophils Relative: 0 %
Eosinophils Absolute: 0.1 10*3/uL (ref 0.0–0.5)
Eosinophils Relative: 1 %
Immature Granulocytes: 0 %
Lymphocytes Relative: 32 %
Lymphs Abs: 1.8 10*3/uL (ref 0.7–4.0)
Monocytes Absolute: 0.6 10*3/uL (ref 0.1–1.0)
Monocytes Relative: 11 %
Neutro Abs: 3.2 10*3/uL (ref 1.7–7.7)
Neutrophils Relative %: 56 %

## 2018-12-01 LAB — APTT: aPTT: 33 seconds (ref 24–36)

## 2018-12-01 LAB — CBG MONITORING, ED: Glucose-Capillary: 142 mg/dL — ABNORMAL HIGH (ref 70–99)

## 2018-12-01 NOTE — ED Triage Notes (Signed)
Pt here for stroke like symptoms. Black right side of left eye. Been going on for weeks and has seen multiple doctors. Saw eye doctor twice and sent here after.

## 2018-12-01 NOTE — ED Notes (Signed)
Patient's request-(turkey sandwich/sprite)-per Dr. Darl Householder given by Levada Dy

## 2018-12-01 NOTE — Discharge Instructions (Signed)
You had a stroke several weeks ago.   Take ASA 81 mg daily.   See [place] neurologist for follow up   You had some cocaine in your system so please avoid anything that can have cocaine in it   Return to ER if you have worse blurry vision, loss of vision, trouble speaking, weakness, numbness

## 2018-12-01 NOTE — ED Notes (Signed)
Pt verbalized understanding of discharge instructions and denies any further questions at this time.   

## 2018-12-01 NOTE — ED Provider Notes (Signed)
[place] EMERGENCY DEPARTMENT Provider Note   CSN: [id number] Arrival date & time: 12/01/18  1210    History   Chief Complaint Chief Complaint  Patient presents with  . Blurred Vision    HPI Michael [name] is a 58 y.o. male history of previous MI, CAD, previous PE on Xarelto, here presenting with blurry vision.  Patient states that he has blurry vision on the left side for the last 2 to 3 weeks.  He has been referred to ophthalmology multiple times and eventually saw specialist, Dr. Manuella Ghazi this morning. He had a dilated exam and detailed ophthalmology exam that showed a left hemianopsia bilaterally and patient is subsequently sent here for stroke work-up.  Patient denies any trouble walking or weakness or trouble speaking.  Patient denies any trouble with his balance.  Denies history of stroke in the past but does have MRI as well as peripheral artery disease status post stent.     The history is provided by the patient.    Past Medical History:  Diagnosis Date  . Alcohol abuse   . Anterior myocardial infarction [facility])    ST-elevation; S/P emergent  drug-eluting stenting of proximal left anterior descending  . Coronary artery disease    a. STEMI 2012 - s/p DES to LAD with  diffuse distal disease in the right posterolateral branch. The third OM was occluded and filled from left to left collaterals. LVEF preserved.  . Dysuria 04/26/2013  . Essential hypertension 05/16/2014  . Frequent urination 09/03/2014  . HNP (herniated nucleus pulposus), cervical 10/14/2014  . Hyperlipidemia   . Hypertension   . Ischemia of extremity 01/23/2018  . Lower abdominal pain 09/03/2014  . PAD (peripheral artery disease) ([place]) 08/11/2016  . PAD (peripheral artery disease) ([place]) 08/11/2016  . Pre-diabetes   . Renal insufficiency   . Special screening for malignant neoplasms, colon     Patient Active Problem List   Diagnosis Date Noted  . History of pulmonary embolism 09/11/2018   . Obsessive-compulsive disorder   . Muscle cramps   . Postoperative pain   . Therapeutic opioid induced constipation   . CKD (chronic kidney disease), stage II   . Acute blood loss anemia   . Debility   . Ischemia of extremity 01/23/2018  . Frequency of urination and polyuria 07/06/2017  . Erectile dysfunction due to diseases classified elsewhere 10/27/2016  . PAD (peripheral artery disease) ([place]) 08/11/2016  . Nocturia 08/11/2016  . Chronic pain of both knees 04/01/2016  . Palpitations 05/05/2015  . Hyperlipidemia 05/05/2015  . HNP (herniated nucleus pulposus), cervical 10/14/2014  . Chronic low back pain 10/14/2014  . Numbness and tingling of both upper extremities while sleeping 10/14/2014  . Frequent urination 09/03/2014  . Lower abdominal pain 09/03/2014  . Cervical stenosis of spine 05/16/2014  . Essential hypertension 05/16/2014  . Numbness and tingling of right arm 11/28/2013  . Dental abscess 04/26/2013  . Dysuria 04/26/2013  . HTN (hypertension) 04/04/2013  . CAD (coronary artery disease) 05/25/2011    Past Surgical History:  Procedure Laterality Date  . COLONOSCOPY N/A 08/16/2014   Procedure: COLONOSCOPY;  Surgeon: Danie Binder, MD;  [facility];  Service: Endoscopy;  Laterality: N/A;  10:15 AM  . DOPPLER ECHOCARDIOGRAPHY     Preserved left venticular function  . EMBOLECTOMY Right 01/23/2018   Procedure: RIGHT POPLITEAL-TIBIAL  ARTERY EMBOLECTOMY, EXPLORATION OF RIGHT ANTERIOR TIBIAL ARTERY, RIGHT POPLITEAL ARTERY VEIN ANGIOPLASTY;  Surgeon: Elam Dutch, MD;  [facility];  Service: Vascular;  Laterality: Right;  . ENDARTERECTOMY POPLITEAL Left 01/23/2018   Procedure: ENDARTERECTOMY OF LEFT POPLITEAL ARTERY AND TIBIAL-PERONEAL TRUNK;  Surgeon: Sherren KernsFields, Charles E, MD;  [facility];  Service: Vascular;  Laterality: Left;  . FEMORAL-POPLITEAL BYPASS GRAFT Left 01/23/2018   Procedure: BYPASS GRAFT LEFT FEMORAL-POPLITEAL ARTERY WITH PROPATEN  VASCULAR GRAFT;  Surgeon: Sherren KernsFields, Charles E, MD;  [facility];  Service: Vascular;  Laterality: Left;  . HEMATOMA EVACUATION Left 01/25/2018   Procedure: EVACUATION HEMATOMA LEFT POPLITEAL SPACE;  Surgeon: Cephus Shellinglark, Christopher J, MD;  [facility];  Service: Vascular;  Laterality: Left;  . LEG SURGERY     left leg - has rod and 4 pins  . PATELLAR TENDON REPAIR Right 04/26/2014   Procedure: RIGHT PATELLA TENDON REPAIR;  Surgeon: Sheral Apleyimothy D Murphy, MD;  Location: [facility];  Service: Orthopedics;  Laterality: Right;        Home Medications    Prior to Admission medications   Medication Sig Start Date End Date Taking? Authorizing Provider  aspirin 81 MG chewable tablet Chew 81 mg by mouth daily.   Yes [provider]  lisinopril (PRINIVIL,ZESTRIL) 10 MG tablet Take 1 tablet (10 mg total) by mouth daily. 09/13/18  Yes Dyann KiefLenze, Michele M, PA-C  metoprolol tartrate (LOPRESSOR) 25 MG tablet Take 1 tablet (25 mg total) by mouth 2 (two) times daily. 09/13/18  Yes Dyann KiefLenze, Michele M, PA-C  nitroGLYCERIN (NITROSTAT) 0.4 MG SL tablet Place 1 tablet (0.4 mg total) under the tongue every 5 (five) minutes as needed for chest pain. 09/13/18  Yes Dyann KiefLenze, Michele M, PA-C  rivaroxaban (XARELTO) 20 MG TABS tablet Take 1 tablet (20 mg total) by mouth daily. Begin 1 tablet daily 02/18/2018 Patient taking differently: Take 20 mg by mouth daily.  09/21/18  Yes Dyann KiefLenze, Michele M, PA-C  rosuvastatin (CRESTOR) 40 MG tablet Take 1 tablet (40 mg total) by mouth daily. 09/21/18  Yes Dyann KiefLenze, Michele M, PA-C  pantoprazole (PROTONIX) 40 MG tablet Take 1 tablet (40 mg total) by mouth daily. Patient not taking: Reported on 12/01/2018 02/10/18   Angiulli, Mcarthur Rossettianiel J, PA-C    Family History Family History  Problem Relation Age of Onset  . Cancer Maternal Aunt   . Hyperlipidemia Other   . Diabetes Other   . Heart disease Other     Social History Social History   Tobacco Use  . Smoking status: Never Smoker  .  Smokeless tobacco: Never Used  Substance Use Topics  . Alcohol use: Yes    Comment: Drinks occassionally  . Drug use: No     Allergies   Ivp dye [iodinated diagnostic agents]   Review of Systems Review of Systems  Eyes: Positive for visual disturbance.  All other systems reviewed and are negative.    Physical Exam Updated Vital Signs BP 105/83   Pulse 73   Temp 98.4 F (36.9 C) (Oral)   Resp (!) 22   SpO2 95%   Physical Exam Vitals signs and nursing note reviewed.  Constitutional:      Appearance: Normal appearance.  HENT:     Head: Normocephalic.     Right Ear: Tympanic membrane normal.     Left Ear: Tympanic membrane normal.     Nose: Nose normal.     Mouth/Throat:     Mouth: Mucous membranes are moist.  Eyes:     Extraocular Movements: Extraocular movements intact.     Pupils: Pupils are equal, round,  and reactive to light.     Comments: + L hemianopsia   Neck:     Musculoskeletal: Normal range of motion.  Cardiovascular:     Rate and Rhythm: Normal rate and regular rhythm.     Pulses: Normal pulses.  Pulmonary:     Effort: Pulmonary effort is normal.     Breath sounds: Normal breath sounds.  Abdominal:     General: Abdomen is flat.     Palpations: Abdomen is soft.  Musculoskeletal: Normal range of motion.  Skin:    General: Skin is warm.     Capillary Refill: Capillary refill takes less than 2 seconds.  Neurological:     General: No focal deficit present.     Mental Status: He is alert and oriented to person, place, and time.     Comments: CN 2- 12 intact, nl strength and sensation throughout. Nl finger to nose. No pronator drift.   Psychiatric:        Mood and Affect: Mood normal.      ED Treatments / Results  Labs (all labs ordered are listed, but only abnormal results are displayed) Labs Reviewed  PROTIME-INR - Abnormal; Notable for the following components:      Result Value   Prothrombin Time 16.2 (*)    INR 1.3 (*)    All other  components within normal limits  COMPREHENSIVE METABOLIC PANEL - Abnormal; Notable for the following components:   Glucose, Bld 122 (*)    Total Protein 6.3 (*)    Albumin 3.3 (*)    All other components within normal limits  RAPID URINE DRUG SCREEN, HOSP PERFORMED - Abnormal; Notable for the following components:   Cocaine POSITIVE (*)    All other components within normal limits  URINALYSIS, ROUTINE W REFLEX MICROSCOPIC - Abnormal; Notable for the following components:   Color, Urine STRAW (*)    Hgb urine dipstick SMALL (*)    All other components within normal limits  CBG MONITORING, ED - Abnormal; Notable for the following components:   Glucose-Capillary 142 (*)    All other components within normal limits  I-STAT CHEM 8, ED - Abnormal; Notable for the following components:   BUN 21 (*)    Glucose, Bld 115 (*)    All other components within normal limits  ETHANOL  APTT  CBC  DIFFERENTIAL    EKG EKG Interpretation  Date/Time:  Friday December 01 2018 12:20:46 EDT Ventricular Rate:  71 PR Interval:    QRS Duration: 92 QT Interval:  411 QTC Calculation: 447 R Axis:   -41 Text Interpretation:  Sinus rhythm Multiple ventricular premature complexes Left anterior fascicular block Abnormal R-wave progression, late transition Borderline ST elevation, anterior leads No significant change since last tracing Confirmed by Richardean CanalYao,  H [phone]([id number]) on 12/01/2018 1:13:54 PM   Radiology Mr Brain Wo Contrast  Result Date: 12/01/2018 CLINICAL DATA:  Initial evaluation for acute ataxia, stroke suspected. EXAM: MRI HEAD WITHOUT CONTRAST TECHNIQUE: Multiplanar, multiecho pulse sequences of the brain and surrounding structures were obtained without intravenous contrast. COMPARISON:  None available. FINDINGS: Brain: Generalized age-related cerebral atrophy. Encephalomalacia with gliosis involving the anterior left frontal lobe compatible with remote left MCA territory infarct. Additional  encephalomalacia within the right occipital lobe compatible with chronic right PCA territory infarct. Small amount of chronic hemosiderin staining present within this region. Mild diffusion abnormality seen at this location felt to be related to susceptibility artifact (series 3, image 29). Additional small chronic left cerebellar infarct.  Underlying mild chronic microvascular ischemic disease. No abnormal foci of restricted diffusion to suggest acute or subacute ischemia. Gray-white matter differentiation otherwise maintained. No other evidence for acute or chronic intracranial hemorrhage. No mass lesion, midline shift or mass effect. No hydrocephalus. No extra-axial fluid collection. Pituitary gland suprasellar region normal. Midline structures intact. Vascular: Major intracranial vascular flow voids maintained. Skull and upper cervical spine: Craniocervical junction within normal limits. Upper cervical spine normal. Bone marrow signal intensity within normal limits. No acute scalp soft tissue abnormality. Sinuses/Orbits: Globes and orbital soft tissues within normal limits. Mild scattered mucosal thickening within the ethmoidal air cells and maxillary sinuses. Paranasal sinuses are otherwise clear. No significant mastoid effusion. Inner ear structures grossly normal. Other: None. IMPRESSION: 1. No acute intracranial infarct or other abnormality identified. 2. Chronic left frontal, right occipital, and left cerebellar infarcts. 3. Underlying mild chronic microvascular ischemic disease. Electronically Signed   By: Rise MuBenjamin  McClintock M.D.   On: 12/01/2018 17:14    Procedures Procedures (including critical care time)  Medications Ordered in ED Medications - No data to display   Initial Impression / Assessment and Plan / ED Course  I have reviewed the triage vital signs and the nursing notes.  Pertinent labs & imaging results that were available during my care of the patient were reviewed by me and  considered in my medical decision making (see chart for details).       Barbarann EhlersGary S Shoaf is a 58 y.o. male here with L hemianopsia. Consider cerebellar stroke but symptoms for 2-3 weeks already. I talked to Dr. Jerrell BelfastAurora from neurology. He recommend MRI brain and labs. If MRI showed acute stroke, will need CTA head/neck.   5:43 PM MRI showed chronic L frontal and R occipital and L cerebellar infarcts. I talked to Dr. Wilford CornerArora. He reviewed MRI and recommend outpatient neurology follow up. Patient has cocaine in his UDS but denies using it. Told him to avoid cocaine.    Final Clinical Impressions(s) / ED Diagnoses   Final diagnoses:  None    ED Discharge Orders    None       Charlynne PanderYao,  Hsienta, MD 12/01/18 1744

## 2018-12-01 NOTE — ED Notes (Signed)
Patient transported to MRI 

## 2019-01-08 ENCOUNTER — Encounter: Payer: Self-pay | Admitting: Neurology

## 2019-01-08 ENCOUNTER — Telehealth: Payer: Self-pay | Admitting: *Deleted

## 2019-01-08 ENCOUNTER — Ambulatory Visit: Payer: Medicare Other | Admitting: Neurology

## 2019-01-08 NOTE — Telephone Encounter (Signed)
No showed new patient appointment. 

## 2019-01-24 ENCOUNTER — Other Ambulatory Visit: Payer: Self-pay | Admitting: Vascular Surgery

## 2019-01-24 MED ORDER — RIVAROXABAN 20 MG PO TABS: 20.0000 mg | ORAL_TABLET | Freq: Every day | ORAL | 11 refills | Status: DC

## 2019-02-06 ENCOUNTER — Telehealth: Payer: Self-pay | Admitting: Physician Assistant

## 2019-02-06 NOTE — Telephone Encounter (Signed)
Returned call to patient. He states that he is having trouble affording his Xarelto. He states that it is $47 for 30 tablets. Patient's Xarelto is prescribed by Dr. Oneida Alar for Hx of DVT and PE. Instructed the patient to reach out to Dr. Oneida Alar office as he is the prescriber and I will forward to [facility] to make her aware.

## 2019-02-06 NOTE — Telephone Encounter (Signed)
New Message:   Pt says he can not afford Xarelto, says it is too expensive. He says is there something else he can take, or can he get samples please.

## 2019-02-07 ENCOUNTER — Telehealth: Payer: Self-pay | Admitting: Cardiovascular Disease

## 2019-02-07 ENCOUNTER — Telehealth: Payer: Self-pay | Admitting: Vascular Surgery

## 2019-02-07 NOTE — Telephone Encounter (Addendum)
**Note De-Identified Brigette Hopfer Obfuscation** I called Christine back and she states that Dr Oneida Alar is not in the office today but will be tomorrow and will address switching the pt to a less costing anticoagulant at that time (most likely Coumadin). She is requesting that we give the pt 1 bottle of Xarelto samples until this is taken care of as their office does not have sample medications on hand. I have advised her that will will give him a bottle of Xarelto samples and that I will call him to make him aware.   I did call the pt and he is aware that we are giving him a bottle of Xarelto samples and that Dr Oneida Alar will address changing him to a medication he can afford. He verbalized understanding.

## 2019-02-07 NOTE — Telephone Encounter (Signed)
I tried to call patient and left voice mail.  If he would like to stop by our office, he can pick up a Xarelto savings program card to help with medication costs.   Thurston Hole., LPN

## 2019-02-07 NOTE — Telephone Encounter (Signed)
Wyonia Hough, LPN, I saw where you documented on this pt's medication Xarelto, stating that it had been approved for tier. Pt calling for samples. Could you look at this for me please. Thanks

## 2019-02-07 NOTE — Telephone Encounter (Signed)
New message   Patient calling the office for samples of medication:   1.  What medication and dosage are you requesting samples for?rivaroxaban (XARELTO) 20 MG TABS tablet  2.  Are you currently out of this medication? No, patient has 1 pill left

## 2019-02-08 ENCOUNTER — Telehealth: Payer: Self-pay | Admitting: Cardiovascular Disease

## 2019-02-08 ENCOUNTER — Telehealth: Payer: Self-pay | Admitting: Vascular Surgery

## 2019-02-08 NOTE — Telephone Encounter (Signed)
New Message   Caryl Pina with [facility] is calling on behalf of patient. She states that Dr. Oneida Alar will like to start the patient on Warfarin but they do not have a way of monitoring patients INR. Therefore they would like it down at our office and for the provider to write the script. Please call to discuss.

## 2019-02-08 NOTE — Telephone Encounter (Signed)
-----   Message from Elam Dutch, MD sent at 02/08/2019  8:24 AM EDT ----- Regarding: RE: Xarelto Ok to switch to warfarin from my standpoint but will need to make sure his cardiologist or primary MD is willing to take this on and check INR  [name]  ----- Message ----- From: Kaleen Mask, LPN Sent: 08/25/9700   2:28 PM EDT To: Elam Dutch, MD Subject: Michael [name] Dr. Oneida Alar.  This patient has had a very hard time getting Xarelto at a price he can afford (Even with the discount cards).  He is on his last pill and has requested samples from his Cardiologist.  He wants to know if there is another medication he can try?  He says even if it is Coumadin, if less expensive he is willing to try it.  Please advise.  Thanks,   Thurston Hole., LPN

## 2019-02-08 NOTE — Telephone Encounter (Signed)
Reviewed with Antoine Primas, Pharm D in coumadin clinic who requests we clarify with Dr. Oneida Alar the planned duration of anticoagulant therapy.  If pt to be on anticoagulant therapy indefinitely this can be monitored in our coumadin clinic.

## 2019-02-08 NOTE — Telephone Encounter (Signed)
Per Dr. Oneida Alar, patient will need life-time Coumadin Therapy.  Thurston Hole., LPN

## 2019-02-08 NOTE — Telephone Encounter (Signed)
Follow Up   Dr.  Ruta Hinds is changing patient's medication from Xarelto  To Warfarin. Caryl Pina from his office is calling in to get an order in the system to have his INR checked regularly since patient is going to be taking Warfarin.

## 2019-02-14 ENCOUNTER — Telehealth: Payer: Self-pay | Admitting: *Deleted

## 2019-02-14 NOTE — Telephone Encounter (Signed)
Kaleen Mask, LPN     8/86/48 4:72 PM Note   Per Dr. Oneida Alar, patient will need life-time Coumadin Therapy.  Thurston Hole., LPN

## 2019-02-14 NOTE — Telephone Encounter (Addendum)
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 Plan is for lifelong anticoagulation per Dr Oneida Alar. Cost of Xarelto is now prohibitive.  Left message for pt to discuss changing from Xarelto to warfarin due to cost. Will ask if pt has prescription insurance as well (he does have a Medicaid card in [place] from 2017 - DOAC would be affordable if he still had this insurance).

## 2019-02-14 NOTE — Telephone Encounter (Signed)
Medical Assistant left message on patient's home and cell voicemail. Voicemail states to give a call back to Nubia with [facility] at [phone]. !!!Please schedule a Nurse or CPP appointment for vaccines!!! 

## 2019-02-14 NOTE — Telephone Encounter (Signed)
See other phone note dated 8/27

## 2019-02-20 ENCOUNTER — Telehealth: Payer: Self-pay | Admitting: Cardiovascular Disease

## 2019-02-20 NOTE — Telephone Encounter (Signed)
° °  Dr Oneida Alar office returned call, states they do not have samples of Xarelto. They have referred patient to [facility]. Requesting we provide samples to patient or make medication change to Warfarin Please see Pharm D telephone encounter from 8/27  Please advise patient

## 2019-02-20 NOTE — Telephone Encounter (Signed)
Patient calling the office for samples of medication:   1.  What medication and dosage are you requesting samples for? XARELTO 20mg   2.  Are you currently out of this medication? yes

## 2019-02-20 NOTE — Telephone Encounter (Signed)
Called pt and left another message to discuss. See other phone encounter 8/27 - have already left message for pt to discuss and he did not return call last week.

## 2019-02-20 NOTE — Telephone Encounter (Signed)
Called pt to inform him that Dr. Oneida Alar handles his medication Xarelto and that he needed to give their office a call because Dr. Oneida Alar was discussing starting pt on Warfarin. Pt verbalized understanding.

## 2019-02-21 ENCOUNTER — Other Ambulatory Visit: Payer: Self-pay | Admitting: Cardiovascular Disease

## 2019-02-21 MED ORDER — WARFARIN SODIUM 5 MG PO TABS: 5.0000 mg | ORAL_TABLET | Freq: Every day | ORAL | 0 refills | Status: DC

## 2019-02-21 NOTE — Telephone Encounter (Addendum)
Called pt again - he is agreeable to starting warfarin 5mg  1 tablet each day. New Coumadin appt scheduled for 9/15. States he already ran out of Xarelto so unable to overlap the two - it has been at least a year since his last VTE.

## 2019-02-22 NOTE — Telephone Encounter (Signed)
A request for Warfarin 5mg  received; this rx was sent on 02/21/2019 since pt is stopping Xarelto and started Warfarin. Until we know what dose the pt will be on we will not grant more than the amount we sent in on 02/21/2019, pt's first appt is on 02/27/2019. Xarelto has already been taken off the pt's med list.

## 2019-02-23 ENCOUNTER — Telehealth: Payer: Self-pay | Admitting: Cardiovascular Disease

## 2019-02-23 NOTE — Telephone Encounter (Signed)
Patient states he cannot afford Xarelto. We have already submitted a tier exception for patient to reduce his cost. Lyn is there any other options for this patient?  Could try switching to brand Jantoven, but doubt that will make any difference.  Will route to Dr. Angelena Form for input

## 2019-02-23 NOTE — Telephone Encounter (Signed)
  Pt c/o medication issue:  1. Name of Medication: warfarin (COUMADIN) 5 MG tablet  2. How are you currently taking this medication (dosage and times per day)? Take 1 tablet (5 mg total) by mouth daily.  3. Are you having a reaction (difficulty breathing--STAT)? Nausea and emesis  4. What is your medication issue? Patient states he has had stomach pains, nausea and emesis since starting this medication.

## 2019-02-23 NOTE — Telephone Encounter (Signed)
Spoke with patient who states he threw up yesterday after taking warfarin. He felt nauseated this AM but has been feeling better now that he is up and moving around.  Advised patient to try to continue to take as warfarin is our only option currently. Hopefully this was not medication related or his body becomes more adjusted to it. Patient agreeable to continue. He has appointment in coumadin clinic on Tuesday. He states he may be able to afford Xarelto in November.

## 2019-03-14 ENCOUNTER — Ambulatory Visit (INDEPENDENT_AMBULATORY_CARE_PROVIDER_SITE_OTHER): Payer: Medicare Other

## 2019-03-14 ENCOUNTER — Ambulatory Visit: Payer: Medicare Other | Admitting: Orthopaedic Surgery

## 2019-03-14 ENCOUNTER — Other Ambulatory Visit: Payer: Self-pay

## 2019-03-14 ENCOUNTER — Encounter (INDEPENDENT_AMBULATORY_CARE_PROVIDER_SITE_OTHER): Payer: Self-pay

## 2019-03-14 DIAGNOSIS — I82409 Acute embolism and thrombosis of unspecified deep veins of unspecified lower extremity: Secondary | ICD-10-CM | POA: Insufficient documentation

## 2019-03-14 DIAGNOSIS — Z5181 Encounter for therapeutic drug level monitoring: Secondary | ICD-10-CM | POA: Diagnosis not present

## 2019-03-14 DIAGNOSIS — I824Y1 Acute embolism and thrombosis of unspecified deep veins of right proximal lower extremity: Secondary | ICD-10-CM

## 2019-03-14 DIAGNOSIS — I2699 Other pulmonary embolism without acute cor pulmonale: Secondary | ICD-10-CM

## 2019-03-14 LAB — POCT INR: INR: 1.5 — AB (ref 2.0–3.0)

## 2019-03-14 MED ORDER — WARFARIN SODIUM 5 MG PO TABS: ORAL_TABLET | ORAL | 1 refills | Status: DC

## 2019-03-14 NOTE — Patient Instructions (Addendum)
Description   Take 1.5 tablets today, then start taking 1 tablet daily except 1.5 tablets on Mondays and Fridays. Recheck INR in 1 week. Coumadin Clinic [phone] Main [phone]   A full discussion of the nature of anticoagulants has been carried out.  A benefit risk analysis has been presented to the patient, so that they understand the justification for choosing anticoagulation at this time. The need for frequent and regular monitoring, precise dosage adjustment and compliance is stressed.  Side effects of potential bleeding are discussed.  The patient should avoid any OTC items containing aspirin or ibuprofen, and should avoid great swings in general diet.  Avoid alcohol consumption.  Call if any signs of abnormal bleeding.  You will have weekly INR checks as a new pt and adher to appointments.

## 2019-03-15 ENCOUNTER — Telehealth: Payer: Self-pay | Admitting: *Deleted

## 2019-03-15 NOTE — Telephone Encounter (Signed)
He had a new patient appt on 03/22/2019 at 2pm with Dr. Krista Blue.  She has to be in a meeting and we need to reschedule him.  I have called him twice without success.  I left a detailed message that we had to cancel the appt on 03/22/2019.  Provided him with our number to call back so that we can offer him a new time.    When he calls back, please offer him another appt time.  He is new to our office so he can be seen by Dr. Krista Blue or another appropriate provider.  For the remainder of the month of October, Dr. Krista Blue is allowing the open Botox slots to be scheduled with new patients so this will help him get in quickly.

## 2019-03-20 ENCOUNTER — Ambulatory Visit: Payer: Medicare Other | Admitting: Orthopaedic Surgery

## 2019-03-22 ENCOUNTER — Ambulatory Visit: Payer: Medicare Other | Admitting: Neurology

## 2019-03-28 ENCOUNTER — Ambulatory Visit: Payer: Medicare Other | Admitting: Physician Assistant

## 2019-03-30 ENCOUNTER — Other Ambulatory Visit: Payer: Self-pay | Admitting: Physician Assistant

## 2019-03-30 NOTE — Telephone Encounter (Signed)
°*  STAT* If patient is at the pharmacy, call can be transferred to refill team.   1. Which medications need to be refilled? (please list name of each medication and dose if known) lisinopril (PRINIVIL,ZESTRIL) 10 MG tablet  2. Which pharmacy/location (including street and city if local pharmacy) is medication to be sent to? [place], [address]  3. Do they need a 30 day or 90 day supply? 90 day  Patient is out of medication

## 2019-04-04 ENCOUNTER — Other Ambulatory Visit: Payer: Self-pay

## 2019-04-04 ENCOUNTER — Encounter (INDEPENDENT_AMBULATORY_CARE_PROVIDER_SITE_OTHER): Payer: Self-pay

## 2019-04-04 ENCOUNTER — Ambulatory Visit (INDEPENDENT_AMBULATORY_CARE_PROVIDER_SITE_OTHER): Payer: Medicare Other

## 2019-04-04 DIAGNOSIS — Z5181 Encounter for therapeutic drug level monitoring: Secondary | ICD-10-CM | POA: Diagnosis not present

## 2019-04-04 DIAGNOSIS — I824Y1 Acute embolism and thrombosis of unspecified deep veins of right proximal lower extremity: Secondary | ICD-10-CM

## 2019-04-04 DIAGNOSIS — I2699 Other pulmonary embolism without acute cor pulmonale: Secondary | ICD-10-CM | POA: Diagnosis not present

## 2019-04-04 LAB — POCT INR: INR: 1.1 — AB (ref 2.0–3.0)

## 2019-04-04 NOTE — Patient Instructions (Signed)
Description   Take 2 tablets today, then start taking 1 tablet daily except 1.5 tablets on Mondays, Wednesdays and Fridays. Recheck INR in 1 week. Coumadin Clinic [phone] Main [phone]

## 2019-06-14 ENCOUNTER — Ambulatory Visit: Payer: Medicare Other | Admitting: Cardiovascular Disease

## 2019-06-14 NOTE — Progress Notes (Deleted)
No chief complaint on file.   History of Present Illness: 58 yo male with history of CAD and HTN here today for cardiac follow up. He was admitted to [facility] 03/[facility]/11 with an anterior STEMI with occluded LAD, treated with placement of a drug eluting stent in the proximal LAD. Diffuse distal disease in the right posterolateral branch. The third OM was occluded and filled from left to left collaterals. He had a severe dye reaction. LV function was preserved by echo.  In August 2019, he had acute on chronic ischemia with severe tibial artery occlusive disease bilaterally right popliteal and tibial embolectomy with left femoral to below the knee popliteal bypass and left popliteal and tibial endarterectomy.  Also had acute right leg DVT and bilateral pulmonary emboli at that time with Xarelto. He has been followed in the [facility] by Dr. Oneida Alar and is now on Coumadin. He was found to have evidence of a prior stroke in June 2020 by brain MRI. Has been followed in Neurology.   He is here today for follow up. The patient denies any chest pain, dyspnea, palpitations, lower extremity edema, orthopnea, PND, dizziness, near syncope or syncope.     Primary Care Physician: Ladell Pier, MD   Past Medical History:  Diagnosis Date  . Alcohol abuse   . Anterior myocardial infarction [facility])    ST-elevation; S/P emergent  drug-eluting stenting of proximal left anterior descending  . Coronary artery disease    a. STEMI 2012 - s/p DES to LAD with  diffuse distal disease in the right posterolateral branch. The third OM was occluded and filled from left to left collaterals. LVEF preserved.  . Dysuria 04/26/2013  . Essential hypertension 05/16/2014  . Frequent urination 09/03/2014  . HNP (herniated nucleus pulposus), cervical 10/14/2014  . Hyperlipidemia   . Hypertension   . Ischemia of extremity 01/23/2018  . Lower abdominal pain 09/03/2014  . PAD (peripheral artery disease) ([place]) 08/11/2016  . PAD (peripheral  artery disease) ([place]) 08/11/2016  . Pre-diabetes   . Renal insufficiency   . Special screening for malignant neoplasms, colon     Past Surgical History:  Procedure Laterality Date  . COLONOSCOPY N/A 08/16/2014   Procedure: COLONOSCOPY;  Surgeon: Danie Binder, MD;  [facility];  Service: Endoscopy;  Laterality: N/A;  10:15 AM  . DOPPLER ECHOCARDIOGRAPHY     Preserved left venticular function  . EMBOLECTOMY Right 01/23/2018   Procedure: RIGHT POPLITEAL-TIBIAL  ARTERY EMBOLECTOMY, EXPLORATION OF RIGHT ANTERIOR TIBIAL ARTERY, RIGHT POPLITEAL ARTERY VEIN ANGIOPLASTY;  Surgeon: Elam Dutch, MD;  Location: [place];  Service: Vascular;  Laterality: Right;  . ENDARTERECTOMY POPLITEAL Left 01/23/2018   Procedure: ENDARTERECTOMY OF LEFT POPLITEAL ARTERY AND TIBIAL-PERONEAL TRUNK;  Surgeon: Elam Dutch, MD;  Location: [place];  Service: Vascular;  Laterality: Left;  . FEMORAL-POPLITEAL BYPASS GRAFT Left 01/23/2018   Procedure: BYPASS GRAFT LEFT FEMORAL-POPLITEAL ARTERY WITH PROPATEN VASCULAR GRAFT;  Surgeon: Elam Dutch, MD;  Location: [place];  Service: Vascular;  Laterality: Left;  . HEMATOMA EVACUATION Left 01/25/2018   Procedure: EVACUATION HEMATOMA LEFT POPLITEAL SPACE;  Surgeon: Marty Heck, MD;  Location: [place];  Service: Vascular;  Laterality: Left;  . LEG SURGERY     left leg - has rod and 4 pins  . PATELLAR TENDON REPAIR Right 04/26/2014   Procedure: RIGHT PATELLA TENDON REPAIR;  Surgeon: Renette Butters, MD;  Location: [place];  Service: Orthopedics;  Laterality: Right;  Allergies  Allergen Reactions  . Ivp Dye [Iodinated Diagnostic Agents] Swelling    Social History   Socioeconomic History  . Marital status: Significant Other    Spouse name: Not on file  . Number of children: 0  . Years of education: Not on file  . Highest education level: Not on file  Occupational History  . Not on file  Tobacco Use  . Smoking status: Never  Smoker  . Smokeless tobacco: Never Used  Substance and Sexual Activity  . Alcohol use: Yes    Comment: Drinks occassionally  . Drug use: No  . Sexual activity: Not on file  Other Topics Concern  . Not on file  Social History Narrative  . Not on file   Social Determinants of Health   Financial Resource Strain:   . Difficulty of Paying Living Expenses: Not on file  Food Insecurity:   . Worried About Programme researcher, broadcasting/film/videounning Out of Food in the Last Year: Not on file  . Ran Out of Food in the Last Year: Not on file  Transportation Needs:   . Lack of Transportation (Medical): Not on file  . Lack of Transportation (Non-Medical): Not on file  Physical Activity:   . Days of Exercise per Week: Not on file  . Minutes of Exercise per Session: Not on file  Stress:   . Feeling of Stress : Not on file  Social Connections:   . Frequency of Communication with Friends and Family: Not on file  . Frequency of Social Gatherings with Friends and Family: Not on file  . Attends Religious Services: Not on file  . Active Member of Clubs or Organizations: Not on file  . Attends BankerClub or Organization Meetings: Not on file  . Marital Status: Not on file  Intimate Partner Violence:   . Fear of Current or Ex-Partner: Not on file  . Emotionally Abused: Not on file  . Physically Abused: Not on file  . Sexually Abused: Not on file    Family History  Problem Relation Age of Onset  . Cancer Maternal Aunt   . Hyperlipidemia Other   . Diabetes Other   . Heart disease Other    Meds:  Current Outpatient Medications on File Prior to Visit  Medication Sig Dispense Refill  . lisinopril (ZESTRIL) 10 MG tablet Take 1 tablet by mouth once daily 90 tablet 1  . metoprolol tartrate (LOPRESSOR) 25 MG tablet Take 1 tablet (25 mg total) by mouth 2 (two) times daily. 180 tablet 3  . nitroGLYCERIN (NITROSTAT) 0.4 MG SL tablet Place 1 tablet (0.4 mg total) under the tongue every 5 (five) minutes as needed for chest pain. 25 tablet 4   . pantoprazole (PROTONIX) 40 MG tablet Take 1 tablet (40 mg total) by mouth daily. (Patient not taking: Reported on 12/01/2018) 30 tablet 0  . rosuvastatin (CRESTOR) 40 MG tablet Take 1 tablet (40 mg total) by mouth daily. 90 tablet 3  . warfarin (COUMADIN) 5 MG tablet Take as directed by Coumadin Clinic 45 tablet 1   No current facility-administered medications on file prior to visit.   Review of Systems:  As stated in the HPI and otherwise negative.   There were no vitals taken for this visit.  Physical Examination: General: Well developed, well nourished, NAD  HEENT: OP clear, mucus membranes moist  SKIN: warm, dry. No rashes. Neuro: No focal deficits  Musculoskeletal: Muscle strength 5/5 all ext  Psychiatric: Mood and affect normal  Neck: No JVD, no carotid  bruits, no thyromegaly, no lymphadenopathy.  Lungs:Clear bilaterally, no wheezes, rhonci, crackles Cardiovascular: Regular rate and rhythm. No murmurs, gallops or rubs. Abdomen:Soft. Bowel sounds present. Non-tender.  Extremities: No lower extremity edema. Pulses are 2 + in the bilateral DP/PT.  Cardiac cath 03/31/11: 1. Right coronary artery: The vessel is large in size and dominant.  There is a mild 20% disease in the mid segment with minor luminal  irregularities distally. The PDA is normal in size and free of  significant disease. First posterolateral branch is normal in size  with 40% proximal disease. The second posterolateral branch is  relatively large in size with 50% disease at the ostium followed by  a 70% lesion proximally and diffuse 80% disease distally. The RCA  gives collaterals to the left anterior descending artery as well as  OM-3.  2. Left main coronary artery: The vessel is normal in size without  significant disease.  3. Left anterior descending artery: The vessel is normal in size and  is occluded proximally a small septal branch. The distal LAD does  not have significant disease. The first diagonal  branch is normal  to large in size and has 2 branches. The superior branch is  overall normal. The inferior branch has a 90% proximal disease.  4. Left circumflex artery: The vessel is normal in size and  nondominant. The first obtuse marginal is normal in size with a  mild proximal disease. The second OM is normal in size with no  significant disease. Third OM appears to be occluded with  collaterals supplied from the distal LAD and the right coronary  Artery.  EKG:  EKG is *** ordered today. The ekg ordered today demonstrates   Recent Labs: 12/01/2018: ALT 25; BUN 21; Creatinine, Ser 1.20; Hemoglobin 15.3; Platelets 204; Potassium 3.9; Sodium 137   Lipid Panel    Component Value Date/Time   CHOL 133 08/11/2016 1030   TRIG 96 08/11/2016 1030   HDL 37 (L) 08/11/2016 1030   CHOLHDL 3.6 08/11/2016 1030   VLDL 19 08/11/2016 1030   LDLCALC 77 08/11/2016 1030   LDLDIRECT 164.1 05/04/2013 1107     Wt Readings from Last 3 Encounters:  11/23/18 212 lb 12.8 oz (96.5 kg)  09/13/18 212 lb (96.2 kg)  03/09/18 208 lb (94.3 kg)     Other studies Reviewed: Additional studies/ records that were reviewed today include: . Review of the above records demonstrates:    Assessment and Plan:   1. CAD without angina: No chest pain. Continue statin and beta blocker. He has been off of ASA since being placed on long term anti-coagulation.    2. HTN: BP is controlled.   3. HLD: Continue statin  4. PAD: Followed in [facility]  5. 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 Fields.   Current medicines are reviewed at length with the patient today.  The patient does not have concerns regarding medicines.  The following changes have been made:  no change  Labs/ tests ordered today include:   No orders of the defined types were placed in this encounter.   Disposition:   FU with me in 6 months  Signed, Verne Carrow, MD 06/14/2019 7:04 AM    [facility] [address] [place], [place], [place]  [address] Phone: [phone]; Fax: [phone]

## 2019-06-16 ENCOUNTER — Other Ambulatory Visit: Payer: Self-pay | Admitting: Cardiovascular Disease

## 2019-06-18 NOTE — Telephone Encounter (Signed)
Pt last seen 04/04/2019 and did not return for 1 week follow up appt as his INR was 1.1 at that time. Called pt to set up an appt and had to leave a message for the pt to call back [phone] or [phone] to schedule an appt.

## 2019-06-18 NOTE — Telephone Encounter (Signed)
Pt has an appt to see Dr Clifton James on 06/21/19 at 3:20pm, made pt an appt to check his Warfarin at 3:00pm that same day.  Pt states he is totally out of Warfarin and missed yesterday's dosage.  Pt's normal dosage is 1 tablet QD except 1.5 tablets on MWF.  Advised pt I would sent him in 5 tablets to get him to his appt, then we would refill his Warfarin for 30 days at his INR check appt.  Pt states he is going to discuss switching back to Xarelto with Dr Clifton James at [facility].  Pt has been non-compliant with f/u INR checks so this is probably best.  Last INR was 1.1 on 04/04/19. Will send in refill for 5 tablets to [facility] on [address] per pt request.

## 2019-06-21 ENCOUNTER — Other Ambulatory Visit: Payer: Self-pay

## 2019-06-21 ENCOUNTER — Ambulatory Visit (INDEPENDENT_AMBULATORY_CARE_PROVIDER_SITE_OTHER): Payer: Medicare Other | Admitting: Cardiovascular Disease

## 2019-06-21 ENCOUNTER — Telehealth: Payer: Self-pay | Admitting: Radiology

## 2019-06-21 ENCOUNTER — Ambulatory Visit (INDEPENDENT_AMBULATORY_CARE_PROVIDER_SITE_OTHER): Payer: Medicare Other | Admitting: Pharmacist

## 2019-06-21 ENCOUNTER — Telehealth: Payer: Self-pay

## 2019-06-21 ENCOUNTER — Encounter: Payer: Self-pay | Admitting: Cardiovascular Disease

## 2019-06-21 VITALS — BP 132/76 | HR 69 | Ht 69.0 in | Wt 226.8 lb

## 2019-06-21 DIAGNOSIS — I10 Essential (primary) hypertension: Secondary | ICD-10-CM

## 2019-06-21 DIAGNOSIS — E785 Hyperlipidemia, unspecified: Secondary | ICD-10-CM | POA: Diagnosis not present

## 2019-06-21 DIAGNOSIS — I2510 Atherosclerotic heart disease of native coronary artery without angina pectoris: Secondary | ICD-10-CM

## 2019-06-21 DIAGNOSIS — Z5181 Encounter for therapeutic drug level monitoring: Secondary | ICD-10-CM | POA: Diagnosis not present

## 2019-06-21 DIAGNOSIS — R002 Palpitations: Secondary | ICD-10-CM | POA: Diagnosis not present

## 2019-06-21 DIAGNOSIS — I824Y1 Acute embolism and thrombosis of unspecified deep veins of right proximal lower extremity: Secondary | ICD-10-CM | POA: Diagnosis not present

## 2019-06-21 DIAGNOSIS — I2699 Other pulmonary embolism without acute cor pulmonale: Secondary | ICD-10-CM | POA: Diagnosis not present

## 2019-06-21 LAB — POCT INR: INR: 1.8 — AB (ref 2.0–3.0)

## 2019-06-21 MED ORDER — WARFARIN SODIUM 5 MG PO TABS: ORAL_TABLET | ORAL | 0 refills | Status: DC

## 2019-06-21 MED ORDER — ASPIRIN EC 81 MG PO TBEC: 81.0000 mg | DELAYED_RELEASE_TABLET | Freq: Every day | ORAL | 3 refills | Status: AC

## 2019-06-21 NOTE — Patient Instructions (Signed)
Medication Instructions:  No changes *If you need a refill on your cardiac medications before your next appointment, please call your pharmacy*  Lab Work: none If you have labs (blood work) drawn today and your tests are completely normal, you will receive your results only by: Marland Kitchen MyChart Message (if you have MyChart) OR . A paper copy in the mail If you have any lab test that is abnormal or we need to change your treatment, we will call you to review the results.  Testing/Procedures: Your physician has recommended that you wear a Zio heart monitor. Heart monitors are medical devices that record the heart's electrical activity. Doctors most often use these monitors to diagnose arrhythmias. Arrhythmias are problems with the speed or rhythm of the heartbeat. The monitor is a small, portable device. You can wear one while you do your normal daily activities. This is usually used to diagnose what is causing palpitations/syncope (passing out).   Follow-Up: At [facility], you and your health needs are our priority.  As part of our continuing mission to provide you with exceptional heart care, we have created designated Provider Care Teams.  These Care Teams include your primary Cardiologist (physician) and Advanced Practice Providers (APPs -  Physician Assistants and Nurse Practitioners) who all work together to provide you with the care you need, when you need it.  Your next appointment:   6 month(s)  The format for your next appointment:   Either In Person or Virtual  Provider:   You may see Verne Carrow, MD or one of the following Advanced Practice Providers on your designated Care Team:    Ronie Spies, PA-C  Jacolyn Reedy, PA-C   Other Instructions

## 2019-06-21 NOTE — Telephone Encounter (Signed)
Enrolled patient for a 3 day Zio monitor to be mailed to patients home.  

## 2019-06-21 NOTE — Patient Instructions (Signed)
Description   Take 1.5 tablets today, then continue taking 1 tablet daily except 1.5 tablets on Mondays, Wednesdays and Fridays. Recheck INR February 1 - planning to change back to Xarelto then. Coumadin Clinic [phone] Main [phone]

## 2019-06-21 NOTE — Telephone Encounter (Signed)
Pt. Called requesting an appt for right leg swelling. He stated he has appts today with other doctors regarding his Warfarin and is trying to switch back onto Xarelto, since he has new insurance. Pt given an appt with PA on 07/23/19. I explained the signs of a blood clot and explained to him he should go to the ED if he can not get an appt and feels he has a blood clot. Pt. Verbalized understanding.

## 2019-06-21 NOTE — Progress Notes (Signed)
Chief Complaint  Patient presents with  . Follow-up    CAD   History of Present Illness: 58 yo AAM with history of CAD, HTN here today for cardiac follow up. He was admitted to [facility] 03/31/11 with anterior STEMI with occluded LAD now s/p DES x 1 proximal LAD. His cath was performed by Dr. Kirke Corin. Diffuse distal disease in the right posterolateral branch. The third OM was occluded and filled from left to left collaterals. Proximal LAD occlusion treated with 2.75 x 24 mm Promus Element DES x 1. He had a severe dye reaction. LV function was preserved by echo.  In August 2019 he had acute on chronic ischemia with severe tibial artery occlusive disease bilaterally leading to a right popliteal and tibial embolectomy with left femoral to below the knee popliteal bypass and left popliteal and tibial endarterectomy.  Also had acute right leg DVT and bilateral pulmonary emboli at that time. He has been on coumadin.   He is here today for follow up. The patient denies any chest pain, dyspnea, lower extremity edema, orthopnea, PND, dizziness, near syncope or syncope. He has been feeling his heart skip beats. No exertional chest pain.   Primary Care Physician: Marcine Matar, MD  Past Medical History:  Diagnosis Date  . Alcohol abuse   . Anterior myocardial infarction [facility])    ST-elevation; S/P emergent  drug-eluting stenting of proximal left anterior descending  . Coronary artery disease    a. STEMI 2012 - s/p DES to LAD with  diffuse distal disease in the right posterolateral branch. The third OM was occluded and filled from left to left collaterals. LVEF preserved.  . Dysuria 04/26/2013  . Essential hypertension 05/16/2014  . Frequent urination 09/03/2014  . HNP (herniated nucleus pulposus), cervical 10/14/2014  . Hyperlipidemia   . Hypertension   . Ischemia of extremity 01/23/2018  . Lower abdominal pain 09/03/2014  . PAD (peripheral artery disease) (HCC) 08/11/2016  . PAD (peripheral artery  disease) (HCC) 08/11/2016  . Pre-diabetes   . Renal insufficiency   . Special screening for malignant neoplasms, colon     Past Surgical History:  Procedure Laterality Date  . COLONOSCOPY N/A 08/16/2014   Procedure: COLONOSCOPY;  Surgeon: West Bali, MD;  [facility];  Service: Endoscopy;  Laterality: N/A;  10:15 AM  . DOPPLER ECHOCARDIOGRAPHY     Preserved left venticular function  . EMBOLECTOMY Right 01/23/2018   Procedure: RIGHT POPLITEAL-TIBIAL  ARTERY EMBOLECTOMY, EXPLORATION OF RIGHT ANTERIOR TIBIAL ARTERY, RIGHT POPLITEAL ARTERY VEIN ANGIOPLASTY;  Surgeon: Sherren Kerns, MD;  Location: [facility] OR;  Service: Vascular;  Laterality: Right;  . ENDARTERECTOMY POPLITEAL Left 01/23/2018   Procedure: ENDARTERECTOMY OF LEFT POPLITEAL ARTERY AND TIBIAL-PERONEAL TRUNK;  Surgeon: Sherren Kerns, MD;  Location: [facility] OR;  Service: Vascular;  Laterality: Left;  . FEMORAL-POPLITEAL BYPASS GRAFT Left 01/23/2018   Procedure: BYPASS GRAFT LEFT FEMORAL-POPLITEAL ARTERY WITH PROPATEN VASCULAR GRAFT;  Surgeon: Sherren Kerns, MD;  Location: [facility] OR;  Service: Vascular;  Laterality: Left;  . HEMATOMA EVACUATION Left 01/25/2018   Procedure: EVACUATION HEMATOMA LEFT POPLITEAL SPACE;  Surgeon: Cephus Shelling, MD;  Location: [facility] OR;  Service: Vascular;  Laterality: Left;  . LEG SURGERY     left leg - has rod and 4 pins  . PATELLAR TENDON REPAIR Right 04/26/2014   Procedure: RIGHT PATELLA TENDON REPAIR;  Surgeon: Sheral Apley, MD;  Location: [facility];  Service: Orthopedics;  Laterality: Right;  Allergies  Allergen Reactions  . Ivp Dye [Iodinated Diagnostic Agents] Swelling    Social History   Socioeconomic History  . Marital status: Significant Other    Spouse name: Not on file  . Number of children: 0  . Years of education: Not on file  . Highest education level: Not on file  Occupational History  . Not on file  Tobacco Use  . Smoking status: Never Smoker    . Smokeless tobacco: Never Used  Substance and Sexual Activity  . Alcohol use: Yes    Comment: Drinks occassionally  . Drug use: No  . Sexual activity: Not on file  Other Topics Concern  . Not on file  Social History Narrative  . Not on file   Social Determinants of Health   Financial Resource Strain:   . Difficulty of Paying Living Expenses: Not on file  Food Insecurity:   . Worried About Programme researcher, broadcasting/film/video in the Last Year: Not on file  . Ran Out of Food in the Last Year: Not on file  Transportation Needs:   . Lack of Transportation (Medical): Not on file  . Lack of Transportation (Non-Medical): Not on file  Physical Activity:   . Days of Exercise per Week: Not on file  . Minutes of Exercise per Session: Not on file  Stress:   . Feeling of Stress : Not on file  Social Connections:   . Frequency of Communication with Friends and Family: Not on file  . Frequency of Social Gatherings with Friends and Family: Not on file  . Attends Religious Services: Not on file  . Active Member of Clubs or Organizations: Not on file  . Attends Banker Meetings: Not on file  . Marital Status: Not on file  Intimate Partner Violence:   . Fear of Current or Ex-Partner: Not on file  . Emotionally Abused: Not on file  . Physically Abused: Not on file  . Sexually Abused: Not on file    Family History  Problem Relation Age of Onset  . Cancer Maternal Aunt   . Hyperlipidemia Other   . Diabetes Other   . Heart disease Other    Meds:  Current Outpatient Medications on File Prior to Visit  Medication Sig Dispense Refill  . lisinopril (ZESTRIL) 10 MG tablet Take 1 tablet by mouth once daily 90 tablet 1  . metoprolol tartrate (LOPRESSOR) 25 MG tablet Take 1 tablet (25 mg total) by mouth 2 (two) times daily. 180 tablet 3  . nitroGLYCERIN (NITROSTAT) 0.4 MG SL tablet Place 1 tablet (0.4 mg total) under the tongue every 5 (five) minutes as needed for chest pain. 25 tablet 4  .  rosuvastatin (CRESTOR) 40 MG tablet Take 1 tablet (40 mg total) by mouth daily. 90 tablet 3   No current facility-administered medications on file prior to visit.   Review of Systems:  As stated in the HPI and otherwise negative.   BP 132/76   Pulse 69   Ht 5\' 9"  (1.753 m)   Wt 226 lb 12.8 oz (102.9 kg)   SpO2 99%   BMI 33.49 kg/m   Physical Examination: General: Well developed, well nourished, NAD  HEENT: OP clear, mucus membranes moist  SKIN: warm, dry. No rashes. Neuro: No focal deficits  Musculoskeletal: Muscle strength 5/5 all ext  Psychiatric: Mood and affect normal  Neck: No JVD, no carotid bruits, no thyromegaly, no lymphadenopathy.  Lungs:Clear bilaterally, no wheezes, rhonci, crackles Cardiovascular: Regular rate and  rhythm. No murmurs, gallops or rubs. Abdomen:Soft. Bowel sounds present. Non-tender.  Extremities: No lower extremity edema. Pulses are 2 + in the bilateral DP/PT.  EKG:  EKG is not ordered today. The ekg ordered today demonstrates   Recent Labs: 12/01/2018: ALT 25; BUN 21; Creatinine, Ser 1.20; Hemoglobin 15.3; Platelets 204; Potassium 3.9; Sodium 137   Lipid Panel    Component Value Date/Time   CHOL 133 08/11/2016 1030   TRIG 96 08/11/2016 1030   HDL 37 (L) 08/11/2016 1030   CHOLHDL 3.6 08/11/2016 1030   VLDL 19 08/11/2016 1030   LDLCALC 77 08/11/2016 1030   LDLDIRECT 164.1 05/04/2013 1107     Wt Readings from Last 3 Encounters:  06/21/19 226 lb 12.8 oz (102.9 kg)  11/23/18 212 lb 12.8 oz (96.5 kg)  09/13/18 212 lb (96.2 kg)     Other studies Reviewed: Additional studies/ records that were reviewed today include: . Review of the above records demonstrates:    Assessment and Plan:   1. CAD without angina: He has no chest pain. He initially presented with an anterior STEMI 03/31/11. A DES was placed in LAD. Moderate disease in other vessels. Will continue ASA, statin and beta blocker.    2. HTN: BP is controlled. No changes  3. HLD:  Lipids followed in primary care. Continue statin  4. Palpitations: Will arrange 3 day zio patch cardiac monitor  5. Lower extremity DVT: He is on coumadin. He wants to change back to Xarelto. This will be done in the coumadin clinic in February. Dr. Oneida Alar is following his DVT.   Current medicines are reviewed at length with the patient today.  The patient does not have concerns regarding medicines.  The following changes have been made:  no change  Labs/ tests ordered today include:   Orders Placed This Encounter  Procedures  . LONG TERM MONITOR (3-14 DAYS)    Disposition:   FU with me in 6 months  Signed, Lauree Chandler, MD 06/21/2019 4:54 PM    [facility] [address], [place], [place]  [address] Phone: [phone]; Fax: [phone]

## 2019-06-29 ENCOUNTER — Other Ambulatory Visit (INDEPENDENT_AMBULATORY_CARE_PROVIDER_SITE_OTHER): Payer: Medicare Other

## 2019-06-29 DIAGNOSIS — R002 Palpitations: Secondary | ICD-10-CM

## 2019-07-16 ENCOUNTER — Other Ambulatory Visit: Payer: Self-pay | Admitting: Cardiology

## 2019-07-16 DIAGNOSIS — Z20822 Contact with and (suspected) exposure to covid-19: Secondary | ICD-10-CM

## 2019-07-17 ENCOUNTER — Other Ambulatory Visit: Payer: Self-pay

## 2019-07-17 LAB — NOVEL CORONAVIRUS, NAA: SARS-CoV-2, NAA: NOT DETECTED

## 2019-07-17 MED ORDER — WARFARIN SODIUM 5 MG PO TABS: ORAL_TABLET | ORAL | 0 refills | Status: DC

## 2019-07-17 NOTE — Telephone Encounter (Signed)
OK to refill. cdm 

## 2019-07-22 NOTE — Progress Notes (Signed)
Office Note     CC:  Intermittent RLE swelling x one month Requesting Provider:  Marcine Matar, MD  HPI: Michael [name] is a 59 y.o. (03/21/1961) male who presents with c/o right lower extremity swelling times one month. He states he notices mild, intermittent right ankle swelling when he does more walking than usual. He denies  Leg cramps or rest pain.  On the day he made this appointment, he saw his cardiologist (Dr. Clifton James) and there were no documented c/o of leg issues.  He has a history of  RLE DVT and bilateral PE at the time of his left fem-BK pop bypass in August of 2019 and was put on coumadin. He was seen at the anticoag clinic today and decided he did not want to switch to Xarelto.   January 23, 2018: Procedure: #1 right popliteal and tibial embolectomy #2 exploration right anterior tibial artery #3 left femoral to below-knee popliteal bypass (PTFE) #4 left popliteal and tibial endarterectomy by Dr. Darrick Penna. Last seen here in our office in June of 2020: no significant carotid artery stenosis; left and right ABIs approximately 0.6. Rec: follow-up in one year.  The pt is on a statin for cholesterol management.  The pt is on a daily aspirin.   Other AC:  coumadin The pt is on lisinopril, metoprolol for hypertension.   The pt is not diabetic.   Tobacco hx:  none  Past Medical History:  Diagnosis Date  . Alcohol abuse   . Anterior myocardial infarction [facility])    ST-elevation; S/P emergent  drug-eluting stenting of proximal left anterior descending  . Coronary artery disease    a. STEMI 2012 - s/p DES to LAD with  diffuse distal disease in the right posterolateral branch. The third OM was occluded and filled from left to left collaterals. LVEF preserved.  . Dysuria 04/26/2013  . Essential hypertension 05/16/2014  . Frequent urination 09/03/2014  . HNP (herniated nucleus pulposus), cervical 10/14/2014  . Hyperlipidemia   . Hypertension   . Ischemia of extremity 01/23/2018  .  Lower abdominal pain 09/03/2014  . PAD (peripheral artery disease) (HCC) 08/11/2016  . PAD (peripheral artery disease) (HCC) 08/11/2016  . Pre-diabetes   . Renal insufficiency   . Special screening for malignant neoplasms, colon     Past Surgical History:  Procedure Laterality Date  . COLONOSCOPY N/A 08/16/2014   Procedure: COLONOSCOPY;  Surgeon: West Bali, MD;  [facility];  Service: Endoscopy;  Laterality: N/A;  10:15 AM  . DOPPLER ECHOCARDIOGRAPHY     Preserved left venticular function  . EMBOLECTOMY Right 01/23/2018   Procedure: RIGHT POPLITEAL-TIBIAL  ARTERY EMBOLECTOMY, EXPLORATION OF RIGHT ANTERIOR TIBIAL ARTERY, RIGHT POPLITEAL ARTERY VEIN ANGIOPLASTY;  Surgeon: Sherren Kerns, MD;  Location: [facility] OR;  Service: Vascular;  Laterality: Right;  . ENDARTERECTOMY POPLITEAL Left 01/23/2018   Procedure: ENDARTERECTOMY OF LEFT POPLITEAL ARTERY AND TIBIAL-PERONEAL TRUNK;  Surgeon: Sherren Kerns, MD;  Location: [facility] OR;  Service: Vascular;  Laterality: Left;  . FEMORAL-POPLITEAL BYPASS GRAFT Left 01/23/2018   Procedure: BYPASS GRAFT LEFT FEMORAL-POPLITEAL ARTERY WITH PROPATEN VASCULAR GRAFT;  Surgeon: Sherren Kerns, MD;  Location: [facility] OR;  Service: Vascular;  Laterality: Left;  . HEMATOMA EVACUATION Left 01/25/2018   Procedure: EVACUATION HEMATOMA LEFT POPLITEAL SPACE;  Surgeon: Cephus Shelling, MD;  Location: [facility] OR;  Service: Vascular;  Laterality: Left;  . LEG SURGERY     left leg - has rod and 4 pins  .  PATELLAR TENDON REPAIR Right 04/26/2014   Procedure: RIGHT PATELLA TENDON REPAIR;  Surgeon: Sheral Apley, MD;  Location: [facility];  Service: Orthopedics;  Laterality: Right;    Social History   Socioeconomic History  . Marital status: Significant Other    Spouse name: Not on file  . Number of children: 0  . Years of education: Not on file  . Highest education level: Not on file  Occupational History  . Not on file  Tobacco Use  . Smoking  status: Never Smoker  . Smokeless tobacco: Never Used  Substance and Sexual Activity  . Alcohol use: Yes    Comment: Drinks occassionally  . Drug use: No  . Sexual activity: Not on file  Other Topics Concern  . Not on file  Social History Narrative  . Not on file   Social Determinants of Health   Financial Resource Strain:   . Difficulty of Paying Living Expenses: Not on file  Food Insecurity:   . Worried About Programme researcher, broadcasting/film/video in the Last Year: Not on file  . Ran Out of Food in the Last Year: Not on file  Transportation Needs:   . Lack of Transportation (Medical): Not on file  . Lack of Transportation (Non-Medical): Not on file  Physical Activity:   . Days of Exercise per Week: Not on file  . Minutes of Exercise per Session: Not on file  Stress:   . Feeling of Stress : Not on file  Social Connections:   . Frequency of Communication with Friends and Family: Not on file  . Frequency of Social Gatherings with Friends and Family: Not on file  . Attends Religious Services: Not on file  . Active Member of Clubs or Organizations: Not on file  . Attends Banker Meetings: Not on file  . Marital Status: Not on file  Intimate Partner Violence:   . Fear of Current or Ex-Partner: Not on file  . Emotionally Abused: Not on file  . Physically Abused: Not on file  . Sexually Abused: Not on file    Family History  Problem Relation Age of Onset  . Cancer Maternal Aunt   . Hyperlipidemia Other   . Diabetes Other   . Heart disease Other     Current Outpatient Medications  Medication Sig Dispense Refill  . aspirin EC 81 MG tablet Take 1 tablet (81 mg total) by mouth daily. 90 tablet 3  . lisinopril (ZESTRIL) 10 MG tablet Take 1 tablet by mouth once daily 90 tablet 1  . metoprolol tartrate (LOPRESSOR) 25 MG tablet Take 1 tablet (25 mg total) by mouth 2 (two) times daily. 180 tablet 3  . nitroGLYCERIN (NITROSTAT) 0.4 MG SL tablet Place 1 tablet (0.4 mg total) under the  tongue every 5 (five) minutes as needed for chest pain. 25 tablet 4  . rosuvastatin (CRESTOR) 40 MG tablet Take 1 tablet (40 mg total) by mouth daily. 90 tablet 3  . warfarin (COUMADIN) 5 MG tablet Take 1 to 1.5 tablets daily as directed by Coumadin clinic 35 tablet 0   No current facility-administered medications for this visit.    Allergies  Allergen Reactions  . Ivp Dye [Iodinated Diagnostic Agents] Swelling     REVIEW OF SYSTEMS:   [X]  denotes positive finding, [ ]  denotes negative finding Cardiac  Comments:  Chest pain or chest pressure:    Shortness of breath upon exertion:    Short of breath when lying flat:  Irregular heart rhythm:        Vascular    Pain in calf, thigh, or hip brought on by ambulation:    Pain in feet at night that wakes you up from your sleep:     Blood clot in your veins:    Leg swelling:  x       Pulmonary    Oxygen at home:    Productive cough:     Wheezing:         Neurologic    Sudden weakness in arms or legs:     Sudden numbness in arms or legs:     Sudden onset of difficulty speaking or slurred speech:    Temporary loss of vision in one eye:     Problems with dizziness:         Gastrointestinal    Blood in stool:     Vomited blood:         Genitourinary    Burning when urinating:     Blood in urine:        Psychiatric    Major depression:         Hematologic    Bleeding problems:    Problems with blood clotting too easily:        Skin    Rashes or ulcers:        Constitutional    Fever or chills:      PHYSICAL EXAMINATION:  There were no vitals filed for this visit.  General:  WDWN in NAD; vital signs documented above Gait: Not observed HENT: WNL, normocephalic Pulmonary: normal non-labored breathing , without Rales, rhonchi,  wheezing Cardiac: regular HR, without  Murmurs without carotid bruits Abdomen: soft, NT, no masses Skin: without rashes Vascular Exam/Pulses:  Right Left  Radial 2+ (normal) 2+  (normal)  Ulnar Not eval Not eval  Femoral 2+ (normal) 2+ (normal)  Popliteal absent absent  DP absent absent  PT absent absent   Extremities: without ischemic changes, without Gangrene , without cellulitis; without open wounds; Dopplerable right PT and peroneal signals. Dopplerable left DP, PT and peroneal signals Musculoskeletal: no muscle wasting or atrophy  Neurologic: A&O X 3;  No focal weakness or paresthesias are detected Psychiatric:  The pt has Normal affect.   Non-Invasive Vascular Imaging:   none today    ASSESSMENT/PLAN:: 59 y.o. male here for follow up for intermittent lower extremity edema. None present today. Rec: compression knee high stocking. Continue regular walking/exercise. Return in June for annual surveillance exam.   -Barbie Banner, PA-C [facility] [phone]  Clinic MD:   Trula Slade

## 2019-07-23 ENCOUNTER — Ambulatory Visit (INDEPENDENT_AMBULATORY_CARE_PROVIDER_SITE_OTHER): Payer: Medicare Other | Admitting: *Deleted

## 2019-07-23 ENCOUNTER — Other Ambulatory Visit: Payer: Self-pay

## 2019-07-23 ENCOUNTER — Ambulatory Visit (INDEPENDENT_AMBULATORY_CARE_PROVIDER_SITE_OTHER): Payer: Medicare Other | Admitting: Physician Assistant

## 2019-07-23 VITALS — BP 143/91 | HR 72 | Temp 96.8°F | Resp 18 | Ht 68.5 in | Wt 223.0 lb

## 2019-07-23 DIAGNOSIS — I824Y1 Acute embolism and thrombosis of unspecified deep veins of right proximal lower extremity: Secondary | ICD-10-CM

## 2019-07-23 DIAGNOSIS — I2699 Other pulmonary embolism without acute cor pulmonale: Secondary | ICD-10-CM | POA: Diagnosis not present

## 2019-07-23 DIAGNOSIS — Z5181 Encounter for therapeutic drug level monitoring: Secondary | ICD-10-CM | POA: Diagnosis not present

## 2019-07-23 DIAGNOSIS — I739 Peripheral vascular disease, unspecified: Secondary | ICD-10-CM

## 2019-07-23 LAB — POCT INR: INR: 1.6 — AB (ref 2.0–3.0)

## 2019-07-23 NOTE — Patient Instructions (Addendum)
Description   Take 2 tablets today, then start taking 1 tablet daily except 1.5 tablets on Mondays, Wednesdays and Fridays. Recheck INR in 10 days. Coumadin Clinic [phone]. Main [phone]

## 2019-07-23 NOTE — Progress Notes (Signed)
Pt stated he dose not want to switch to Xarelto at this time.

## 2019-07-24 ENCOUNTER — Other Ambulatory Visit: Payer: Medicare Other

## 2019-09-03 ENCOUNTER — Other Ambulatory Visit: Payer: Self-pay | Admitting: Cardiovascular Disease

## 2019-09-08 ENCOUNTER — Other Ambulatory Visit: Payer: Self-pay | Admitting: Cardiovascular Disease

## 2019-09-15 ENCOUNTER — Other Ambulatory Visit: Payer: Self-pay | Admitting: Physician Assistant

## 2019-10-15 ENCOUNTER — Telehealth: Payer: Self-pay | Admitting: *Deleted

## 2019-10-15 NOTE — Telephone Encounter (Signed)
Pt is overdue for an INR recheck; called pt but unable to leave a message as mailbox is full.

## 2019-11-08 ENCOUNTER — Other Ambulatory Visit: Payer: Self-pay | Admitting: Physician Assistant

## 2019-11-08 DIAGNOSIS — I739 Peripheral vascular disease, unspecified: Secondary | ICD-10-CM

## 2019-12-03 ENCOUNTER — Telehealth: Payer: Self-pay

## 2019-12-03 MED ORDER — WARFARIN SODIUM 5 MG PO TABS: ORAL_TABLET | ORAL | 0 refills | Status: DC

## 2019-12-03 NOTE — Telephone Encounter (Signed)
Received refill request for Warfarin from [facility] on [address].  Pt has not been seen since 07/23/19, needs OV ASAP.  Called spoke with pt, pt has not been taking Warfarin, last refill was for 12 tablets on 09/03/19.  Pt states he has not taken for some time now, but wishes to resume taking.  Explained to pt we can only give him a weeks supply, then we will need to see him in the office for an INR check prior to refilling rx x 1 month.  Pt verbalized understanding and made a follow-up appt in the Coumadin Clinic on 12/11/19 at 1:45pm.  Pt is aware he must be seen in office for further refills on his Warfarin.  Since pt has been off x several months, will send in a 7 day supply to pharmacy so pt can resume at previous dosage 1 tablet daily except 1.5 tablets on MWF and then we will check INR on 6/29/21and send in a complete refill to the pharmacy at that time.

## 2019-12-25 NOTE — Progress Notes (Deleted)
Cardiology Office Note    Date:  12/25/2019   ID:  Michael [name], DOB Mar 07, 1961, MRN [id number]  PCP:  Marcine Matar, MD  Cardiologist: Verne Carrow, MD EPS: None  No chief complaint on file.   History of Present Illness:  Michael [name] is a 59 y.o. male with history of CAD 03/31/11 ant STEMI DES pLAD with diffusedistal disease in the right posterolateral branch. The third OM was occluded and filled from left to left collaterals. He had a severe dye reaction. LV function was preserved by echo.August 2019 he had acute on chronic ischemia with severe tibial artery occlusive disease bilaterally leading to a right popliteal and tibial embolectomy with left femoral to below the knee popliteal bypass and left popliteal and tibial endarterectomy.  Also had acute right leg DVT and bilateral pulmonary emboli at that time. He has been on coumadin.   Last office visit with Dr. Clifton James 06/21/2019 and was doing well. Monitor 07/12/19 NSR with sinus bradycardia, rare PVC's rare PAC's with two 4-5 beat run SVT.        Past Medical History:  Diagnosis Date   Alcohol abuse    Anterior myocardial infarction [facility])    ST-elevation; S/P emergent  drug-eluting stenting of proximal left anterior descending   Coronary artery disease    a. STEMI 2012 - s/p DES to LAD with  diffuse distal disease in the right posterolateral branch. The third OM was occluded and filled from left to left collaterals. LVEF preserved.   Dysuria 04/26/2013   Essential hypertension 05/16/2014   Frequent urination 09/03/2014   HNP (herniated nucleus pulposus), cervical 10/14/2014   Hyperlipidemia    Hypertension    Ischemia of extremity 01/23/2018   Lower abdominal pain 09/03/2014   PAD (peripheral artery disease) (HCC) 08/11/2016   PAD (peripheral artery disease) (HCC) 08/11/2016   Pre-diabetes    Renal insufficiency    Special screening for malignant neoplasms, colon     Past Surgical  History:  Procedure Laterality Date   COLONOSCOPY N/A 08/16/2014   Procedure: COLONOSCOPY;  Surgeon: West Bali, MD;  [facility];  Service: Endoscopy;  Laterality: N/A;  10:15 AM   DOPPLER ECHOCARDIOGRAPHY     Preserved left venticular function   EMBOLECTOMY Right 01/23/2018   Procedure: RIGHT POPLITEAL-TIBIAL  ARTERY EMBOLECTOMY, EXPLORATION OF RIGHT ANTERIOR TIBIAL ARTERY, RIGHT POPLITEAL ARTERY VEIN ANGIOPLASTY;  Surgeon: Sherren Kerns, MD;  Location: [facility] OR;  Service: Vascular;  Laterality: Right;   ENDARTERECTOMY POPLITEAL Left 01/23/2018   Procedure: ENDARTERECTOMY OF LEFT POPLITEAL ARTERY AND TIBIAL-PERONEAL TRUNK;  Surgeon: Sherren Kerns, MD;  Location: [facility] OR;  Service: Vascular;  Laterality: Left;   FEMORAL-POPLITEAL BYPASS GRAFT Left 01/23/2018   Procedure: BYPASS GRAFT LEFT FEMORAL-POPLITEAL ARTERY WITH PROPATEN VASCULAR GRAFT;  Surgeon: Sherren Kerns, MD;  Location: [facility] OR;  Service: Vascular;  Laterality: Left;   HEMATOMA EVACUATION Left 01/25/2018   Procedure: EVACUATION HEMATOMA LEFT POPLITEAL SPACE;  Surgeon: Cephus Shelling, MD;  Location: [facility] OR;  Service: Vascular;  Laterality: Left;   LEG SURGERY     left leg - has rod and 4 pins   PATELLAR TENDON REPAIR Right 04/26/2014   Procedure: RIGHT PATELLA TENDON REPAIR;  Surgeon: Sheral Apley, MD;  Location: [facility];  Service: Orthopedics;  Laterality: Right;    Current Medications: No outpatient medications have been marked as taking for the 12/26/19 encounter (Appointment) with Dyann Kief, PA-C.  Allergies:   Ivp dye [iodinated diagnostic agents]   Social History   Socioeconomic History   Marital status: Significant Other    Spouse name: Not on file   Number of children: 0   Years of education: Not on file   Highest education level: Not on file  Occupational History   Not on file  Tobacco Use   Smoking status: Never Smoker   Smokeless tobacco:  Never Used  Substance and Sexual Activity   Alcohol use: Yes    Comment: Drinks occassionally   Drug use: No   Sexual activity: Not on file  Other Topics Concern   Not on file  Social History Narrative   Not on file   Social Determinants of Health   Financial Resource Strain:    Difficulty of Paying Living Expenses:   Food Insecurity:    Worried About Programme researcher, broadcasting/film/video in the Last Year:    Barista in the Last Year:   Transportation Needs:    Freight forwarder (Medical):    Lack of Transportation (Non-Medical):   Physical Activity:    Days of Exercise per Week:    Minutes of Exercise per Session:   Stress:    Feeling of Stress :   Social Connections:    Frequency of Communication with Friends and Family:    Frequency of Social Gatherings with Friends and Family:    Attends Religious Services:    Active Member of Clubs or Organizations:    Attends Engineer, structural:    Marital Status:      Family History:  The patient's ***family history includes Cancer in his maternal aunt; Diabetes in an other family member; Heart disease in an other family member; Hyperlipidemia in an other family member.   ROS:   Please see the history of present illness.    ROS All other systems reviewed and are negative.   PHYSICAL EXAM:   VS:  There were no vitals taken for this visit.  Physical Exam  GEN: Well nourished, well developed, in no acute distress  HEENT: normal  Neck: no JVD, carotid bruits, or masses Cardiac:RRR; no murmurs, rubs, or gallops  Respiratory:  clear to auscultation bilaterally, normal work of breathing GI: soft, nontender, nondistended, + BS Ext: without cyanosis, clubbing, or edema, Good distal pulses bilaterally MS: no deformity or atrophy  Skin: warm and dry, no rash Neuro:  Alert and Oriented x 3, Strength and sensation are intact Psych: euthymic mood, full affect  Wt Readings from Last 3 Encounters:  07/23/19  223 lb (101.2 kg)  06/21/19 226 lb 12.8 oz (102.9 kg)  11/23/18 212 lb 12.8 oz (96.5 kg)      Studies/Labs Reviewed:   EKG:  EKG is*** ordered today.  The ekg ordered today demonstrates ***  Recent Labs: No results found for requested labs within last 8760 hours.   Lipid Panel    Component Value Date/Time   CHOL 133 08/11/2016 1030   TRIG 96 08/11/2016 1030   HDL 37 (L) 08/11/2016 1030   CHOLHDL 3.6 08/11/2016 1030   VLDL 19 08/11/2016 1030   LDLCALC 77 08/11/2016 1030   LDLDIRECT 164.1 05/04/2013 1107    Additional studies/ records that were reviewed today include:   holter monitor 07/12/19 Study Highlights  Sinus rhythm with sinus bradycardia  Rare premature ventricular contractions (PVCs) Rare premature atrial contractions with two 4-5 beat runs of supraventricular tachycardia.    IMPRESSION: VASCULAR   Arterial  occlusion of the right SFA, right popliteal artery and majority of the right runoff vessels. There appears to be minimal distal reconstitution of the right posterior tibial artery.   Arterial occlusion of the left popliteal artery and proximal runoff vessels. Distal reconstitution of the left posterior tibial artery.   Bilateral pulmonary emboli. The chest is not completely imaged and the extent of the pulmonary emboli is not known.   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   Mild irregularity of the left renal artery and cannot exclude FMD.   NON-VASCULAR   Indeterminate exophytic low-density structure involving the left kidney lower pole that measures roughly 1.3 cm. Hounsfield units are not compatible with a simple cyst. Consider non emergent renal ultrasound to see if this is a hyperdense cyst. Patient had a renal ultrasound on 10/29/2016 and there may have been a small structure at this location but recommend follow-up renal ultrasound with attention to the left kidney lower pole.   Colonic diverticulosis.     These results will be called to the ordering clinician or representative by the Radiologist Assistant, and communication documented in the PACS or zVision Dashboard.     Electronically Signed   By: Richarda Overlie M.D.   On: 01/24/2018 08:18   Cardiac cath 03/31/11: 1. Right coronary artery: The vessel is large in size and dominant.  There is a mild 20% disease in the mid segment with minor luminal  irregularities distally. The PDA is normal in size and free of  significant disease. First posterolateral branch is normal in size  with 40% proximal disease. The second posterolateral branch is  relatively large in size with 50% disease at the ostium followed by  a 70% lesion proximally and diffuse 80% disease distally. The RCA  gives collaterals to the left anterior descending artery as well as  OM-3.  2. Left main coronary artery: The vessel is normal in size without  significant disease.  3. Left anterior descending artery: The vessel is normal in size and  is occluded proximally a small septal branch. The distal LAD does  not have significant disease. The first diagonal branch is normal  to large in size and has 2 branches. The superior branch is  overall normal. The inferior branch has a 90% proximal disease.  4. Left circumflex artery: The vessel is normal in size and  nondominant. The first obtuse marginal is normal in size with a  mild proximal disease. The second OM is normal in size with no  significant disease. Third OM appears to be occluded with  collaterals supplied from the distal LAD and the right coronary  Artery.      ASSESSMENT:    No diagnosis found.   PLAN:  In order of problems listed above:  CAD status post anterior STEMI 03/31/2011 treated with DES to the LAD with moderate disease in other vessels.  On aspirin statin and beta-blocker  Hypertension  Hyperlipidemia  Palpitations  Lower extremity DVT was on Coumadin but transition to  Xarelto    Medication Adjustments/Labs and Tests Ordered: Current medicines are reviewed at length with the patient today.  Concerns regarding medicines are outlined above.  Medication changes, Labs and Tests ordered today are listed in the Patient Instructions below. There are no Patient Instructions on file for this visit.   Signed, Jacolyn Reedy, PA-C  12/25/2019 1:58 PM    [facility] [address] [place], [place], [place]  [address] Phone: [phone]; Fax: [phone]

## 2019-12-26 ENCOUNTER — Ambulatory Visit: Payer: Medicare Other | Admitting: Physician Assistant

## 2020-01-01 ENCOUNTER — Telehealth: Payer: Self-pay

## 2020-01-01 NOTE — Telephone Encounter (Signed)
Called and unable to lmom for overdue inr this is the 3rd and final attempt. Will discontinue episode of anticoagulation as advised by Laural Golden

## 2020-03-10 ENCOUNTER — Telehealth: Payer: Self-pay | Admitting: *Deleted

## 2020-03-10 MED ORDER — WARFARIN SODIUM 5 MG PO TABS: ORAL_TABLET | ORAL | 0 refills | Status: DC

## 2020-03-10 NOTE — Telephone Encounter (Signed)
Pt called and stated he has been a bad patient and needed to come in to have his INR checked. Advised that we have been trying to get in touch with him and we had no success. He stated he knows and is sorry as he moved and changed numbers but now he is ready to start back on track with taking meds and coming to appts. Advised that taking Warfarin is not anything he needs to play around with and should be taken seriously as he could have another clot-pulmonary embolus or blood clot in the leg and lead to a stroke or have a fatality, then advised that he has to take care of himself and come to appts when made and he verbalized understanding. He is aware that I can only send in a week supply to get him to the appt and he has to come. He also, has an appt with Marcelino Duster on 03/25/20 and is aware that we cannot send in a 2 week supply since he has been missing for so long. Sent in meds and made an appt.

## 2020-03-24 NOTE — Progress Notes (Deleted)
Cardiology Office Note    Date:  03/24/2020   ID:  Michael [name], DOB 08-31-1960, MRN [id number]  PCP:  Marcine Matar, MD  Cardiologist: Verne Carrow, MD EPS: None  No chief complaint on file.   History of Present Illness:  Michael [name] is a 59 y.o. male with history of CAD 03/31/11 ant STEMI DES pLAD with diffusedistal disease in the right posterolateral branch. The third OM was occluded and filled from left to left collaterals. He had a severe dye reaction. LV function was preserved by echo.August 2019 he had acute on chronic ischemia with severe tibial artery occlusive disease bilaterally leading to a right popliteal and tibial embolectomy with left femoral to below the knee popliteal bypass and left popliteal and tibial endarterectomy.  Also had acute right leg DVT and bilateral pulmonary emboli at that time. He has been on coumadin.   Last office visit with Dr. Clifton James 06/21/2019 and was doing well. Monitor 07/12/19 NSR with sinus bradycardia, rare PVC's rare PAC's with two 4-5 beat run SVT.   Patient has not been compliant with his Coumadin follow-up since February     Past Medical History:  Diagnosis Date  . Alcohol abuse   . Anterior myocardial infarction [facility])    ST-elevation; S/P emergent  drug-eluting stenting of proximal left anterior descending  . Coronary artery disease    a. STEMI 2012 - s/p DES to LAD with  diffuse distal disease in the right posterolateral branch. The third OM was occluded and filled from left to left collaterals. LVEF preserved.  . Dysuria 04/26/2013  . Essential hypertension 05/16/2014  . Frequent urination 09/03/2014  . HNP (herniated nucleus pulposus), cervical 10/14/2014  . Hyperlipidemia   . Hypertension   . Ischemia of extremity 01/23/2018  . Lower abdominal pain 09/03/2014  . PAD (peripheral artery disease) (HCC) 08/11/2016  . PAD (peripheral artery disease) (HCC) 08/11/2016  . Pre-diabetes   . Renal insufficiency     . Special screening for malignant neoplasms, colon     Past Surgical History:  Procedure Laterality Date  . COLONOSCOPY N/A 08/16/2014   Procedure: COLONOSCOPY;  Surgeon: West Bali, MD;  [facility];  Service: Endoscopy;  Laterality: N/A;  10:15 AM  . DOPPLER ECHOCARDIOGRAPHY     Preserved left venticular function  . EMBOLECTOMY Right 01/23/2018   Procedure: RIGHT POPLITEAL-TIBIAL  ARTERY EMBOLECTOMY, EXPLORATION OF RIGHT ANTERIOR TIBIAL ARTERY, RIGHT POPLITEAL ARTERY VEIN ANGIOPLASTY;  Surgeon: Sherren Kerns, MD;  Location: [facility] OR;  Service: Vascular;  Laterality: Right;  . ENDARTERECTOMY POPLITEAL Left 01/23/2018   Procedure: ENDARTERECTOMY OF LEFT POPLITEAL ARTERY AND TIBIAL-PERONEAL TRUNK;  Surgeon: Sherren Kerns, MD;  Location[facility] OR;  Service: Vascular;  Laterality: Left;  . FEMORAL-POPLITEAL BYPASS GRAFT Left 01/23/2018   Procedure: BYPASS GRAFT LEFT FEMORAL-POPLITEAL ARTERY WITH PROPATEN VASCULAR GRAFT;  Surgeon: Sherren Kerns, MD;  Location: [facility] OR;  Service: Vascular;  Laterality: Left;  . HEMATOMA EVACUATION Left 01/25/2018   Procedure: EVACUATION HEMATOMA LEFT POPLITEAL SPACE;  Surgeon: Cephus Shelling, MD;  Location: [facility] OR;  Service: Vascular;  Laterality: Left;  . LEG SURGERY     left leg - has rod and 4 pins  . PATELLAR TENDON REPAIR Right 04/26/2014   Procedure: RIGHT PATELLA TENDON REPAIR;  Surgeon: Sheral Apley, MD;  Location: [facility];  Service: Orthopedics;  Laterality: Right;    Current Medications: No outpatient medications have been marked as taking for the  03/25/20 encounter (Appointment) with Dyann Kief, PA-C.     Allergies:   Ivp dye [iodinated diagnostic agents]   Social History   Socioeconomic History  . Marital status: Significant Other    Spouse name: Not on file  . Number of children: 0  . Years of education: Not on file  . Highest education level: Not on file  Occupational History  . Not on  file  Tobacco Use  . Smoking status: Never Smoker  . Smokeless tobacco: Never Used  Substance and Sexual Activity  . Alcohol use: Yes    Comment: Drinks occassionally  . Drug use: No  . Sexual activity: Not on file  Other Topics Concern  . Not on file  Social History Narrative  . Not on file   Social Determinants of Health   Financial Resource Strain:   . Difficulty of Paying Living Expenses: Not on file  Food Insecurity:   . Worried About Programme researcher, broadcasting/film/video in the Last Year: Not on file  . Ran Out of Food in the Last Year: Not on file  Transportation Needs:   . Lack of Transportation (Medical): Not on file  . Lack of Transportation (Non-Medical): Not on file  Physical Activity:   . Days of Exercise per Week: Not on file  . Minutes of Exercise per Session: Not on file  Stress:   . Feeling of Stress : Not on file  Social Connections:   . Frequency of Communication with Friends and Family: Not on file  . Frequency of Social Gatherings with Friends and Family: Not on file  . Attends Religious Services: Not on file  . Active Member of Clubs or Organizations: Not on file  . Attends Banker Meetings: Not on file  . Marital Status: Not on file     Family History:  The patient's ***family history includes Cancer in his maternal aunt; Diabetes in an other family member; Heart disease in an other family member; Hyperlipidemia in an other family member.   ROS:   Please see the history of present illness.    ROS All other systems reviewed and are negative.   PHYSICAL EXAM:   VS:  There were no vitals taken for this visit.  Physical Exam  GEN: Well nourished, well developed, in no acute distress  HEENT: normal  Neck: no JVD, carotid bruits, or masses Cardiac:RRR; no murmurs, rubs, or gallops  Respiratory:  clear to auscultation bilaterally, normal work of breathing GI: soft, nontender, nondistended, + BS Ext: without cyanosis, clubbing, or edema, Good distal  pulses bilaterally MS: no deformity or atrophy  Skin: warm and dry, no rash Neuro:  Alert and Oriented x 3, Strength and sensation are intact Psych: euthymic mood, full affect  Wt Readings from Last 3 Encounters:  07/23/19 223 lb (101.2 kg)  06/21/19 226 lb 12.8 oz (102.9 kg)  11/23/18 212 lb 12.8 oz (96.5 kg)      Studies/Labs Reviewed:   EKG:  EKG is*** ordered today.  The ekg ordered today demonstrates ***  Recent Labs: No results found for requested labs within last 8760 hours.   Lipid Panel    Component Value Date/Time   CHOL 133 08/11/2016 1030   TRIG 96 08/11/2016 1030   HDL 37 (L) 08/11/2016 1030   CHOLHDL 3.6 08/11/2016 1030   VLDL 19 08/11/2016 1030   LDLCALC 77 08/11/2016 1030   LDLDIRECT 164.1 05/04/2013 1107    Additional studies/ records that were reviewed today  include:   Holter monitor 07/12/19 Study Highlights  Sinus rhythm with sinus bradycardia  Rare premature ventricular contractions (PVCs) Rare premature atrial contractions with two 4-5 beat runs of supraventricular tachycardia.      ASSESSMENT:    No diagnosis found.   PLAN:  In order of problems listed above:    1. CAD without angina: He has no chest pain. He initially presented with an anterior STEMI 03/31/11. A DES was placed in LAD. Moderate disease in other vessels. Will continue ASA, statin and beta blocker.     2. HTN: BP is controlled. No changes   3. HLD: Lipids followed in primary care. Continue statin   4. Palpitations: Will arrange 3 day zio patch cardiac monitor   5. Lower extremity DVT: He is on coumadin. He wants to change back to Xarelto. This will be done in the coumadin clinic in February. Dr. Darrick Penna is following his DVT.      Medication Adjustments/Labs and Tests Ordered: Current medicines are reviewed at length with the patient today.  Concerns regarding medicines are outlined above.  Medication changes, Labs and Tests ordered today are listed in the Patient  Instructions below. There are no Patient Instructions on file for this visit.   Signed, Jacolyn Reedy, PA-C  03/24/2020 4:16 PM    [facility] [address] [place], [place], [place]  [address] Phone: [phone]; Fax: [phone]

## 2020-03-25 ENCOUNTER — Ambulatory Visit: Payer: Medicare Other | Admitting: Physician Assistant

## 2020-03-28 ENCOUNTER — Telehealth: Payer: Self-pay | Admitting: Physician Assistant

## 2020-03-28 NOTE — Telephone Encounter (Signed)
Returned a call to the pt and made him aware that he has canceled many appt and no showed to some so it is imperative that her adheres to appts as he is at risk at for stroke, clots, bleeding, and death without the med being monitored. Discussed that he has to resume appts and keep all appts with our Anticoagulation Clinic and Cardiologist.  Pt states he has not been taken any Warfarin since   Pt will pick up the meds today and he is aware only sending in enough tabs until appt. He will take Warfarin 2 tablets tonight-10/15, 10/16-1.5 tablets, 10/17-1 tablet, 10/18-1.5 tablets, 10/19-1 tablet, 10/20-Appt at 230pm & advised him to be here at 215pm.   Called the [facility] and spoke with Pharmacist gave a verbal order for Warfarin 5mg  tablet for 7 tabs which is a 5 day supply only and take as directed by Coumadin Clinic; pt has an appt pending on Wednesday. She states she will get it ready now.

## 2020-03-28 NOTE — Telephone Encounter (Signed)
Patient called to today to reschedule his missed appt.  He states he is out of warfarin.  He wants to know if a script can be sent in for him as he is out of the medication.

## 2020-04-02 ENCOUNTER — Telehealth: Payer: Self-pay | Admitting: *Deleted

## 2020-04-02 NOTE — Telephone Encounter (Signed)
Called pt since he missed 04/02/20 Anticoagulation Appt; left a message for him to call back to r/s.

## 2020-04-12 ENCOUNTER — Encounter: Payer: Self-pay | Admitting: Physician Assistant

## 2020-04-12 NOTE — Progress Notes (Deleted)
Cardiology Office Note    Date:  04/12/2020   ID:  Michael [name], DOB February 07, 1961, MRN [id number]  PCP:  Marcine Matar, MD  Cardiologist:  Verne Carrow, MD  Electrophysiologist:  None   Chief Complaint: 6 month f/u CAD  History of Present Illness:   Michael [name] is a 59 y.o. male with history of CAD s/p DES to LAD in 2012, IV dye allergy, HTN, HLD, alcohol abuse, pre-diabetes, DVT, PAD, mild carotid disease by duplex 11/2018, PVCs/PACs/brief SVT who presents to clinic for follow-up.    He was admitted to [facility] 03/[facility]/11 with anterior STEMI. There was diffuse distal disease in the right posterolateral branch. The third OM was occluded and filled from left to left collaterals. Proximal LAD occlusion treated with DES. He had a severe dye reaction. LV function was preserved by echo per discharge summary. In August 2019 he had acute on chronic ischemia with severe tibial artery occlusive disease bilaterally leading to a right popliteal and tibial embolectomy with left femoral to below the knee popliteal bypass and left popliteal and tibial endarterectomy. He also had acute right leg DVT and bilateral pulmonary emboli at that time and was started on Coumadin. Dr. Gibson Ramp last note indicates DVT was to be followed by vascular surgery Dr. Darrick Penna. Event monitor for palpitations 06/2019 showed rare PVCs, rare PACs, 2 short 4-5 beat runs SVT. He has not attended f/u in our Coumadin clinic since 07/2019 despite multiple attempts to contact.  Notes indicate recommendation lifelong anticoag, he previously ok'd switch to doac  CAD, also in the context of Hyperlipidemia Essential HTN History of lower extremity DVT Palpitations (hx PACs, PVCs, SVT)  etoh Labs - none since 2020, cmet, cbc, tsh,lipids   CAD  HTN  Hyperlipidemia  Pre-diabetes    Labwork independently reviewed: 11/2018 K 4.0, Cr 1.19, albumin 3.3, CBC wnl 2019 A1c 6.2, LDL 77   Past Medical History:    Diagnosis Date  . Alcohol abuse   . Anterior myocardial infarction [facility])    ST-elevation; S/P emergent  drug-eluting stenting of proximal left anterior descending  . Coronary artery disease    a. STEMI 2012 - s/p DES to LAD with  diffuse distal disease in the right posterolateral branch. The third OM was occluded and filled from left to left collaterals. LVEF preserved.  . Dysuria 04/26/2013  . Essential hypertension 05/16/2014  . Frequent urination 09/03/2014  . HNP (herniated nucleus pulposus), cervical 10/14/2014  . Hyperlipidemia   . Hypertension   . Ischemia of extremity 01/23/2018  . Lower abdominal pain 09/03/2014  . PAD (peripheral artery disease) (HCC) 08/11/2016  . PAD (peripheral artery disease) (HCC) 08/11/2016  . Pre-diabetes   . Renal insufficiency   . Special screening for malignant neoplasms, colon     Past Surgical History:  Procedure Laterality Date  . COLONOSCOPY N/A 08/16/2014   Procedure: COLONOSCOPY;  Surgeon: West Bali, MD;  [facility];  Service: Endoscopy;  Laterality: N/A;  10:15 AM  . DOPPLER ECHOCARDIOGRAPHY     Preserved left venticular function  . EMBOLECTOMY Right 01/23/2018   Procedure: RIGHT POPLITEAL-TIBIAL  ARTERY EMBOLECTOMY, EXPLORATION OF RIGHT ANTERIOR TIBIAL ARTERY, RIGHT POPLITEAL ARTERY VEIN ANGIOPLASTY;  Surgeon: Sherren Kerns, MD;  Location: [facility] OR;  Service: Vascular;  Laterality: Right;  . ENDARTERECTOMY POPLITEAL Left 01/23/2018   Procedure: ENDARTERECTOMY OF LEFT POPLITEAL ARTERY AND TIBIAL-PERONEAL TRUNK;  Surgeon: Sherren Kerns, MD;  Location: [facility] OR;  Service: Vascular;  Laterality:  Left;  . FEMORAL-POPLITEAL BYPASS GRAFT Left 01/23/2018   Procedure: BYPASS GRAFT LEFT FEMORAL-POPLITEAL ARTERY WITH PROPATEN VASCULAR GRAFT;  Surgeon: Sherren Kerns, MD;  Location: [facility] OR;  Service: Vascular;  Laterality: Left;  . HEMATOMA EVACUATION Left 01/25/2018   Procedure: EVACUATION HEMATOMA LEFT POPLITEAL SPACE;  Surgeon: Cephus Shelling, MD;  Location: [facility] OR;  Service: Vascular;  Laterality: Left;  . LEG SURGERY     left leg - has rod and 4 pins  . PATELLAR TENDON REPAIR Right 04/26/2014   Procedure: RIGHT PATELLA TENDON REPAIR;  Surgeon: Sheral Apley, MD;  Location: [facility];  Service: Orthopedics;  Laterality: Right;    Current Medications: No outpatient medications have been marked as taking for the 04/15/20 encounter (Appointment) with Laurann Montana, PA-C.   ***   Allergies:   Ivp dye [iodinated diagnostic agents]   Social History   Socioeconomic History  . Marital status: Significant Other    Spouse name: Not on file  . Number of children: 0  . Years of education: Not on file  . Highest education level: Not on file  Occupational History  . Not on file  Tobacco Use  . Smoking status: Never Smoker  . Smokeless tobacco: Never Used  Substance and Sexual Activity  . Alcohol use: Yes    Comment: Drinks occassionally  . Drug use: No  . Sexual activity: Not on file  Other Topics Concern  . Not on file  Social History Narrative  . Not on file   Social Determinants of Health   Financial Resource Strain:   . Difficulty of Paying Living Expenses: Not on file  Food Insecurity:   . Worried About Programme researcher, broadcasting/film/video in the Last Year: Not on file  . Ran Out of Food in the Last Year: Not on file  Transportation Needs:   . Lack of Transportation (Medical): Not on file  . Lack of Transportation (Non-Medical): Not on file  Physical Activity:   . Days of Exercise per Week: Not on file  . Minutes of Exercise per Session: Not on file  Stress:   . Feeling of Stress : Not on file  Social Connections:   . Frequency of Communication with Friends and Family: Not on file  . Frequency of Social Gatherings with Friends and Family: Not on file  . Attends Religious Services: Not on file  . Active Member of Clubs or Organizations: Not on file  . Attends Banker Meetings: Not  on file  . Marital Status: Not on file     Family History:  The patient's ***family history includes Cancer in his maternal aunt; Diabetes in an other family member; Heart disease in an other family member; Hyperlipidemia in an other family member.  ROS:   Please see the history of present illness. Otherwise, review of systems is positive for ***.  All other systems are reviewed and otherwise negative.    EKGs/Labs/Other Studies Reviewed:    Studies reviewed are outlined and summarized above. Reports included below if pertinent.  Event monitor 06/2019 Sinus rhythm with sinus bradycardia  Rare premature ventricular contractions (PVCs) Rare premature atrial contractions with two 4-5 beat runs of supraventricular tachycardia.  11/2018 Carotids Summary:  Right Carotid: Velocities in the right ICA are consistent with a 1-39%  stenosis,         upper end of range.   Left Carotid: Velocities in the left ICA are consistent with a 1-39%  stenosis.   Vertebrals: Bilateral vertebral arteries demonstrate antegrade flow.  Subclavians: Normal flow hemodynamics were seen in bilateral subclavian        arteries.   *See table(s) above for measurements and observations.      Electronically signed by [name] MD on 11/23/2018 at 3:53:38 PM.     EKG:  EKG is ordered today, personally reviewed, demonstrating ***  Recent Labs: No results found for requested labs within last 8760 hours.  Recent Lipid Panel    Component Value Date/Time   CHOL 133 08/11/2016 1030   TRIG 96 08/11/2016 1030   HDL 37 (L) 08/11/2016 1030   CHOLHDL 3.6 08/11/2016 1030   VLDL 19 08/11/2016 1030   LDLCALC 77 08/11/2016 1030   LDLDIRECT 164.1 05/04/2013 1107    PHYSICAL EXAM:    VS:  There were no vitals taken for this visit.  BMI: There is no height or weight on file to calculate BMI.  GEN: Well nourished, well developed, in no acute distress HEENT: normocephalic, atraumatic Neck:  no JVD, carotid bruits, or masses Cardiac: ***RRR; no murmurs, rubs, or gallops, no edema  Respiratory:  clear to auscultation bilaterally, normal work of breathing GI: soft, nontender, nondistended, + BS MS: no deformity or atrophy Skin: warm and dry, no rash Neuro:  Alert and Oriented x 3, Strength and sensation are intact, follows commands Psych: euthymic mood, full affect  Wt Readings from Last 3 Encounters:  07/23/19 223 lb (101.2 kg)  06/21/19 226 lb 12.8 oz (102.9 kg)  11/23/18 212 lb 12.8 oz (96.5 kg)     ASSESSMENT & PLAN:   1. ***  Disposition: F/u with ***   Medication Adjustments/Labs and Tests Ordered: Current medicines are reviewed at length with the patient today.  Concerns regarding medicines are outlined above. Medication changes, Labs and Tests ordered today are summarized above and listed in the Patient Instructions accessible in Encounters.   Signed, Laurann Montana, PA-C  04/12/2020 2:03 PM    [facility] [address] [place], [place], [place]  [address] Phone: [phone]; Fax: [phone]

## 2020-04-15 ENCOUNTER — Ambulatory Visit: Payer: Medicare Other | Admitting: Physician Assistant

## 2020-05-16 ENCOUNTER — Other Ambulatory Visit: Payer: Self-pay

## 2020-05-16 DIAGNOSIS — I739 Peripheral vascular disease, unspecified: Secondary | ICD-10-CM

## 2020-05-26 ENCOUNTER — Encounter (HOSPITAL_COMMUNITY): Payer: Medicare Other

## 2020-05-26 ENCOUNTER — Ambulatory Visit: Payer: Medicare Other

## 2020-06-03 ENCOUNTER — Other Ambulatory Visit: Payer: Self-pay | Admitting: Cardiovascular Disease

## 2020-06-30 ENCOUNTER — Other Ambulatory Visit: Payer: Self-pay

## 2020-06-30 DIAGNOSIS — I739 Peripheral vascular disease, unspecified: Secondary | ICD-10-CM

## 2020-06-30 MED ORDER — METOPROLOL TARTRATE 25 MG PO TABS: 25.0000 mg | ORAL_TABLET | Freq: Two times a day (BID) | ORAL | 0 refills | Status: DC

## 2020-06-30 MED ORDER — ROSUVASTATIN CALCIUM 40 MG PO TABS: 40.0000 mg | ORAL_TABLET | Freq: Every day | ORAL | 0 refills | Status: DC

## 2020-06-30 MED ORDER — LISINOPRIL 10 MG PO TABS: 10.0000 mg | ORAL_TABLET | Freq: Every day | ORAL | 0 refills | Status: DC

## 2020-07-03 ENCOUNTER — Telehealth: Payer: Self-pay

## 2020-07-03 NOTE — Telephone Encounter (Signed)
Patient called, would like to switch back from warfarin to xarelto now that the insurance is different. Made patient a follow up appointment.

## 2020-07-04 NOTE — Telephone Encounter (Signed)
Received Denial of Xarelto from [facility] - informed patient

## 2020-07-08 ENCOUNTER — Ambulatory Visit (INDEPENDENT_AMBULATORY_CARE_PROVIDER_SITE_OTHER): Payer: Medicare HMO | Admitting: Physician Assistant

## 2020-07-08 ENCOUNTER — Other Ambulatory Visit: Payer: Self-pay

## 2020-07-08 ENCOUNTER — Ambulatory Visit (HOSPITAL_COMMUNITY)
Admission: RE | Admit: 2020-07-08 | Discharge: 2020-07-08 | Disposition: A | Discharge status: Home / Self Care | Payer: Medicare HMO | Source: Ambulatory Visit | Attending: Physician Assistant | Admitting: Physician Assistant

## 2020-07-08 VITALS — BP 148/88 | HR 69 | Temp 98.7°F | Resp 20 | Ht 68.5 in | Wt 214.9 lb

## 2020-07-08 DIAGNOSIS — I739 Peripheral vascular disease, unspecified: Secondary | ICD-10-CM | POA: Insufficient documentation

## 2020-07-08 DIAGNOSIS — R6889 Other general symptoms and signs: Secondary | ICD-10-CM | POA: Diagnosis not present

## 2020-07-08 MED ORDER — ROSUVASTATIN CALCIUM 40 MG PO TABS: 40.0000 mg | ORAL_TABLET | Freq: Every day | ORAL | 0 refills | Status: DC

## 2020-07-08 MED ORDER — METOPROLOL TARTRATE 25 MG PO TABS: 25.0000 mg | ORAL_TABLET | Freq: Two times a day (BID) | ORAL | 0 refills | Status: DC

## 2020-07-08 MED ORDER — LISINOPRIL 10 MG PO TABS: 10.0000 mg | ORAL_TABLET | Freq: Every day | ORAL | 0 refills | Status: DC
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
## 2020-07-15 ENCOUNTER — Other Ambulatory Visit: Payer: Self-pay | Admitting: Cardiovascular Disease

## 2020-07-15 DIAGNOSIS — I739 Peripheral vascular disease, unspecified: Secondary | ICD-10-CM

## 2020-07-15 NOTE — Progress Notes (Deleted)
{Choose 1 Note Type (Video or Telephone):[phone]}   The patient was identified using 2 identifiers.  Date:  07/15/2020   ID:  Michael [name], DOB 1961/06/11, MRN [id number]  {Patient Location:[id number]::"Home"} {Provider [phone]::"Home Office"}  PCP:  Marcine Matar, MD  Cardiologist:  Verne Carrow, MD *** Electrophysiologist:  None   Evaluation Performed:  Follow-Up Visit  Chief Complaint:  Overdue 6 month follow-up, last seen 06/2019 in office  History of Present Illness:    Michael [name] is a 60 y.o. male with CAD s/p DES to LAD in 2012, IV dye allergy, HTN, HLD, alcohol abuse, pre-diabetes, DVT, LE PAD and mild carotid disease by duplex 11/2018 followed by vascular surgery, PVCs/PACs/brief SVT who presents to clinic for follow-up.    He was admitted to [facility] 03/31/11 with anterior STEMI. There was diffuse distal disease in the right posterolateral branch. The third OM was occluded and filled from left to left collaterals. Proximal LAD occlusion treated with DES. He had a severe dye reaction. LV function was preserved by echo per discharge summary. In August 2019 he had acute on chronic ischemia with severe tibial artery occlusive disease bilaterally leading to a right popliteal and tibial embolectomy with left femoral to below the knee popliteal bypass and left popliteal and tibial endarterectomy. He also had acute right leg DVT and bilateral pulmonary emboli at that time and was started on Coumadin. Dr. Gibson Ramp last note indicated DVT was to be followed by vascular surgery Dr. Darrick Penna. Event monitor for palpitations 06/2019 showed rare PVCs, rare PACs, 2 short 4-5 beat runs SVT. He has not attended f/u in our Coumadin clinic since 07/2019 despite multiple attempts to contact. By 03/2020 telephone note he had run out of warfarin and was provided an rx with strict instructions to return to Coumadin clinic. Notes indicate recommendation for lifelong  anticoagulation and was previously OK'd to switch to DOAC.  etoh  Labs - none since 2020, cmet, cbc, tsh,lipids  dvt isue  CAD, also in the context of Hyperlipidemia  Essential HTN  History of lower extremity DVT  Palpitations (hx PACs, PVCs, SVT)    Labs Independently Reviewed 11/2018 K 4.0, Cr 1.19, albumin 3.3, CBC wnl  2019 A1c 6.2, LDL 77     Past Medical History:  Diagnosis Date  . Alcohol abuse   . Anterior myocardial infarction [facility])    ST-elevation; S/P emergent  drug-eluting stenting of proximal left anterior descending  . Coronary artery disease    a. STEMI 2012 - s/p DES to LAD with  diffuse distal disease in the right posterolateral branch. The third OM was occluded and filled from left to left collaterals. LVEF preserved.  Marland Kitchen DVT complicating pregnancy   . Dysuria 04/26/2013  . Essential hypertension 05/16/2014  . Frequent urination 09/03/2014  . HNP (herniated nucleus pulposus), cervical 10/14/2014  . Hyperlipidemia   . Hypertension   . Ischemia of extremity 01/23/2018  . Lower abdominal pain 09/03/2014  . PAD (peripheral artery disease) (HCC)   . Pre-diabetes   . Premature atrial contraction   . PSVT (paroxysmal supraventricular tachycardia) (HCC)    2 short runs on monitor 06/2019  . Pulmonary emboli (HCC)   . PVC's (premature ventricular contractions)   . Renal insufficiency   . Special screening for malignant neoplasms, colon    Past Surgical History:  Procedure Laterality Date  . COLONOSCOPY N/A 08/16/2014   Procedure: COLONOSCOPY;  Surgeon: West Bali, MD;  Location: [facility];  Service: Endoscopy;  Laterality: N/A;  10:15 AM  . DOPPLER ECHOCARDIOGRAPHY     Preserved left venticular function  . EMBOLECTOMY Right 01/23/2018   Procedure: RIGHT POPLITEAL-TIBIAL  ARTERY EMBOLECTOMY, EXPLORATION OF RIGHT ANTERIOR TIBIAL ARTERY, RIGHT POPLITEAL ARTERY VEIN ANGIOPLASTY;  Surgeon: Sherren Kerns, MD;  Location: [facility] OR;  Service: Vascular;  Laterality:  Right;  . ENDARTERECTOMY POPLITEAL Left 01/23/2018   Procedure: ENDARTERECTOMY OF LEFT POPLITEAL ARTERY AND TIBIAL-PERONEAL TRUNK;  Surgeon: Sherren Kerns, MD;  Location: [facility] OR;  Service: Vascular;  Laterality: Left;  . FEMORAL-POPLITEAL BYPASS GRAFT Left 01/23/2018   Procedure: BYPASS GRAFT LEFT FEMORAL-POPLITEAL ARTERY WITH PROPATEN VASCULAR GRAFT;  Surgeon: Sherren Kerns, MD;  Location: [facility] OR;  Service: Vascular;  Laterality: Left;  . HEMATOMA EVACUATION Left 01/25/2018   Procedure: EVACUATION HEMATOMA LEFT POPLITEAL SPACE;  Surgeon: Cephus Shelling, MD;  Location: [facility] OR;  Service: Vascular;  Laterality: Left;  . LEG SURGERY     left leg - has rod and 4 pins  . PATELLAR TENDON REPAIR Right 04/26/2014   Procedure: RIGHT PATELLA TENDON REPAIR;  Surgeon: Sheral Apley, MD;  Location: [facility];  Service: Orthopedics;  Laterality: Right;     No outpatient medications have been marked as taking for the 07/16/20 encounter (Appointment) with Laurann Montana, PA-C.     Allergies:   Ivp dye [iodinated diagnostic agents]   Social History   Tobacco Use  . Smoking status: Never Smoker  . Smokeless tobacco: Never Used  Substance Use Topics  . Alcohol use: Yes    Comment: Drinks occassionally  . Drug use: No     Family Hx: The patient's family history includes Cancer in his maternal aunt; Diabetes in an other family member; Heart disease in an other family member; Hyperlipidemia in an other family member.  ROS:   Please see the history of present illness.    *** All other systems reviewed and are negative.   Prior CV studies:   The following studies were reviewed today:  Event monitor 06/2019  Sinus rhythm with sinus bradycardia  Rare premature ventricular contractions (PVCs)  Rare premature atrial contractions with two 4-5 beat runs of supraventricular tachycardia.   11/2018 Carotids  Summary:  Right Carotid: Velocities in the right ICA are consistent with  a 1-39%  stenosis,         upper end of range.   Left Carotid: Velocities in the left ICA are consistent with a 1-39%  stenosis.   Vertebrals: Bilateral vertebral arteries demonstrate antegrade flow.  Subclavians: Normal flow hemodynamics were seen in bilateral subclavian        arteries.   *See table(s) above for measurements and observations.      Labs/Other Tests and Data Reviewed:    EKG:  An ECG dated 12/01/18 was personally reviewed today and demonstrated:  NSR 71bpm, occasional unifocal PVCs, LAFB, TWI avL, avR,  ST upsloping V1-V5  Recent Labs: No results found for requested labs within last 8760 hours.   Recent Lipid Panel Lab Results  Component Value Date/Time   CHOL 133 08/11/2016 10:30 AM   TRIG 96 08/11/2016 10:30 AM   HDL 37 (L) 08/11/2016 10:30 AM   CHOLHDL 3.6 08/11/2016 10:30 AM   LDLCALC 77 08/11/2016 10:30 AM   LDLDIRECT 164.1 05/04/2013 11:07 AM    Wt Readings from Last 3 Encounters:  07/08/20 214 lb 14.4 oz (97.5 kg)  07/23/19 223 lb (101.2 kg)  06/21/19 226 lb 12.8 oz (102.9 kg)  Objective:    Vital Signs:  There were no vitals taken for this visit.   VS reviewed. General - *** in no acute distress Pulm - No labored breathing, no coughing during visit, no audible wheezing, speaking in full sentences Neuro - A+Ox3, no slurred speech, answers questions appropriately Psych - Pleasant affect  ASSESSMENT & PLAN:    1. ***   Time:   Today, I have spent *** minutes with the patient with telehealth technology discussing the above problems.     Medication Adjustments/Labs and Tests Ordered: Current medicines are reviewed at length with the patient today.  Testing and concerns regarding medicines are outlined above.    Follow Up:  {F/U [phone]} {follow up:15908}  Signed, Laurann Montana, PA-C  07/15/2020 2:25 PM    [facility]

## 2020-07-16 ENCOUNTER — Telehealth: Payer: Medicare HMO | Admitting: Physician Assistant

## 2020-07-16 ENCOUNTER — Telehealth: Payer: Self-pay | Admitting: *Deleted

## 2020-07-16 ENCOUNTER — Other Ambulatory Visit: Payer: Self-pay

## 2020-07-16 ENCOUNTER — Other Ambulatory Visit: Payer: Self-pay | Admitting: Cardiovascular Disease

## 2020-07-16 DIAGNOSIS — I2510 Atherosclerotic heart disease of native coronary artery without angina pectoris: Secondary | ICD-10-CM

## 2020-07-16 DIAGNOSIS — I10 Essential (primary) hypertension: Secondary | ICD-10-CM

## 2020-07-16 DIAGNOSIS — R002 Palpitations: Secondary | ICD-10-CM

## 2020-07-16 DIAGNOSIS — E785 Hyperlipidemia, unspecified: Secondary | ICD-10-CM

## 2020-07-16 DIAGNOSIS — Z86718 Personal history of other venous thrombosis and embolism: Secondary | ICD-10-CM

## 2020-07-16 NOTE — Telephone Encounter (Signed)
  Patient Consent for Virtual Visit         Michael [name] has provided verbal consent on 07/16/2020 for a virtual visit (video or telephone).   CONSENT FOR VIRTUAL VISIT FOR:  Michael [name]  By participating in this virtual visit I agree to the following:  I hereby voluntarily request, consent and authorize [facility] and its employed or contracted physicians, physician assistants, nurse practitioners or other licensed health care professionals (the Practitioner), to provide me with telemedicine health care services (the "Services") as deemed necessary by the treating Practitioner. I acknowledge and consent to receive the Services by the Practitioner via telemedicine. I understand that the telemedicine visit will involve communicating with the Practitioner through live audiovisual communication technology and the disclosure of certain medical information by electronic transmission. I acknowledge that I have been given the opportunity to request an in-person assessment or other available alternative prior to the telemedicine visit and am voluntarily participating in the telemedicine visit.  I understand that I have the right to withhold or withdraw my consent to the use of telemedicine in the course of my care at any time, without affecting my right to future care or treatment, and that the Practitioner or I may terminate the telemedicine visit at any time. I understand that I have the right to inspect all information obtained and/or recorded in the course of the telemedicine visit and may receive copies of available information for a reasonable fee.  I understand that some of the potential risks of receiving the Services via telemedicine include:  Marland Kitchen Delay or interruption in medical evaluation due to technological equipment failure or disruption; . Information transmitted may not be sufficient (e.g. poor resolution of images) to allow for appropriate medical decision making by the  Practitioner; and/or  . In rare instances, security protocols could fail, causing a breach of personal health information.  Furthermore, I acknowledge that it is my responsibility to provide information about my medical history, conditions and care that is complete and accurate to the best of my ability. I acknowledge that Practitioner's advice, recommendations, and/or decision may be based on factors not within their control, such as incomplete or inaccurate data provided by me or distortions of diagnostic images or specimens that may result from electronic transmissions. I understand that the practice of medicine is not an exact science and that Practitioner makes no warranties or guarantees regarding treatment outcomes. I acknowledge that a copy of this consent can be made available to me via my patient portal [facility] MyChart), or I can request a printed copy by calling the office of [facility].    I understand that my insurance will be billed for this visit.   I have read or had this consent read to me. . I understand the contents of this consent, which adequately explains the benefits and risks of the Services being provided via telemedicine.  . I have been provided ample opportunity to ask questions regarding this consent and the Services and have had my questions answered to my satisfaction. . I give my informed consent for the services to be provided through the use of telemedicine in my medical care

## 2020-07-16 NOTE — Progress Notes (Unsigned)
Virtual Visit via Telephone Note   This visit type was conducted due to national recommendations for restrictions regarding the COVID-19 Pandemic (e.g. social distancing) in an effort to limit this patient's exposure and mitigate transmission in our community.  Due to his co-morbid illnesses, this patient is at least at moderate risk for complications without adequate follow up.  This format is felt to be most appropriate for this patient at this time.  The patient did not have access to video technology/had technical difficulties with video requiring transitioning to audio format only (telephone).  All issues noted in this document were discussed and addressed.  No physical exam could be performed with this format.  Please refer to the patient's chart for his  consent to telehealth for [facility].  Caregility not connecting video at this time despite multiple tries.  The patient was identified using 2 identifiers.  Date:  07/18/2020   ID:  Michael [name], DOB 1961/06/02, MRN [id number]  Patient Location: Home Provider Location: Office/Clinic  PCP:  Marcine Matar, MD  Cardiologist:  Verne Carrow, MD Electrophysiologist:  None   Evaluation Performed:  Follow-Up Visit  Chief Complaint:  Overdue 6 month follow-up, last seen 06/2019 in office   History of Present Illness:    Michael [name] is a 60 y.o. male with CAD s/p DES to LAD in 2012, IV dye allergy, HTN, HLD, alcohol abuse, pre-diabetes, DVT, LE PAD, stroke by prior MRI 11/2018 and mild carotid disease by duplex 11/2018 followed by vascular surgery, PVCs/PACs/brief SVT who presents to clinic for follow-up.    He was admitted to [facility] 03/31/11 with anterior STEMI. There was diffuse distal disease in the right posterolateral branch. The third OM was occluded and filled from left to left collaterals. Proximal LAD occlusion treated with DES. He had a severe dye reaction. LV function was preserved by echo per discharge  summary. In August 2019 he had acute on chronic ischemia with severe tibial artery occlusive disease bilaterally leading to a right popliteal and tibial embolectomy with left femoral to below the knee popliteal bypass and left popliteal and tibial endarterectomy. He also had acute right leg DVT and bilateral pulmonary emboli at that time and was started on Coumadin. He had been initiated on Xarelto but could not afford it at the time. His compliance has been poor with this. His INR was never therapeutic due to multiple missed doses. He has not attended f/u in our Coumadin clinic since 07/2019 despite multiple attempts to contact him. By 03/2020 telephone note he had run out of warfarin and was provided a short term rx with strict instructions to return to Coumadin clinic but he did not come in. Prior event monitor for palpitations 06/2019 showed rare PVCs, rare PACs, 2 short 4-5 beat runs SVT, managed conservatively.  He is seen back virtually for follow-up today. He would like to discuss going back on Coumadin. He denies any chest pain, SOB, orthopnea, palpitations, bleeding or leg pain. No longer using ETOH. He had a 90 day refill on all of his medicines (except Coumadin) recently but unfortunately states he was living with a roommate who was alcoholic and has locked him out, so he has not had access to his medications reliably. It sounds like he was able to get 15 pills of one of his medicines from the pharmacy locally. He is staying with his mother now. He denies any falls but is contemplating use of a cane due to some knee issues. Dr.  McAlhany's last note indicated DVT was followed by vascular surgery but I do not see this specifically addressed at their most recent OV on 07/08/20 aside from reinforcement of keeping a close check on Coumadin. Remote notes indicate recommendation for lifelong anticoagulation and was previously OK'd to consider switch to DOAC (but had not returned to Coumadin clinic to discuss  this).    Labs Independently Reviewed 11/2018 K 4.0, Cr 1.19, albumin 3.3, CBC wnl  2019 A1c 6.2, LDL 77   Past Medical History:  Diagnosis Date  . Alcohol abuse   . Anterior myocardial infarction [facility])    ST-elevation; S/P emergent  drug-eluting stenting of proximal left anterior descending  . Coronary artery disease    a. STEMI 2012 - s/p DES to LAD with  diffuse distal disease in the right posterolateral branch. The third OM was occluded and filled from left to left collaterals. LVEF preserved.  Marland Kitchen DVT complicating pregnancy   . Dysuria 04/26/2013  . Essential hypertension 05/16/2014  . Frequent urination 09/03/2014  . HNP (herniated nucleus pulposus), cervical 10/14/2014  . Hyperlipidemia   . Hypertension   . Ischemia of extremity 01/23/2018  . Lower abdominal pain 09/03/2014  . PAD (peripheral artery disease) (HCC)   . Pre-diabetes   . Premature atrial contraction   . PSVT (paroxysmal supraventricular tachycardia) (HCC)    2 short runs on monitor 06/2019  . Pulmonary emboli (HCC)   . PVC's (premature ventricular contractions)   . Renal insufficiency   . Special screening for malignant neoplasms, colon    Past Surgical History:  Procedure Laterality Date  . COLONOSCOPY N/A 08/16/2014   Procedure: COLONOSCOPY;  Surgeon: West Bali, MD;  Location: [facility];  Service: Endoscopy;  Laterality: N/A;  10:15 AM  . DOPPLER ECHOCARDIOGRAPHY     Preserved left venticular function  . EMBOLECTOMY Right 01/23/2018   Procedure: RIGHT POPLITEAL-TIBIAL  ARTERY EMBOLECTOMY, EXPLORATION OF RIGHT ANTERIOR TIBIAL ARTERY, RIGHT POPLITEAL ARTERY VEIN ANGIOPLASTY;  Surgeon: Sherren Kerns, MD;  Location: [facility] OR;  Service: Vascular;  Laterality: Right;  . ENDARTERECTOMY POPLITEAL Left 01/23/2018   Procedure: ENDARTERECTOMY OF LEFT POPLITEAL ARTERY AND TIBIAL-PERONEAL TRUNK;  Surgeon: Sherren Kerns, MD;  Location: [facility] OR;  Service: Vascular;  Laterality: Left;  . FEMORAL-POPLITEAL BYPASS GRAFT  Left 01/23/2018   Procedure: BYPASS GRAFT LEFT FEMORAL-POPLITEAL ARTERY WITH PROPATEN VASCULAR GRAFT;  Surgeon: Sherren Kerns, MD;  Location: [facility] OR;  Service: Vascular;  Laterality: Left;  . HEMATOMA EVACUATION Left 01/25/2018   Procedure: EVACUATION HEMATOMA LEFT POPLITEAL SPACE;  Surgeon: Cephus Shelling, MD;  Location: [facility] OR;  Service: Vascular;  Laterality: Left;  . LEG SURGERY     left leg - has rod and 4 pins  . PATELLAR TENDON REPAIR Right 04/26/2014   Procedure: RIGHT PATELLA TENDON REPAIR;  Surgeon: Sheral Apley, MD;  Location: [facility];  Service: Orthopedics;  Laterality: Right;     Current Meds  Medication Sig  . aspirin EC 81 MG tablet Take 1 tablet (81 mg total) by mouth daily.  Marland Kitchen lisinopril (ZESTRIL) 10 MG tablet TAKE 1 TABLET (10 MG TOTAL) DAILY. KEEP UPCOMING APPT IN MARCH 2022 BEFORE ANYMORE REFILLS.  Marland Kitchen metoprolol tartrate (LOPRESSOR) 25 MG tablet Take 1 tablet (25 mg total) by mouth 2 (two) times daily. Please keep upcoming appt in March 2022 before anymore refills. Thank you 3rd and Final Attempt  . nitroGLYCERIN (NITROSTAT) 0.4 MG SL tablet Place 1 tablet (0.4 mg total)  under the tongue every 5 (five) minutes as needed for chest pain.  . rosuvastatin (CRESTOR) 40 MG tablet Take 1 tablet (40 mg total) by mouth daily. Please keep upcoming appt in March 2022 before anymore refills. Thank you 3rd and Final Attempt  . warfarin (COUMADIN) 5 MG tablet TAKE 1 TO 1 & 1/2 (ONE & ONE-HALF) TABLETS BY MOUTH ONCE DAILY AS DIRECTED BY  COUMADIN  CLINIC     Allergies:   Ivp dye [iodinated diagnostic agents]   Social History   Tobacco Use  . Smoking status: Never Smoker  . Smokeless tobacco: Never Used  Substance Use Topics  . Alcohol use: Yes    Comment: Drinks occassionally  . Drug use: No     Family Hx: The patient's family history includes Cancer in his maternal aunt; Diabetes in an other family member; Heart disease in an other family member;  Hyperlipidemia in an other family member.  ROS:   Please see the history of present illness.    All other systems reviewed and are negative.   Prior CV studies:   The following studies were reviewed today:  Event monitor 06/2019  Sinus rhythm with sinus bradycardia  Rare premature ventricular contractions (PVCs)  Rare premature atrial contractions with two 4-5 beat runs of supraventricular tachycardia.   11/2018 Carotids  Summary:  Right Carotid: Velocities in the right ICA are consistent with a 1-39%  stenosis,         upper end of range.   Left Carotid: Velocities in the left ICA are consistent with a 1-39%  stenosis.   Vertebrals: Bilateral vertebral arteries demonstrate antegrade flow.  Subclavians: Normal flow hemodynamics were seen in bilateral subclavian        arteries.   *See table(s) above for measurements and observations.     Labs/Other Tests and Data Reviewed:    EKG:  An ECG dated 12/01/18 was personally reviewed today and demonstrated:  NSR 71bpm, occasional unifocal PVCs, LAFB, TWI avL, avR, ST upsloping V1-V5   Recent Labs: No results found for requested labs within last 8760 hours.   Recent Lipid Panel Lab Results  Component Value Date/Time   CHOL 133 08/11/2016 10:30 AM   TRIG 96 08/11/2016 10:30 AM   HDL 37 (L) 08/11/2016 10:30 AM   CHOLHDL 3.6 08/11/2016 10:30 AM   LDLCALC 77 08/11/2016 10:30 AM   LDLDIRECT 164.1 05/04/2013 11:07 AM    Wt Readings from Last 3 Encounters:  07/18/20 212 lb (96.2 kg)  07/08/20 214 lb 14.4 oz (97.5 kg)  07/23/19 223 lb (101.2 kg)     Objective:    Vital Signs:  BP (!) 142/80   Pulse 98   Ht 5\' 9"  (1.753 m)   Wt 212 lb (96.2 kg)   BMI 31.31 kg/m    VS reviewed. General - calm M in no acute distress Pulm - No labored breathing, no coughing during visit, no audible wheezing, speaking in full sentences Neuro - A+Ox3, no slurred speech, answers questions appropriately Psych - Pleasant  affect  ASSESSMENT & PLAN:    1. CAD, also in the context of Hyperlipidemia - asymptomatic from cardiac standpoint. Continue ASA, BB, statin as per previous doses. He has not had labs in nearly 2 years so will provide a #30 day fill for these things locally until we ensure he returns to the office for lab follow-up. Will obtain CBC, CMET, lipid profile and TSH. He originally requested to route the rx back to mail order  but since he is running out he really needs to get back on them ASAP, and I'm not sure [employer] will fill a 30 day rx or grant his rx since he had filled it and it's stuck at his roommate's house. We'll send to the [facility] he requested.  2. Essential HTN - BP suboptimally controlled but will refill lisinopril and metoprolol as above for 30 days, update labs as above, and reassess in person in physical follow-up. Given compliance issues, I do not think he is a great candidate for virtual care in person to be able to provide safe, adequate recommendations. Recommended to keep log of BP readings to bring to visit.  3. History of lower extremity DVT - Dr. Gibson Ramp last note indicated DVT was to be followed by vascular surgery but from notes I think the general consensus is that our office is managing this. Remote notes indicate recommendation for lifelong anticoagulation and was previously OK'd to consider switch to DOAC, but had never returned to Coumadin clinic to discuss this. Unfortunately the patient has not yet demonstrated compliance with the follow-up required to refill this safely. He has had numerous opportunities including short course provided this past fall. He also has not had labs since mid-2020. We will plan to bring him in for labs as above and arrange ASAP Coumadin clinic appointment. If his Hgb is stable, Cr is stable and he does attend that appointment, it would be reasonable to reinstate a 6 week fill of anticoagulation with 4 week anticoagulation follow-up in the interim.  Consideration could be given to DOAC given his lack of INR follow-up. He was originally intended to be on Xarelto but could not afford it and I think either this or Eliquis are good alternatives given his compliance issues with follow-up. Long term however I did suggest that he follow up with primary care for this diagnosis as I am not certain that Dr. Clifton James intended to be the primary person managing his h/o DVT. However, we are happy to help in any way we can to make sure he receives the care he needs in a safe manner. We discussed the risk of life threatening clotting and bleeding if he does not comply with follow-up going forward.   4. Palpitations (hx PACs, PVCs, SVT) - quiescent. Continue BB.   Time:   Today, I have spent 14 minutes with the patient with telehealth technology discussing the above problems.     Medication Adjustments/Labs and Tests Ordered: Current medicines are reviewed at length with the patient today.  Testing and concerns regarding medicines are outlined above.    Follow Up:  Coumadin clinic follow-up ASAP to demonstrate compliance and update labs, and then in-person APP follow-up in 2 weeks for physical assessment, recheck of BP and EKG.  Signed, Laurann Montana, PA-C  07/18/2020 10:24 AM    [facility]

## 2020-07-18 ENCOUNTER — Other Ambulatory Visit: Payer: Self-pay

## 2020-07-18 ENCOUNTER — Telehealth: Payer: Self-pay | Admitting: *Deleted

## 2020-07-18 ENCOUNTER — Encounter: Payer: Self-pay | Admitting: Physician Assistant

## 2020-07-18 ENCOUNTER — Telehealth (INDEPENDENT_AMBULATORY_CARE_PROVIDER_SITE_OTHER): Payer: Medicare HMO | Admitting: Physician Assistant

## 2020-07-18 VITALS — BP 142/80 | HR 98 | Ht 69.0 in | Wt 212.0 lb

## 2020-07-18 DIAGNOSIS — Z86718 Personal history of other venous thrombosis and embolism: Secondary | ICD-10-CM | POA: Diagnosis not present

## 2020-07-18 DIAGNOSIS — R002 Palpitations: Secondary | ICD-10-CM

## 2020-07-18 DIAGNOSIS — I2510 Atherosclerotic heart disease of native coronary artery without angina pectoris: Secondary | ICD-10-CM | POA: Diagnosis not present

## 2020-07-18 DIAGNOSIS — E785 Hyperlipidemia, unspecified: Secondary | ICD-10-CM | POA: Diagnosis not present

## 2020-07-18 DIAGNOSIS — I739 Peripheral vascular disease, unspecified: Secondary | ICD-10-CM

## 2020-07-18 DIAGNOSIS — I10 Essential (primary) hypertension: Secondary | ICD-10-CM | POA: Diagnosis not present

## 2020-07-18 MED ORDER — METOPROLOL TARTRATE 25 MG PO TABS: 25.0000 mg | ORAL_TABLET | Freq: Two times a day (BID) | ORAL | 0 refills | Status: DC

## 2020-07-18 MED ORDER — WARFARIN SODIUM 5 MG PO TABS: ORAL_TABLET | ORAL | 0 refills | Status: DC

## 2020-07-18 MED ORDER — ROSUVASTATIN CALCIUM 40 MG PO TABS: 40.0000 mg | ORAL_TABLET | Freq: Every day | ORAL | 0 refills | Status: DC

## 2020-07-18 MED ORDER — LISINOPRIL 10 MG PO TABS: ORAL_TABLET | ORAL | 0 refills | Status: DC

## 2020-07-18 NOTE — Patient Instructions (Signed)
Medication Instructions:  Your physician recommends that you continue on your current medications as directed. Please refer to the Current Medication list given to you today.  *If you need a refill on your cardiac medications before your next appointment, please call your pharmacy*   Lab Work: None today but will get some on your appointment on 08/04/2020 ARRIVE AT 9:00 FASTING   If you have labs (blood work) drawn today and your tests are completely normal, you will receive your results only by: Marland Kitchen MyChart Message (if you have MyChart) OR . A paper copy in the mail If you have any lab test that is abnormal or we need to change your treatment, we will call you to review the results.   Testing/Procedures: None ordered   Follow-Up: At [facility], you and your health needs are our priority.  As part of our continuing mission to provide you with exceptional heart care, we have created designated Provider Care Teams.  These Care Teams include your primary Cardiologist (physician) and Advanced Practice Providers (APPs -  Physician Assistants and Nurse Practitioners) who all work together to provide you with the care you need, when you need it.  We recommend signing up for the patient portal called "MyChart".  Sign up information is provided on this After Visit Summary.  MyChart is used to connect with patients for Virtual Visits (Telemedicine).  Patients are able to view lab/test results, encounter notes, upcoming appointments, etc.  Non-urgent messages can be sent to your provider as well.   To learn more about what you can do with MyChart, go to [email or web address].    Your next appointment:   08/04/2020 ARRIVE AT 9:00   The format for your next appointment:   In Person  Provider:   Norma Fredrickson, NP    I will have someone from Coumadin call you to schedule   Other Instructions

## 2020-07-18 NOTE — Telephone Encounter (Signed)
Called pt since the Anticoagulation Clinic received a message that he needs to be scheduled for Warfarin follow up, etc. Pt last seen on 07/23/2019. Pt states he can come to an appt next week and that he is out of Warfarin. Set an appt for pt on 07/24/20 and advised that when he picks up warfarin take: 2/5-1.5 tablets (extra half), 2/6-1.5 tablets (extra half), 2/7-1.5 tablets, 2/8-1 tablet, 2/9-1.5 tablets, 2/10-appt. Pt states he can only pick up warfarin tomorrow. He read the instructions back to me correctly. Also, he is aware that a 30 day supply will be evaluated at that time.  Pt has an appt scheduled with Lawson Fiscal on 08/04/20 and probably will need to be seen at that time again.

## 2020-07-18 NOTE — Telephone Encounter (Signed)
Spoke with Dayna, PA and was advised to have pt come in for an appt then resume warfarin at that time. Called the pharmacy and advised not to fill Warfarin but place it on hold and they verbalized understanding.  Called pt to advise on the changes and move the appt to Monday or Tuesday from Thursday but had to leave a message for him to call back to reschedule appt.

## 2020-07-18 NOTE — Telephone Encounter (Signed)
  Patient Consent for Virtual Visit         [name] has provided verbal consent on 07/18/2020 for a virtual visit (video or telephone).   CONSENT FOR VIRTUAL VISIT FOR:  Michael [name]  By participating in this virtual visit I agree to the following:  I hereby voluntarily request, consent and authorize [facility] and its employed or contracted physicians, physician assistants, nurse practitioners or other licensed health care professionals (the Practitioner), to provide me with telemedicine health care services (the "Services") as deemed necessary by the treating Practitioner. I acknowledge and consent to receive the Services by the Practitioner via telemedicine. I understand that the telemedicine visit will involve communicating with the Practitioner through live audiovisual communication technology and the disclosure of certain medical information by electronic transmission. I acknowledge that I have been given the opportunity to request an in-person assessment or other available alternative prior to the telemedicine visit and am voluntarily participating in the telemedicine visit.  I understand that I have the right to withhold or withdraw my consent to the use of telemedicine in the course of my care at any time, without affecting my right to future care or treatment, and that the Practitioner or I may terminate the telemedicine visit at any time. I understand that I have the right to inspect all information obtained and/or recorded in the course of the telemedicine visit and may receive copies of available information for a reasonable fee.  I understand that some of the potential risks of receiving the Services via telemedicine include:  Marland Kitchen Delay or interruption in medical evaluation due to technological equipment failure or disruption; . Information transmitted may not be sufficient (e.g. poor resolution of images) to allow for appropriate medical decision making by the  Practitioner; and/or  . In rare instances, security protocols could fail, causing a breach of personal health information.  Furthermore, I acknowledge that it is my responsibility to provide information about my medical history, conditions and care that is complete and accurate to the best of my ability. I acknowledge that Practitioner's advice, recommendations, and/or decision may be based on factors not within their control, such as incomplete or inaccurate data provided by me or distortions of diagnostic images or specimens that may result from electronic transmissions. I understand that the practice of medicine is not an exact science and that Practitioner makes no warranties or guarantees regarding treatment outcomes. I acknowledge that a copy of this consent can be made available to me via my patient portal [facility] MyChart), or I can request a printed copy by calling the office of [facility].    I understand that my insurance will be billed for this visit.   I have read or had this consent read to me. . I understand the contents of this consent, which adequately explains the benefits and risks of the Services being provided via telemedicine.  . I have been provided ample opportunity to ask questions regarding this consent and the Services and have had my questions answered to my satisfaction. . I give my informed consent for the services to be provided through the use of telemedicine in my medical care

## 2020-07-18 NOTE — Telephone Encounter (Signed)
Called and spoke with pt; made him aware that he will need to come to the Coumadin Clinic on 2/8 instead of 2/10. He stated that is fine and agreeable to move appt up. Confirmed appt with pt and he was going to call [employer] to set up his ride at this time. Also, advised to him that per [facility], [facility] we will not resume the warfarin until after our appt on 07/22/20 after we give him instructions, so he will not pick up the warfarin but please go pick up the others as previously instructed and he verbalized understanding. He states he can't go get those until tomorrow so he will do that.  Confirmed he had the appt for Tuesday 07/22/20 and Appt for 08/04/20 at 915am with Lawson Fiscal; he read all them back as he is keeping this in his home office.

## 2020-07-20 IMAGING — CR DG CHEST 2V
2 series · 2 of 2 positions shown · non-contrast
Comparison: None.

CLINICAL DATA: Right leg swelling since last night

EXAM:
CHEST - 2 VIEW

[chest lat]
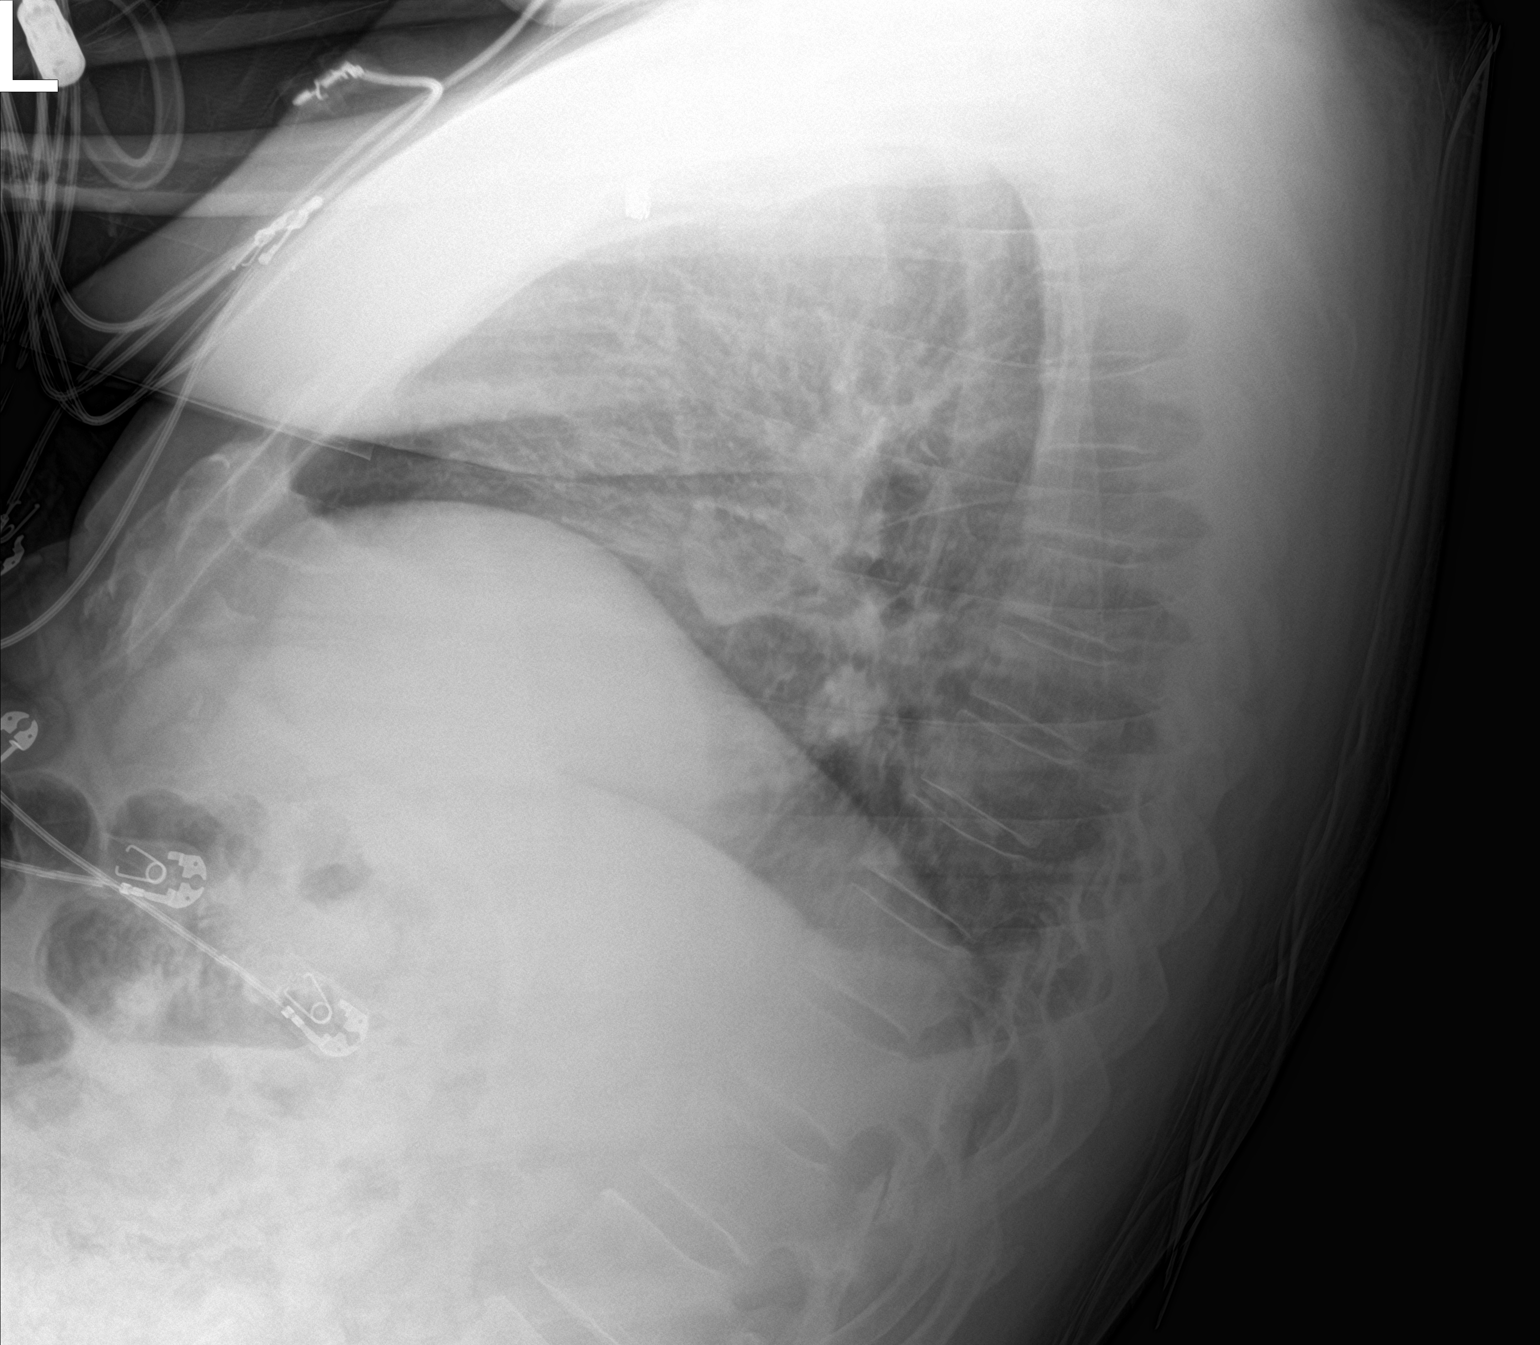

[chest ap]
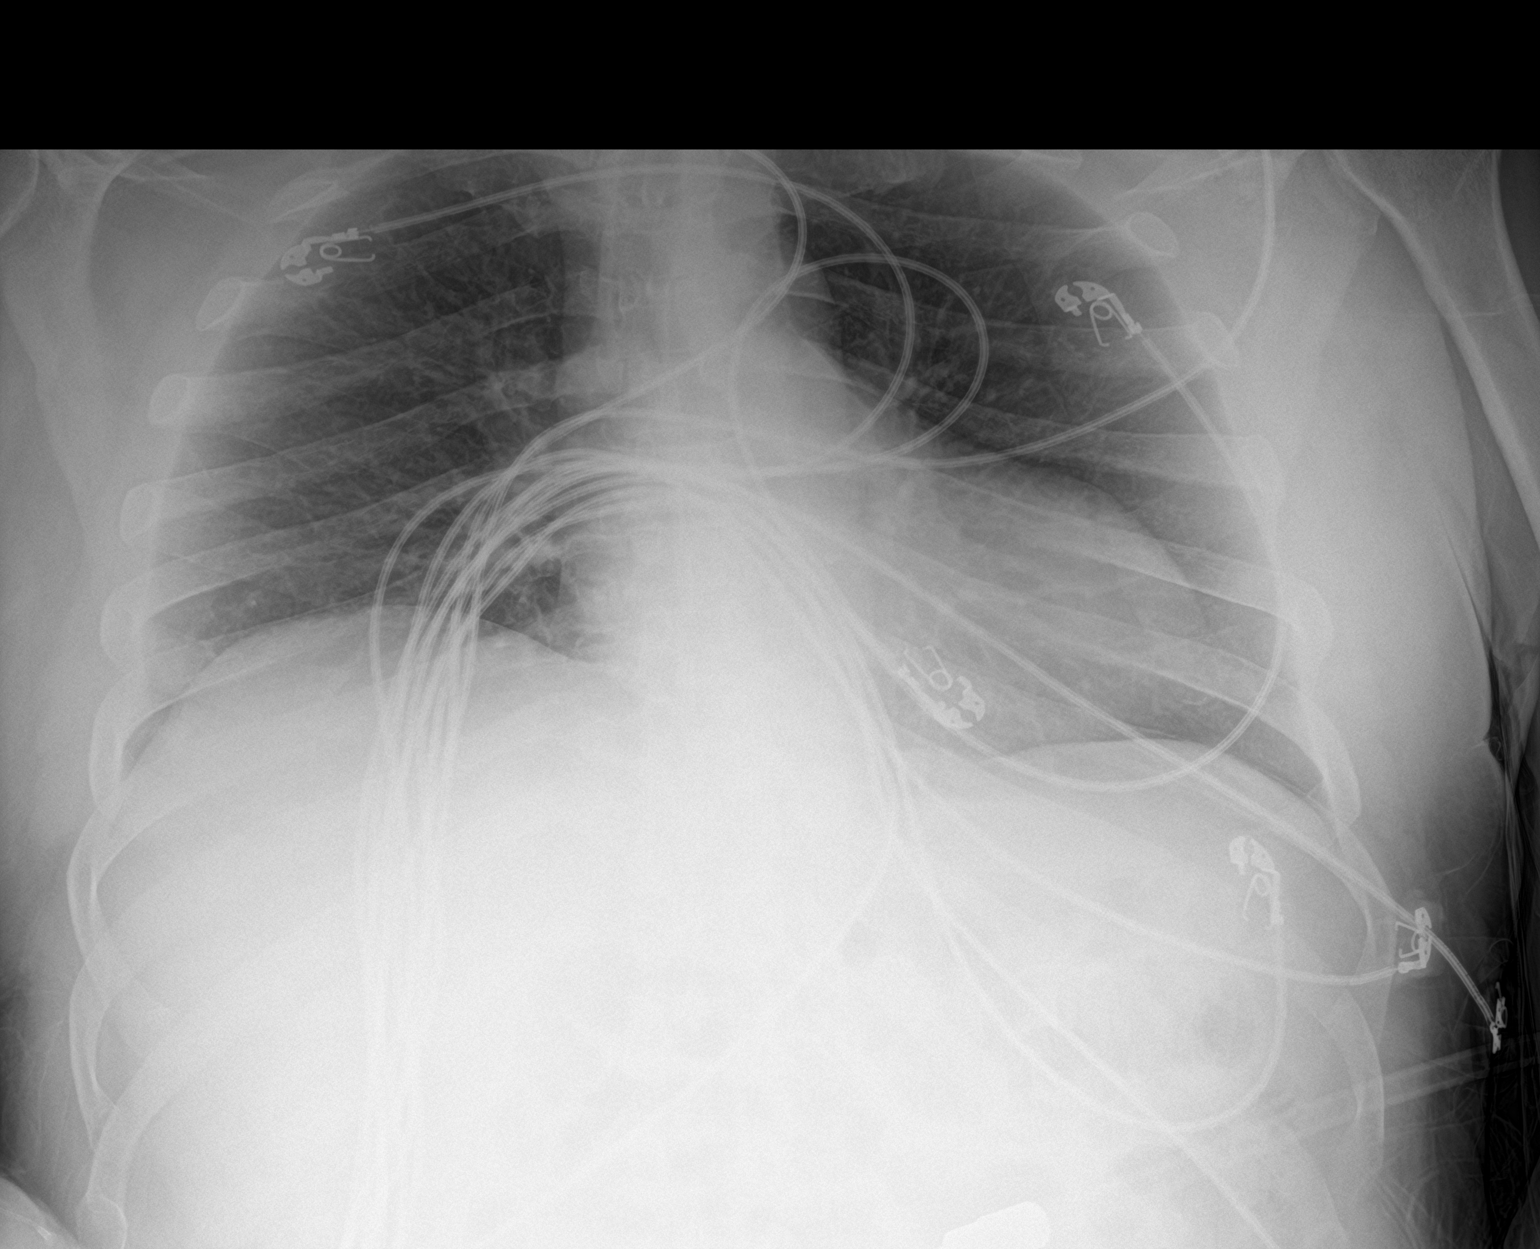

[2 of 2 positions shown; findings below may reference images not displayed]

FINDINGS: The heart size and mediastinal contours are within normal limits.
The lung volumes are low. Both lungs are clear. The visualized
skeletal structures are unremarkable.
IMPRESSION: No active cardiopulmonary disease.
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
## 2020-07-22 NOTE — Telephone Encounter (Signed)
Called pt since he missed the appt that was set up for today in the Anticoagulation Clinic at 1245pm; left a message for the pt to call back.

## 2020-07-24 ENCOUNTER — Other Ambulatory Visit: Payer: Self-pay

## 2020-07-24 MED ORDER — NITROGLYCERIN 0.4 MG SL SUBL: 0.4000 mg | SUBLINGUAL_TABLET | SUBLINGUAL | 0 refills | Status: DC | PRN

## 2020-07-28 ENCOUNTER — Other Ambulatory Visit: Payer: Self-pay

## 2020-07-28 MED ORDER — NITROGLYCERIN 0.4 MG SL SUBL
0.4000 mg | SUBLINGUAL_TABLET | SUBLINGUAL | 2 refills | Status: AC | PRN
Start: 2020-07-28 — End: ?

## 2020-07-31 ENCOUNTER — Ambulatory Visit: Payer: Medicare Other

## 2020-07-31 ENCOUNTER — Encounter (HOSPITAL_COMMUNITY): Payer: Medicare Other

## 2020-08-04 ENCOUNTER — Ambulatory Visit (INDEPENDENT_AMBULATORY_CARE_PROVIDER_SITE_OTHER): Payer: Medicare HMO | Admitting: Nurse Practitioner

## 2020-08-04 ENCOUNTER — Encounter: Payer: Self-pay | Admitting: Nurse Practitioner

## 2020-08-04 ENCOUNTER — Other Ambulatory Visit: Payer: Self-pay

## 2020-08-04 ENCOUNTER — Ambulatory Visit (INDEPENDENT_AMBULATORY_CARE_PROVIDER_SITE_OTHER): Payer: Medicare HMO | Admitting: *Deleted

## 2020-08-04 VITALS — BP 120/72 | HR 57 | Ht 69.0 in | Wt 215.4 lb

## 2020-08-04 DIAGNOSIS — Z86718 Personal history of other venous thrombosis and embolism: Secondary | ICD-10-CM

## 2020-08-04 DIAGNOSIS — I2510 Atherosclerotic heart disease of native coronary artery without angina pectoris: Secondary | ICD-10-CM

## 2020-08-04 DIAGNOSIS — I2699 Other pulmonary embolism without acute cor pulmonale: Secondary | ICD-10-CM

## 2020-08-04 DIAGNOSIS — R002 Palpitations: Secondary | ICD-10-CM

## 2020-08-04 DIAGNOSIS — I10 Essential (primary) hypertension: Secondary | ICD-10-CM

## 2020-08-04 DIAGNOSIS — R6889 Other general symptoms and signs: Secondary | ICD-10-CM | POA: Diagnosis not present

## 2020-08-04 DIAGNOSIS — I824Y1 Acute embolism and thrombosis of unspecified deep veins of right proximal lower extremity: Secondary | ICD-10-CM

## 2020-08-04 DIAGNOSIS — E785 Hyperlipidemia, unspecified: Secondary | ICD-10-CM | POA: Diagnosis not present

## 2020-08-04 DIAGNOSIS — Z5181 Encounter for therapeutic drug level monitoring: Secondary | ICD-10-CM

## 2020-08-04 LAB — BASIC METABOLIC PANEL
BUN/Creatinine Ratio: 17 (ref 9–20)
BUN: 21 mg/dL (ref 6–24)
CO2: 24 mmol/L (ref 20–29)
Calcium: 9.4 mg/dL (ref 8.7–10.2)
Chloride: 102 mmol/L (ref 96–106)
Creatinine, Ser: 1.23 mg/dL (ref 0.76–1.27)
GFR calc Af Amer: 74 mL/min/{1.73_m2} (ref >59)
GFR calc non Af Amer: 64 mL/min/{1.73_m2} (ref >59)
Glucose: 75 mg/dL (ref 65–99)
Potassium: 4.4 mmol/L (ref 3.5–5.2)
Sodium: 139 mmol/L (ref 134–144)

## 2020-08-04 LAB — HEPATIC FUNCTION PANEL
ALT: 33 IU/L (ref 0–44)
AST: 20 IU/L (ref 0–40)
Albumin: 4 g/dL (ref 3.8–4.9)
Alkaline Phosphatase: 81 IU/L (ref 44–121)
Bilirubin Total: 0.2 mg/dL (ref 0.0–1.2)
Bilirubin, Direct: <0.1 mg/dL (ref 0.00–0.40)
Total Protein: 6.6 g/dL (ref 6.0–8.5)

## 2020-08-04 LAB — CBC
Hematocrit: 45.4 % (ref 37.5–51.0)
Hemoglobin: 14.8 g/dL (ref 13.0–17.7)
MCH: 26.8 pg (ref 26.6–33.0)
MCHC: 32.6 g/dL (ref 31.5–35.7)
MCV: 82 fL (ref 79–97)
Platelets: 216 10*3/uL (ref 150–450)
RBC: 5.53 x10E6/uL (ref 4.14–5.80)
RDW: 12.3 % (ref 11.6–15.4)
WBC: 5.4 10*3/uL (ref 3.4–10.8)

## 2020-08-04 LAB — LIPID PANEL
Chol/HDL Ratio: 3.9 ratio (ref 0.0–5.0)
Cholesterol, Total: 137 mg/dL (ref 100–199)
HDL: 35 mg/dL — ABNORMAL LOW (ref >39)
LDL Chol Calc (NIH): 73 mg/dL (ref 0–99)
Triglycerides: 169 mg/dL — ABNORMAL HIGH (ref 0–149)
VLDL Cholesterol Cal: 29 mg/dL (ref 5–40)

## 2020-08-04 MED ORDER — WARFARIN SODIUM 5 MG PO TABS: ORAL_TABLET | ORAL | 0 refills | Status: DC

## 2020-08-04 NOTE — Patient Instructions (Addendum)
After Visit Summary:  We will be checking the following labs today - BMET, CBC, HPF and Lipids   Medication Instructions:    Continue with your current medicines.    If you need a refill on your cardiac medications before your next appointment, please call your pharmacy.     Testing/Procedures To Be Arranged:  N/A  Follow-Up:   See Dr. Clifton James in 6 months.     At [facility], you and your health needs are our priority.  As part of our continuing mission to provide you with exceptional heart care, we have created designated Provider Care Teams.  These Care Teams include your primary Cardiologist (physician) and Advanced Practice Providers (APPs -  Physician Assistants and Nurse Practitioners) who all work together to provide you with the care you need, when you need it.  Special Instructions:  . Stay safe, wash your hands for at least 20 seconds and wear a mask when needed.  . It was good to talk with you today.    Call the [facility] office at [phone] if you have any questions, problems or concerns.

## 2020-08-04 NOTE — Patient Instructions (Signed)
Description   Resume taking 1 tablet daily except 1.5 tablets on Mondays, Wednesdays and Fridays. Recheck INR in 1 week. Coumadin Clinic [phone]. Main [phone]

## 2020-08-05 NOTE — Progress Notes (Signed)
Pt has been made aware of normal result and verbalized understanding.  jw

## 2020-08-06 DIAGNOSIS — R6889 Other general symptoms and signs: Secondary | ICD-10-CM | POA: Diagnosis not present

## 2020-08-11 ENCOUNTER — Other Ambulatory Visit: Payer: Self-pay

## 2020-08-11 ENCOUNTER — Ambulatory Visit (INDEPENDENT_AMBULATORY_CARE_PROVIDER_SITE_OTHER): Payer: Medicare HMO | Admitting: *Deleted

## 2020-08-11 DIAGNOSIS — I824Y1 Acute embolism and thrombosis of unspecified deep veins of right proximal lower extremity: Secondary | ICD-10-CM

## 2020-08-11 DIAGNOSIS — Z5181 Encounter for therapeutic drug level monitoring: Secondary | ICD-10-CM | POA: Diagnosis not present

## 2020-08-11 DIAGNOSIS — I2699 Other pulmonary embolism without acute cor pulmonale: Secondary | ICD-10-CM

## 2020-08-11 DIAGNOSIS — R6889 Other general symptoms and signs: Secondary | ICD-10-CM | POA: Diagnosis not present

## 2020-08-11 LAB — POCT INR: INR: 1.9 — AB (ref 2.0–3.0)

## 2020-08-11 NOTE — Patient Instructions (Addendum)
Description    Take 2 tablets of warfarin today and then continue to take 1 tablet daily except for 1.5 tablets on Monday, Wednesday and Fridays. Recheck INR in 1 week. Coumadin Clinic [phone].

## 2020-08-18 ENCOUNTER — Ambulatory Visit (INDEPENDENT_AMBULATORY_CARE_PROVIDER_SITE_OTHER): Payer: Medicare HMO | Admitting: *Deleted

## 2020-08-18 ENCOUNTER — Other Ambulatory Visit: Payer: Self-pay

## 2020-08-18 DIAGNOSIS — R6889 Other general symptoms and signs: Secondary | ICD-10-CM | POA: Diagnosis not present

## 2020-08-18 DIAGNOSIS — I824Y1 Acute embolism and thrombosis of unspecified deep veins of right proximal lower extremity: Secondary | ICD-10-CM | POA: Diagnosis not present

## 2020-08-18 DIAGNOSIS — Z5181 Encounter for therapeutic drug level monitoring: Secondary | ICD-10-CM

## 2020-08-18 DIAGNOSIS — I2699 Other pulmonary embolism without acute cor pulmonale: Secondary | ICD-10-CM

## 2020-08-18 LAB — POCT INR: INR: 2.3 (ref 2.0–3.0)

## 2020-08-18 NOTE — Patient Instructions (Signed)
Description   Continue to take warfarin 1 tablet daily except for 1.5 tablets on Monday, Wednesday and Fridays. Recheck INR in 1 week. Coumadin Clinic [phone].

## 2020-08-19 ENCOUNTER — Telehealth: Payer: Self-pay | Admitting: Cardiovascular Disease

## 2020-08-19 ENCOUNTER — Other Ambulatory Visit: Payer: Self-pay | Admitting: Physician Assistant

## 2020-08-19 MED ORDER — METOPROLOL TARTRATE 25 MG PO TABS: 25.0000 mg | ORAL_TABLET | Freq: Two times a day (BID) | ORAL | 3 refills | Status: DC

## 2020-08-19 NOTE — Telephone Encounter (Signed)
Pt's medication was sent to pt's pharmacy as requested. Confirmation received.  °

## 2020-08-19 NOTE — Telephone Encounter (Signed)
*  STAT* If patient is at the pharmacy, call can be transferred to refill team.   1. Which medications need to be refilled? (please list name of each medication and dose if known)   metoprolol tartrate (LOPRESSOR) 25 MG tablet     2. Which pharmacy/location (including street and city if local pharmacy) is medication to be sent to? [facility] - [place], [place] - [address]  3. Do they need a 30 day or 90 day supply? 9 day supply

## 2020-08-20 DIAGNOSIS — R6889 Other general symptoms and signs: Secondary | ICD-10-CM | POA: Diagnosis not present

## 2020-08-20 DIAGNOSIS — H40013 Open angle with borderline findings, low risk, bilateral: Secondary | ICD-10-CM | POA: Diagnosis not present

## 2020-08-25 ENCOUNTER — Other Ambulatory Visit: Payer: Self-pay

## 2020-08-25 ENCOUNTER — Ambulatory Visit (INDEPENDENT_AMBULATORY_CARE_PROVIDER_SITE_OTHER): Payer: Medicare HMO

## 2020-08-25 DIAGNOSIS — R6889 Other general symptoms and signs: Secondary | ICD-10-CM | POA: Diagnosis not present

## 2020-08-25 DIAGNOSIS — I2699 Other pulmonary embolism without acute cor pulmonale: Secondary | ICD-10-CM | POA: Diagnosis not present

## 2020-08-25 DIAGNOSIS — I824Y1 Acute embolism and thrombosis of unspecified deep veins of right proximal lower extremity: Secondary | ICD-10-CM | POA: Diagnosis not present

## 2020-08-25 DIAGNOSIS — Z5181 Encounter for therapeutic drug level monitoring: Secondary | ICD-10-CM

## 2020-08-25 LAB — POCT INR: INR: 1.8 — AB (ref 2.0–3.0)

## 2020-08-25 NOTE — Patient Instructions (Signed)
-   take 2 tablets warfarin tonight,  - Continue to take warfarin 1 tablet daily except for 1.5 tablets on Monday, Wednesday and Fridays.  - Recheck INR in 2 weeks. Coumadin Clinic [phone].

## 2020-08-27 ENCOUNTER — Ambulatory Visit: Payer: Medicare Other | Admitting: Physician Assistant

## 2020-08-27 IMAGING — DX DG TIBIA/FIBULA 2V*R*
5 series · 5 of 5 positions shown · non-contrast
Comparison: RIGHT knee radiograph April 11, 2014.

CLINICAL DATA: Redness and swelling after RIGHT leg embolectomy.

EXAM:
RIGHT TIBIA AND FIBULA - 2 VIEW

[tibia ap (1 of 3)]
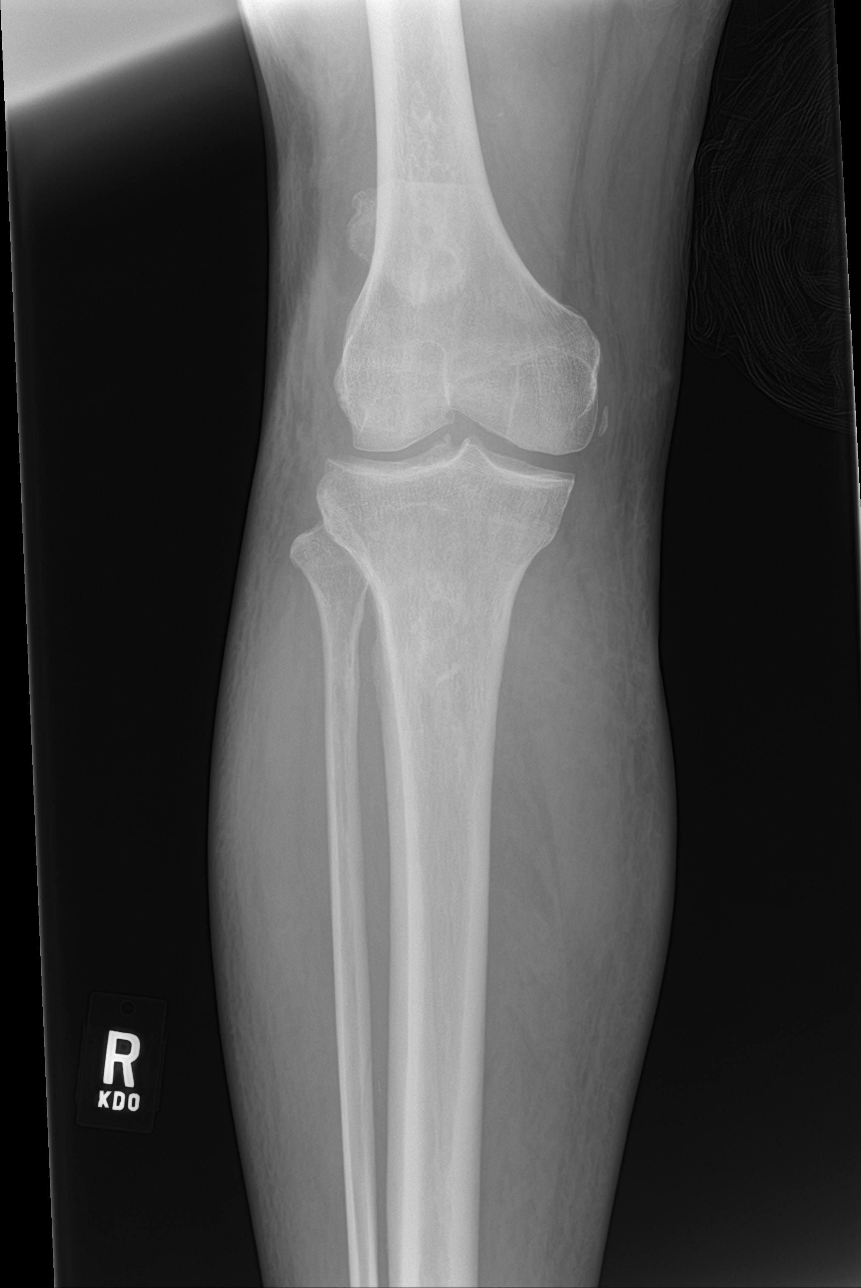

[tibia ap (2 of 3)]
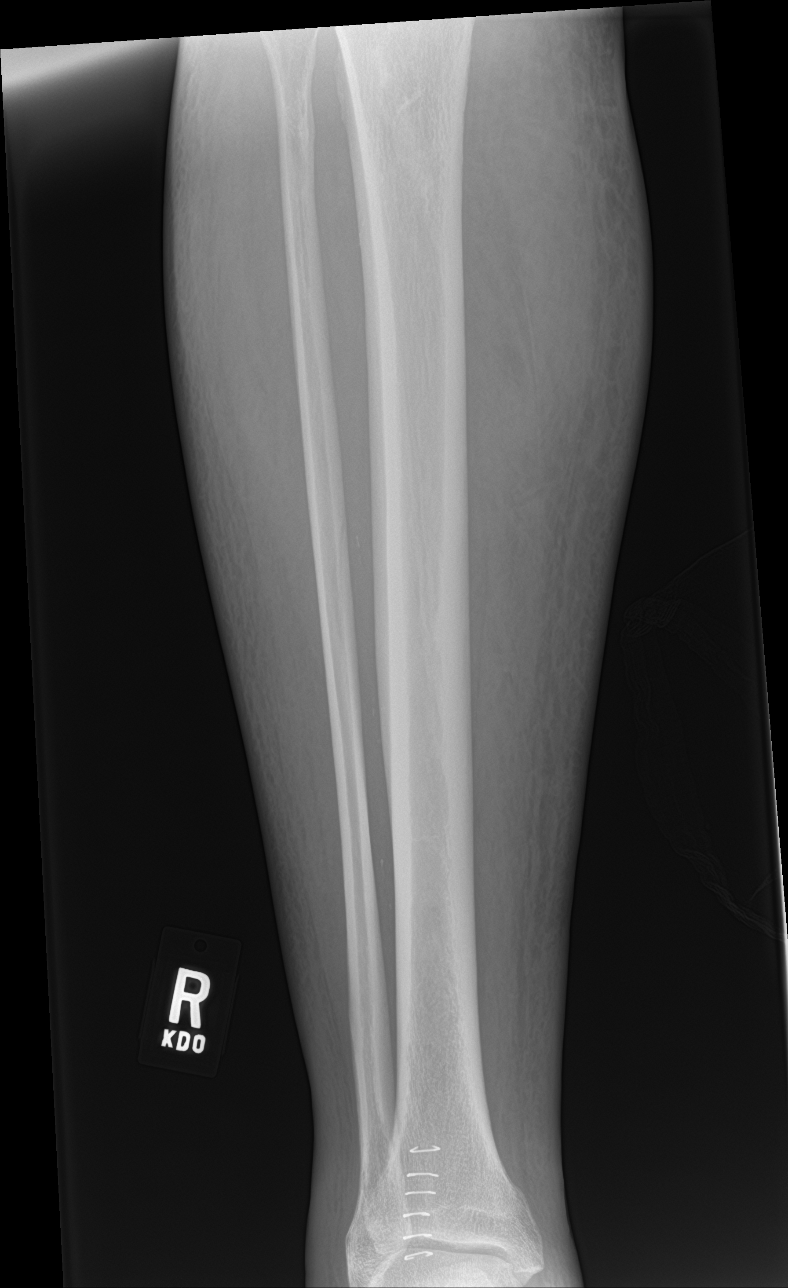

[tibia lat (1 of 2)]
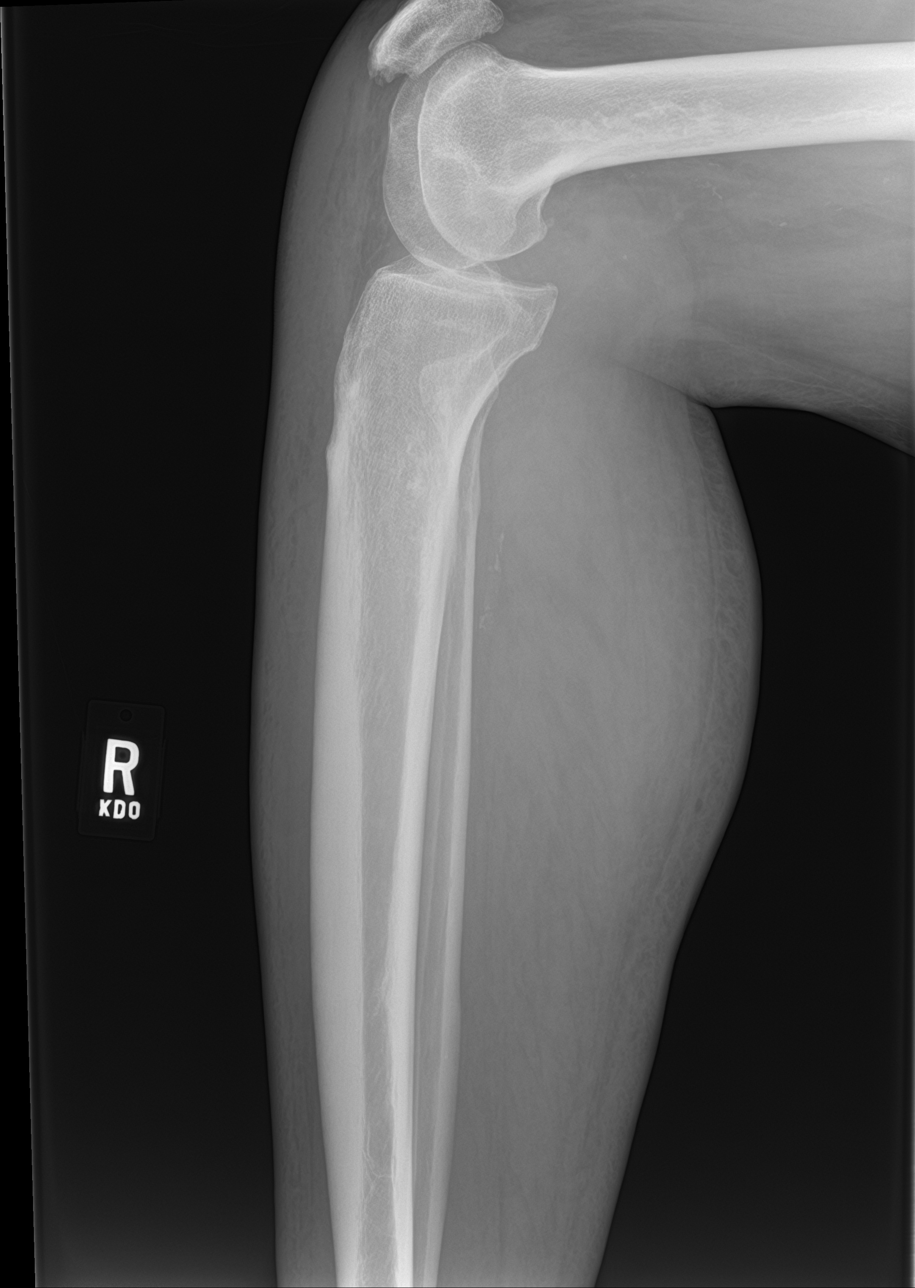

[tibia lat (2 of 2)]
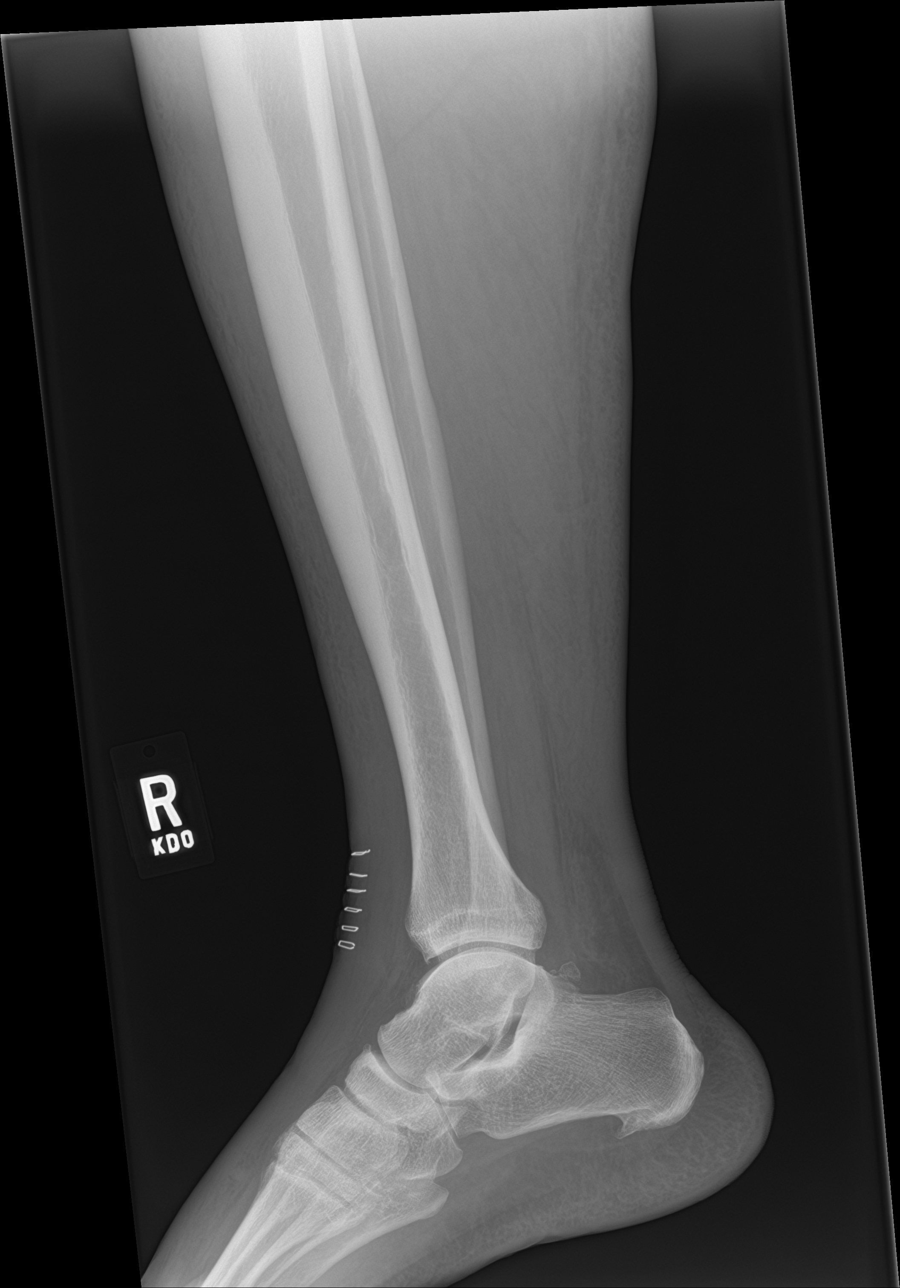

[tibia ap (3 of 3)]
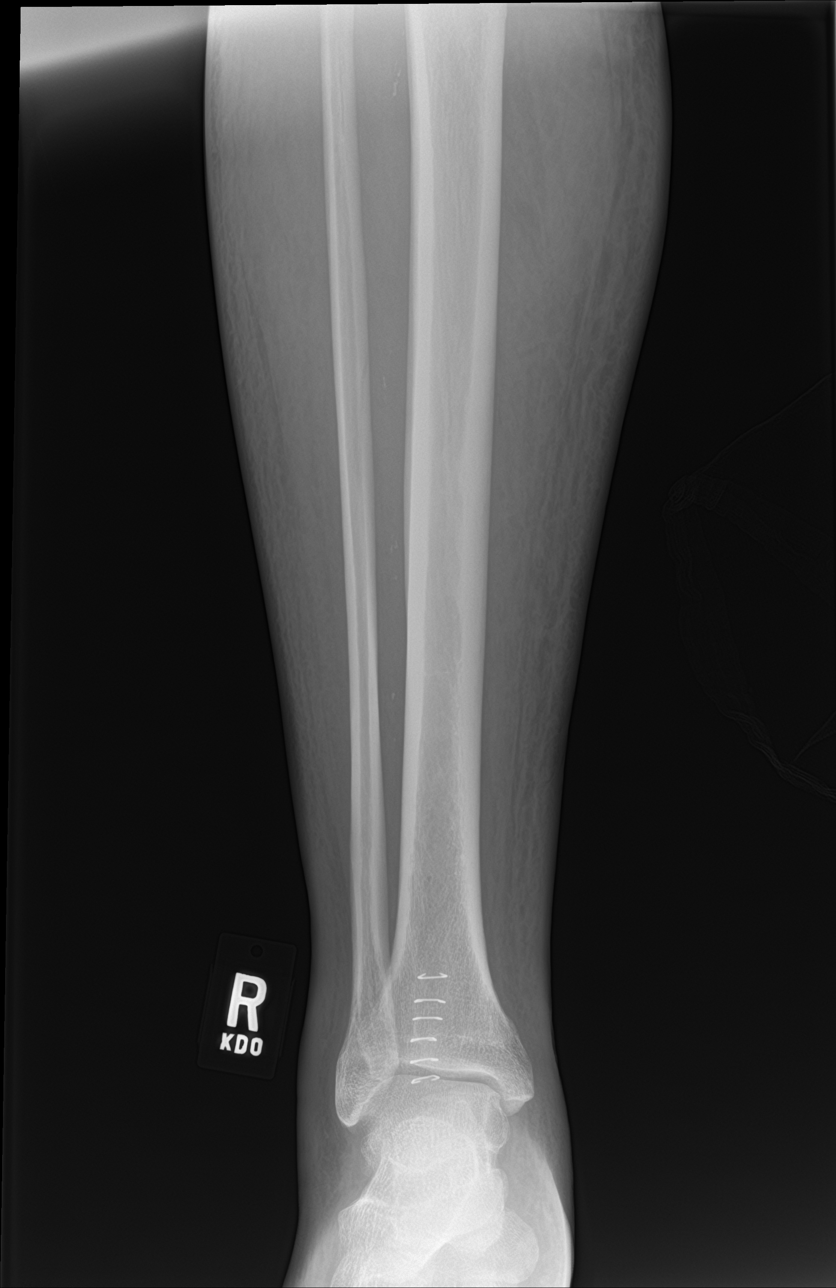

[5 of 5 positions shown; findings below may reference images not displayed]

FINDINGS: There is no evidence of fracture or other focal bone lesions.
Similar probable enchondroma distal femur. Patellofemoral
osteoarthrosis high-riding patella. Crescentic calcification medial
femoral epicondyle seen with old ligament injury. Pretibial soft
tissue swelling with skin staples anterior ankle. Mild vascular
calcifications. No subcutaneous gas.
IMPRESSION: 1. Soft tissue swelling without acute osseous process.

## 2020-08-29 ENCOUNTER — Other Ambulatory Visit: Payer: Self-pay | Admitting: Physician Assistant

## 2020-08-29 DIAGNOSIS — I739 Peripheral vascular disease, unspecified: Secondary | ICD-10-CM

## 2020-08-29 MED ORDER — ROSUVASTATIN CALCIUM 40 MG PO TABS: 40.0000 mg | ORAL_TABLET | Freq: Every day | ORAL | 3 refills | Status: DC

## 2020-08-29 MED ORDER — WARFARIN SODIUM 5 MG PO TABS: ORAL_TABLET | ORAL | 0 refills | Status: DC

## 2020-08-29 NOTE — Telephone Encounter (Signed)
*  STAT* If patient is at the pharmacy, call can be transferred to refill team.   1. Which medications need to be refilled? (please list name of each medication and dose if known)  warfarin (COUMADIN) 5 MG tablet rosuvastatin (CRESTOR) 40 MG tablet  2. Which pharmacy/location (including street and city if local pharmacy) is medication to be sent to? [facility] - [place], [place] - [address]  3. Do they need a 30 day or 90 day supply? 90

## 2020-08-29 NOTE — Telephone Encounter (Signed)
Prescription refill request received for warfarin  Lov: Gerhardt, 08/04/2020 Next INR check: 09/08/2020 Warfarin tablet strength: 5mg    Will send in a 1 month supply of warfarin to help with adherence to appointments.

## 2020-09-08 ENCOUNTER — Other Ambulatory Visit: Payer: Self-pay

## 2020-09-08 ENCOUNTER — Ambulatory Visit: Payer: Medicare HMO | Attending: Internal Medicine | Admitting: Internal Medicine

## 2020-09-08 DIAGNOSIS — L819 Disorder of pigmentation, unspecified: Secondary | ICD-10-CM | POA: Diagnosis not present

## 2020-09-08 DIAGNOSIS — I739 Peripheral vascular disease, unspecified: Secondary | ICD-10-CM

## 2020-09-08 DIAGNOSIS — M5416 Radiculopathy, lumbar region: Secondary | ICD-10-CM
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
## 2020-09-12 ENCOUNTER — Other Ambulatory Visit: Payer: Self-pay

## 2020-09-12 ENCOUNTER — Ambulatory Visit (INDEPENDENT_AMBULATORY_CARE_PROVIDER_SITE_OTHER): Payer: Medicare HMO

## 2020-09-12 DIAGNOSIS — R6889 Other general symptoms and signs: Secondary | ICD-10-CM | POA: Diagnosis not present

## 2020-09-12 DIAGNOSIS — I824Y1 Acute embolism and thrombosis of unspecified deep veins of right proximal lower extremity: Secondary | ICD-10-CM

## 2020-09-12 DIAGNOSIS — I2699 Other pulmonary embolism without acute cor pulmonale: Secondary | ICD-10-CM | POA: Diagnosis not present

## 2020-09-12 DIAGNOSIS — Z5181 Encounter for therapeutic drug level monitoring: Secondary | ICD-10-CM

## 2020-09-12 LAB — POCT INR: INR: 4.7 — AB (ref 2.0–3.0)

## 2020-09-12 NOTE — Patient Instructions (Signed)
-   skip warfarin tonight & tomorrow, then  -  resume warfarin 1 tablet daily except for 1.5 tablets on Monday, Wednesday and Fridays.  - Recheck INR in 10 days Coumadin Clinic [phone].

## 2020-09-23 DIAGNOSIS — R6889 Other general symptoms and signs: Secondary | ICD-10-CM | POA: Diagnosis not present

## 2020-09-30 ENCOUNTER — Ambulatory Visit: Payer: Medicare HMO | Admitting: Internal Medicine

## 2020-10-02 DIAGNOSIS — R6889 Other general symptoms and signs: Secondary | ICD-10-CM | POA: Diagnosis not present

## 2020-10-03 ENCOUNTER — Other Ambulatory Visit: Payer: Self-pay | Admitting: Physician Assistant

## 2020-10-03 NOTE — Telephone Encounter (Signed)
Prescription refill request received for warfarin Lov: Michael [name] 08/04/2020 Next INR check: 4/26 Warfarin tablet strength: 5mg 

## 2020-10-07 ENCOUNTER — Ambulatory Visit (INDEPENDENT_AMBULATORY_CARE_PROVIDER_SITE_OTHER): Payer: Medicare HMO | Admitting: *Deleted

## 2020-10-07 ENCOUNTER — Other Ambulatory Visit: Payer: Self-pay

## 2020-10-07 DIAGNOSIS — I2699 Other pulmonary embolism without acute cor pulmonale: Secondary | ICD-10-CM | POA: Diagnosis not present

## 2020-10-07 DIAGNOSIS — I824Y1 Acute embolism and thrombosis of unspecified deep veins of right proximal lower extremity: Secondary | ICD-10-CM | POA: Diagnosis not present

## 2020-10-07 DIAGNOSIS — Z5181 Encounter for therapeutic drug level monitoring: Secondary | ICD-10-CM | POA: Diagnosis not present

## 2020-10-07 DIAGNOSIS — R6889 Other general symptoms and signs: Secondary | ICD-10-CM | POA: Diagnosis not present

## 2020-10-07 LAB — POCT INR: INR: 1.7 — AB (ref 2.0–3.0)

## 2020-10-07 NOTE — Patient Instructions (Addendum)
Description   Today take 1.5 tablets then continue taking Warfarin 1 tablet daily except for 1.5 tablets on Monday, Wednesday and Fridays. Recheck INR in 2 weeks. Coumadin Clinic [phone].

## 2020-10-28 ENCOUNTER — Other Ambulatory Visit: Payer: Self-pay

## 2020-10-28 ENCOUNTER — Ambulatory Visit (INDEPENDENT_AMBULATORY_CARE_PROVIDER_SITE_OTHER): Payer: Medicare HMO | Admitting: *Deleted

## 2020-10-28 DIAGNOSIS — I2699 Other pulmonary embolism without acute cor pulmonale: Secondary | ICD-10-CM

## 2020-10-28 DIAGNOSIS — Z5181 Encounter for therapeutic drug level monitoring: Secondary | ICD-10-CM

## 2020-10-28 DIAGNOSIS — I824Y1 Acute embolism and thrombosis of unspecified deep veins of right proximal lower extremity: Secondary | ICD-10-CM | POA: Diagnosis not present

## 2020-10-28 LAB — POCT INR: INR: 2.3 (ref 2.0–3.0)

## 2020-10-28 NOTE — Patient Instructions (Signed)
Description   Continue taking Warfarin 1 tablet daily except for 1.5 tablets on Monday, Wednesday and Fridays. Recheck INR in 3 weeks. Coumadin Clinic [phone].

## 2020-11-05 ENCOUNTER — Other Ambulatory Visit: Payer: Self-pay | Admitting: Physician Assistant

## 2020-11-05 NOTE — Telephone Encounter (Signed)
Prescription refill request received for warfarin Lov: Gerhardt, 08/04/2020 Next INR check: 6/7 Warfarin tablet strength:5mg 

## 2020-11-13 ENCOUNTER — Encounter: Payer: Self-pay | Admitting: Physician Assistant

## 2020-11-13 ENCOUNTER — Ambulatory Visit: Payer: Medicare HMO | Attending: Internal Medicine | Admitting: Physician Assistant

## 2020-11-13 ENCOUNTER — Other Ambulatory Visit: Payer: Self-pay

## 2020-11-13 VITALS — BP 152/95 | HR 64 | Resp 16 | Wt 234.8 lb

## 2020-11-13 DIAGNOSIS — M5416 Radiculopathy, lumbar region: Secondary | ICD-10-CM

## 2020-11-13 DIAGNOSIS — L819 Disorder of pigmentation, unspecified: Secondary | ICD-10-CM

## 2020-11-13 DIAGNOSIS — R5381 Other malaise: Secondary | ICD-10-CM

## 2020-11-13 MED ORDER — GABAPENTIN 300 MG PO CAPS: 300.0000 mg | ORAL_CAPSULE | Freq: Every day | ORAL | 3 refills | Status: DC

## 2020-11-13 NOTE — Progress Notes (Signed)
Pt states he is having left leg pain

## 2020-11-13 NOTE — Patient Instructions (Signed)
Pt states he is ahving left leg pain

## 2020-11-13 NOTE — Progress Notes (Signed)
Michael [name], is a 60 y.o. male  IRJ:[id number]  TKZ:[id number]  DOB - 1961/01/12  Subjective:  Chief Complaint and HPI: Michael [name] is a 60 y.o. male here today for several reasons.  -Needs a handicapped placard. He uses a cane and is unable to walk more than about 50 feet without stopping to rest -continuing to have L leg pain and low back pain.  Never went for xrays that Dr Laural Benes had ordered.  The pain in his leg sometimes keeps him from sleeping at night and he does have paresthesias at times.   -he has some darkened skin on his nose since having to wear masks the last couple of years.  No itching.  No bleeding.     ROS:   Constitutional:  No f/c, No night sweats, No unexplained weight loss. EENT:  No vision changes, No blurry vision, No hearing changes. No mouth, throat, or ear problems.  Respiratory: No cough, No SOB Cardiac: No CP, no palpitations GI:  No abd pain, No N/V/D. GU: No Urinary s/sx Musculoskeletal: see above Neuro: No headache, no dizziness, no motor weakness.  Skin: see above Endocrine:  No polydipsia. No polyuria.  Psych: Denies SI/HI  No problems updated.  ALLERGIES: Allergies  Allergen Reactions  . Ivp Dye [Iodinated Diagnostic Agents] Swelling    PAST MEDICAL HISTORY: Past Medical History:  Diagnosis Date  . Alcohol abuse   . Anterior myocardial infarction [facility])    ST-elevation; S/P emergent  drug-eluting stenting of proximal left anterior descending  . Coronary artery disease    a. STEMI 2012 - s/p DES to LAD with  diffuse distal disease in the right posterolateral branch. The third OM was occluded and filled from left to left collaterals. LVEF preserved.  Marland Kitchen DVT complicating pregnancy   . Dysuria 04/26/2013  . Essential hypertension 05/16/2014  . Frequent urination 09/03/2014  . HNP (herniated nucleus pulposus), cervical 10/14/2014  . Hyperlipidemia   . Hypertension   . Ischemia of extremity 01/23/2018  . Lower abdominal pain  09/03/2014  . PAD (peripheral artery disease) (HCC)   . Pre-diabetes   . Premature atrial contraction   . PSVT (paroxysmal supraventricular tachycardia) (HCC)    2 short runs on monitor 06/2019  . Pulmonary emboli (HCC)   . PVC's (premature ventricular contractions)   . Renal insufficiency   . Special screening for malignant neoplasms, colon     MEDICATIONS AT HOME: Prior to Admission medications   Medication Sig Start Date End Date Taking? Authorizing Provider  gabapentin (NEURONTIN) 300 MG capsule Take 1 capsule (300 mg total) by mouth at bedtime. 11/13/20  Yes Anders Simmonds, PA-C  aspirin EC 81 MG tablet Take 1 tablet (81 mg total) by mouth daily. 06/21/19   Kathleene Hazel, MD  lisinopril (ZESTRIL) 10 MG tablet TAKE 1 TABLET (10 MG TOTAL) DAILY. 07/18/20   Dunn, Tacey Ruiz, PA-C  metoprolol tartrate (LOPRESSOR) 25 MG tablet Take 1 tablet (25 mg total) by mouth 2 (two) times daily. 08/19/20   Kathleene Hazel, MD  nitroGLYCERIN (NITROSTAT) 0.4 MG SL tablet Place 1 tablet (0.4 mg total) under the tongue every 5 (five) minutes as needed for chest pain. 07/28/20   Kathleene Hazel, MD  rosuvastatin (CRESTOR) 40 MG tablet Take 1 tablet (40 mg total) by mouth daily. 08/29/20   Kathleene Hazel, MD  warfarin (COUMADIN) 5 MG tablet TAKE 1 TO 1 & 1/2 (ONE & ONE-HALF) TABLETS BY MOUTH ONCE DAILY AS DIRECTED BY  COUMADIN  CLINIC 11/05/20   Dunn, Kriste Basque N, PA-C     Objective:  EXAM:   Vitals:   11/13/20 0841  BP: (!) 152/95  Pulse: 64  Resp: 16  SpO2: 97%  Weight: 234 lb 12.8 oz (106.5 kg)    General appearance : A&OX3. NAD. Non-toxic-appearing-uses a cane HEENT: Atraumatic and Normocephalic.  PERRLA. EOM intact.  Neck: supple, no JVD. No cervical lymphadenopathy. No thyromegaly Chest/Lungs:  Breathing-non-labored, Good air entry bilaterally, breath sounds normal without rales, rhonchi, or wheezing  CVS: S1 S2 regular, no murmurs, gallops, rubs  Extremities: Bilateral  Lower Ext shows no edema, both legs are warm to touch with = pulse throughout Neurology:  CN II-XII grossly intact, Non focal.   Psych:  TP linear. J/I WNL. Normal speech. Appropriate eye contact and affect.  Skin:  3X4 hyperpigmented lesion on nose.  Flat.  No crusting or bleeding  Data Review Lab Results  Component Value Date   HGBA1C 6.2 (H) 01/24/2018   HGBA1C 6.1 07/06/2017   HGBA1C 6.4 (H) 03/23/2017     Assessment & Plan   1. Hyperpigmentation - Ambulatory referral to Dermatology  2. Chronic left-sided lumbar radiculopathy - DG Lumbar Spine Complete; Future - gabapentin (NEURONTIN) 300 MG capsule; Take 1 capsule (300 mg total) by mouth at bedtime.  Dispense: 90 capsule; Refill: 3  3. Debility 5 year handicap placard filled out and given  Patient have been counseled extensively about nutrition and exercise  Return for keep 6/23 appt with Dr Laural Benes.  The patient was given clear instructions to go to ER or return to medical center if symptoms don't improve, worsen or new problems develop. The patient verbalized understanding. The patient was told to call to get lab results if they haven't heard anything in the next week.     Georgian Co, PA-C [facility] [place], [place] [phone]   11/13/2020, 9:25 AMPatient ID: Michael [name], male   DOB: 1961-04-08, 60 y.o.   MRN: [id number]

## 2020-11-20 ENCOUNTER — Ambulatory Visit (HOSPITAL_COMMUNITY)
Admission: RE | Admit: 2020-11-20 | Discharge: 2020-11-20 | Disposition: A | Discharge status: Home / Self Care | Payer: Medicare HMO | Source: Ambulatory Visit | Attending: Physician Assistant | Admitting: Physician Assistant

## 2020-11-20 ENCOUNTER — Other Ambulatory Visit: Payer: Self-pay

## 2020-11-20 DIAGNOSIS — M5416 Radiculopathy, lumbar region: Secondary | ICD-10-CM

## 2020-11-20 DIAGNOSIS — M545 Low back pain, unspecified: Secondary | ICD-10-CM | POA: Diagnosis not present

## 2020-11-25 ENCOUNTER — Ambulatory Visit (INDEPENDENT_AMBULATORY_CARE_PROVIDER_SITE_OTHER): Payer: Medicare HMO | Admitting: *Deleted

## 2020-11-25 ENCOUNTER — Other Ambulatory Visit: Payer: Self-pay

## 2020-11-25 DIAGNOSIS — I824Y1 Acute embolism and thrombosis of unspecified deep veins of right proximal lower extremity: Secondary | ICD-10-CM | POA: Diagnosis not present

## 2020-11-25 DIAGNOSIS — I2699 Other pulmonary embolism without acute cor pulmonale: Secondary | ICD-10-CM

## 2020-11-25 DIAGNOSIS — Z5181 Encounter for therapeutic drug level monitoring: Secondary | ICD-10-CM | POA: Diagnosis not present

## 2020-11-25 LAB — POCT INR: INR: 2.1 (ref 2.0–3.0)

## 2020-11-25 NOTE — Patient Instructions (Signed)
Description   Continue taking Warfarin 1 tablet daily except for 1.5 tablets on Monday, Wednesday and Fridays. Recheck INR in 4 weeks. [facility] [phone].

## 2020-11-26 ENCOUNTER — Other Ambulatory Visit: Payer: Self-pay | Admitting: Physician Assistant

## 2020-11-26 DIAGNOSIS — M5416 Radiculopathy, lumbar region: Secondary | ICD-10-CM

## 2020-11-26 DIAGNOSIS — R9389 Abnormal findings on diagnostic imaging of other specified body structures: Secondary | ICD-10-CM

## 2020-11-26 DIAGNOSIS — M545 Low back pain, unspecified: Secondary | ICD-10-CM

## 2020-11-27 ENCOUNTER — Ambulatory Visit: Payer: Medicare HMO | Admitting: Pharmacist

## 2020-11-27 ENCOUNTER — Other Ambulatory Visit: Payer: Self-pay

## 2020-11-27 NOTE — Progress Notes (Signed)
Pt name and DOB verified. Patient aware of results and result note per Angela McClung, PA-C 

## 2020-12-02 ENCOUNTER — Other Ambulatory Visit: Payer: Self-pay

## 2020-12-02 ENCOUNTER — Encounter: Payer: Self-pay | Admitting: Family Medicine

## 2020-12-02 ENCOUNTER — Ambulatory Visit (INDEPENDENT_AMBULATORY_CARE_PROVIDER_SITE_OTHER): Payer: Medicare HMO | Admitting: Family Medicine

## 2020-12-02 DIAGNOSIS — G8929 Other chronic pain: Secondary | ICD-10-CM

## 2020-12-02 DIAGNOSIS — M5442 Lumbago with sciatica, left side: Secondary | ICD-10-CM

## 2020-12-02 NOTE — Progress Notes (Signed)
Office Visit Note   Patient: Michael [name]           Date of Birth: 1961/05/15           MRN: [id number] Visit Date: 12/02/2020 Requested by: Anders Simmonds, PA-C [address] [place],  [place] [address] PCP: Marcine Matar, MD  Subjective: Chief Complaint  Patient presents with   Lower Back - Pain    Chronic low back pain. Radiates down the left leg to the foot. Numbness in both feet since surgery for blood clots in the lower legs in 2019. Had lsp xray at [facility] per PCP.    HPI: He is here with low back pain.  Longstanding pain in his back, mostly in the midline but with occasional radiation down the left leg to the foot.  He has had blood clots in both legs in the past treated surgically in 2019 and has numbness in both feet ever since then.  He does not have any numbness that is definitely related to his spine.  He had x-rays done recently showing significant degenerative disc disease from L2-S1.  He now presents for evaluation.  He has not been to physical therapy for this.  He has never been to a [facility].  He takes Tylenol for pain, cannot take NSAIDs due to chronic anticoagulant use.  He states that he has nocturia with about 10 episodes per night, but this is chronic for him and does not seem to be related to his current symptoms.                ROS:   All other systems were reviewed and are negative.  Objective: Vital Signs: There were no vitals taken for this visit.  Physical Exam:  General:  Alert and oriented, in no acute distress. Pulm:  Breathing unlabored. Psy:  Normal mood, congruent affect.  Low back: This pain is not reproducible by palpation today.  He points to the L5-S1 area up to the L2 area as the location of his symptoms.  No pain over the SI joints or in the sciatic notch.  No pain with internal hip rotation.  Straight leg raises negative for pain but positive for tight hamstrings and heel cords.  Lower extremity strength is  normal.  Imaging: No results found.  Assessment & Plan: Chronic low back pain with spondylosis, question spinal stenosis. -Discussed options with him and elected to try physical therapy.  If he fails to improve, consider MRI scan followed by epidural injection if indicated.     Procedures: No procedures performed        PMFS History: Patient Active Problem List   Diagnosis Date Noted   DVT, lower extremity (HCC) 03/14/2019   Pulmonary embolism (HCC) 03/14/2019   History of pulmonary embolism 09/11/2018   Obsessive-compulsive disorder    Muscle cramps    Postoperative pain    Therapeutic opioid induced constipation    CKD (chronic kidney disease), stage II    Acute blood loss anemia    Debility    Ischemia of extremity 01/23/2018   Frequency of urination and polyuria 07/06/2017   Erectile dysfunction due to diseases classified elsewhere 10/27/2016   PAD (peripheral artery disease) (HCC) 08/11/2016   Nocturia 08/11/2016   Chronic pain of both knees 04/01/2016   Palpitations 05/05/2015   Hyperlipidemia 05/05/2015   HNP (herniated nucleus pulposus), cervical 10/14/2014   Chronic low back pain 10/14/2014   Numbness and tingling of both upper extremities while  sleeping 10/14/2014   Frequent urination 09/03/2014   Lower abdominal pain 09/03/2014   Cervical stenosis of spine 05/16/2014   Essential hypertension 05/16/2014   Numbness and tingling of right arm 11/28/2013   Dental abscess 04/26/2013   Dysuria 04/26/2013   HTN (hypertension) 04/04/2013   CAD (coronary artery disease) 05/25/2011   Past Medical History:  Diagnosis Date   Alcohol abuse    Anterior myocardial infarction [facility])    ST-elevation; S/P emergent  drug-eluting stenting of proximal left anterior descending   Coronary artery disease    a. STEMI 2012 - s/p DES to LAD with  diffuse distal disease in the right posterolateral branch. The third OM was occluded and filled from left to left collaterals. LVEF  preserved.   DVT complicating pregnancy    Dysuria 04/26/2013   Essential hypertension 05/16/2014   Frequent urination 09/03/2014   HNP (herniated nucleus pulposus), cervical 10/14/2014   Hyperlipidemia    Hypertension    Ischemia of extremity 01/23/2018   Lower abdominal pain 09/03/2014   PAD (peripheral artery disease) (HCC)    Pre-diabetes    Premature atrial contraction    PSVT (paroxysmal supraventricular tachycardia) (HCC)    2 short runs on monitor 06/2019   Pulmonary emboli (HCC)    PVC's (premature ventricular contractions)    Renal insufficiency    Special screening for malignant neoplasms, colon     Family History  Problem Relation Age of Onset   Cancer Maternal Aunt    Hyperlipidemia Other    Diabetes Other    Heart disease Other     Past Surgical History:  Procedure Laterality Date   COLONOSCOPY N/A 08/16/2014   Procedure: COLONOSCOPY;  Surgeon: West Bali, MD;  [facility];  Service: Endoscopy;  Laterality: N/A;  10:15 AM   DOPPLER ECHOCARDIOGRAPHY     Preserved left venticular function   EMBOLECTOMY Right 01/23/2018   Procedure: RIGHT POPLITEAL-TIBIAL  ARTERY EMBOLECTOMY, EXPLORATION OF RIGHT ANTERIOR TIBIAL ARTERY, RIGHT POPLITEAL ARTERY VEIN ANGIOPLASTY;  Surgeon: Sherren Kerns, MD;  Location: [facility] OR;  Service: Vascular;  Laterality: Right;   ENDARTERECTOMY POPLITEAL Left 01/23/2018   Procedure: ENDARTERECTOMY OF LEFT POPLITEAL ARTERY AND TIBIAL-PERONEAL TRUNK;  Surgeon: Sherren Kerns, MD;  Location: [facility] OR;  Service: Vascular;  Laterality: Left;   FEMORAL-POPLITEAL BYPASS GRAFT Left 01/23/2018   Procedure: BYPASS GRAFT LEFT FEMORAL-POPLITEAL ARTERY WITH PROPATEN VASCULAR GRAFT;  Surgeon: Sherren Kerns, MD;  Location: [facility] OR;  Service: Vascular;  Laterality: Left;   HEMATOMA EVACUATION Left 01/25/2018   Procedure: EVACUATION HEMATOMA LEFT POPLITEAL SPACE;  Surgeon: Cephus Shelling, MD;  Location: [facility] OR;  Service: Vascular;  Laterality: Left;    LEG SURGERY     left leg - has rod and 4 pins   PATELLAR TENDON REPAIR Right 04/26/2014   Procedure: RIGHT PATELLA TENDON REPAIR;  Surgeon: Sheral Apley, MD;  Location: [facility];  Service: Orthopedics;  Laterality: Right;   Social History   Occupational History   Not on file  Tobacco Use   Smoking status: Never   Smokeless tobacco: Never  Substance and Sexual Activity   Alcohol use: Yes    Comment: Drinks occassionally   Drug use: No   Sexual activity: Not on file

## 2020-12-03 ENCOUNTER — Other Ambulatory Visit: Payer: Self-pay | Admitting: Physician Assistant

## 2020-12-04 ENCOUNTER — Encounter: Payer: Self-pay | Admitting: Internal Medicine

## 2020-12-04 ENCOUNTER — Other Ambulatory Visit: Payer: Self-pay

## 2020-12-04 ENCOUNTER — Ambulatory Visit: Payer: Medicare HMO | Attending: Internal Medicine | Admitting: Internal Medicine

## 2020-12-04 VITALS — BP 132/77 | HR 68 | Resp 16 | Wt 235.0 lb

## 2020-12-04 DIAGNOSIS — M5416 Radiculopathy, lumbar region: Secondary | ICD-10-CM | POA: Diagnosis not present

## 2020-12-04 DIAGNOSIS — Z86711 Personal history of pulmonary embolism: Secondary | ICD-10-CM | POA: Diagnosis not present

## 2020-12-04 DIAGNOSIS — R3589 Other polyuria: Secondary | ICD-10-CM | POA: Diagnosis not present

## 2020-12-04 DIAGNOSIS — R350 Frequency of micturition: Secondary | ICD-10-CM | POA: Diagnosis not present

## 2020-12-04 DIAGNOSIS — E66811 Obesity, class 1: Secondary | ICD-10-CM

## 2020-12-04 DIAGNOSIS — R7303 Prediabetes: Secondary | ICD-10-CM | POA: Diagnosis not present

## 2020-12-04 DIAGNOSIS — E669 Obesity, unspecified: Secondary | ICD-10-CM

## 2020-12-04 DIAGNOSIS — Z125 Encounter for screening for malignant neoplasm of prostate: Secondary | ICD-10-CM | POA: Diagnosis not present

## 2020-12-04 LAB — POCT GLYCOSYLATED HEMOGLOBIN (HGB A1C): HbA1c, POC (prediabetic range): 5.9 % (ref 5.7–6.4)

## 2020-12-04 LAB — GLUCOSE, POCT (MANUAL RESULT ENTRY): POC Glucose: 121 mg/dl — AB (ref 70–99)

## 2020-12-04 MED ORDER — TAMSULOSIN HCL 0.4 MG PO CAPS: 0.4000 mg | ORAL_CAPSULE | Freq: Every day | ORAL | 5 refills | Status: DC

## 2020-12-04 NOTE — Progress Notes (Signed)
Patient ID: Michael [name], male    DOB: 12/08/1960  MRN: [id number]  CC: Back Pain   Subjective: Michael [name] is a 60 y.o. male who presents for chronic ds management His concerns today include: Patient with history of HTN, CAD, PAD, PE/DVT on Coumadin followed at cardiology anticoag clinic, HL, CVA by MRI 11/2018, mild carotid artery disease, prediabetes  Chronic LBP: On last visit with me, he complained of left-sided lumbar radiculopathy symptoms.  I had ordered an x-ray of the lumbar spine but he did not have it done.  Subsequently saw our physician assistant earlier this mth. Did x-ray which revealed slight progression of moderate multilevel DDD from L2-S1.  Referred to sports medicine Dr. Prince RomeHilts.  He recommended trial of physical therapy and if no improvement then proceed with MRI.  Patient reports physical therapy will start next week.  PreDM: Patient states that somehow he was mistakenly scheduled to see our clinical pharmacist on the 16th of this month for diabetes management.  However he states that he was never diagnosed with diabetes.  Appointment was canceled.  Review of his chart shows that he has prediabetes and that was last checked in 2019.   Gained 20 lbs since 07/2020.  He attributes this to eating more.  Recently moved in with his mother who does a lot of good cooking and his girlfriend also cooks a lot.  He gets in some exercise by walking to and from the bus stop since he is not driving at this time.  He has [employer] program through his insurance.  He plans to start taking advantage of this.  History DVT/PE:  on Coumadin.  INR is checked through anticoagulation clinic with his cardiologist.  He denies any bruising or bleeding.  Complains of frequent urination at nights x2 years.  He thinks he gets up about every 20 minutes.  He feels he completely empties his bladder each time.  Denies having to strain to initiate his flow.  No stop and go flow.  Denies any  hematuria.  He drinks about half of an 8 ounces bottle of water through the night.  Denies any EtOH use or caffeinated beverages at nights.  Wonders if this is caused by spots that were seen on his kidneys back in 2019.  I looked back at his imaging studies.  Kidney ultrasound back in 2019 showed bilateral simple cysts. Patient Active Problem List   Diagnosis Date Noted   DVT, lower extremity (HCC) 03/14/2019   Pulmonary embolism (HCC) 03/14/2019   History of pulmonary embolism 09/11/2018   Obsessive-compulsive disorder    Muscle cramps    Postoperative pain    Therapeutic opioid induced constipation    CKD (chronic kidney disease), stage II    Acute blood loss anemia    Debility    Ischemia of extremity 01/23/2018   Frequency of urination and polyuria 07/06/2017   Erectile dysfunction due to diseases classified elsewhere 10/27/2016   PAD (peripheral artery disease) (HCC) 08/11/2016   Nocturia 08/11/2016   Chronic pain of both knees 04/01/2016   Palpitations 05/05/2015   Hyperlipidemia 05/05/2015   HNP (herniated nucleus pulposus), cervical 10/14/2014   Chronic low back pain 10/14/2014   Numbness and tingling of both upper extremities while sleeping 10/14/2014   Frequent urination 09/03/2014   Lower abdominal pain 09/03/2014   Cervical stenosis of spine 05/16/2014   Essential hypertension 05/16/2014   Numbness and tingling of right arm 11/28/2013   Dental abscess 04/26/2013  Dysuria 04/26/2013   HTN (hypertension) 04/04/2013   CAD (coronary artery disease) 05/25/2011     Current Outpatient Medications on File Prior to Visit  Medication Sig Dispense Refill   warfarin (COUMADIN) 5 MG tablet TAKE 1 TO 1 & 1/2 (ONE & ONE-HALF) TABLETS BY MOUTH ONCE DAILY AS DIRECTED BY  COUMADIN  CLINIC 40 tablet 0   aspirin EC 81 MG tablet Take 1 tablet (81 mg total) by mouth daily. 90 tablet 3   gabapentin (NEURONTIN) 300 MG capsule Take 1 capsule (300 mg total) by mouth at bedtime. 90 capsule 3    latanoprost (XALATAN) 0.005 % ophthalmic solution      lisinopril (ZESTRIL) 10 MG tablet TAKE 1 TABLET (10 MG TOTAL) DAILY. 30 tablet 0   metoprolol tartrate (LOPRESSOR) 25 MG tablet Take 1 tablet (25 mg total) by mouth 2 (two) times daily. 180 tablet 3   nitroGLYCERIN (NITROSTAT) 0.4 MG SL tablet Place 1 tablet (0.4 mg total) under the tongue every 5 (five) minutes as needed for chest pain. 75 tablet 2   rosuvastatin (CRESTOR) 40 MG tablet Take 1 tablet (40 mg total) by mouth daily. 90 tablet 3   No current facility-administered medications on file prior to visit.    Allergies  Allergen Reactions   Ivp Dye [Iodinated Diagnostic Agents] Swelling    Social History   Socioeconomic History   Marital status: Significant Other    Spouse name: Not on file   Number of children: 0   Years of education: Not on file   Highest education level: Not on file  Occupational History   Not on file  Tobacco Use   Smoking status: Never   Smokeless tobacco: Never  Substance and Sexual Activity   Alcohol use: Yes    Comment: Drinks occassionally   Drug use: No   Sexual activity: Not on file  Other Topics Concern   Not on file  Social History Narrative   Not on file   Social Determinants of Health   Financial Resource Strain: Not on file  Food Insecurity: Not on file  Transportation Needs: Not on file  Physical Activity: Not on file  Stress: Not on file  Social Connections: Not on file  Intimate Partner Violence: Not on file    Family History  Problem Relation Age of Onset   Cancer Maternal Aunt    Hyperlipidemia Other    Diabetes Other    Heart disease Other     Past Surgical History:  Procedure Laterality Date   COLONOSCOPY N/A 08/16/2014   Procedure: COLONOSCOPY;  Surgeon: West Bali, MD;  [facility];  Service: Endoscopy;  Laterality: N/A;  10:15 AM   DOPPLER ECHOCARDIOGRAPHY     Preserved left venticular function   EMBOLECTOMY Right 01/23/2018   Procedure:  RIGHT POPLITEAL-TIBIAL  ARTERY EMBOLECTOMY, EXPLORATION OF RIGHT ANTERIOR TIBIAL ARTERY, RIGHT POPLITEAL ARTERY VEIN ANGIOPLASTY;  Surgeon: Sherren Kerns, MD;  Location: [facility];  Service: Vascular;  Laterality: Right;   ENDARTERECTOMY POPLITEAL Left 01/23/2018   Procedure: ENDARTERECTOMY OF LEFT POPLITEAL ARTERY AND TIBIAL-PERONEAL TRUNK;  Surgeon: Sherren Kerns, MD;  Location: [facility] OR;  Service: Vascular;  Laterality: Left;   FEMORAL-POPLITEAL BYPASS GRAFT Left 01/23/2018   Procedure: BYPASS GRAFT LEFT FEMORAL-POPLITEAL ARTERY WITH PROPATEN VASCULAR GRAFT;  Surgeon: Sherren Kerns, MD;  Location: [facility] OR;  Service: Vascular;  Laterality: Left;   HEMATOMA EVACUATION Left 01/25/2018   Procedure: EVACUATION HEMATOMA LEFT POPLITEAL SPACE;  Surgeon: Sherald Hess  J, MD;  [facility];  Service: Vascular;  Laterality: Left;   LEG SURGERY     left leg - has rod and 4 pins   PATELLAR TENDON REPAIR Right 04/26/2014   Procedure: RIGHT PATELLA TENDON REPAIR;  Surgeon: Sheral Apley, MD;  Location: [facility];  Service: Orthopedics;  Laterality: Right;    ROS: Review of Systems Negative except as stated above  PHYSICAL EXAM: BP 132/77   Pulse 68   Resp 16   Wt 235 lb (106.6 kg)   SpO2 98%   BMI 34.70 kg/m   Wt Readings from Last 3 Encounters:  12/04/20 235 lb (106.6 kg)  11/13/20 234 lb 12.8 oz (106.5 kg)  08/04/20 215 lb 6.4 oz (97.7 kg)    Physical Exam  General appearance - alert, well appearing, obese middle-aged older African-American male and in no distress Mental status - normal mood, behavior, speech, dress, motor activity, and thought processes Neck - supple, no significant adenopathy Chest - clear to auscultation, no wheezes, rales or rhonchi, symmetric air entry Heart - normal rate, regular rhythm, normal S1, S2, no murmurs, rubs, clicks or gallops Extremities - peripheral pulses normal, no pedal edema, no clubbing or cyanosis  Results for orders  placed or performed in visit on 12/04/20  POCT glucose (manual entry)  Result Value Ref Range   POC Glucose 121 (A) 70 - 99 mg/dl  POCT glycosylated hemoglobin (Hb A1C)  Result Value Ref Range   Hemoglobin A1C     HbA1c POC (<> result, manual entry)     HbA1c, POC (prediabetic range) 5.9 5.7 - 6.4 %   HbA1c, POC (controlled diabetic range)      CMP Latest Ref Rng & Units 08/04/2020 12/01/2018 12/01/2018  Glucose 65 - 99 mg/dL 75 025(E) 527(P)  BUN 6 - 24 mg/dL 21 82(U) 20  Creatinine 0.76 - 1.27 mg/dL 2.35 3.61 4.43  Sodium 134 - 144 mmol/L 139 137 138  Potassium 3.5 - 5.2 mmol/L 4.4 3.9 4.0  Chloride 96 - 106 mmol/L 102 103 105  CO2 20 - 29 mmol/L 24 - 26  Calcium 8.7 - 10.2 mg/dL 9.4 - 9.0  Total Protein 6.0 - 8.5 g/dL 6.6 - 6.3(L)  Total Bilirubin 0.0 - 1.2 mg/dL 0.2 - 0.4  Alkaline Phos 44 - 121 IU/L 81 - 49  AST 0 - 40 IU/L 20 - 19  ALT 0 - 44 IU/L 33 - 25   Lipid Panel     Component Value Date/Time   CHOL 137 08/04/2020 1015   TRIG 169 (H) 08/04/2020 1015   HDL 35 (L) 08/04/2020 1015   CHOLHDL 3.9 08/04/2020 1015   CHOLHDL 3.6 08/11/2016 1030   VLDL 19 08/11/2016 1030   LDLCALC 73 08/04/2020 1015   LDLDIRECT 164.1 05/04/2013 1107    CBC    Component Value Date/Time   WBC 5.4 08/04/2020 1015   WBC 5.7 12/01/2018 1227   RBC 5.53 08/04/2020 1015   RBC 5.17 12/01/2018 1227   HGB 14.8 08/04/2020 1015   HCT 45.4 08/04/2020 1015   PLT 216 08/04/2020 1015   MCV 82 08/04/2020 1015   MCH 26.8 08/04/2020 1015   MCH 27.7 12/01/2018 1227   MCHC 32.6 08/04/2020 1015   MCHC 31.8 12/01/2018 1227   RDW 12.3 08/04/2020 1015   LYMPHSABS 1.8 12/01/2018 1227   MONOABS 0.6 12/01/2018 1227   EOSABS 0.1 12/01/2018 1227   BASOSABS 0.0 12/01/2018 1227    ASSESSMENT AND  PLAN: 1. Chronic left-sided lumbar radiculopathy He will keep appointment to start physical therapy next week.  If no improvement, we will need to proceed with an MRI as recommended by the sports  specialist   2. Obesity (BMI 30.0-34.9) 3. Prediabetes -Discussed the importance of healthy eating habits and exercise to help prevent progression to full diabetes.  A1c has improved but still in the range for prediabetes. Dietary counseling given.  Advised to eliminate sugary drinks from the diet, cut back on portion sizes especially of white carbohydrates, eat more white lean meat instead of red meat and incorporate fresh fruits and vegetables into the diet daily.  He will start physical therapy next week and use that to judge his tolerance and what he can safely do to prevent making his back pain worse.  Discussed medical weight management program and patient is interested in referral. - Amb Ref to Medical Weight Management - POCT glucose (manual entry) - POCT glycosylated hemoglobin (Hb A1C)  4. Frequency of urination and polyuria May have mild BPH.  He is agreeable to trying low-dose of Flomax to see if symptoms improve. - PSA - tamsulosin (FLOMAX) 0.4 MG CAPS capsule; Take 1 capsule (0.4 mg total) by mouth at bedtime.  Dispense: 30 capsule; Refill: 5  5. History of pulmonary embolism On warfarin.  Tolerating the medication without bruising or bleeding.  6. Prostate cancer screening Patient agreeable to screening with PSA level. - PSA   Patient was given the opportunity to ask questions.  Patient verbalized understanding of the plan and was able to repeat key elements of the plan.   Orders Placed This Encounter  Procedures   PSA   Amb Ref to Medical Weight Management   POCT glucose (manual entry)   POCT glycosylated hemoglobin (Hb A1C)     Requested Prescriptions   Signed Prescriptions Disp Refills   tamsulosin (FLOMAX) 0.4 MG CAPS capsule 30 capsule 5    Sig: Take 1 capsule (0.4 mg total) by mouth at bedtime.    Return in about 4 months (around 04/05/2021).  Jonah Blue, MD, FACP

## 2020-12-04 NOTE — Patient Instructions (Signed)

## 2020-12-05 LAB — PSA: Prostate Specific Ag, Serum: 0.9 ng/mL (ref 0.0–4.0)

## 2020-12-09 ENCOUNTER — Telehealth: Payer: Self-pay

## 2020-12-09 NOTE — Telephone Encounter (Signed)
Contacted pt to go over lab results pt didn't answer lvm   Sent a CRM to [facility] so they can give pt results when they call back

## 2020-12-10 NOTE — Progress Notes (Signed)
Let patient know that he can stop the tamsulosin if it is making him sick.  Let me know if you would like to be referred to the urologist for further evaluation of the frequent urination.

## 2020-12-11 ENCOUNTER — Telehealth: Payer: Self-pay | Admitting: Internal Medicine

## 2020-12-11 DIAGNOSIS — R350 Frequency of micturition: Secondary | ICD-10-CM

## 2020-12-11 NOTE — Telephone Encounter (Signed)
-----   Message from Particia Lather, [place] sent at 12/11/2020  2:00 PM EDT ----- Regarding: referral Pt would like referral to urologist

## 2020-12-18 ENCOUNTER — Ambulatory Visit: Payer: Medicare HMO | Admitting: Physical Therapy

## 2020-12-23 ENCOUNTER — Other Ambulatory Visit: Payer: Self-pay

## 2020-12-23 ENCOUNTER — Ambulatory Visit (INDEPENDENT_AMBULATORY_CARE_PROVIDER_SITE_OTHER): Payer: Medicare HMO | Admitting: *Deleted

## 2020-12-23 DIAGNOSIS — Z5181 Encounter for therapeutic drug level monitoring: Secondary | ICD-10-CM | POA: Diagnosis not present

## 2020-12-23 DIAGNOSIS — I824Y1 Acute embolism and thrombosis of unspecified deep veins of right proximal lower extremity: Secondary | ICD-10-CM

## 2020-12-23 DIAGNOSIS — I2699 Other pulmonary embolism without acute cor pulmonale: Secondary | ICD-10-CM

## 2020-12-23 LAB — POCT INR: INR: 1.9 — AB (ref 2.0–3.0)

## 2020-12-23 NOTE — Patient Instructions (Signed)
Description   Take 1.5 tablets today then continue taking Warfarin 1 tablet daily except for 1.5 tablets on Monday, Wednesday and Fridays. Recheck INR in 4 weeks. [facility] [phone].

## 2020-12-29 ENCOUNTER — Ambulatory Visit (INDEPENDENT_AMBULATORY_CARE_PROVIDER_SITE_OTHER): Payer: Medicare HMO | Admitting: Physical Therapy

## 2020-12-29 ENCOUNTER — Encounter: Payer: Self-pay | Admitting: Physical Therapy

## 2020-12-29 ENCOUNTER — Other Ambulatory Visit: Payer: Self-pay

## 2020-12-29 DIAGNOSIS — R262 Difficulty in walking, not elsewhere classified: Secondary | ICD-10-CM | POA: Diagnosis not present

## 2020-12-29 DIAGNOSIS — G8929 Other chronic pain: Secondary | ICD-10-CM | POA: Diagnosis not present

## 2020-12-29 DIAGNOSIS — M5442 Lumbago with sciatica, left side: Secondary | ICD-10-CM

## 2020-12-29 DIAGNOSIS — M6281 Muscle weakness (generalized): Secondary | ICD-10-CM | POA: Diagnosis not present
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
## 2020-12-29 NOTE — Patient Instructions (Signed)
Access Code: [email or web address] URL: [email or web address] Date: 12/29/2020 Prepared by: Narda Amber  Exercises Supine Bridge - 2 x daily - 7 x weekly - 2 sets - 10 reps - 5 seconds hold Hooklying Single Knee to Chest Stretch - 2 x daily - 7 x weekly - 10 reps - 20 seconds hold Supine Lower Trunk Rotation - 2 x daily - 7 x weekly - 10 reps - 10 seconds hold Standing Thoracic Extension at Wall - 2 x daily - 7 x weekly - 10 reps - 10 seconds hold

## 2021-01-05 ENCOUNTER — Other Ambulatory Visit: Payer: Self-pay | Admitting: Physician Assistant

## 2021-01-09 ENCOUNTER — Encounter: Payer: Medicare HMO | Admitting: Rehabilitative and Restorative Service Providers"

## 2021-01-09 ENCOUNTER — Telehealth: Payer: Self-pay | Admitting: Rehabilitative and Restorative Service Providers"

## 2021-01-09 NOTE — Telephone Encounter (Signed)
PT phoned and spoke with pt. He said he has not been feeling well for the last 3 days and has contacted the MD. PT reminded him of his next appt and that if he is not feeling well to call and cancel. Pt agreed.

## 2021-01-16 ENCOUNTER — Encounter: Payer: Self-pay | Admitting: Physical Therapy

## 2021-01-16 ENCOUNTER — Ambulatory Visit (INDEPENDENT_AMBULATORY_CARE_PROVIDER_SITE_OTHER): Payer: Medicare HMO | Admitting: Physical Therapy

## 2021-01-16 ENCOUNTER — Other Ambulatory Visit: Payer: Self-pay

## 2021-01-16 DIAGNOSIS — M5442 Lumbago with sciatica, left side: Secondary | ICD-10-CM

## 2021-01-16 DIAGNOSIS — G8929 Other chronic pain: Secondary | ICD-10-CM

## 2021-01-16 DIAGNOSIS — M6281 Muscle weakness (generalized): Secondary | ICD-10-CM

## 2021-01-16 DIAGNOSIS — R262 Difficulty in walking, not elsewhere classified: Secondary | ICD-10-CM
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
## 2021-01-19 ENCOUNTER — Encounter: Payer: Medicare HMO | Admitting: Physical Therapy

## 2021-01-20 ENCOUNTER — Other Ambulatory Visit: Payer: Self-pay | Admitting: Cardiovascular Disease

## 2021-01-20 NOTE — Telephone Encounter (Signed)
Prescription refill request received for warfarin Lov: Michael [name], 08/04/2020 Next INR check: 8/10 Warfarin tablet strength: 5mg 

## 2021-01-21 ENCOUNTER — Telehealth: Payer: Self-pay | Admitting: Internal Medicine

## 2021-01-21 ENCOUNTER — Ambulatory Visit (INDEPENDENT_AMBULATORY_CARE_PROVIDER_SITE_OTHER): Payer: Medicare HMO

## 2021-01-21 ENCOUNTER — Ambulatory Visit: Payer: Medicare HMO | Admitting: Physical Therapy

## 2021-01-21 ENCOUNTER — Other Ambulatory Visit: Payer: Self-pay

## 2021-01-21 DIAGNOSIS — I824Y1 Acute embolism and thrombosis of unspecified deep veins of right proximal lower extremity: Secondary | ICD-10-CM

## 2021-01-21 DIAGNOSIS — G8929 Other chronic pain: Secondary | ICD-10-CM | POA: Diagnosis not present

## 2021-01-21 DIAGNOSIS — M6281 Muscle weakness (generalized): Secondary | ICD-10-CM | POA: Diagnosis not present

## 2021-01-21 DIAGNOSIS — R262 Difficulty in walking, not elsewhere classified: Secondary | ICD-10-CM

## 2021-01-21 DIAGNOSIS — Z5181 Encounter for therapeutic drug level monitoring: Secondary | ICD-10-CM

## 2021-01-21 DIAGNOSIS — M5442 Lumbago with sciatica, left side: Secondary | ICD-10-CM

## 2021-01-21 DIAGNOSIS — I2699 Other pulmonary embolism without acute cor pulmonale: Secondary | ICD-10-CM | POA: Diagnosis not present

## 2021-01-21 LAB — POCT INR: INR: 1.3 — AB (ref 2.0–3.0)

## 2021-01-21 NOTE — Patient Instructions (Signed)
Description   Take an extra 1/2 tablet today and tomorrow, then resume same dosage of Warfarin 1 tablet daily except for 1.5 tablets on Mondays, Wednesdays and Fridays. Recheck INR in 10 days. Coumadin [facility] [phone].

## 2021-01-21 NOTE — Telephone Encounter (Signed)
Copied from CRM [id number]. Topic: General - Inquiry >> Jan 19, 2021 12:14 PM Daphine Deutscher D wrote: Reason for CRM: Pt called saying he dropped off some papers to be completed from Dr. Laural Benes for transportation.  He wants to know about the status of that paperwork. CB#  [phone]

## 2021-01-21 NOTE — Telephone Encounter (Signed)
Call placed to patient and completed [employer] application.    He said that he used up all of his rides through [facility] and will need transportation to future medical appointments. He is also registered for [employer].  This CM explained to him that [employer] provides rides to other destinations, not just medical appointments. This includes stores, restaurants and church.
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
## 2021-01-22 NOTE — Telephone Encounter (Signed)
Completed SCAT application faxed to [employer] [place] [employer].  

## 2021-01-23 ENCOUNTER — Telehealth: Payer: Self-pay | Admitting: Internal Medicine

## 2021-01-23 DIAGNOSIS — N401 Enlarged prostate with lower urinary tract symptoms: Secondary | ICD-10-CM | POA: Diagnosis not present

## 2021-01-23 DIAGNOSIS — R3915 Urgency of urination: Secondary | ICD-10-CM | POA: Diagnosis not present

## 2021-01-23 DIAGNOSIS — R350 Frequency of micturition: Secondary | ICD-10-CM | POA: Diagnosis not present

## 2021-01-23 DIAGNOSIS — N5201 Erectile dysfunction due to arterial insufficiency: Secondary | ICD-10-CM | POA: Diagnosis not present

## 2021-01-23 DIAGNOSIS — R3912 Poor urinary stream: Secondary | ICD-10-CM | POA: Diagnosis not present

## 2021-01-23 NOTE — Telephone Encounter (Signed)
   Michael [name] DOB: 01-27-1961 MRN: [id number]   RIDER WAIVER AND RELEASE OF LIABILITY  For purposes of improving physical access to our facilities, [facility] is pleased to partner with third parties to provide [place] patients or other authorized individuals the option of convenient, on-demand ground transportation services (the Chiropractor") through use of the technology service that enables users to request on-demand ground transportation from independent third-party providers.  By opting to use and accept these [employer], I, the undersigned, hereby agree on behalf of myself, and on behalf of any minor child using the Science writer for whom I am the parent or legal guardian, as follows:  Science writer provided to me are provided by independent third-party transportation providers who are not [employer] or employees and who are unaffiliated with [employer]. [facility] is neither a transportation carrier nor a common or public carrier. [facility] has no control over the quality or safety of the transportation that occurs as a result of the [employer]. [facility] cannot guarantee that any third-party transportation provider will complete any arranged transportation service. [facility] makes no representation, warranty, or guarantee regarding the reliability, timeliness, quality, safety, suitability, or availability of any of the Transport Services or that they will be error free. I fully understand that traveling by vehicle involves risks and dangers of serious bodily injury, including permanent disability, paralysis, and death. I agree, on behalf of myself and on behalf of any minor child using the Transport Services for whom I am the parent or legal guardian, that the entire risk arising out of my use of the [employer] remains solely with me, to the maximum extent permitted under applicable law. The [employer] are provided  "as is" and "as available." [facility] disclaims all representations and warranties, express, implied or statutory, not expressly set out in these terms, including the implied warranties of merchantability and fitness for a particular purpose. I hereby waive and release [facility], its agents, employees, officers, directors, representatives, insurers, attorneys, assigns, successors, subsidiaries, and affiliates from any and all past, present, or future claims, demands, liabilities, actions, causes of action, or suits of any kind directly or indirectly arising from acceptance and use of the [employer]. I further waive and release [facility] and its affiliates from all present and future liability and responsibility for any injury or death to persons or damages to property caused by or related to the use of the [employer]. I have read this Waiver and Release of Liability, and I understand the terms used in it and their legal significance. This Waiver is freely and voluntarily given with the understanding that my right (as well as the right of any minor child for whom I am the parent or legal guardian using the [employer]) to legal recourse against  in connection with the [employer] is knowingly surrendered in return for use of these services.   I attest that I read the consent document to [name], gave Mr. Schuld the opportunity to ask questions and answered the questions asked (if any). I affirm that [name] then provided consent for he's participation in this program.     Launa Grill

## 2021-01-26 ENCOUNTER — Encounter: Payer: Medicare HMO | Admitting: Physical Therapy

## 2021-01-28 ENCOUNTER — Other Ambulatory Visit: Payer: Self-pay

## 2021-01-28 ENCOUNTER — Ambulatory Visit (INDEPENDENT_AMBULATORY_CARE_PROVIDER_SITE_OTHER): Payer: Medicare HMO | Admitting: Physical Therapy

## 2021-01-28 DIAGNOSIS — M5442 Lumbago with sciatica, left side: Secondary | ICD-10-CM

## 2021-01-28 DIAGNOSIS — M6281 Muscle weakness (generalized): Secondary | ICD-10-CM

## 2021-01-28 DIAGNOSIS — R262 Difficulty in walking, not elsewhere classified: Secondary | ICD-10-CM

## 2021-01-28 DIAGNOSIS — G8929 Other chronic pain: Secondary | ICD-10-CM
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
## 2021-01-30 ENCOUNTER — Ambulatory Visit (INDEPENDENT_AMBULATORY_CARE_PROVIDER_SITE_OTHER): Payer: Medicare HMO | Admitting: *Deleted

## 2021-01-30 ENCOUNTER — Other Ambulatory Visit: Payer: Self-pay

## 2021-01-30 DIAGNOSIS — Z5181 Encounter for therapeutic drug level monitoring: Secondary | ICD-10-CM

## 2021-01-30 DIAGNOSIS — I2699 Other pulmonary embolism without acute cor pulmonale: Secondary | ICD-10-CM | POA: Diagnosis not present

## 2021-01-30 DIAGNOSIS — I824Y1 Acute embolism and thrombosis of unspecified deep veins of right proximal lower extremity: Secondary | ICD-10-CM

## 2021-01-30 LAB — POCT INR: INR: 2.2 (ref 2.0–3.0)

## 2021-01-30 NOTE — Patient Instructions (Signed)
Description   Continue taking Warfarin 1 tablet daily except for 1.5 tablets on Mondays, Wednesdays and Fridays. Recheck INR in 3 weeks. [facility] [phone].

## 2021-02-02 ENCOUNTER — Telehealth: Payer: Self-pay | Admitting: Physical Therapy

## 2021-02-02 ENCOUNTER — Encounter: Payer: Medicare HMO | Admitting: Physical Therapy

## 2021-02-02 NOTE — Telephone Encounter (Signed)
Erskine Squibb would you like for me to refax

## 2021-02-02 NOTE — Telephone Encounter (Signed)
I called pt to follow up with why he missed his PT appointment today and pt stated he called and left message on Friday that he needed to cancel his appointment due to conflicting dentist appointment. Pt was aware of his upcoming appointments this Wednesday 02/04/2021 and for next Monday 02/09/2021.   Narda Amber, PT, MPT 02/02/21 2:03 PM

## 2021-02-02 NOTE — Telephone Encounter (Signed)
Application re-faxed to [facility] and call placed to patient and informed him of same.

## 2021-02-02 NOTE — Telephone Encounter (Signed)
Pt states [facility] did not receive fax, he does not have # but states is on the form, TE shows faxed 8/12 Erskine Squibb) pls refax

## 2021-02-04 ENCOUNTER — Encounter: Payer: Self-pay | Admitting: Physical Therapy

## 2021-02-04 ENCOUNTER — Other Ambulatory Visit: Payer: Self-pay

## 2021-02-04 ENCOUNTER — Ambulatory Visit (INDEPENDENT_AMBULATORY_CARE_PROVIDER_SITE_OTHER): Payer: Medicare HMO | Admitting: Physical Therapy

## 2021-02-04 DIAGNOSIS — M5442 Lumbago with sciatica, left side: Secondary | ICD-10-CM | POA: Diagnosis not present

## 2021-02-04 DIAGNOSIS — M6281 Muscle weakness (generalized): Secondary | ICD-10-CM

## 2021-02-04 DIAGNOSIS — G8929 Other chronic pain: Secondary | ICD-10-CM | POA: Diagnosis not present

## 2021-02-04 DIAGNOSIS — R262 Difficulty in walking, not elsewhere classified: Secondary | ICD-10-CM | POA: Diagnosis not present
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
## 2021-02-09 ENCOUNTER — Ambulatory Visit (INDEPENDENT_AMBULATORY_CARE_PROVIDER_SITE_OTHER): Payer: Medicare HMO | Admitting: Physical Therapy

## 2021-02-09 ENCOUNTER — Encounter: Payer: Self-pay | Admitting: Physical Therapy

## 2021-02-09 ENCOUNTER — Encounter: Payer: Medicare HMO | Admitting: Physical Therapy

## 2021-02-09 ENCOUNTER — Other Ambulatory Visit: Payer: Self-pay

## 2021-02-09 DIAGNOSIS — M6281 Muscle weakness (generalized): Secondary | ICD-10-CM | POA: Diagnosis not present

## 2021-02-09 DIAGNOSIS — G8929 Other chronic pain: Secondary | ICD-10-CM | POA: Diagnosis not present

## 2021-02-09 DIAGNOSIS — R262 Difficulty in walking, not elsewhere classified: Secondary | ICD-10-CM | POA: Diagnosis not present

## 2021-02-09 DIAGNOSIS — M5442 Lumbago with sciatica, left side: Secondary | ICD-10-CM | POA: Diagnosis not present
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
## 2021-02-11 ENCOUNTER — Other Ambulatory Visit: Payer: Self-pay

## 2021-02-11 ENCOUNTER — Ambulatory Visit (INDEPENDENT_AMBULATORY_CARE_PROVIDER_SITE_OTHER): Payer: Medicare HMO | Admitting: Physical Therapy

## 2021-02-11 ENCOUNTER — Encounter: Payer: Self-pay | Admitting: Physical Therapy

## 2021-02-11 DIAGNOSIS — R262 Difficulty in walking, not elsewhere classified: Secondary | ICD-10-CM

## 2021-02-11 DIAGNOSIS — M6281 Muscle weakness (generalized): Secondary | ICD-10-CM

## 2021-02-11 DIAGNOSIS — G8929 Other chronic pain: Secondary | ICD-10-CM

## 2021-02-11 DIAGNOSIS — M5442 Lumbago with sciatica, left side: Secondary | ICD-10-CM

## 2021-02-11 NOTE — Addendum Note (Signed)
Addended by: Sharmon Leyden on: 02/11/2021 10:57 AM   Modules accepted: Orders
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
## 2021-02-17 ENCOUNTER — Encounter: Payer: Medicare HMO | Admitting: Physical Therapy

## 2021-02-19 ENCOUNTER — Encounter: Payer: Medicare HMO | Admitting: Rehabilitative and Restorative Service Providers"

## 2021-02-23 ENCOUNTER — Encounter: Payer: Medicare HMO | Admitting: Physical Therapy

## 2021-02-24 ENCOUNTER — Telehealth: Payer: Self-pay | Admitting: *Deleted

## 2021-02-24 NOTE — Telephone Encounter (Signed)
Pt is overdue for INR check; called pt & left a message for the pt to call back to reschedule his missed appt.

## 2021-02-25 ENCOUNTER — Encounter: Payer: Medicare HMO | Admitting: Physical Therapy

## 2021-02-25 ENCOUNTER — Telehealth: Payer: Self-pay | Admitting: Physical Therapy

## 2021-02-25 NOTE — Telephone Encounter (Signed)
I attempted to call pt after he didn't show for his 8:00 am Physical Therapy appointment, but the number listed [phone] had someone else's name on the voice mail.   Narda Amber, PT, MPT 02/25/21 8:29 AM

## 2021-02-27 ENCOUNTER — Other Ambulatory Visit: Payer: Self-pay | Admitting: Cardiovascular Disease

## 2021-02-27 ENCOUNTER — Other Ambulatory Visit: Payer: Self-pay | Admitting: Physician Assistant

## 2021-02-27 NOTE — Telephone Encounter (Signed)
Prescription refill request received for warfarin [place]: 11/25/20 [facility])  Next INR check: 03/04/21 Warfarin tablet strength: 5mg   Appropriate dose and refill sent to requested pharmacy.

## 2021-02-27 NOTE — Telephone Encounter (Signed)
*  STAT* If patient is at the pharmacy, call can be transferred to refill team.   1. Which medications need to be refilled? (please list name of each medication and dose if known) Warfarin   2. Which pharmacy/location (including street and city if local pharmacy) is medication to be sent to? [facility]  3. Do they need a 30 day or 90 day supply? Would like a 90 day patient is out

## 2021-03-02 ENCOUNTER — Encounter: Payer: Medicare HMO | Admitting: Physical Therapy

## 2021-03-04 ENCOUNTER — Other Ambulatory Visit: Payer: Self-pay

## 2021-03-04 ENCOUNTER — Ambulatory Visit (INDEPENDENT_AMBULATORY_CARE_PROVIDER_SITE_OTHER): Payer: Medicare HMO | Admitting: *Deleted

## 2021-03-04 DIAGNOSIS — I824Y1 Acute embolism and thrombosis of unspecified deep veins of right proximal lower extremity: Secondary | ICD-10-CM | POA: Diagnosis not present

## 2021-03-04 DIAGNOSIS — Z5181 Encounter for therapeutic drug level monitoring: Secondary | ICD-10-CM

## 2021-03-04 DIAGNOSIS — I2699 Other pulmonary embolism without acute cor pulmonale: Secondary | ICD-10-CM

## 2021-03-04 LAB — POCT INR: INR: 2.2 (ref 2.0–3.0)

## 2021-03-04 NOTE — Patient Instructions (Signed)
Description   Continue taking Warfarin 1 tablet daily except for 1.5 tablets on Mondays, Wednesdays and Fridays. Recheck INR in 4 weeks. Coumadin Clinic [phone].

## 2021-03-09 ENCOUNTER — Other Ambulatory Visit: Payer: Self-pay | Admitting: Physician Assistant

## 2021-03-23 ENCOUNTER — Encounter: Payer: Medicare HMO | Admitting: Physical Therapy

## 2021-04-02 ENCOUNTER — Ambulatory Visit (INDEPENDENT_AMBULATORY_CARE_PROVIDER_SITE_OTHER): Payer: Medicare HMO | Admitting: *Deleted

## 2021-04-02 ENCOUNTER — Other Ambulatory Visit: Payer: Self-pay

## 2021-04-02 DIAGNOSIS — I2699 Other pulmonary embolism without acute cor pulmonale: Secondary | ICD-10-CM | POA: Diagnosis not present

## 2021-04-02 DIAGNOSIS — Z5181 Encounter for therapeutic drug level monitoring: Secondary | ICD-10-CM | POA: Diagnosis not present

## 2021-04-02 DIAGNOSIS — I824Y1 Acute embolism and thrombosis of unspecified deep veins of right proximal lower extremity: Secondary | ICD-10-CM | POA: Diagnosis not present

## 2021-04-02 LAB — POCT INR: INR: 2.4 (ref 2.0–3.0)

## 2021-04-02 NOTE — Patient Instructions (Signed)
Description   Continue taking Warfarin 1 tablet daily except for 1.5 tablets on Mondays, Wednesdays and Fridays. Recheck INR in 6 weeks. Coumadin Clinic [phone].       

## 2021-04-15 DIAGNOSIS — H40013 Open angle with borderline findings, low risk, bilateral: Secondary | ICD-10-CM | POA: Diagnosis not present

## 2021-04-26 ENCOUNTER — Other Ambulatory Visit: Payer: Self-pay | Admitting: Cardiovascular Disease

## 2021-04-29 ENCOUNTER — Other Ambulatory Visit: Payer: Self-pay

## 2021-04-29 ENCOUNTER — Telehealth: Payer: Self-pay | Admitting: Student

## 2021-04-29 ENCOUNTER — Telehealth: Payer: Self-pay

## 2021-04-29 DIAGNOSIS — I739 Peripheral vascular disease, unspecified: Secondary | ICD-10-CM

## 2021-04-29 NOTE — Telephone Encounter (Signed)
I did not need this encounter. °

## 2021-04-29 NOTE — Telephone Encounter (Signed)
Patient calls today to report a knot that has developed right below his left knee. He noticed it about 2 days ago. Doesn't hurt, but "when I push on it, it don't feel regular" Says his feet hurt "numb-like," but that has been the same for over 3 years. Patient is due for ABI and follow up in January. Moved to December.

## 2021-05-05 ENCOUNTER — Encounter: Payer: Self-pay | Admitting: Nurse Practitioner

## 2021-05-05 ENCOUNTER — Other Ambulatory Visit: Payer: Self-pay

## 2021-05-05 ENCOUNTER — Ambulatory Visit: Payer: Medicare HMO | Attending: Nurse Practitioner | Admitting: Nurse Practitioner

## 2021-05-05 DIAGNOSIS — M5416 Radiculopathy, lumbar region: Secondary | ICD-10-CM | POA: Diagnosis not present

## 2021-05-05 MED ORDER — GABAPENTIN 300 MG PO CAPS: 300.0000 mg | ORAL_CAPSULE | Freq: Three times a day (TID) | ORAL | 0 refills | Status: DC

## 2021-05-05 NOTE — Progress Notes (Signed)
Virtual Visit via Telephone Note Due to national recommendations of social distancing due to COVID 19, telehealth visit is felt to be most appropriate for this patient at this time.  I discussed the limitations, risks, security and privacy concerns of performing an evaluation and management service by telephone and the availability of in person appointments. I also discussed with the patient that there may be a patient responsible charge related to this service. The patient expressed understanding and agreed to proceed.    I connected with Michael [name] on 05/05/21  at   9:50 AM EST  EDT by telephone and verified that I am speaking with the correct person using two identifiers.  Location of Patient: Private Residence   Location of Provider: [facility]    Persons participating in Telemedicine visit: Bertram Denver FNP-BC LAMOYNE HESSEL    History of Present Illness: Telemedicine visit for: Back pain  Patient with history of HTN, CAD, PAD, PE/DVT on Coumadin followed at cardiology anticoag clinic, HL, CVA by MRI 11/2018, mild carotid artery disease, prediabetes  Currently taking Gabapenin 600 mg which provides significant relief of his pain and helps him sleep at night. Would like to increase the dosage at this time. He has chronic midline low back pain with left sided sciatica. x-ray which revealed slight progression of moderate multilevel DDD from L2-S1. Therapy in the past has included PT.    Past Medical History:  Diagnosis Date   Alcohol abuse    Anterior myocardial infarction [facility])    ST-elevation; S/P emergent  drug-eluting stenting of proximal left anterior descending   Coronary artery disease    a. STEMI 2012 - s/p DES to LAD with  diffuse distal disease in the right posterolateral branch. The third OM was occluded and filled from left to left collaterals. LVEF preserved.   DVT complicating pregnancy    Dysuria 04/26/2013   Essential  hypertension 05/16/2014   Frequent urination 09/03/2014   HNP (herniated nucleus pulposus), cervical 10/14/2014   Hyperlipidemia    Hypertension    Ischemia of extremity 01/23/2018   Lower abdominal pain 09/03/2014   PAD (peripheral artery disease) (HCC)    Pre-diabetes    Premature atrial contraction    PSVT (paroxysmal supraventricular tachycardia) (HCC)    2 short runs on monitor 06/2019   Pulmonary emboli (HCC)    PVC's (premature ventricular contractions)    Renal insufficiency    Special screening for malignant neoplasms, colon     Past Surgical History:  Procedure Laterality Date   COLONOSCOPY N/A 08/16/2014   Procedure: COLONOSCOPY;  Surgeon: West Bali, MD;  [facility];  Service: Endoscopy;  Laterality: N/A;  10:15 AM   DOPPLER ECHOCARDIOGRAPHY     Preserved left venticular function   EMBOLECTOMY Right 01/23/2018   Procedure: RIGHT POPLITEAL-TIBIAL  ARTERY EMBOLECTOMY, EXPLORATION OF RIGHT ANTERIOR TIBIAL ARTERY, RIGHT POPLITEAL ARTERY VEIN ANGIOPLASTY;  Surgeon: Sherren Kerns, MD;  [facility];  Service: Vascular;  Laterality: Right;   ENDARTERECTOMY POPLITEAL Left 01/23/2018   Procedure: ENDARTERECTOMY OF LEFT POPLITEAL ARTERY AND TIBIAL-PERONEAL TRUNK;  Surgeon: Sherren Kerns, MD;  [facility] OR;  Service: Vascular;  Laterality: Left;   FEMORAL-POPLITEAL BYPASS GRAFT Left 01/23/2018   Procedure: BYPASS GRAFT LEFT FEMORAL-POPLITEAL ARTERY WITH PROPATEN VASCULAR GRAFT;  Surgeon: Sherren Kerns, MD;  Location: [facility];  Service: Vascular;  Laterality: Left;   HEMATOMA EVACUATION Left 01/25/2018   Procedure: EVACUATION HEMATOMA LEFT POPLITEAL SPACE;  Surgeon: Cephus Shelling, MD;  Location: [facility] OR;  Service: Vascular;  Laterality: Left;   LEG SURGERY     left leg - has rod and 4 pins   PATELLAR TENDON REPAIR Right 04/26/2014   Procedure: RIGHT PATELLA TENDON REPAIR;  Surgeon: Sheral Apley, MD;  Location: [facility];  Service:  Orthopedics;  Laterality: Right;    Family History  Problem Relation Age of Onset   Cancer Maternal Aunt    Hyperlipidemia Other    Diabetes Other    Heart disease Other     Social History   Socioeconomic History   Marital status: Significant Other    Spouse name: Not on file   Number of children: 0   Years of education: Not on file   Highest education level: Not on file  Occupational History   Not on file  Tobacco Use   Smoking status: Never   Smokeless tobacco: Never  Substance and Sexual Activity   Alcohol use: Yes    Comment: Drinks occassionally   Drug use: No   Sexual activity: Not on file  Other Topics Concern   Not on file  Social History Narrative   Not on file   Social Determinants of Health   Financial Resource Strain: Not on file  Food Insecurity: Not on file  Transportation Needs: Not on file  Physical Activity: Not on file  Stress: Not on file  Social Connections: Not on file     Observations/Objective: Awake, alert and oriented x 3   ROS  Assessment and Plan: Diagnoses and all orders for this visit:  Chronic left-sided lumbar radiculopathy -     gabapentin (NEURONTIN) 300 MG capsule; Take 1 capsule (300 mg total) by mouth 3 (three) times daily.    Follow Up Instructions Return if symptoms worsen or fail to improve.     I discussed the assessment and treatment plan with the patient. The patient was provided an opportunity to ask questions and all were answered. The patient agreed with the plan and demonstrated an understanding of the instructions.   The patient was advised to call back or seek an in-person evaluation if the symptoms worsen or if the condition fails to improve as anticipated.  I provided 10 minutes of non-face-to-face time during this encounter including median intraservice time, reviewing previous notes, labs, imaging, medications and explaining diagnosis and management.  Claiborne Rigg, FNP-BC

## 2021-05-14 ENCOUNTER — Ambulatory Visit: Payer: Medicare HMO

## 2021-05-14 ENCOUNTER — Encounter (HOSPITAL_COMMUNITY): Payer: Medicare HMO

## 2021-05-15 ENCOUNTER — Ambulatory Visit (INDEPENDENT_AMBULATORY_CARE_PROVIDER_SITE_OTHER): Payer: Medicare HMO

## 2021-05-15 ENCOUNTER — Other Ambulatory Visit: Payer: Self-pay

## 2021-05-15 DIAGNOSIS — I2699 Other pulmonary embolism without acute cor pulmonale: Secondary | ICD-10-CM | POA: Diagnosis not present

## 2021-05-15 DIAGNOSIS — I824Y1 Acute embolism and thrombosis of unspecified deep veins of right proximal lower extremity: Secondary | ICD-10-CM | POA: Diagnosis not present

## 2021-05-15 DIAGNOSIS — Z5181 Encounter for therapeutic drug level monitoring: Secondary | ICD-10-CM

## 2021-05-15 LAB — POCT INR: INR: 2.7 (ref 2.0–3.0)

## 2021-05-15 NOTE — Patient Instructions (Signed)
Description   Continue taking Warfarin 1 tablet daily except for 1.5 tablets on Mondays, Wednesdays and Fridays. Recheck INR in 6 weeks. Coumadin Clinic [phone].

## 2021-05-18 ENCOUNTER — Other Ambulatory Visit: Payer: Self-pay | Admitting: Cardiovascular Disease

## 2021-06-05 ENCOUNTER — Encounter (HOSPITAL_COMMUNITY): Payer: Medicare HMO

## 2021-06-05 ENCOUNTER — Ambulatory Visit: Payer: Medicare HMO

## 2021-06-17 DIAGNOSIS — R6889 Other general symptoms and signs: Secondary | ICD-10-CM | POA: Diagnosis not present

## 2021-06-17 DIAGNOSIS — L819 Disorder of pigmentation, unspecified: Secondary | ICD-10-CM | POA: Diagnosis not present

## 2021-06-26 ENCOUNTER — Ambulatory Visit (INDEPENDENT_AMBULATORY_CARE_PROVIDER_SITE_OTHER): Payer: Medicare HMO

## 2021-06-26 ENCOUNTER — Other Ambulatory Visit: Payer: Self-pay

## 2021-06-26 ENCOUNTER — Ambulatory Visit: Payer: Medicare HMO | Attending: Internal Medicine

## 2021-06-26 DIAGNOSIS — Z23 Encounter for immunization: Secondary | ICD-10-CM

## 2021-06-26 DIAGNOSIS — I2699 Other pulmonary embolism without acute cor pulmonale: Secondary | ICD-10-CM

## 2021-06-26 DIAGNOSIS — Z5181 Encounter for therapeutic drug level monitoring: Secondary | ICD-10-CM

## 2021-06-26 DIAGNOSIS — Z0000 Encounter for general adult medical examination without abnormal findings: Secondary | ICD-10-CM | POA: Diagnosis not present

## 2021-06-26 DIAGNOSIS — I824Y1 Acute embolism and thrombosis of unspecified deep veins of right proximal lower extremity: Secondary | ICD-10-CM

## 2021-06-26 LAB — POCT INR: INR: 2.9 (ref 2.0–3.0)

## 2021-06-26 NOTE — Patient Instructions (Signed)
Continue taking Warfarin 1 tablet daily except for 1.5 tablets on Mondays, Wednesdays and Fridays. Recheck INR in 6 weeks. Coumadin Clinic [phone].  °

## 2021-06-26 NOTE — Progress Notes (Addendum)
Subjective:   Michael [name] is a 61 y.o. male who presents for an Initial Medicare Annual Wellness Visit.  Patient visit virtually in the context of Covid-19 pandemic.  I connected with Acey LavGarry Dolle on 06/26/21 at 1:30pm by telephone and verified that I am speaking with the correct person using two identifiers. I discussed the limitations, risks, security and privacy concerns of performing an evaluation and management service by telephone and the availability of in person appointments. I also discussed with the patient that there may be a patient responsible charge related to this service. The patient expressed understanding and agreed to proceed. Patient location:  Home My Location:  Home Persons on the telephone call: Patient   Review of Systems           Objective:    Today's Vitals   06/26/21 1330  PainSc: 8    There is no height or weight on file to calculate BMI.  Advanced Directives 06/26/2021 12/29/2020 03/09/2018 01/27/2018 01/27/2018 03/23/2017 10/27/2016  Does Patient Have a Medical Advance Directive? No No No No No No No  Type of Advance Directive - - - - - - -  Copy of Healthcare Power of Attorney in Chart? - - - - - - -  Would patient like information on creating a medical advance directive? Yes (Inpatient - patient defers creating a medical advance directive at this time - Information given) No - Patient declined - No - Patient declined No - Patient declined No - Patient declined -    Current Medications (verified) Outpatient Encounter Medications as of 06/26/2021  Medication Sig   aspirin EC 81 MG tablet Take 1 tablet (81 mg total) by mouth daily.   gabapentin (NEURONTIN) 300 MG capsule Take 1 capsule (300 mg total) by mouth 3 (three) times daily.   latanoprost (XALATAN) 0.005 % ophthalmic solution    lisinopril (ZESTRIL) 10 MG tablet TAKE 1 TABLET DAILY. Please make yearly appt with Dr. [name] for February 2023 for future refills. Thank you 1st attempt    metoprolol tartrate (LOPRESSOR) 25 MG tablet Take 1 tablet (25 mg total) by mouth 2 (two) times daily.   nitroGLYCERIN (NITROSTAT) 0.4 MG SL tablet Place 1 tablet (0.4 mg total) under the tongue every 5 (five) minutes as needed for chest pain.   rosuvastatin (CRESTOR) 40 MG tablet Take 1 tablet (40 mg total) by mouth daily.   tamsulosin (FLOMAX) 0.4 MG CAPS capsule Take 1 capsule (0.4 mg total) by mouth at bedtime.   warfarin (COUMADIN) 5 MG tablet TAKE 1 TO 1 & 1/2 (ONE & ONE-HALF) TABLETS BY MOUTH ONCE DAILY AS DIRECTED BY  COUMADIN  CLINIC   No facility-administered encounter medications on file as of 06/26/2021.    Allergies (verified) Ivp dye [iodinated contrast media]   History: Past Medical History:  Diagnosis Date   Alcohol abuse    Anterior myocardial infarction [facility](HCC)    ST-elevation; S/P emergent  drug-eluting stenting of proximal left anterior descending   Coronary artery disease    a. STEMI 2012 - s/p DES to LAD with  diffuse distal disease in the right posterolateral branch. The third OM was occluded and filled from left to left collaterals. LVEF preserved.   DVT complicating pregnancy    Dysuria 04/26/2013   Essential hypertension 05/16/2014   Frequent urination 09/03/2014   HNP (herniated nucleus pulposus), cervical 10/14/2014   Hyperlipidemia    Hypertension    Ischemia of extremity 01/23/2018   Lower abdominal pain 09/03/2014  PAD (peripheral artery disease) (HCC)    Pre-diabetes    Premature atrial contraction    PSVT (paroxysmal supraventricular tachycardia) (HCC)    2 short runs on monitor 06/2019   Pulmonary emboli (HCC)    PVC's (premature ventricular contractions)    Renal insufficiency    Special screening for malignant neoplasms, colon    Past Surgical History:  Procedure Laterality Date   COLONOSCOPY N/A 08/16/2014   Procedure: COLONOSCOPY;  Surgeon: West Bali, MD;  [facility];  Service: Endoscopy;  Laterality: N/A;  10:15 AM   DOPPLER  ECHOCARDIOGRAPHY     Preserved left venticular function   EMBOLECTOMY Right 01/23/2018   Procedure: RIGHT POPLITEAL-TIBIAL  ARTERY EMBOLECTOMY, EXPLORATION OF RIGHT ANTERIOR TIBIAL ARTERY, RIGHT POPLITEAL ARTERY VEIN ANGIOPLASTY;  Surgeon: Sherren Kerns, MD;  Location: [facility] OR;  Service: Vascular;  Laterality: Right;   ENDARTERECTOMY POPLITEAL Left 01/23/2018   Procedure: ENDARTERECTOMY OF LEFT POPLITEAL ARTERY AND TIBIAL-PERONEAL TRUNK;  Surgeon: Sherren Kerns, MD;  Location: [facility] OR;  Service: Vascular;  Laterality: Left;   FEMORAL-POPLITEAL BYPASS GRAFT Left 01/23/2018   Procedure: BYPASS GRAFT LEFT FEMORAL-POPLITEAL ARTERY WITH PROPATEN VASCULAR GRAFT;  Surgeon: Sherren Kerns, MD;  Location: [facility] OR;  Service: Vascular;  Laterality: Left;   HEMATOMA EVACUATION Left 01/25/2018   Procedure: EVACUATION HEMATOMA LEFT POPLITEAL SPACE;  Surgeon: Cephus Shelling, MD;  Location: [facility] OR;  Service: Vascular;  Laterality: Left;   LEG SURGERY     left leg - has rod and 4 pins   PATELLAR TENDON REPAIR Right 04/26/2014   Procedure: RIGHT PATELLA TENDON REPAIR;  Surgeon: Sheral Apley, MD;  Location: [facility];  Service: Orthopedics;  Laterality: Right;   Family History  Problem Relation Age of Onset   Cancer Maternal Aunt    Hyperlipidemia Other    Diabetes Other    Heart disease Other    Social History   Socioeconomic History   Marital status: Significant Other    Spouse name: Not on file   Number of children: 0   Years of education: Not on file   Highest education level: Not on file  Occupational History   Not on file  Tobacco Use   Smoking status: Never   Smokeless tobacco: Never  Substance and Sexual Activity   Alcohol use: Yes    Comment: Drinks occassionally   Drug use: No   Sexual activity: Not on file  Other Topics Concern   Not on file  Social History Narrative   Not on file   Social Determinants of Health   Financial Resource Strain: Not on file   Food Insecurity: Not on file  Transportation Needs: Not on file  Physical Activity: Not on file  Stress: Not on file  Social Connections: Not on file    Tobacco Counseling Counseling given: Not Answered   Clinical Intake:     Pain : 0-10 Pain Score: 8  Pain Location: Back Pain Orientation: Lower     Diabetes: No     Diabetic?No         Activities of Daily Living In your present state of health, do you have any difficulty performing the following activities: 06/26/2021  Hearing? N  Vision? Y  Difficulty concentrating or making decisions? N  Walking or climbing stairs? Y  Dressing or bathing? N  Doing errands, shopping? N  Preparing Food and eating ? N  Using the Toilet? N  In the past six months, have  you accidently leaked urine? N  Do you have problems with loss of bowel control? N  Managing your Medications? N  Managing your Finances? N  Housekeeping or managing your Housekeeping? N  Some recent data might be hidden    Patient Care Team: Marcine MatarJohnson, Deborah B, MD as PCP - General (Internal Medicine) Kathleene HazelMcAlhany, Christopher D, MD as PCP - Cardiology (Cardiology)  Indicate any recent Medical Services you may have received from other than [facility] providers in the past year (date may be approximate).     Assessment:   This is a routine wellness examination for [place].  Hearing/Vision screen No results found.  Dietary issues and exercise activities discussed:     Goals Addressed   None   Depression Screen PHQ 2/9 Scores 06/26/2021 12/04/2020 11/13/2020 07/06/2017 03/23/2017 10/27/2016 08/11/2016  PHQ - 2 Score 0 0 0 2 1 0 2  PHQ- 9 Score - - - 8 - 5 6    Fall Risk Fall Risk  06/26/2021 12/04/2020 11/13/2020 10/27/2016 08/11/2016  Falls in the past year? 1 0 0 No No  Number falls in past yr: 0 0 0 - -  Injury with Fall? 0 0 0 - -  Risk Factor Category  - - - - -  Risk for fall due to : - No Fall Risks No Fall Risks - -  Follow up Education provided - - - -     FALL RISK PREVENTION PERTAINING TO THE HOME:  Any stairs in or around the home? No  If so, are there any without handrails? No  Home free of loose throw rugs in walkways, pet beds, electrical cords, etc? Yes  Adequate lighting in your home to reduce risk of falls? Yes   ASSISTIVE DEVICES UTILIZED TO PREVENT FALLS:  Life alert? No  Use of a cane, walker or w/c? Yes  Pt states he uses a cane Grab bars in the bathroom? Yes  Shower chair or bench in shower? No  Elevated toilet seat or a handicapped toilet? No    TIMED UP AND GO:  Was the test performed? No .  Length of time to ambulate 10 feet: Pt states it can take him at least a min.    Gait slow and steady with assistive device  Pt is requesting a rx for a shower chair. Pt states he unable to stand in the chair for a long period of time. Pt states he does be nervous standing in the shower because his knee may give out. Pt states they have a mat in the shower but he will feel more comfortable with a shower chair because he will have stability.   Cognitive Function: MMSE - Mini Mental State Exam 06/26/2021  Orientation to time 5  Orientation to Place 5  Registration 3  Attention/ Calculation 5  Recall 3  Language- name 2 objects 2  Language- repeat 1  Language- follow 3 step command 3  Language- read & follow direction 1  Write a sentence 1  Copy design 1  Total score 30        Immunizations  There is no immunization history on file for this patient.  TDAP status: Due, Education has been provided regarding the importance of this vaccine. Advised may receive this vaccine at local pharmacy or Health Dept. Aware to provide a copy of the vaccination record if obtained from local pharmacy or Health Dept. Verbalized acceptance and understanding.  Flu Vaccine status: Up to date. Pt received vaccine from pharmacy  Pneumococcal vaccine status: Due, Education has been provided regarding the importance of this vaccine.  Advised may receive this vaccine at local pharmacy or Health Dept. Aware to provide a copy of the vaccination record if obtained from local pharmacy or Health Dept. Verbalized acceptance and understanding.  Covid-19 vaccine status: Completed vaccines. Made pt aware to bring COVID vaccine card with him to next appointment to update chart.   Qualifies for Shingles Vaccine? Yes   Zostavax completed No   Shingrix Completed?: No.    Education has been provided regarding the importance of this vaccine. Patient has been advised to call insurance company to determine out of pocket expense if they have not yet received this vaccine. Advised may also receive vaccine at local pharmacy or Health Dept. Verbalized acceptance and understanding.  Screening Tests Health Maintenance  Topic Date Due   COVID-19 Vaccine (1) Never done   Pneumococcal Vaccine 68-32 Years old (1 - PCV) Never done   FOOT EXAM  Never done   OPHTHALMOLOGY EXAM  Never done   TETANUS/TDAP  Never done   Zoster Vaccines- Shingrix (1 of 2) Never done   INFLUENZA VACCINE  Never done   HEMOGLOBIN A1C  06/05/2021   COLONOSCOPY (Pts 45-11yrs Insurance coverage will need to be confirmed)  08/15/2024   Hepatitis C Screening  Completed   HIV Screening  Completed   HPV VACCINES  Aged Out    Health Maintenance  Health Maintenance Due  Topic Date Due   COVID-19 Vaccine (1) Never done   Pneumococcal Vaccine 88-33 Years old (1 - PCV) Never done   FOOT EXAM  Never done   OPHTHALMOLOGY EXAM  Never done   TETANUS/TDAP  Never done   Zoster Vaccines- Shingrix (1 of 2) Never done   INFLUENZA VACCINE  Never done   HEMOGLOBIN A1C  06/05/2021    Colorectal cancer screening: Type of screening: Colonoscopy. Completed 08/26/2014. Repeat every 10 years  Lung Cancer Screening: (Low Dose CT Chest recommended if Age 73-80 years, 30 pack-year currently smoking OR have quit w/in 15years.) does not qualify.     Additional Screening:  Hepatitis C  Screening: does qualify; Completed 03/23/2017  Vision Screening: Recommended annual ophthalmology exams for early detection of glaucoma and other disorders of the eye. Is the patient up to date with their annual eye exam?  Yes  Who is the provider or what is the name of the office in which the patient attends annual eye exams? [facility]  If pt is not established with a provider, would they like to be referred to a provider to establish care? No .   Dental Screening: Recommended annual dental exams for proper oral hygiene  Community Resource Referral / Chronic Care Management: CRR required this visit?  No   CCM required this visit?  No      Plan:     I have personally reviewed and noted the following in the patients chart:   Medical and social history Use of alcohol, tobacco or illicit drugs  Current medications and supplements including opioid prescriptions. Patient is not currently taking opioid prescriptions. Functional ability and status Nutritional status Physical activity Advanced directives List of other physicians Hospitalizations, surgeries, and ER visits in previous 12 months Vitals Screenings to include cognitive, depression, and falls Referrals and appointments  In addition, I have reviewed and discussed with patient certain preventive protocols, quality metrics, and best practice recommendations. A written personalized care plan for preventive services as well as general preventive  health recommendations were provided to patient.     Particia Lather, RMA   06/26/2021   Nurse Notes: I  Spent 60 minutes on this telephone encounter

## 2021-06-29 ENCOUNTER — Ambulatory Visit (HOSPITAL_COMMUNITY)
Admission: RE | Admit: 2021-06-29 | Discharge: 2021-06-29 | Disposition: A | Discharge status: Home / Self Care | Payer: Medicare HMO | Source: Ambulatory Visit | Attending: Surgery | Admitting: Surgery

## 2021-06-29 ENCOUNTER — Ambulatory Visit (INDEPENDENT_AMBULATORY_CARE_PROVIDER_SITE_OTHER): Payer: Medicare HMO | Admitting: Physician Assistant

## 2021-06-29 ENCOUNTER — Other Ambulatory Visit: Payer: Self-pay

## 2021-06-29 VITALS — BP 149/90 | HR 55 | Temp 98.0°F | Resp 20 | Ht 69.0 in | Wt 242.6 lb

## 2021-06-29 DIAGNOSIS — I739 Peripheral vascular disease, unspecified: Secondary | ICD-10-CM | POA: Diagnosis not present

## 2021-06-29 DIAGNOSIS — I4891 Unspecified atrial fibrillation: Secondary | ICD-10-CM | POA: Diagnosis not present
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
## 2021-06-30 ENCOUNTER — Other Ambulatory Visit: Payer: Self-pay | Admitting: Cardiology

## 2021-06-30 ENCOUNTER — Other Ambulatory Visit: Payer: Self-pay | Admitting: Internal Medicine

## 2021-06-30 DIAGNOSIS — M545 Low back pain, unspecified: Secondary | ICD-10-CM

## 2021-06-30 DIAGNOSIS — I824Y1 Acute embolism and thrombosis of unspecified deep veins of right proximal lower extremity: Secondary | ICD-10-CM

## 2021-06-30 NOTE — Progress Notes (Signed)
Rx generated for shower chair.

## 2021-07-10 ENCOUNTER — Other Ambulatory Visit: Payer: Self-pay

## 2021-07-10 ENCOUNTER — Ambulatory Visit: Payer: Medicare HMO | Attending: Internal Medicine | Admitting: Internal Medicine

## 2021-07-10 DIAGNOSIS — N401 Enlarged prostate with lower urinary tract symptoms: Secondary | ICD-10-CM

## 2021-07-10 DIAGNOSIS — E669 Obesity, unspecified: Secondary | ICD-10-CM | POA: Diagnosis not present

## 2021-07-10 DIAGNOSIS — I10 Essential (primary) hypertension: Secondary | ICD-10-CM | POA: Diagnosis not present

## 2021-07-10 DIAGNOSIS — R350 Frequency of micturition: Secondary | ICD-10-CM | POA: Diagnosis not present

## 2021-07-10 DIAGNOSIS — G8929 Other chronic pain: Secondary | ICD-10-CM

## 2021-07-10 DIAGNOSIS — M5416 Radiculopathy, lumbar region: Secondary | ICD-10-CM | POA: Diagnosis not present

## 2021-07-10 DIAGNOSIS — M545 Low back pain, unspecified: Secondary | ICD-10-CM | POA: Diagnosis not present

## 2021-07-10 MED ORDER — GABAPENTIN 300 MG PO CAPS: ORAL_CAPSULE | ORAL | 4 refills | Status: DC

## 2021-07-10 MED ORDER — LISINOPRIL 20 MG PO TABS: ORAL_TABLET | ORAL | 5 refills | Status: DC

## 2021-07-10 NOTE — Progress Notes (Signed)
Patient ID: Michael [name], male   DOB: [birth year], 61 y.o.   MRN: [id number] Virtual Visit via Telephone Note  I connected with [name] on 07/10/2021 at 9:40 a.m by telephone and verified that I am speaking with the correct person using two identifiers  Location: Patient: home Provider: office  Participants: Myself Patient   I discussed the limitations, risks, security and privacy concerns of performing an evaluation and management service by telephone and the availability of in person appointments. I also discussed with the patient that there may be a patient responsible charge related to this service. The patient expressed understanding and agreed to proceed.   History of Present Illness: Patient with history of HTN, CAD, PAD, PE/DVT on Coumadin followed at cardiology anticoag clinic, HL, CVA by MRI 11/2018, mild carotid artery disease, prediabetes.  Last saw me 11/2020. Today's visit is for chronic ds management.   DDD lumbar spine: Patient seen by NP 04/2021 requesting increased dose of gabapentin to help with his chronic back issues.  It was increased from 300 mg at bedtime to 600 mg at bedtime.  Currently taking Tylenol 500 mg3 tabs BID and at nights he takes 600 mg at bed time. Gabapentin really helps with back pain.  Did some P.T for his back for a few mths since last visit and found this helpful also.  Still having frequent urination at nights.  Gets up about 12 times at nights.  Reported same on last visit.  Started on Flomax for possible BPH. Did okay but one day he took it during the day instead of at nights and it made him dizzy.  So he has not taken it since then. PSA level was nl.  Saw Dr. Alvester Morin around 03/2021 and dose of Flomax  was increased when pt was not really taking the 0.4 mg consistently.  The 0.8 made him too dizziness.  Reports being told he has mild BPH.  Has f/u appt in 6 mths.  He started taking an OTC Cold and Flu Day time Relief med at nights.  Just  started taking this wk, and took about 3 times so far.  He finds it really helps him get a good nights sleep.  Has no problems falling asleep; problems staying asleep because of frequent urination. Up about every 30 mins.    PreDM/Obesity:  wgh was up 20 lbs on last visit He has been walking 3 x/wk and doing some wg lifting.  Current wgh is 239 lbs.  He June he was 235 lbs.  Doing better with eating habits.  Not eating fried foods except fried fish on Fridays.  Portions are smaller. Referred to [facility] on last visit.  He was not called as yet  HTN:  taking Metoprolol and Lisinopril Checks BP 2x/wk.  Reports range 140-150/70-80s Limiting in foods.  HM:  Reports having had shingles, Flu and Pneumonia vaccines at [facility]. Told he was not due for Tdapt as yet  Outpatient Encounter Medications as of 07/10/2021  Medication Sig Note   aspirin EC 81 MG tablet Take 1 tablet (81 mg total) by mouth daily.    gabapentin (NEURONTIN) 300 MG capsule Take 1 capsule (300 mg total) by mouth 3 (three) times daily. 05/05/2021: Taking 600 mg at night   latanoprost (XALATAN) 0.005 % ophthalmic solution     lisinopril (ZESTRIL) 10 MG tablet TAKE 1 TABLET DAILY. Please make yearly appt with Dr. Clifton James for February 2023 for future refills. Thank you 1st attempt  metoprolol tartrate (LOPRESSOR) 25 MG tablet Take 1 tablet (25 mg total) by mouth 2 (two) times daily.    nitroGLYCERIN (NITROSTAT) 0.4 MG SL tablet Place 1 tablet (0.4 mg total) under the tongue every 5 (five) minutes as needed for chest pain.    rosuvastatin (CRESTOR) 40 MG tablet Take 1 tablet (40 mg total) by mouth daily.    silodosin (RAPAFLO) 8 MG CAPS capsule Take 8 mg by mouth at bedtime.    tamsulosin (FLOMAX) 0.4 MG CAPS capsule Take 1 capsule (0.4 mg total) by mouth at bedtime.    warfarin (COUMADIN) 5 MG tablet TAKE 1 TO 1 & 1/2 TABLETS BY MOUTH ONCE DAILY AS DIRECTED BY COUMADIN CLINIC    No facility-administered encounter medications on file as  of 07/10/2021.      Observations/Objective: No direct observation done as this was a telephone visit. Results for orders placed or performed in visit on 06/26/21  POCT INR  Result Value Ref Range   INR 2.9 2.0 - 3.0     Chemistry      Component Value Date/Time   NA 139 08/04/2020 1015   K 4.4 08/04/2020 1015   CL 102 08/04/2020 1015   CO2 24 08/04/2020 1015   BUN 21 08/04/2020 1015   CREATININE 1.23 08/04/2020 1015   CREATININE 1.16 08/11/2016 1030      Component Value Date/Time   CALCIUM 9.4 08/04/2020 1015   ALKPHOS 81 08/04/2020 1015   AST 20 08/04/2020 1015   ALT 33 08/04/2020 1015   BILITOT 0.2 08/04/2020 1015     Lab Results  Component Value Date   WBC 5.4 08/04/2020   HGB 14.8 08/04/2020   HCT 45.4 08/04/2020   MCV 82 08/04/2020   PLT 216 08/04/2020     Assessment and Plan: 1. Chronic low back pain, unspecified back pain laterality, unspecified whether sciatica present Advised patient to cut back on the Tylenol.  He is currently taking 1500 mg twice a day with gabapentin 600 mg at nights. -I recommend taking Tylenol 500 mg 3 times a day. Increase gabapentin to 300 mg in the morning and 600 mg in the evening. - gabapentin (NEURONTIN) 300 MG capsule; 1 cap PO Q a.m and 2 caps Q p.m  Dispense: 90 capsule; Refill: 4  2. Benign prostatic hyperplasia with urinary frequency I told patient it does not seem he gave a good try of the Flomax.  I think he would sleep better if we are able to get his polyuria under better control.  I recommend trying the Flomax again 0.4 mg 1 tablet at bedtime.  Advised to go slow if he wakes up during the night to have to use the restroom.  He should sit on the side of the bed for several seconds before standing up.  This will help prevent hypotension which can occur with the Fosamax.  If he continues to feel dizziness with the medication spite these measures, then he should stop it and let Dr. Alvester MorinBell know.  3. Obesity (BMI  30.0-34.9) Commended him on dietary changes that he has made so far.  Encouraged him to keep up the good work.  We are still awaiting an appointment for him with medical weight management they are back to a log by several months.  Encouraged regular exercise.  [facility] [employer] [facility]. - Hemoglobin A1c; Future - CBC; Future  4. Essential hypertension Not at goal.  Increase lisinopril to 20 mg daily.  Continue to monitor blood pressure with goal being 130/80 or lower. - Comprehensive metabolic panel; Future - Lipid panel; Future - lisinopril (ZESTRIL) 20 MG tablet; TAKE 1 TABLET DAILY. Please make yearly appt with Dr. Clifton James for February 2023 for future refills. Thank you 1st attempt  Dispense: 30 tablet; Refill: 5  I will have one of our clinical pharmacy student call [employer] to get the dates of his vaccines so that we can update his health maintenance.  Follow Up Instructions: 4 mths   I discussed the assessment and treatment plan with the patient. The patient was provided an opportunity to ask questions and all were answered. The patient agreed with the plan and demonstrated an understanding of the instructions.   The patient was advised to call back or seek an in-person evaluation if the symptoms worsen or if the condition fails to improve as anticipated.  I  Spent 25 minutes on this telephone encounter  This note has been created with Education officer, environmental. Any transcriptional errors are unintentional.  Jonah Blue, MD

## 2021-07-20 DIAGNOSIS — R6889 Other general symptoms and signs: Secondary | ICD-10-CM | POA: Diagnosis not present

## 2021-07-23 DIAGNOSIS — R6889 Other general symptoms and signs: Secondary | ICD-10-CM | POA: Diagnosis not present

## 2021-08-01 ENCOUNTER — Other Ambulatory Visit: Payer: Self-pay | Admitting: Cardiovascular Disease

## 2021-08-07 ENCOUNTER — Other Ambulatory Visit: Payer: Self-pay

## 2021-08-07 ENCOUNTER — Ambulatory Visit (INDEPENDENT_AMBULATORY_CARE_PROVIDER_SITE_OTHER): Payer: Medicare HMO

## 2021-08-07 DIAGNOSIS — Z5181 Encounter for therapeutic drug level monitoring: Secondary | ICD-10-CM

## 2021-08-07 DIAGNOSIS — I2699 Other pulmonary embolism without acute cor pulmonale: Secondary | ICD-10-CM | POA: Diagnosis not present

## 2021-08-07 DIAGNOSIS — I824Y1 Acute embolism and thrombosis of unspecified deep veins of right proximal lower extremity: Secondary | ICD-10-CM | POA: Diagnosis not present

## 2021-08-07 LAB — POCT INR: INR: 2.2 (ref 2.0–3.0)

## 2021-08-07 NOTE — Patient Instructions (Signed)
Continue taking Warfarin 1 tablet daily except for 1.5 tablets on Mondays, Wednesdays and Fridays. Recheck INR in 6 weeks. Coumadin Clinic [phone].

## 2021-09-02 DIAGNOSIS — R6889 Other general symptoms and signs: Secondary | ICD-10-CM | POA: Diagnosis not present

## 2021-09-23 ENCOUNTER — Ambulatory Visit (INDEPENDENT_AMBULATORY_CARE_PROVIDER_SITE_OTHER): Payer: Medicare HMO | Admitting: *Deleted

## 2021-09-23 DIAGNOSIS — I2699 Other pulmonary embolism without acute cor pulmonale: Secondary | ICD-10-CM

## 2021-09-23 DIAGNOSIS — I824Y1 Acute embolism and thrombosis of unspecified deep veins of right proximal lower extremity: Secondary | ICD-10-CM

## 2021-09-23 DIAGNOSIS — Z5181 Encounter for therapeutic drug level monitoring: Secondary | ICD-10-CM

## 2021-09-23 LAB — POCT INR: INR: 2.5 (ref 2.0–3.0)

## 2021-09-23 NOTE — Patient Instructions (Signed)
Description   Continue taking Warfarin 1 tablet daily except for 1.5 tablets on Mondays, Wednesdays and Fridays. Recheck INR in 6 weeks. Coumadin Clinic [phone].       

## 2021-10-07 ENCOUNTER — Other Ambulatory Visit: Payer: Self-pay | Admitting: Cardiovascular Disease

## 2021-10-07 DIAGNOSIS — I739 Peripheral vascular disease, unspecified: Secondary | ICD-10-CM

## 2021-10-14 ENCOUNTER — Other Ambulatory Visit: Payer: Self-pay | Admitting: Cardiovascular Disease

## 2021-10-21 DIAGNOSIS — H40013 Open angle with borderline findings, low risk, bilateral: Secondary | ICD-10-CM | POA: Diagnosis not present

## 2021-10-26 ENCOUNTER — Ambulatory Visit (INDEPENDENT_AMBULATORY_CARE_PROVIDER_SITE_OTHER): Payer: Medicare PPO | Admitting: Cardiovascular Disease

## 2021-10-26 ENCOUNTER — Encounter: Payer: Self-pay | Admitting: Cardiovascular Disease

## 2021-10-26 ENCOUNTER — Ambulatory Visit (INDEPENDENT_AMBULATORY_CARE_PROVIDER_SITE_OTHER): Payer: Medicare PPO | Admitting: *Deleted

## 2021-10-26 VITALS — BP 160/90 | HR 66 | Ht 69.0 in | Wt 244.2 lb

## 2021-10-26 DIAGNOSIS — I10 Essential (primary) hypertension: Secondary | ICD-10-CM | POA: Diagnosis not present

## 2021-10-26 DIAGNOSIS — I739 Peripheral vascular disease, unspecified: Secondary | ICD-10-CM | POA: Diagnosis not present

## 2021-10-26 DIAGNOSIS — I824Y1 Acute embolism and thrombosis of unspecified deep veins of right proximal lower extremity: Secondary | ICD-10-CM | POA: Diagnosis not present

## 2021-10-26 DIAGNOSIS — I2510 Atherosclerotic heart disease of native coronary artery without angina pectoris: Secondary | ICD-10-CM | POA: Diagnosis not present

## 2021-10-26 DIAGNOSIS — I2699 Other pulmonary embolism without acute cor pulmonale: Secondary | ICD-10-CM | POA: Diagnosis not present

## 2021-10-26 DIAGNOSIS — Z5181 Encounter for therapeutic drug level monitoring: Secondary | ICD-10-CM | POA: Diagnosis not present

## 2021-10-26 DIAGNOSIS — E785 Hyperlipidemia, unspecified: Secondary | ICD-10-CM | POA: Diagnosis not present

## 2021-10-26 LAB — POCT INR: INR: 2.4 (ref 2.0–3.0)

## 2021-10-26 MED ORDER — LISINOPRIL 40 MG PO TABS: 40.0000 mg | ORAL_TABLET | Freq: Every day | ORAL | 3 refills | Status: DC

## 2021-10-26 MED ORDER — METOPROLOL TARTRATE 25 MG PO TABS: 25.0000 mg | ORAL_TABLET | Freq: Two times a day (BID) | ORAL | 3 refills | Status: DC

## 2021-10-26 MED ORDER — WARFARIN SODIUM 5 MG PO TABS: ORAL_TABLET | ORAL | 0 refills | Status: DC

## 2021-10-26 NOTE — Patient Instructions (Signed)
Description   Continue taking Warfarin 1 tablet daily except for 1.5 tablets on Mondays, Wednesdays and Fridays. Recheck INR in 6 weeks. Coumadin Clinic [phone].       

## 2021-10-26 NOTE — Patient Instructions (Signed)
Medication Instructions:  ?Your physician has recommended you make the following change in your medication:  ?1.) increase lisinopril to 40 mg - one tablet daily ? ?*If you need a refill on your cardiac medications before your next appointment, please call your pharmacy* ? ? ?Lab Work: ?Please return in about 10 days for Lipids/Liver/BMET ? ?If you have labs (blood work) drawn today and your tests are completely normal, you will receive your results only by: ?MyChart Message (if you have MyChart) OR ?A paper copy in the mail ?If you have any lab test that is abnormal or we need to change your treatment, we will call you to review the results. ? ? ?Testing/Procedures: ?Your physician has requested that you have an echocardiogram. Echocardiography is a painless test that uses sound waves to create images of your heart. It provides your doctor with information about the size and shape of your heart and how well your heart?s chambers and valves are working. This procedure takes approximately one hour. There are no restrictions for this procedure. ? ? ?Follow-Up: ?At [facility], you and your health needs are our priority.  As part of our continuing mission to provide you with exceptional heart care, we have created designated Provider Care Teams.  These Care Teams include your primary Cardiologist (physician) and Advanced Practice Providers (APPs -  Physician Assistants and Nurse Practitioners) who all work together to provide you with the care you need, when you need it. ? ? ?Your next appointment:   ?12 month(s) ? ?The format for your next appointment:   ?In Person ? ?Provider:   ?Verne Carrow, MD  ? ? ?Other Instructions ? ? ?Important Information About Sugar ? ? ? ? ?  ?

## 2021-10-26 NOTE — Progress Notes (Signed)
? ?Chief Complaint  ?Patient presents with  ? Follow-up  ?  CAD  ? ?History of Present Illness: 61 yo male with history of CAD, HTN here today for cardiac follow up. He was admitted to [facility] 03/31/11 with anterior STEMI with occluded LAD now s/p DES x 1 proximal LAD. His cath was performed by Dr. Kirke Corin. Diffuse distal disease in the right posterolateral branch. The third OM was occluded and filled from left to left collaterals. Proximal LAD occlusion treated with 2.75 x 24 mm Promus Element DES x 1. He had a severe dye reaction. LV function was preserved by echo.  In August 2019 he had acute on chronic ischemia with severe tibial artery occlusive disease bilaterally leading to a right popliteal and tibial embolectomy with left femoral to below the knee popliteal bypass and left popliteal and tibial endarterectomy.  Also had acute right leg DVT and bilateral pulmonary emboli at that time. He has been on coumadin. Cardiac event monitor in January 2021 with sinus with rare PVCs, rare PACs and 2 short runs of SVT (4-5 beats).  ? ?He is here today for follow up. The patient denies any chest pain, dyspnea, palpitations, lower extremity edema, orthopnea, PND, dizziness, near syncope or syncope. BP has been elevated at home.  ? ?Primary Care Physician: Marcine Matar, MD ? ?Past Medical History:  ?Diagnosis Date  ? Alcohol abuse   ? Anterior myocardial infarction [facility])   ? ST-elevation; S/P emergent  drug-eluting stenting of proximal left anterior descending  ? Coronary artery disease   ? a. STEMI 2012 - s/p DES to LAD with  diffuse distal disease in the right posterolateral branch. The third OM was occluded and filled from left to left collaterals. LVEF preserved.  ? DVT complicating pregnancy   ? Dysuria 04/26/2013  ? Essential hypertension 05/16/2014  ? Frequent urination 09/03/2014  ? HNP (herniated nucleus pulposus), cervical 10/14/2014  ? Hyperlipidemia   ? Hypertension   ? Ischemia of extremity 01/23/2018  ? Lower  abdominal pain 09/03/2014  ? PAD (peripheral artery disease) (HCC)   ? Pre-diabetes   ? Premature atrial contraction   ? PSVT (paroxysmal supraventricular tachycardia) (HCC)   ? 2 short runs on monitor 06/2019  ? Pulmonary emboli (HCC)   ? PVC's (premature ventricular contractions)   ? Renal insufficiency   ? Special screening for malignant neoplasms, colon   ? ? ?Past Surgical History:  ?Procedure Laterality Date  ? COLONOSCOPY N/A 08/16/2014  ? Procedure: COLONOSCOPY;  Surgeon: West Bali, MD;  Location: [facility];  Service: Endoscopy;  Laterality: N/A;  10:15 AM  ? DOPPLER ECHOCARDIOGRAPHY    ? Preserved left venticular function  ? EMBOLECTOMY Right 01/23/2018  ? Procedure: RIGHT POPLITEAL-TIBIAL  ARTERY EMBOLECTOMY, EXPLORATION OF RIGHT ANTERIOR TIBIAL ARTERY, RIGHT POPLITEAL ARTERY VEIN ANGIOPLASTY;  Surgeon: Sherren Kerns, MD;  Location: [facility] OR;  Service: Vascular;  Laterality: Right;  ? ENDARTERECTOMY POPLITEAL Left 01/23/2018  ? Procedure: ENDARTERECTOMY OF LEFT POPLITEAL ARTERY AND TIBIAL-PERONEAL TRUNK;  Surgeon: Sherren Kerns, MD;  Location: [facility] OR;  Service: Vascular;  Laterality: Left;  ? FEMORAL-POPLITEAL BYPASS GRAFT Left 01/23/2018  ? Procedure: BYPASS GRAFT LEFT FEMORAL-POPLITEAL ARTERY WITH PROPATEN VASCULAR GRAFT;  Surgeon: Sherren Kerns, MD;  Location: [facility] OR;  Service: Vascular;  Laterality: Left;  ? HEMATOMA EVACUATION Left 01/25/2018  ? Procedure: EVACUATION HEMATOMA LEFT POPLITEAL SPACE;  Surgeon: Cephus Shelling, MD;  Location: [facility] OR;  Service: Vascular;  Laterality: Left;  ?  LEG SURGERY    ? left leg - has rod and 4 pins  ? PATELLAR TENDON REPAIR Right 04/26/2014  ? Procedure: RIGHT PATELLA TENDON REPAIR;  Surgeon: Sheral Apleyimothy D Murphy, MD;  Location: [facility];  Service: Orthopedics;  Laterality: Right;  ? ?Social History  ? ?Socioeconomic History  ? Marital status: Significant Other  ?  Spouse name: Not on file  ? Number of children: 0  ? Years of education: Not  on file  ? Highest education level: Not on file  ?Occupational History  ? Not on file  ?Tobacco Use  ? Smoking status: Never  ? Smokeless tobacco: Never  ?Substance and Sexual Activity  ? Alcohol use: Yes  ?  Comment: Drinks occassionally  ? Drug use: No  ? Sexual activity: Not on file  ?Other Topics Concern  ? Not on file  ?Social History Narrative  ? Not on file  ? ?Social Determinants of Health  ? ?Financial Resource Strain: Not on file  ?Food Insecurity: Not on file  ?Transportation Needs: Not on file  ?Physical Activity: Not on file  ?Stress: Not on file  ?Social Connections: Not on file  ?Intimate Partner Violence: Not on file  ? ? ?Family History  ?Problem Relation Age of Onset  ? Cancer Maternal Aunt   ? Hyperlipidemia Other   ? Diabetes Other   ? Heart disease Other   ? ?Meds:  ?Current Outpatient Medications on File Prior to Visit  ?Medication Sig Dispense Refill  ? aspirin EC 81 MG tablet Take 1 tablet (81 mg total) by mouth daily. 90 tablet 3  ? gabapentin (NEURONTIN) 300 MG capsule 1 cap PO Q a.m and 2 caps Q p.m 90 capsule 4  ? latanoprost (XALATAN) 0.005 % ophthalmic solution     ? metoprolol tartrate (LOPRESSOR) 25 MG tablet Take 1 tablet by mouth twice daily 30 tablet 0  ? nitroGLYCERIN (NITROSTAT) 0.4 MG SL tablet Place 1 tablet (0.4 mg total) under the tongue every 5 (five) minutes as needed for chest pain. 75 tablet 2  ? rosuvastatin (CRESTOR) 40 MG tablet Take 1 tablet by mouth once daily 90 tablet 0  ? warfarin (COUMADIN) 5 MG tablet TAKE 1 TO 1 & 1/2 TABLETS BY MOUTH ONCE DAILY AS DIRECTED BY COUMADIN CLINIC 40 tablet 3  ? silodosin (RAPAFLO) 8 MG CAPS capsule Take 8 mg by mouth at bedtime. (Patient not taking: Reported on 10/26/2021)    ? tamsulosin (FLOMAX) 0.4 MG CAPS capsule Take 1 capsule (0.4 mg total) by mouth at bedtime. (Patient not taking: Reported on 10/26/2021) 30 capsule 5  ? ?No current facility-administered medications on file prior to visit.  ? ?Review of Systems:  As stated in  the HPI and otherwise negative.  ? ?BP (!) 160/90   Pulse 66   Ht 5\' 9"  (1.753 m)   Wt 244 lb 3.2 oz (110.8 kg)   SpO2 99%   BMI 36.06 kg/m?  ? ?Physical Examination: ?General: Well developed, well nourished, NAD  ?HEENT: OP clear, mucus membranes moist  ?SKIN: warm, dry. No rashes. ?Neuro: No focal deficits  ?Musculoskeletal: Muscle strength 5/5 all ext  ?Psychiatric: Mood and affect normal  ?Neck: No JVD, no carotid bruits, no thyromegaly, no lymphadenopathy.  ?Lungs:Clear bilaterally, no wheezes, rhonci, crackles ?Cardiovascular: Regular rate and rhythm. No murmurs, gallops or rubs. ?Abdomen:Soft. Bowel sounds present. Non-tender.  ?Extremities: No lower extremity edema. Pulses are 2 + in the bilateral DP/PT. ? ?EKG:  EKG is  ordered today. ?The ekg ordered today demonstrates sinus ? ?Recent Labs: ?No results found for requested labs within last 8760 hours.  ? ?Lipid Panel ?   ?Component Value Date/Time  ? CHOL 137 08/04/2020 1015  ? TRIG 169 (H) 08/04/2020 1015  ? HDL 35 (L) 08/04/2020 1015  ? CHOLHDL 3.9 08/04/2020 1015  ? CHOLHDL 3.6 08/11/2016 1030  ? VLDL 19 08/11/2016 1030  ? LDLCALC 73 08/04/2020 1015  ? LDLDIRECT 164.1 05/04/2013 1107  ? ?  ?Wt Readings from Last 3 Encounters:  ?10/26/21 244 lb 3.2 oz (110.8 kg)  ?06/29/21 242 lb 9.6 oz (110 kg)  ?12/04/20 235 lb (106.6 kg)  ?  ? ?Other studies Reviewed: ?Additional studies/ records that were reviewed today include: . ?Review of the above records demonstrates:  ? ? ?Assessment and Plan:  ? ?1. CAD without angina: No chest pain. He initially presented with an anterior STEMI 03/31/11. A DES was placed in LAD. Moderate disease in other vessels. Continue ASA, statin and beta blocker. Echo to assess LVEF ? ?2. HTN: BP is elevated today and has been at home. Will increase Lisinopril to 40 mg daily. BMET in one week ? ?3. HLD: Lipids followed in primary care. LDL 73 in February 2022.  Continue statin. Repeat lipids and LFTs now.  ? ?4.  Lower extremity DVT:  He is on coumadin.  ? ?5. PAD: He is s/p left fem pop bypass. Followed in Vascular surgery.  ? ?Current medicines are reviewed at length with the patient today.  The patient does not have concerns regarding me

## 2021-10-26 NOTE — Addendum Note (Signed)
Addended by: Lendon Ka on: 10/26/2021 04:17 PM ? ? Modules accepted: Orders ? ?

## 2021-11-10 ENCOUNTER — Ambulatory Visit: Payer: Medicare PPO | Attending: Internal Medicine | Admitting: Internal Medicine

## 2021-11-10 ENCOUNTER — Ambulatory Visit (HOSPITAL_COMMUNITY): Payer: Medicare PPO | Attending: Cardiovascular Disease

## 2021-11-10 ENCOUNTER — Encounter: Payer: Self-pay | Admitting: Internal Medicine

## 2021-11-10 ENCOUNTER — Other Ambulatory Visit: Payer: Medicare PPO | Admitting: *Deleted

## 2021-11-10 VITALS — BP 160/99 | HR 66 | Wt 244.2 lb

## 2021-11-10 DIAGNOSIS — I2510 Atherosclerotic heart disease of native coronary artery without angina pectoris: Secondary | ICD-10-CM

## 2021-11-10 DIAGNOSIS — Z125 Encounter for screening for malignant neoplasm of prostate: Secondary | ICD-10-CM | POA: Diagnosis not present

## 2021-11-10 DIAGNOSIS — M25511 Pain in right shoulder: Secondary | ICD-10-CM

## 2021-11-10 DIAGNOSIS — I10 Essential (primary) hypertension: Secondary | ICD-10-CM

## 2021-11-10 DIAGNOSIS — I739 Peripheral vascular disease, unspecified: Secondary | ICD-10-CM | POA: Insufficient documentation

## 2021-11-10 DIAGNOSIS — G8929 Other chronic pain: Secondary | ICD-10-CM | POA: Diagnosis not present

## 2021-11-10 DIAGNOSIS — Z6836 Body mass index (BMI) 36.0-36.9, adult: Secondary | ICD-10-CM

## 2021-11-10 DIAGNOSIS — E785 Hyperlipidemia, unspecified: Secondary | ICD-10-CM

## 2021-11-10 DIAGNOSIS — M25561 Pain in right knee: Secondary | ICD-10-CM

## 2021-11-10 LAB — BASIC METABOLIC PANEL
BUN/Creatinine Ratio: 15 (ref 10–24)
BUN: 19 mg/dL (ref 8–27)
CO2: 25 mmol/L (ref 20–29)
Calcium: 9.2 mg/dL (ref 8.6–10.2)
Chloride: 100 mmol/L (ref 96–106)
Creatinine, Ser: 1.25 mg/dL (ref 0.76–1.27)
Glucose: 107 mg/dL — ABNORMAL HIGH (ref 70–99)
Potassium: 4.5 mmol/L (ref 3.5–5.2)
Sodium: 137 mmol/L (ref 134–144)
eGFR: 66 mL/min/{1.73_m2} (ref >59)

## 2021-11-10 LAB — HEPATIC FUNCTION PANEL
ALT: 36 IU/L (ref 0–44)
AST: 26 IU/L (ref 0–40)
Albumin: 4.4 g/dL (ref 3.8–4.9)
Alkaline Phosphatase: 74 IU/L (ref 44–121)
Bilirubin Total: 0.3 mg/dL (ref 0.0–1.2)
Bilirubin, Direct: <0.1 mg/dL (ref 0.00–0.40)
Total Protein: 7.4 g/dL (ref 6.0–8.5)

## 2021-11-10 LAB — LIPID PANEL
Chol/HDL Ratio: 4.3 ratio (ref 0.0–5.0)
Cholesterol, Total: 147 mg/dL (ref 100–199)
HDL: 34 mg/dL — ABNORMAL LOW (ref >39)
LDL Chol Calc (NIH): 91 mg/dL (ref 0–99)
Triglycerides: 121 mg/dL (ref 0–149)
VLDL Cholesterol Cal: 22 mg/dL (ref 5–40)

## 2021-11-10 LAB — ECHOCARDIOGRAM COMPLETE
Area-P 1/2: 4.26 cm2
S' Lateral: 4.9 cm

## 2021-11-10 MED ORDER — AMLODIPINE BESYLATE 5 MG PO TABS: 5.0000 mg | ORAL_TABLET | Freq: Every day | ORAL | 5 refills | Status: DC

## 2021-11-10 NOTE — Progress Notes (Signed)
Patient ID: ALANA DELO, male    DOB: [birth year]  MRN: [id number]  CC: Hypertension and Knee Pain   Subjective: Job Savard is a 61 y.o. male who presents for chronic ds management. His concerns today include:  Patient with history of HTN, CAD, PAD, PE/DVT on Coumadin followed at cardiology anticoag clinic, HL, CVA by MRI 11/2018, mild carotid artery disease, prediabetes.  Chronic lower back pain  Obesity:  doing better with eating habits.  He has eliminated fried foods from his diet.  Drinking mainly water. Goes to gym 2 x a wk  HTN/CAD: Denies any chest pains or shortness of breath.  Reports compliance with taking aspirin, amlodipine, lisinopril, metoprolol and Crestor.  Lisinopril increased on his recent visit with Dr. Clifton James because blood pressure was not at goal. Had echo done today.  Requesting referral to see orthopedics Dr. Roda Shutters for his right knee.  Had surgery on the knee several years ago.  Lately the knee has been giving out on him at times.  He ambulates with a cane.  The knee is sore. Requesting to have a form signed for him to get handicap sticker due to issue with his knee and chronic back pain.  Complains of pain in the right shoulder that started about a week ago after he lifted a bike onto the bed of his truck.  He felt like a pop.  It has been painful.  He had some bruising on the upper inner arm.  He has been taking Tylenol and using an anti-inflammatory pain rub.  He has not noted any swelling.  Initially the pain was severe.  Now its at 7/10.  Worse with certain movements.  He does weightlifting at the gym but has held off on doing that until he saw me today.  And wants to know whether he can return to weightlifting.  Patient Active Problem List   Diagnosis Date Noted   Prediabetes 12/04/2020   Obesity (BMI 30.0-34.9) 12/04/2020   DVT, lower extremity (HCC) 03/14/2019   History of pulmonary embolism 09/11/2018   Obsessive-compulsive disorder    Muscle  cramps    Postoperative pain    Therapeutic opioid induced constipation    CKD (chronic kidney disease), stage II    Acute blood loss anemia    Debility    Ischemia of extremity 01/23/2018   Frequency of urination and polyuria 07/06/2017   Erectile dysfunction due to diseases classified elsewhere 10/27/2016   PAD (peripheral artery disease) (HCC) 08/11/2016   Nocturia 08/11/2016   Chronic pain of both knees 04/01/2016   Palpitations 05/05/2015   Hyperlipidemia 05/05/2015   HNP (herniated nucleus pulposus), cervical 10/14/2014   Chronic low back pain 10/14/2014   Numbness and tingling of both upper extremities while sleeping 10/14/2014   Frequent urination 09/03/2014   Lower abdominal pain 09/03/2014   Cervical stenosis of spine 05/16/2014   Essential hypertension 05/16/2014   Numbness and tingling of right arm 11/28/2013   Dental abscess 04/26/2013   Dysuria 04/26/2013   HTN (hypertension) 04/04/2013   CAD (coronary artery disease) 05/25/2011     Current Outpatient Medications on File Prior to Visit  Medication Sig Dispense Refill   aspirin EC 81 MG tablet Take 1 tablet (81 mg total) by mouth daily. 90 tablet 3   gabapentin (NEURONTIN) 300 MG capsule 1 cap PO Q a.m and 2 caps Q p.m 90 capsule 4   latanoprost (XALATAN) 0.005 % ophthalmic solution      lisinopril (ZESTRIL)  40 MG tablet Take 1 tablet (40 mg total) by mouth daily. 90 tablet 3   metoprolol tartrate (LOPRESSOR) 25 MG tablet Take 1 tablet (25 mg total) by mouth 2 (two) times daily. 180 tablet 3   nitroGLYCERIN (NITROSTAT) 0.4 MG SL tablet Place 1 tablet (0.4 mg total) under the tongue every 5 (five) minutes as needed for chest pain. 75 tablet 2   rosuvastatin (CRESTOR) 40 MG tablet Take 1 tablet by mouth once daily 90 tablet 0   warfarin (COUMADIN) 5 MG tablet TAKE 1 TO 1 & 1/2 TABLETS BY MOUTH ONCE DAILY AS DIRECTED BY COUMADIN CLINIC 120 tablet 0   silodosin (RAPAFLO) 8 MG CAPS capsule Take 8 mg by mouth at bedtime.  (Patient not taking: Reported on 10/26/2021)     tamsulosin (FLOMAX) 0.4 MG CAPS capsule Take 1 capsule (0.4 mg total) by mouth at bedtime. (Patient not taking: Reported on 10/26/2021) 30 capsule 5   No current facility-administered medications on file prior to visit.    Allergies  Allergen Reactions   Ivp Dye [Iodinated Contrast Media] Swelling    Social History   Socioeconomic History   Marital status: Significant Other    Spouse name: Not on file   Number of children: 0   Years of education: Not on file   Highest education level: Not on file  Occupational History   Not on file  Tobacco Use   Smoking status: Never   Smokeless tobacco: Never  Substance and Sexual Activity   Alcohol use: Yes    Comment: Drinks occassionally   Drug use: No   Sexual activity: Not on file  Other Topics Concern   Not on file  Social History Narrative   Not on file   Social Determinants of Health   Financial Resource Strain: Not on file  Food Insecurity: Not on file  Transportation Needs: Not on file  Physical Activity: Not on file  Stress: Not on file  Social Connections: Not on file  Intimate Partner Violence: Not on file    Family History  Problem Relation Age of Onset   Cancer Maternal Aunt    Hyperlipidemia Other    Diabetes Other    Heart disease Other     Past Surgical History:  Procedure Laterality Date   COLONOSCOPY N/A 08/16/2014   Procedure: COLONOSCOPY;  Surgeon: Danie Binder, MD;  [facility];  Service: Endoscopy;  Laterality: N/A;  10:15 AM   DOPPLER ECHOCARDIOGRAPHY     Preserved left venticular function   EMBOLECTOMY Right 01/23/2018   Procedure: RIGHT POPLITEAL-TIBIAL  ARTERY EMBOLECTOMY, EXPLORATION OF RIGHT ANTERIOR TIBIAL ARTERY, RIGHT POPLITEAL ARTERY VEIN ANGIOPLASTY;  Surgeon: Elam Dutch, MD;  Location: [place];  Service: Vascular;  Laterality: Right;   ENDARTERECTOMY POPLITEAL Left 01/23/2018   Procedure: ENDARTERECTOMY OF LEFT POPLITEAL  ARTERY AND TIBIAL-PERONEAL TRUNK;  Surgeon: Elam Dutch, MD;  Location: [place];  Service: Vascular;  Laterality: Left;   FEMORAL-POPLITEAL BYPASS GRAFT Left 01/23/2018   Procedure: BYPASS GRAFT LEFT FEMORAL-POPLITEAL ARTERY WITH PROPATEN VASCULAR GRAFT;  Surgeon: Elam Dutch, MD;  Location: [place];  Service: Vascular;  Laterality: Left;   HEMATOMA EVACUATION Left 01/25/2018   Procedure: EVACUATION HEMATOMA LEFT POPLITEAL SPACE;  Surgeon: Marty Heck, MD;  Location: [place];  Service: Vascular;  Laterality: Left;   LEG SURGERY     left leg - has rod and 4 pins   PATELLAR TENDON REPAIR Right 04/26/2014   Procedure: RIGHT PATELLA  TENDON REPAIR;  Surgeon: Renette Butters, MD;  Location: [place];  Service: Orthopedics;  Laterality: Right;    ROS: Review of Systems Negative except as stated above  PHYSICAL EXAM: BP (!) 160/99   Pulse 66   Wt 244 lb 3.2 oz (110.8 kg)   SpO2 97%   BMI 36.06 kg/m   Wt Readings from Last 3 Encounters:  11/10/21 244 lb 3.2 oz (110.8 kg)  10/26/21 244 lb 3.2 oz (110.8 kg)  06/29/21 242 lb 9.6 oz (110 kg)    Physical Exam   General appearance - alert, well appearing, and in no distress Mental status - normal mood, behavior, speech, dress, motor activity, and thought processes Neck - supple, no significant adenopathy Chest - clear to auscultation, no wheezes, rales or rhonchi, symmetric air entry Heart - normal rate, regular rhythm, normal S1, S2, no murmurs, rubs, clicks or gallops Musculoskeletal -right shoulder: Has small area of resolving ecchymosis on the upper inner arm.  Slight tenderness on palpation of the anterior joint line.  Discomfort with decreased range of motion in all directions.  Drop arm test is positive.  No tenderness on palpation over the bicep muscle. Extremities -no lower extremity edema     Latest Ref Rng & Units 08/04/2020   10:15 AM 12/01/2018   12:45 PM 12/01/2018   12:27 PM  CMP  Glucose 65 -  99 mg/dL 75   115   122    BUN 6 - 24 mg/dL 21   21   20     Creatinine 0.76 - 1.27 mg/dL 1.23   1.20   1.19    Sodium 134 - 144 mmol/L 139   137   138    Potassium 3.5 - 5.2 mmol/L 4.4   3.9   4.0    Chloride 96 - 106 mmol/L 102   103   105    CO2 20 - 29 mmol/L 24    26    Calcium 8.7 - 10.2 mg/dL 9.4    9.0    Total Protein 6.0 - 8.5 g/dL 6.6    6.3    Total Bilirubin 0.0 - 1.2 mg/dL 0.2    0.4    Alkaline Phos 44 - 121 IU/L 81    49    AST 0 - 40 IU/L 20    19    ALT 0 - 44 IU/L 33    25     Lipid Panel     Component Value Date/Time   CHOL 137 08/04/2020 1015   TRIG 169 (H) 08/04/2020 1015   HDL 35 (L) 08/04/2020 1015   CHOLHDL 3.9 08/04/2020 1015   CHOLHDL 3.6 08/11/2016 1030   VLDL 19 08/11/2016 1030   LDLCALC 73 08/04/2020 1015   LDLDIRECT 164.1 05/04/2013 1107    CBC    Component Value Date/Time   WBC 5.4 08/04/2020 1015   WBC 5.7 12/01/2018 1227   RBC 5.53 08/04/2020 1015   RBC 5.17 12/01/2018 1227   HGB 14.8 08/04/2020 1015   HCT 45.4 08/04/2020 1015   PLT 216 08/04/2020 1015   MCV 82 08/04/2020 1015   MCH 26.8 08/04/2020 1015   MCH 27.7 12/01/2018 1227   MCHC 32.6 08/04/2020 1015   MCHC 31.8 12/01/2018 1227   RDW 12.3 08/04/2020 1015   LYMPHSABS 1.8 12/01/2018 1227   MONOABS 0.6 12/01/2018 1227   EOSABS 0.1 12/01/2018 1227   BASOSABS 0.0 12/01/2018 1227    ASSESSMENT AND PLAN:  1. Essential hypertension Not at goal.  I recommend adding a low-dose of amlodipine 5 mg.  Continue lisinopril 40 mg daily and metoprolol 25 mg twice a day. - amLODipine (NORVASC) 5 MG tablet; Take 1 tablet (5 mg total) by mouth daily.  Dispense: 30 tablet; Refill: 5 - CBC  2. Acute pain of right shoulder Most likely rotator cuff injury.  We will refer him to orthopedics.  Advised to hold off on any weight lifting at the gym. - Ambulatory referral to Orthopedic Surgery  3. Chronic pain of right knee - Ambulatory referral to Orthopedic Surgery  4. Class 2 severe obesity  with serious comorbidity and body mass index (BMI) of 36.0 to 36.9 in adult, unspecified obesity type [facility]) Encouraged him to continue to try to eat healthy.  Advised that he can continue going to the gym for aerobic exercise but avoid any weight lifting until the right shoulder is fully evaluated and healed - Hemoglobin A1c  5. Coronary artery disease involving native coronary artery of native heart without angina pectoris Stable.  Followed by cardiology.  Continue Crestor, metoprolol and aspirin  6. Prostate cancer screening Patient agreeable to prostate cancer screening with PSA level checked. - PSA   Patient was given the opportunity to ask questions.  Patient verbalized understanding of the plan and was able to repeat key elements of the plan.   This documentation was completed using Radio producer.  Any transcriptional errors are unintentional.  Orders Placed This Encounter  Procedures   CBC   Hemoglobin A1c   PSA   Ambulatory referral to Orthopedic Surgery     Requested Prescriptions   Signed Prescriptions Disp Refills   amLODipine (NORVASC) 5 MG tablet 30 tablet 5    Sig: Take 1 tablet (5 mg total) by mouth daily.    Return in about 4 months (around 03/13/2022) for Appt with [facility] in 3 wks for BP check.  Karle Plumber, MD, FACP

## 2021-11-16 ENCOUNTER — Ambulatory Visit: Payer: Medicare PPO | Attending: Internal Medicine

## 2021-11-16 DIAGNOSIS — Z125 Encounter for screening for malignant neoplasm of prostate: Secondary | ICD-10-CM | POA: Diagnosis not present

## 2021-11-16 DIAGNOSIS — I10 Essential (primary) hypertension: Secondary | ICD-10-CM | POA: Diagnosis not present

## 2021-11-16 DIAGNOSIS — Z6836 Body mass index (BMI) 36.0-36.9, adult: Secondary | ICD-10-CM | POA: Diagnosis not present

## 2021-11-17 ENCOUNTER — Other Ambulatory Visit: Payer: Self-pay | Admitting: Nurse Practitioner

## 2021-11-17 DIAGNOSIS — G8929 Other chronic pain: Secondary | ICD-10-CM

## 2021-11-17 LAB — PSA: Prostate Specific Ag, Serum: 1.1 ng/mL (ref 0.0–4.0)

## 2021-11-17 LAB — CBC
Hematocrit: 39.6 % (ref 37.5–51.0)
Hemoglobin: 13.1 g/dL (ref 13.0–17.7)
MCH: 27.3 pg (ref 26.6–33.0)
MCHC: 33.1 g/dL (ref 31.5–35.7)
MCV: 83 fL (ref 79–97)
Platelets: 198 10*3/uL (ref 150–450)
RBC: 4.8 x10E6/uL (ref 4.14–5.80)
RDW: 12.9 % (ref 11.6–15.4)
WBC: 5.8 10*3/uL (ref 3.4–10.8)

## 2021-11-17 LAB — HEMOGLOBIN A1C
Est. average glucose Bld gHb Est-mCnc: 131 mg/dL
Hgb A1c MFr Bld: 6.2 % — ABNORMAL HIGH (ref 4.8–5.6)

## 2021-11-17 NOTE — Telephone Encounter (Signed)
Requested Prescriptions  Pending Prescriptions Disp Refills  . gabapentin (NEURONTIN) 300 MG capsule [Pharmacy Med Name: Gabapentin 300 MG Oral Capsule] 270 capsule 0    Sig: TAKE 1 CAPSULE BY MOUTH THREE TIMES DAILY     Neurology: Anticonvulsants - gabapentin Passed - 11/17/2021 12:00 PM      Passed - Cr in normal range and within 360 days    Creat  Date Value Ref Range Status  08/11/2016 1.16 0.70 - 1.33 mg/dL Final    Comment:      For patients > or = 61 years of age: The upper reference limit for Creatinine is approximately 13% higher for people identified as African-American.      Creatinine, Ser  Date Value Ref Range Status  11/10/2021 1.25 0.76 - 1.27 mg/dL Final         Passed - Completed PHQ-2 or PHQ-9 in the last 360 days      Passed - Valid encounter within last 12 months    Recent Outpatient Visits          1 week ago Essential hypertension   [facility] [place], Gavin Pound B, MD   4 months ago Chronic low back pain, unspecified back pain laterality, unspecified whether sciatica present   [facility] Marcine Matar, MD   4 months ago Encounter for [employer] annual wellness exam   [facility] [place], [place], [place]   6 months ago Chronic left-sided lumbar radiculopathy   [facility] [place], Shea Stakes, NP   11 months ago Chronic left-sided lumbar radiculopathy   [facility] Marcine Matar, MD

## 2021-11-19 ENCOUNTER — Ambulatory Visit (INDEPENDENT_AMBULATORY_CARE_PROVIDER_SITE_OTHER): Payer: Medicare PPO

## 2021-11-19 ENCOUNTER — Ambulatory Visit (INDEPENDENT_AMBULATORY_CARE_PROVIDER_SITE_OTHER): Payer: Medicare PPO | Admitting: Orthopaedic Surgery

## 2021-11-19 ENCOUNTER — Encounter: Payer: Self-pay | Admitting: Orthopaedic Surgery

## 2021-11-19 DIAGNOSIS — M25511 Pain in right shoulder: Secondary | ICD-10-CM

## 2021-11-19 DIAGNOSIS — M1711 Unilateral primary osteoarthritis, right knee: Secondary | ICD-10-CM | POA: Diagnosis not present

## 2021-11-19 MED ORDER — BUPIVACAINE HCL 0.25 % IJ SOLN
2.0000 mL | INTRAMUSCULAR | Status: AC | PRN
  Administered 2021-11-19: 2 mL via INTRA_ARTICULAR

## 2021-11-19 MED ORDER — TRAMADOL HCL 50 MG PO TABS: 50.0000 mg | ORAL_TABLET | Freq: Two times a day (BID) | ORAL | 2 refills | Status: DC | PRN

## 2021-11-19 MED ORDER — METHOCARBAMOL 500 MG PO TABS: 500.0000 mg | ORAL_TABLET | Freq: Two times a day (BID) | ORAL | 2 refills | Status: DC | PRN

## 2021-11-19 MED ORDER — LIDOCAINE HCL 1 % IJ SOLN
2.0000 mL | INTRAMUSCULAR | Status: AC | PRN
  Administered 2021-11-19: 2 mL

## 2021-11-19 MED ORDER — METHYLPREDNISOLONE ACETATE 40 MG/ML IJ SUSP
40.0000 mg | INTRAMUSCULAR | Status: AC | PRN
  Administered 2021-11-19: 40 mg via INTRA_ARTICULAR

## 2021-11-19 NOTE — Progress Notes (Signed)
Office Visit Note   Patient: Michael [name]           Date of Birth: [birth year]           MRN: [id number] Visit Date: 11/19/2021              Requested by: Michael Pier, MD [place],  [place] [id number] PCP: Michael Pier, MD   Assessment & Plan: Visit Diagnoses:  1. Primary osteoarthritis of right knee   2. Acute pain of right shoulder     Plan: Impression is right knee osteoarthritis and right shoulder injury concerning for rotator cuff and proximal biceps tears.  In regards to his right knee, we have discussed repeat cortisone injection for which she is agreeable to.  In regards to the shoulder, would like to go ahead and order an MRI to assess for structural abnormalities.  He will follow-up with [place] once completed.  I called in tramadol and Robaxin to take in the meantime.  Call with concerns or questions.  Follow-Up Instructions: Return for after right shoulder MRI.   Orders:  Orders Placed This Encounter  Procedures   Large Joint Inj: R knee   XR Shoulder Right   XR KNEE 3 VIEW RIGHT   MR Elbow Right w/o contrast   Meds ordered this encounter  Medications   traMADol (ULTRAM) 50 MG tablet    Sig: Take 1 tablet (50 mg total) by mouth every 12 (twelve) hours as needed.    Dispense:  30 tablet    Refill:  2   methocarbamol (ROBAXIN) 500 MG tablet    Sig: Take 1 tablet (500 mg total) by mouth 2 (two) times daily as needed for muscle spasms.    Dispense:  20 tablet    Refill:  2      Procedures: Large Joint Inj: R knee on 11/19/2021 9:45 AM Indications: pain Details: 22 G needle, anterolateral approach Medications: 2 mL lidocaine 1 %; 2 mL bupivacaine 0.25 %; 40 mg methylPREDNISolone acetate 40 MG/ML      Clinical Data: No additional findings.   Subjective: Chief Complaint  Patient presents with   Right Shoulder - Pain   Right Knee - Pain    HPI patient is a very pleasant 61 year old gentleman who comes in today with  recurrent right knee pain as well as new onset right shoulder pain.  In regards to his right knee, history of osteoarthritis as well as right knee patella tendon repair by Dr. Edmonia [name] in 2015.  Patient has had arthritic type pain since.  No new injury.  He also notices instability.  He has been taking Voltaren and Tylenol without significant relief.  Cortisone injection performed in the right knee by [place] a little over 4 years ago with significant relief.  In regards to his right shoulder, he has had pain for the past 3 weeks.  He was lifting an exercise bike out of the truck when all of a sudden he had pain and a feeling of giving way to the right shoulder.  He had initial bruising which is since resolved.  Symptoms appear to be worse with most movements of the shoulder.  He has associated weakness.  No previous right shoulder surgery.  Review of Systems as detailed in HPI.  All others reviewed and are negative.   Objective: Vital Signs: There were no vitals taken for this visit.  Physical Exam well-developed well-nourished gentleman in no acute distress.  Alert and oriented x3.  Ortho Exam right knee exam shows no effusion.  Range of motion 10 to 125 degrees.  He does have medial joint line tenderness.  He is able to straight leg raise.  He is stable to valgus and varus stress.  He is neurovascular intact distally.  Right shoulder exam reveals no ecchymosis.  No Popeye deformity.  Forward flexion to approximately 160 degrees but pain past 90 degrees.  Very limited external rotation.  He can internally rotate to his back pocket.  Positive empty can with 2 out of 5 strength with resistance.  Positive speeds test.  Negative drop arm test and negative O'Brien's test.  Negative belly press.  He is neurovascular intact distally.  Specialty Comments:  No specialty comments available.  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  No superior migration of the humeral head.    PMFS History: Patient Active Problem List   Diagnosis Date Noted   Prediabetes 12/04/2020   Obesity (BMI 30.0-34.9) 12/04/2020   DVT, lower extremity ([place]) 03/14/2019   History of pulmonary embolism 09/11/2018   Obsessive-compulsive disorder    Muscle cramps    Postoperative pain    Therapeutic opioid induced constipation    CKD (chronic kidney disease), stage II    Acute blood loss anemia    Debility    Ischemia of extremity 01/23/2018   Frequency of urination and polyuria 07/06/2017   Erectile dysfunction due to diseases classified elsewhere 10/27/2016   PAD (peripheral artery disease) ([place]) 08/11/2016   Nocturia 08/11/2016   Chronic pain of both knees 04/01/2016   Palpitations 05/05/2015   Hyperlipidemia 05/05/2015   HNP (herniated nucleus pulposus), cervical 10/14/2014   Chronic low back pain 10/14/2014   Numbness and tingling of both upper extremities while sleeping 10/14/2014   Frequent urination 09/03/2014   Lower abdominal pain 09/03/2014   Cervical stenosis of spine 05/16/2014   Essential hypertension 05/16/2014   Numbness and tingling of right arm 11/28/2013   Dental abscess 04/26/2013   Dysuria 04/26/2013   HTN (hypertension) 04/04/2013   CAD (coronary artery disease) 05/25/2011   Past Medical History:  Diagnosis Date   Alcohol abuse    Anterior myocardial infarction [facility])    ST-elevation; S/P emergent  drug-eluting stenting of proximal left anterior descending   Coronary artery disease    a. STEMI 2012 - s/p DES to LAD with  diffuse distal disease in the right posterolateral branch. The third OM was occluded and filled from left to left collaterals. LVEF preserved.   DVT complicating pregnancy    Dysuria 04/26/2013   Essential hypertension 05/16/2014   Frequent urination  09/03/2014   HNP (herniated nucleus pulposus), cervical 10/14/2014   Hyperlipidemia    Hypertension    Ischemia of extremity 01/23/2018   Lower abdominal pain 09/03/2014   PAD (peripheral artery disease) (HCC)    Pre-diabetes    Premature atrial contraction    PSVT (paroxysmal supraventricular tachycardia) ([place])    2 short runs on monitor 06/2019   Pulmonary emboli (HCC)    PVC's (premature ventricular contractions)    Renal insufficiency    Special screening for malignant neoplasms, colon     Family History  Problem Relation Age of Onset   Cancer Maternal Aunt    Hyperlipidemia Other    Diabetes Other  Heart disease Other     Past Surgical History:  Procedure Laterality Date   COLONOSCOPY N/A 08/16/2014   Procedure: COLONOSCOPY;  Surgeon: Danie Binder, MD;  [facility];  Service: Endoscopy;  Laterality: N/A;  10:15 AM   DOPPLER ECHOCARDIOGRAPHY     Preserved left venticular function   EMBOLECTOMY Right 01/23/2018   Procedure: RIGHT POPLITEAL-TIBIAL  ARTERY EMBOLECTOMY, EXPLORATION OF RIGHT ANTERIOR TIBIAL ARTERY, RIGHT POPLITEAL ARTERY VEIN ANGIOPLASTY;  Surgeon: Elam Dutch, MD;  Location: [place];  Service: Vascular;  Laterality: Right;   ENDARTERECTOMY POPLITEAL Left 01/23/2018   Procedure: ENDARTERECTOMY OF LEFT POPLITEAL ARTERY AND TIBIAL-PERONEAL TRUNK;  Surgeon: Elam Dutch, MD;  Location: [place];  Service: Vascular;  Laterality: Left;   FEMORAL-POPLITEAL BYPASS GRAFT Left 01/23/2018   Procedure: BYPASS GRAFT LEFT FEMORAL-POPLITEAL ARTERY WITH PROPATEN VASCULAR GRAFT;  Surgeon: Elam Dutch, MD;  Location: [place];  Service: Vascular;  Laterality: Left;   HEMATOMA EVACUATION Left 01/25/2018   Procedure: EVACUATION HEMATOMA LEFT POPLITEAL SPACE;  Surgeon: Marty Heck, MD;  Location: [place];  Service: Vascular;  Laterality: Left;   LEG SURGERY     left leg - has rod and 4 pins   PATELLAR TENDON REPAIR Right 04/26/2014   Procedure: RIGHT PATELLA  TENDON REPAIR;  Surgeon: Renette Butters, MD;  Location: [place];  Service: Orthopedics;  Laterality: Right;   Social History   Occupational History   Not on file  Tobacco Use   Smoking status: Never   Smokeless tobacco: Never  Substance and Sexual Activity   Alcohol use: Yes    Comment: Drinks occassionally   Drug use: No   Sexual activity: Not on file

## 2021-11-24 DIAGNOSIS — H409 Unspecified glaucoma: Secondary | ICD-10-CM | POA: Diagnosis not present

## 2021-11-24 DIAGNOSIS — M1990 Unspecified osteoarthritis, unspecified site: Secondary | ICD-10-CM | POA: Diagnosis not present

## 2021-11-24 DIAGNOSIS — I739 Peripheral vascular disease, unspecified: Secondary | ICD-10-CM | POA: Diagnosis not present

## 2021-11-24 DIAGNOSIS — I252 Old myocardial infarction: Secondary | ICD-10-CM | POA: Diagnosis not present

## 2021-11-24 DIAGNOSIS — I10 Essential (primary) hypertension: Secondary | ICD-10-CM | POA: Diagnosis not present

## 2021-11-24 DIAGNOSIS — G629 Polyneuropathy, unspecified: Secondary | ICD-10-CM | POA: Diagnosis not present

## 2021-11-24 DIAGNOSIS — E785 Hyperlipidemia, unspecified: Secondary | ICD-10-CM | POA: Diagnosis not present

## 2021-11-24 DIAGNOSIS — I2510 Atherosclerotic heart disease of native coronary artery without angina pectoris: Secondary | ICD-10-CM | POA: Diagnosis not present

## 2021-12-02 ENCOUNTER — Telehealth: Payer: Self-pay | Admitting: *Deleted

## 2021-12-02 NOTE — Telephone Encounter (Signed)
Tried calling pt, no answer, left vm to return call.

## 2021-12-02 NOTE — Telephone Encounter (Signed)
-----   Message from Michael [name] sent at 12/01/2021  8:27 AM EDT ----- Patient called. Says his MRI referral was sent to [facility]. His insurance does not cover [facility]. He would like a call back.

## 2021-12-07 ENCOUNTER — Ambulatory Visit: Payer: Self-pay | Admitting: *Deleted

## 2021-12-07 NOTE — Telephone Encounter (Signed)
Pt calling for referral for MRI, pt has called incorrect office. Orth referred for MRI. Explained to pt to call them. I also explained that he would need to let them know that his insurance only covered 10 pills, not 30. Ortho is responsible for both referral and rx.  Reason for Disposition  [1] Prescription refill request for ESSENTIAL medicine (i.e., likelihood of harm to patient if not taken) AND [2] triager unable to refill per department policy  Answer Assessment - Initial Assessment Questions 1. REASON FOR CALL or QUESTION: "What is your reason for calling today?" or "How can I best help you?" or "What question do you have that I can help answer?"     Was referred for MRI to [facility] which his insurance does not cover. Pt wanting referral for MRI to a [facility] facility.  Protocols used: Information Only Call - No Triage-A-AH, Medication Refill and Renewal Call-A-AH

## 2021-12-07 NOTE — Telephone Encounter (Signed)
MRI was ordered form Ortho and he states that he has spoken to someone regarding the MRI

## 2021-12-07 NOTE — Telephone Encounter (Signed)
Patient returning call back for a different referfal, since [facility] isnt in his network. Please call back

## 2021-12-09 ENCOUNTER — Other Ambulatory Visit: Payer: Self-pay | Admitting: Internal Medicine

## 2021-12-09 DIAGNOSIS — R3589 Other polyuria: Secondary | ICD-10-CM

## 2021-12-09 NOTE — Telephone Encounter (Signed)
Requested Prescriptions  Pending Prescriptions Disp Refills  . tamsulosin (FLOMAX) 0.4 MG CAPS capsule [Pharmacy Med Name: Tamsulosin HCl 0.4 MG Oral Capsule] 30 capsule 0    Sig: Take 1 capsule by mouth at bedtime     Urology: Alpha-Adrenergic Blocker Failed - 12/09/2021  9:36 AM      Failed - Last BP in normal range    BP Readings from Last 1 Encounters:  11/10/21 (!) 160/99         Passed - PSA in normal range and within 360 days    PSA  Date Value Ref Range Status  08/11/2016 1.0 <=4.0 ng/mL Final    Comment:      The total PSA value from this assay system is standardized against the WHO standard. The test result will be approximately 20% lower when compared to the equimolar-standardized total PSA (Beckman Coulter). Comparison of serial PSA results should be interpreted with this fact in mind.   This test was performed using the Siemens chemiluminescent method. Values obtained from different assay methods cannot be used interchangeably. PSA levels, regardless of value, should not be interpreted as absolute evidence of the presence or absence of disease.      Prostate Specific Ag, Serum  Date Value Ref Range Status  11/16/2021 1.1 0.0 - 4.0 ng/mL Final    Comment:    Roche ECLIA methodology. According to the American Urological Association, Serum PSA should decrease and remain at undetectable levels after radical prostatectomy. The AUA defines biochemical recurrence as an initial PSA value 0.2 ng/mL or greater followed by a subsequent confirmatory PSA value 0.2 ng/mL or greater. Values obtained with different assay methods or kits cannot be used interchangeably. Results cannot be interpreted as absolute evidence of the presence or absence of malignant disease.          Passed - Valid encounter within last 12 months    Recent Outpatient Visits          4 weeks ago Essential hypertension   [facility] [place], Gavin Pound B, MD   5 months  ago Chronic low back pain, unspecified back pain laterality, unspecified whether sciatica present   [facility] Marcine Matar, MD   5 months ago Encounter for [employer] annual wellness exam   [facility] [place] [facility], [place], [place]   7 months ago Chronic left-sided lumbar radiculopathy   [facility] [place], Shea Stakes, NP   1 year ago Chronic left-sided lumbar radiculopathy   [facility] Marcine Matar, MD

## 2021-12-11 ENCOUNTER — Ambulatory Visit (INDEPENDENT_AMBULATORY_CARE_PROVIDER_SITE_OTHER): Payer: Medicare PPO

## 2021-12-11 DIAGNOSIS — Z5181 Encounter for therapeutic drug level monitoring: Secondary | ICD-10-CM

## 2021-12-11 DIAGNOSIS — I824Y1 Acute embolism and thrombosis of unspecified deep veins of right proximal lower extremity: Secondary | ICD-10-CM

## 2021-12-11 DIAGNOSIS — I2699 Other pulmonary embolism without acute cor pulmonale: Secondary | ICD-10-CM | POA: Diagnosis not present

## 2021-12-11 LAB — POCT INR: INR: 3 (ref 2.0–3.0)

## 2021-12-11 NOTE — Patient Instructions (Signed)
Patient is taking Warfarin 1 tablet daily except for 2 tablets on Mondays, Wednesdays and Fridays. Recheck INR in 6 weeks. Coumadin Clinic [phone].

## 2021-12-13 ENCOUNTER — Other Ambulatory Visit: Payer: Self-pay | Admitting: Physician Assistant

## 2021-12-13 MED ORDER — TRAMADOL HCL 50 MG PO TABS: 50.0000 mg | ORAL_TABLET | Freq: Four times a day (QID) | ORAL | 2 refills | Status: DC | PRN

## 2021-12-13 NOTE — Telephone Encounter (Signed)
Can you please let patient know that I am just now seeing this message as I have been on vacation since 6/24.  As far as the tramadol, I just wrote for him to take 4 times/day in hopes that insurance will allow a larger quantity.  As far as the MRI, I assume the referral was placed the day I saw him in the office.  Can we check on this?

## 2021-12-14 NOTE — Telephone Encounter (Signed)
Tried to call patient. No answer. LMOM.

## 2021-12-22 ENCOUNTER — Encounter: Payer: Self-pay | Admitting: Pharmacist

## 2021-12-22 ENCOUNTER — Ambulatory Visit: Payer: Medicare PPO | Attending: Internal Medicine | Admitting: Pharmacist

## 2021-12-22 VITALS — Wt 244.8 lb

## 2021-12-22 DIAGNOSIS — Z23 Encounter for immunization: Secondary | ICD-10-CM | POA: Diagnosis not present

## 2021-12-22 DIAGNOSIS — Z0000 Encounter for general adult medical examination without abnormal findings: Secondary | ICD-10-CM | POA: Diagnosis not present

## 2021-12-22 MED ORDER — ZOSTER VAC RECOMB ADJUVANTED 50 MCG/0.5ML IM SUSR: 0.5000 mL | Freq: Once | INTRAMUSCULAR | 0 refills | Status: AC

## 2021-12-22 NOTE — Progress Notes (Signed)
Shower chair - step through shower  Nocturia - requests Urology  MRI - R shoulder (Ortho MD rec MRI) - needs through [facility]  [facility] Rx sent for second.  Req Urology referral.  Subjective:   Michael [name] is a 61 y.o. male who presents for Medicare Annual/Subsequent preventive examination.     Objective:    Today's Vitals   12/22/21 1050 12/22/21 1055  Weight: 244 lb 12.8 oz (111 kg)   PainSc: 7  7    Body mass index is 36.15 kg/m.     12/22/2021   11:06 AM 06/26/2021    1:32 PM 12/29/2020    1:10 PM 03/09/2018   10:34 AM 01/27/2018    6:38 PM 01/27/2018    2:00 PM 03/23/2017    1:46 PM  Advanced Directives  Does Patient Have a Medical Advance Directive? No No No No No No No  Would patient like information on creating a medical advance directive? No - Patient declined Yes (Inpatient - patient defers creating a medical advance directive at this time - Information given) No - Patient declined  No - Patient declined No - Patient declined No - Patient declined    Current Medications (verified) Outpatient Encounter Medications as of 12/22/2021  Medication Sig   amLODipine (NORVASC) 5 MG tablet Take 1 tablet (5 mg total) by mouth daily.   aspirin EC 81 MG tablet Take 1 tablet (81 mg total) by mouth daily.   gabapentin (NEURONTIN) 300 MG capsule TAKE 1 CAPSULE BY MOUTH THREE TIMES DAILY   latanoprost (XALATAN) 0.005 % ophthalmic solution    lisinopril (ZESTRIL) 40 MG tablet Take 1 tablet (40 mg total) by mouth daily.   methocarbamol (ROBAXIN) 500 MG tablet Take 1 tablet (500 mg total) by mouth 2 (two) times daily as needed for muscle spasms.   metoprolol tartrate (LOPRESSOR) 25 MG tablet Take 1 tablet (25 mg total) by mouth 2 (two) times daily.   nitroGLYCERIN (NITROSTAT) 0.4 MG SL tablet Place 1 tablet (0.4 mg total) under the tongue every 5 (five) minutes as needed for chest pain.   rosuvastatin (CRESTOR) 40 MG tablet Take 1 tablet by mouth once daily    tamsulosin (FLOMAX) 0.4 MG CAPS capsule Take 1 capsule by mouth at bedtime   traMADol (ULTRAM) 50 MG tablet Take 1 tablet (50 mg total) by mouth 4 (four) times daily as needed.   warfarin (COUMADIN) 5 MG tablet TAKE 1 TO 1 & 1/2 TABLETS BY MOUTH ONCE DAILY AS DIRECTED BY COUMADIN CLINIC   Zoster Vaccine Adjuvanted [facility](SHINGRIX) injection Inject 0.5 mLs into the muscle once for 1 dose.   silodosin (RAPAFLO) 8 MG CAPS capsule Take 8 mg by mouth at bedtime. (Patient not taking: Reported on 10/26/2021)   No facility-administered encounter medications on file as of 12/22/2021.    Allergies (verified) Ivp dye [iodinated contrast media]   History: Past Medical History:  Diagnosis Date   Alcohol abuse    Anterior myocardial infarction [facility](HCC)    ST-elevation; S/P emergent  drug-eluting stenting of proximal left anterior descending   Coronary artery disease    a. STEMI 2012 - s/p DES to LAD with  diffuse distal disease in the right posterolateral branch. The third OM was occluded and filled from left to left collaterals. LVEF preserved.   DVT complicating pregnancy    Dysuria 04/26/2013   Essential hypertension 05/16/2014   Frequent urination 09/03/2014   HNP (herniated nucleus pulposus), cervical 10/14/2014  Hyperlipidemia    Hypertension    Ischemia of extremity 01/23/2018   Lower abdominal pain 09/03/2014   PAD (peripheral artery disease) (HCC)    Pre-diabetes    Premature atrial contraction    PSVT (paroxysmal supraventricular tachycardia) (HCC)    2 short runs on monitor 06/2019   Pulmonary emboli (HCC)    PVC's (premature ventricular contractions)    Renal insufficiency    Special screening for malignant neoplasms, colon    Past Surgical History:  Procedure Laterality Date   COLONOSCOPY N/A 08/16/2014   Procedure: COLONOSCOPY;  Surgeon: West Bali, MD;  [facility];  Service: Endoscopy;  Laterality: N/A;  10:15 AM   DOPPLER ECHOCARDIOGRAPHY     Preserved left venticular  function   EMBOLECTOMY Right 01/23/2018   Procedure: RIGHT POPLITEAL-TIBIAL  ARTERY EMBOLECTOMY, EXPLORATION OF RIGHT ANTERIOR TIBIAL ARTERY, RIGHT POPLITEAL ARTERY VEIN ANGIOPLASTY;  Surgeon: Sherren Kerns, MD;  Location: [facility] OR;  Service: Vascular;  Laterality: Right;   ENDARTERECTOMY POPLITEAL Left 01/23/2018   Procedure: ENDARTERECTOMY OF LEFT POPLITEAL ARTERY AND TIBIAL-PERONEAL TRUNK;  Surgeon: Sherren Kerns, MD;  Location: [facility] OR;  Service: Vascular;  Laterality: Left;   FEMORAL-POPLITEAL BYPASS GRAFT Left 01/23/2018   Procedure: BYPASS GRAFT LEFT FEMORAL-POPLITEAL ARTERY WITH PROPATEN VASCULAR GRAFT;  Surgeon: Sherren Kerns, MD;  Location: [facility] OR;  Service: Vascular;  Laterality: Left;   HEMATOMA EVACUATION Left 01/25/2018   Procedure: EVACUATION HEMATOMA LEFT POPLITEAL SPACE;  Surgeon: Cephus Shelling, MD;  Location: [facility] OR;  Service: Vascular;  Laterality: Left;   LEG SURGERY     left leg - has rod and 4 pins   PATELLAR TENDON REPAIR Right 04/26/2014   Procedure: RIGHT PATELLA TENDON REPAIR;  Surgeon: Sheral Apley, MD;  Location: [facility];  Service: Orthopedics;  Laterality: Right;   Family History  Problem Relation Age of Onset   Cancer Maternal Aunt    Hyperlipidemia Other    Diabetes Other    Heart disease Other    Social History   Socioeconomic History   Marital status: Significant Other    Spouse name: Not on file   Number of children: 0   Years of education: Not on file   Highest education level: Not on file  Occupational History   Not on file  Tobacco Use   Smoking status: Never   Smokeless tobacco: Never  Substance and Sexual Activity   Alcohol use: Yes    Comment: Drinks occassionally   Drug use: No   Sexual activity: Not on file  Other Topics Concern   Not on file  Social History Narrative   Not on file   Social Determinants of Health   Financial Resource Strain: Not on file  Food Insecurity: Not on file  Transportation  Needs: Not on file  Physical Activity: Not on file  Stress: Not on file  Social Connections: Not on file    Tobacco Counseling Counseling given: Yes   Clinical Intake:  Pre-visit preparation completed: No  Pain : 0-10 Pain Score: 7  Pain Type: Chronic pain Pain Location: Leg Pain Orientation: Right, Left Pain Descriptors / Indicators: Aching, Constant Pain Onset: More than a month ago Pain Frequency: Constant Pain Relieving Factors: Tyelnol, Gabapentin  Pain Relieving Factors: Tyelnol, Gabapentin  BMI - recorded: 36.15 Nutritional Status: BMI > 30  Obese Diabetes: No  How often do you need to have someone help you when you read instructions, pamphlets, or other written materials  from your doctor or pharmacy?: 3 - Sometimes (Will have to have the pharmacy explain medication changes occasionally)  Diabetic? No  Interpreter Needed?: No   Activities of Daily Living    12/22/2021   11:11 AM 06/26/2021    1:33 PM  In your present state of health, do you have any difficulty performing the following activities:  Hearing? 0 0  Vision? 0 1  Comment Uses readers - sees [facility]   Difficulty concentrating or making decisions? 0 0  Walking or climbing stairs? 0 1  Dressing or bathing? 0 0  Doing errands, shopping? 0 0  Preparing Food and eating ? N N  Using the Toilet? N N  In the past six months, have you accidently leaked urine? N N  Do you have problems with loss of bowel control? N N  Managing your Medications? N N  Managing your Finances? N N  Housekeeping or managing your Housekeeping? N N    Patient Care Team: Marcine Matar, MD as PCP - General (Internal Medicine) Kathleene Hazel, MD as PCP - Cardiology (Cardiology)  Indicate any recent Medical Services you may have received from other than [facility] providers in the past year (date may be approximate).     Assessment:   This is a routine wellness examination for  [place].  Hearing/Vision screen No results found.  Dietary issues and exercise activities discussed: Current Exercise Habits: The patient does not participate in regular exercise at present, Exercise limited by: orthopedic condition(s)   Goals Addressed   None   Depression Screen    12/22/2021   11:11 AM 06/26/2021    1:33 PM 12/04/2020    8:57 AM 11/13/2020    8:42 AM 07/06/2017   11:23 AM 03/23/2017    1:48 PM 10/27/2016    2:59 PM  PHQ 2/9 Scores  PHQ - 2 Score 0 0 0 0 2 1 0  PHQ- 9 Score     8  5    Fall Risk    12/22/2021   11:10 AM 11/10/2021    2:56 PM 06/26/2021    1:32 PM 12/04/2020    8:57 AM 11/13/2020    8:42 AM  Fall Risk   Falls in the past year? 0 0 1 0 0  Number falls in past yr: 0 0 0 0 0  Injury with Fall? 0 0 0 0 0  Risk for fall due to : Impaired balance/gait;Orthopedic patient No Fall Risks  No Fall Risks No Fall Risks  Follow up Falls evaluation completed;Education provided;Falls prevention discussed  Education provided      FALL RISK PREVENTION PERTAINING TO THE HOME:  Any stairs in or around the home? Yes  If so, are there any without handrails? No  Home free of loose throw rugs in walkways, pet beds, electrical cords, etc? Yes  Adequate lighting in your home to reduce risk of falls? Yes   ASSISTIVE DEVICES UTILIZED TO PREVENT FALLS:  Life alert? No  Use of a cane, walker or w/c? Yes  Grab bars in the bathroom? No  Shower chair or bench in shower? No  Elevated toilet seat or a handicapped toilet? No   TIMED UP AND GO:  Was the test performed? Yes .  Length of time to ambulate 10 feet: 10 sec.   Gait slow and steady with assistive device  Cognitive Function:    12/22/2021   11:13 AM 06/26/2021    1:35 PM  MMSE - Mini Mental State Exam  Orientation to time 5 5  Orientation to Place 5 5  Registration 3 3  Attention/ Calculation 5 5  Recall 2 3  Language- name 2 objects 2 2  Language- repeat 1 1  Language- follow 3 step command 3 3   Language- read & follow direction 1 1  Write a sentence 1 1  Copy design 1 1  Total score 29 30        Immunizations Immunization History  Administered Date(s) Administered   Tdap 12/22/2021    TDAP status: Due, Education has been provided regarding the importance of this vaccine. Advised may receive this vaccine at local pharmacy or Health Dept. Aware to provide a copy of the vaccination record if obtained from local pharmacy or Health Dept. Verbalized acceptance and understanding.  Flu Vaccine status: Up to date  Pneumococcal vaccine status: Up to date  Covid-19 vaccine status: Declined, Education has been provided regarding the importance of this vaccine but patient still declined. Advised may receive this vaccine at local pharmacy or Health Dept.or vaccine clinic. Aware to provide a copy of the vaccination record if obtained from local pharmacy or Health Dept. Verbalized acceptance and understanding.  Qualifies for Shingles Vaccine? Yes   Rx for Shingrix sent to [facility].   Screening Tests Health Maintenance  Topic Date Due   COVID-19 Vaccine (1) Never done   Zoster Vaccines- Shingrix (1 of 2) Never done   INFLUENZA VACCINE  01/12/2022   COLONOSCOPY (Pts 45-80yrs Insurance coverage will need to be confirmed)  08/15/2024   TETANUS/TDAP  12/23/2031   Hepatitis C Screening  Completed   HIV Screening  Completed   HPV VACCINES  Aged Out    Health Maintenance  Health Maintenance Due  Topic Date Due   COVID-19 Vaccine (1) Never done   Zoster Vaccines- Shingrix (1 of 2) Never done    Colorectal cancer screening: Type of screening: Colonoscopy. Completed 08/16/14. Repeat every 10 years  Lung Cancer Screening: (Low Dose CT Chest recommended if Age 39-80 years, 30 pack-year currently smoking OR have quit w/in 15years.) does not qualify.   Lung Cancer Screening Referral: not needed  Additional Screening:  Hepatitis C Screening: does not qualify; Completed  03/23/2017  Vision Screening: Recommended annual ophthalmology exams for early detection of glaucoma and other disorders of the eye. Is the patient up to date with their annual eye exam?  Yes  Who is the provider or what is the name of the office in which the patient attends annual eye exams? Dr[facility] Clelia Croft at [facility]  Dental Screening: Recommended annual dental exams for proper oral hygiene  Community Resource Referral / Chronic Care Management: CRR required this visit?  No   CCM required this visit?  No      Plan:     I have personally reviewed and noted the following in the patient's chart:   Medical and social history Use of alcohol, tobacco or illicit drugs  Current medications and supplements including opioid prescriptions. Patient is currently taking opioid prescriptions. Information provided to patient regarding non-opioid alternatives. Patient advised to discuss non-opioid treatment plan with their provider. Functional ability and status Nutritional status Physical activity Advanced directives List of other physicians Hospitalizations, surgeries, and ER visits in previous 12 months Vitals Screenings to include cognitive, depression, and falls Referrals and appointments  In addition, I have reviewed and discussed with patient certain preventive protocols, quality metrics, and best practice recommendations. A written personalized  care plan for preventive services as well as general preventive health recommendations were provided to patient.  Drucilla Chalet, RPH-CPP   12/22/2021

## 2021-12-23 ENCOUNTER — Telehealth: Payer: Self-pay | Admitting: Internal Medicine

## 2021-12-23 DIAGNOSIS — M1711 Unilateral primary osteoarthritis, right knee: Secondary | ICD-10-CM

## 2021-12-23 DIAGNOSIS — R351 Nocturia: Secondary | ICD-10-CM

## 2021-12-23 NOTE — Telephone Encounter (Signed)
Prescription written for shower chair. Referral submitted to urology for nocturia.

## 2021-12-23 NOTE — Telephone Encounter (Signed)
-----   Message from Drucilla Chalet, RPH-CPP sent at 12/22/2021 12:36 PM EDT ----- [address] Dr. Laural Benes,   Saw this patient today for his AWV.   Completed the following:  -Gave Tdap.  -HM updated with Shingrix he received at [facility]. Rx for second dose given today.   He requests the following:  -Shower chair. Has chronic bilateral knee/lower extremity pain and lower back pain. Has to use a cane and feels unsteady in the shower.  -Urology referral. Had recent PSA nl. Endorses nocturia, claims to use the restroom >10x per night.   Other AWV-specific concerns addressed.   Thank you for including me!

## 2021-12-29 ENCOUNTER — Telehealth: Payer: Self-pay | Admitting: Emergency Medicine

## 2021-12-29 ENCOUNTER — Ambulatory Visit (INDEPENDENT_AMBULATORY_CARE_PROVIDER_SITE_OTHER): Payer: Medicare PPO | Admitting: *Deleted

## 2021-12-29 DIAGNOSIS — I2699 Other pulmonary embolism without acute cor pulmonale: Secondary | ICD-10-CM | POA: Diagnosis not present

## 2021-12-29 DIAGNOSIS — Z5181 Encounter for therapeutic drug level monitoring: Secondary | ICD-10-CM | POA: Diagnosis not present

## 2021-12-29 DIAGNOSIS — I824Y1 Acute embolism and thrombosis of unspecified deep veins of right proximal lower extremity: Secondary | ICD-10-CM

## 2021-12-29 LAB — POCT INR: INR: 2.9 (ref 2.0–3.0)

## 2021-12-29 NOTE — Telephone Encounter (Signed)
Copied from CRM [id number]. Topic: Appointment Scheduling - Scheduling Inquiry for Clinic >> Dec 29, 2021  3:56 PM Teressa P wrote: Reason for CRM: Pt said he was in last week for an appt with Franky Macho and Franky Macho told him that he would get him an appt for a MRI for his shoulder with [facility] but patient has ot heard anything back about this.  CB#  [phone]

## 2021-12-29 NOTE — Patient Instructions (Addendum)
Description   Continue taking Warfarin 1 tablet daily except for 2 tablets on Mondays, Wednesdays and Fridays. Recheck INR in 6 weeks. Keep dark green leafy veggies consistent in your diet at least 2-3 times in your diet (collards, mixed greens, cabbage, broccoli, or spinach). Coumadin Clinic [phone] or [phone].

## 2021-12-30 NOTE — Telephone Encounter (Signed)
Attempt to call patient to inform that Dr. Roda Shutters office has to schedule his MRI.   Call was answered and then disconnected.  Left message on voicemail to let patient know that Dr. Roda Shutters 's office would be the office to contact for the MRI appt. Since they placed the order, not [facility].   Advised to call office back for further questions.

## 2021-12-31 NOTE — Telephone Encounter (Signed)
Unable to reach. Call cannot be completed.

## 2022-01-01 ENCOUNTER — Other Ambulatory Visit: Payer: Self-pay | Admitting: Cardiovascular Disease

## 2022-01-01 DIAGNOSIS — I739 Peripheral vascular disease, unspecified: Secondary | ICD-10-CM

## 2022-01-05 NOTE — Telephone Encounter (Signed)
Unable to reach. "Recording states-call cannot be completed as dialed"

## 2022-01-27 ENCOUNTER — Other Ambulatory Visit: Payer: Self-pay | Admitting: Cardiovascular Disease

## 2022-01-27 DIAGNOSIS — I824Y1 Acute embolism and thrombosis of unspecified deep veins of right proximal lower extremity: Secondary | ICD-10-CM

## 2022-02-09 ENCOUNTER — Ambulatory Visit: Payer: Medicare PPO | Attending: Cardiovascular Disease

## 2022-02-09 DIAGNOSIS — I824Y1 Acute embolism and thrombosis of unspecified deep veins of right proximal lower extremity: Secondary | ICD-10-CM

## 2022-02-09 DIAGNOSIS — I2699 Other pulmonary embolism without acute cor pulmonale: Secondary | ICD-10-CM | POA: Diagnosis not present

## 2022-02-09 DIAGNOSIS — Z5181 Encounter for therapeutic drug level monitoring: Secondary | ICD-10-CM

## 2022-02-09 LAB — POCT INR: INR: 2.2 (ref 2.0–3.0)

## 2022-02-09 NOTE — Patient Instructions (Signed)
Continue taking Warfarin 1 tablet daily except for 2 tablets on Mondays, Wednesdays and Fridays. Recheck INR in 6 weeks. Keep dark green leafy veggies consistent in your diet at least 2-3 times in your diet (collards, mixed greens, cabbage, broccoli, or spinach). Coumadin Clinic [phone] or [phone].

## 2022-02-22 ENCOUNTER — Telehealth: Payer: Self-pay | Admitting: Internal Medicine

## 2022-02-22 NOTE — Telephone Encounter (Signed)
Marchelle Folks calling from [employer] is calling to see if the pt has received out regarding a durable medical equipment for power wheelchair needs?  Please advise  [phone] X [phone]

## 2022-02-22 NOTE — Telephone Encounter (Signed)
Routing to CMA 

## 2022-02-23 ENCOUNTER — Emergency Department (HOSPITAL_COMMUNITY)
Admission: EM | Admit: 2022-02-23 | Discharge: 2022-02-23 | Disposition: A | Discharge status: Home / Self Care | Payer: Medicare PPO | Attending: Emergency Medicine | Admitting: Emergency Medicine

## 2022-02-23 ENCOUNTER — Emergency Department (HOSPITAL_COMMUNITY): Payer: Medicare PPO

## 2022-02-23 ENCOUNTER — Encounter (HOSPITAL_COMMUNITY): Payer: Self-pay | Admitting: Emergency Medicine

## 2022-02-23 DIAGNOSIS — S61214A Laceration without foreign body of right ring finger without damage to nail, initial encounter: Secondary | ICD-10-CM | POA: Diagnosis not present

## 2022-02-23 DIAGNOSIS — W540XXA Bitten by dog, initial encounter: Secondary | ICD-10-CM | POA: Insufficient documentation

## 2022-02-23 DIAGNOSIS — Z7901 Long term (current) use of anticoagulants: Secondary | ICD-10-CM | POA: Diagnosis not present

## 2022-02-23 DIAGNOSIS — Z7982 Long term (current) use of aspirin: Secondary | ICD-10-CM | POA: Insufficient documentation

## 2022-02-23 DIAGNOSIS — Y92810 Car as the place of occurrence of the external cause: Secondary | ICD-10-CM | POA: Insufficient documentation

## 2022-02-23 DIAGNOSIS — S61451A Open bite of right hand, initial encounter: Secondary | ICD-10-CM | POA: Diagnosis not present

## 2022-02-23 DIAGNOSIS — M7989 Other specified soft tissue disorders: Secondary | ICD-10-CM | POA: Diagnosis not present

## 2022-02-23 DIAGNOSIS — S6991XA Unspecified injury of right wrist, hand and finger(s), initial encounter: Secondary | ICD-10-CM | POA: Diagnosis present

## 2022-02-23 MED ORDER — AMOXICILLIN-POT CLAVULANATE 875-125 MG PO TABS
1.0000 | ORAL_TABLET | Freq: Once | ORAL | Status: AC
  Administered 2022-02-23: 1 via ORAL
  Filled 2022-02-23: qty 1

## 2022-02-23 MED ORDER — AMOXICILLIN-POT CLAVULANATE 875-125 MG PO TABS: 1.0000 | ORAL_TABLET | Freq: Two times a day (BID) | ORAL | 0 refills | Status: DC

## 2022-02-23 NOTE — ED Provider Notes (Signed)
[facility] EMERGENCY DEPARTMENT Provider Note   CSN: [id number] Arrival date & time: 02/23/22  1131     History  Chief Complaint  Patient presents with   Animal Bite    Michael [name] is a 61 y.o. male.  61 year old male with prior medical history as detailed below presents for evaluation.  Patient reports that 3 days ago he was helping a friend who was in the car.  The friend's dog was in the car.  The dog bit the patient on the right hand.  This bite occurred 3 days prior to evaluation today.  Patient is concerned about possible infection.  The patient's tetanus is up-to-date.  The patient is unsure as to the rabies status of the dog.  The dog is reportedly a longstanding pet of the patient's friend.  Patient denies other injury.  The history is provided by the patient and medical records.       Home Medications Prior to Admission medications   Medication Sig Start Date End Date Taking? Authorizing Provider  warfarin (COUMADIN) 5 MG tablet TAKE 1 TO 2  TABLETS BY MOUTH ONCE DAILY AS DIRECTED BY COUMADIN  CLINIC 01/27/22   Kathleene Hazel, MD  amLODipine (NORVASC) 5 MG tablet Take 1 tablet (5 mg total) by mouth daily. 11/10/21   Marcine Matar, MD  aspirin EC 81 MG tablet Take 1 tablet (81 mg total) by mouth daily. 06/21/19   Kathleene Hazel, MD  gabapentin (NEURONTIN) 300 MG capsule TAKE 1 CAPSULE BY MOUTH THREE TIMES DAILY 11/17/21   Marcine Matar, MD  latanoprost (XALATAN) 0.005 % ophthalmic solution  09/19/20   [provider]  lisinopril (ZESTRIL) 40 MG tablet Take 1 tablet (40 mg total) by mouth daily. 10/26/21   Kathleene Hazel, MD  methocarbamol (ROBAXIN) 500 MG tablet Take 1 tablet (500 mg total) by mouth 2 (two) times daily as needed for muscle spasms. 11/19/21   Cristie Hem, PA-C  metoprolol tartrate (LOPRESSOR) 25 MG tablet Take 1 tablet (25 mg total) by mouth 2 (two) times daily. 10/26/21   Kathleene Hazel, MD  nitroGLYCERIN (NITROSTAT) 0.4 MG SL tablet Place 1 tablet (0.4 mg total) under the tongue every 5 (five) minutes as needed for chest pain. 07/28/20   Kathleene Hazel, MD  rosuvastatin (CRESTOR) 40 MG tablet Take 1 tablet by mouth once daily 01/01/22   Kathleene Hazel, MD  silodosin (RAPAFLO) 8 MG CAPS capsule Take 8 mg by mouth at bedtime. Patient not taking: Reported on 10/26/2021 01/27/21   [provider]  tamsulosin (FLOMAX) 0.4 MG CAPS capsule Take 1 capsule by mouth at bedtime 12/09/21   Marcine Matar, MD  traMADol (ULTRAM) 50 MG tablet Take 1 tablet (50 mg total) by mouth 4 (four) times daily as needed. 12/13/21   Cristie Hem, PA-C      Allergies    Ivp dye [iodinated contrast media]    Review of Systems   Review of Systems  All other systems reviewed and are negative.   Physical Exam Updated Vital Signs BP 132/88   Pulse 75   Temp 98.4 F (36.9 C) (Oral)   Resp 16   SpO2 97%  Physical Exam Vitals and nursing note reviewed.  Constitutional:      General: He is not in acute distress.    Appearance: Normal appearance. He is well-developed.  HENT:     Head: Normocephalic and atraumatic.  Eyes:  Conjunctiva/sclera: Conjunctivae normal.     Pupils: Pupils are equal, round, and reactive to light.  Cardiovascular:     Rate and Rhythm: Normal rate and regular rhythm.     Heart sounds: Normal heart sounds.  Pulmonary:     Effort: Pulmonary effort is normal. No respiratory distress.     Breath sounds: Normal breath sounds.  Abdominal:     General: There is no distension.     Palpations: Abdomen is soft.     Tenderness: There is no abdominal tenderness.  Musculoskeletal:        General: No deformity. Normal range of motion.     Cervical back: Normal range of motion and neck supple.  Skin:    General: Skin is warm and dry.     Comments: Superficial laceration at the base of the right fourth finger.  No significant erythema  or drainage noted.  Full active range of motion of the hand and fingers.  Distal sensation in all fingers is intact.  Neurological:     General: No focal deficit present.     Mental Status: He is alert and oriented to person, place, and time.     ED Results / Procedures / Treatments   Labs (all labs ordered are listed, but only abnormal results are displayed) Labs Reviewed - No data to display  EKG None  Radiology DG Hand Complete Right  Result Date: 02/23/2022 CLINICAL DATA:  Dog bite. Dog bite to the right hand that occurred 3 days ago. EXAM: RIGHT HAND - COMPLETE 3+ VIEW COMPARISON:  None Available. FINDINGS: There is no evidence of fracture or dislocation. Hooked osteophytes about the head of the third metacarpal. There is no radiopaque foreign body. Soft tissue swelling about the proximal phalanx of the fourth digit. IMPRESSION: 1. No acute osseous abnormality. 2. Soft tissue swelling about the proximal phalanx of the fourth digit. Radiopaque foreign body. Electronically Signed   By: Larose Hires D.O.   On: 02/23/2022 12:35    Procedures Procedures    Medications Ordered in ED Medications  amoxicillin-clavulanate (AUGMENTIN) 875-125 MG per tablet 1 tablet (has no administration in time range)    ED Course/ Medical Decision Making/ A&P                           Medical Decision Making Risk Prescription drug management.    Medical Screen Complete  This patient presented to the ED with complaint of dog bite.  This complaint involves an extensive number of treatment options. The initial differential diagnosis includes, but is not limited to, injury from dog bite  This presentation is: Acute, Self-Limited, Previously Undiagnosed, and Uncertain Prognosis  Patient is presenting 3 days after dog bite to right hand.  Patient's tetanus is up-to-date.  Patient is unsure as to the dog's rabies status.  The dog is owned by a friend of his.  Patient's wound is without clear  evidence of infection.  However, given dog bite will prescribe antibiotics.  Patient instructed on how to care for his wound.  Patient is going to follow-up with animal control and also with his friend to determine rabies status of dog.  Patient educated as to  indications for rabies.  Additional history obtained:  External records from outside sources obtained and reviewed including prior ED visits and prior Inpatient records.   Imaging Studies ordered:  I ordered imaging studies including plain films of right hand I independently visualized and interpreted  obtained imaging which showed NAD I agree with the radiologist interpretation.  Medicines ordered:  I ordered medication including Augmentin for animal bite Reevaluation of the patient after these medicines showed that the patient: improved   Problem List / ED Course:  Animal bite   Reevaluation:  After the interventions noted above, I reevaluated the patient and found that they have: improved  Disposition:  After consideration of the diagnostic results and the patients response to treatment, I feel that the patent would benefit from close outpatient follow-up.          Final Clinical Impression(s) / ED Diagnoses Final diagnoses:  Dog bite, initial encounter    Rx / DC Orders ED Discharge Orders     None         Wynetta Fines, MD 02/23/22 1413

## 2022-02-23 NOTE — Discharge Instructions (Addendum)
Return for any problem.  ?

## 2022-02-23 NOTE — ED Provider Triage Note (Signed)
Emergency Medicine Provider Triage Evaluation Note  Michael [name] , a 61 y.o. male  was evaluated in triage.  Pt complains of dog bite to the right hand three days ago. Unknown if up to date on shots for the dog. The patient has been cleaning it with peroxide. Unknown when last updated  Review of Systems  Positive:  Negative:   Physical Exam  BP 132/88   Pulse 75   Temp 98.4 F (36.9 C) (Oral)   Resp 16   SpO2 97%  Gen:   Awake, no distress   Resp:  Normal effort  MSK:   Moves extremities without difficulty  Other:  Bandage in place  Medical Decision Making  Medically screening exam initiated at 12:01 PM.  Appropriate orders placed.  Michael [name] was informed that the remainder of the evaluation will be completed by another provider, this initial triage assessment does not replace that evaluation, and the importance of remaining in the ED until their evaluation is complete.  XR ordered   Achille Rich, PA-C 02/23/22 1203

## 2022-02-23 NOTE — ED Triage Notes (Signed)
Patient here with complaint of dog bite on right hand that occurred three days ago. Dog is owned by a friend of his, has not had wound evaluated prior to today and was encouraged to do so by his daughter. Patient is alert, oriented, ambulatory, and in no apparent distress at this time.

## 2022-02-24 ENCOUNTER — Emergency Department (HOSPITAL_COMMUNITY)
Admission: EM | Admit: 2022-02-24 | Discharge: 2022-02-24 | Disposition: A | Discharge status: Home / Self Care | Payer: Medicare PPO | Attending: Emergency Medicine | Admitting: Emergency Medicine

## 2022-02-24 ENCOUNTER — Encounter (HOSPITAL_COMMUNITY): Payer: Self-pay

## 2022-02-24 ENCOUNTER — Other Ambulatory Visit: Payer: Self-pay

## 2022-02-24 DIAGNOSIS — Z203 Contact with and (suspected) exposure to rabies: Secondary | ICD-10-CM | POA: Diagnosis not present

## 2022-02-24 DIAGNOSIS — S61254D Open bite of right ring finger without damage to nail, subsequent encounter: Secondary | ICD-10-CM | POA: Insufficient documentation

## 2022-02-24 DIAGNOSIS — W540XXD Bitten by dog, subsequent encounter: Secondary | ICD-10-CM | POA: Insufficient documentation

## 2022-02-24 DIAGNOSIS — Z23 Encounter for immunization: Secondary | ICD-10-CM | POA: Diagnosis not present

## 2022-02-24 DIAGNOSIS — Z2914 Encounter for prophylactic rabies immune globin: Secondary | ICD-10-CM | POA: Diagnosis not present

## 2022-02-24 DIAGNOSIS — Z7982 Long term (current) use of aspirin: Secondary | ICD-10-CM | POA: Diagnosis not present

## 2022-02-24 MED ORDER — RABIES VACCINE, PCEC IM SUSR
1.0000 mL | Freq: Once | INTRAMUSCULAR | Status: AC
  Administered 2022-02-24: 1 mL via INTRAMUSCULAR
  Filled 2022-02-24: qty 1

## 2022-02-24 NOTE — Telephone Encounter (Signed)
Please advise.----DD,RMA  

## 2022-02-24 NOTE — ED Provider Notes (Signed)
[facility] EMERGENCY DEPARTMENT Provider Note   CSN: [id number] Arrival date & time: 02/24/22  1051     History  Chief Complaint  Patient presents with   Animal Bite    Michael [name] is a 61 y.o. male.   Animal Bite    Patient presents due to dog bite x4 days ago.  This happened while hugging a friend from a car, a dog which is unvaccinated but his right hand and punctured the skin at the fourth digit.  He is not having any pain with movement, denies any fevers, purulent drainage, difficulty moving it.  He is taking Augmentin which was prescribed yesterday and has not missed any doses.  He states his tetanus is up-to-date, here because the dog was not up-to-date on its rabies and he is requesting rabies vaccination.  Home Medications Prior to Admission medications   Medication Sig Start Date End Date Taking? Authorizing Provider  warfarin (COUMADIN) 5 MG tablet TAKE 1 TO 2  TABLETS BY MOUTH ONCE DAILY AS DIRECTED BY COUMADIN  CLINIC 01/27/22   Kathleene Hazel, MD  amLODipine (NORVASC) 5 MG tablet Take 1 tablet (5 mg total) by mouth daily. 11/10/21   Marcine Matar, MD  amoxicillin-clavulanate (AUGMENTIN) 875-125 MG tablet Take 1 tablet by mouth every 12 (twelve) hours. 02/23/22   Wynetta Fines, MD  aspirin EC 81 MG tablet Take 1 tablet (81 mg total) by mouth daily. 06/21/19   Kathleene Hazel, MD  gabapentin (NEURONTIN) 300 MG capsule TAKE 1 CAPSULE BY MOUTH THREE TIMES DAILY 11/17/21   Marcine Matar, MD  latanoprost (XALATAN) 0.005 % ophthalmic solution  09/19/20   [provider]  lisinopril (ZESTRIL) 40 MG tablet Take 1 tablet (40 mg total) by mouth daily. 10/26/21   Kathleene Hazel, MD  methocarbamol (ROBAXIN) 500 MG tablet Take 1 tablet (500 mg total) by mouth 2 (two) times daily as needed for muscle spasms. 11/19/21   Cristie Hem, PA-C  metoprolol tartrate (LOPRESSOR) 25 MG tablet Take 1 tablet (25 mg total) by  mouth 2 (two) times daily. 10/26/21   Kathleene Hazel, MD  nitroGLYCERIN (NITROSTAT) 0.4 MG SL tablet Place 1 tablet (0.4 mg total) under the tongue every 5 (five) minutes as needed for chest pain. 07/28/20   Kathleene Hazel, MD  rosuvastatin (CRESTOR) 40 MG tablet Take 1 tablet by mouth once daily 01/01/22   Kathleene Hazel, MD  silodosin (RAPAFLO) 8 MG CAPS capsule Take 8 mg by mouth at bedtime. Patient not taking: Reported on 10/26/2021 01/27/21   [provider]  tamsulosin (FLOMAX) 0.4 MG CAPS capsule Take 1 capsule by mouth at bedtime 12/09/21   Marcine Matar, MD  traMADol (ULTRAM) 50 MG tablet Take 1 tablet (50 mg total) by mouth 4 (four) times daily as needed. 12/13/21   Cristie Hem, PA-C      Allergies    Ivp dye [iodinated contrast media]    Review of Systems   Review of Systems  Musculoskeletal:  Positive for myalgias.    Physical Exam Updated Vital Signs BP 134/75 (BP Location: Left Arm)   Pulse 62   Temp 98.4 F (36.9 C) (Oral)   Resp 20   Ht 5\' 9"  (1.753 m)   Wt 110.7 kg   SpO2 97%   BMI 36.03 kg/m  Physical Exam Vitals and nursing note reviewed. Exam conducted with a chaperone present.  Constitutional:  General: He is not in acute distress.    Appearance: Normal appearance.  HENT:     Head: Normocephalic and atraumatic.  Eyes:     General: No scleral icterus.    Extraocular Movements: Extraocular movements intact.     Pupils: Pupils are equal, round, and reactive to light.  Cardiovascular:     Pulses: Normal pulses.  Musculoskeletal:        General: Tenderness present.  Skin:    Capillary Refill: Capillary refill takes less than 2 seconds.     Coloration: Skin is not jaundiced.  Neurological:     Mental Status: He is alert. Mental status is at baseline.     Coordination: Coordination normal.      ED Results / Procedures / Treatments   Labs (all labs ordered are listed, but only abnormal results are  displayed) Labs Reviewed - No data to display  EKG None  Radiology DG Hand Complete Right  Result Date: 02/23/2022 CLINICAL DATA:  Dog bite. Dog bite to the right hand that occurred 3 days ago. EXAM: RIGHT HAND - COMPLETE 3+ VIEW COMPARISON:  None Available. FINDINGS: There is no evidence of fracture or dislocation. Hooked osteophytes about the head of the third metacarpal. There is no radiopaque foreign body. Soft tissue swelling about the proximal phalanx of the fourth digit. IMPRESSION: 1. No acute osseous abnormality. 2. Soft tissue swelling about the proximal phalanx of the fourth digit. Radiopaque foreign body. Electronically Signed   By: Larose Hires D.O.   On: 02/23/2022 12:35    Procedures Procedures    Medications Ordered in ED Medications  rabies vaccine (RABAVERT) injection 1 mL (1 mL Intramuscular Given 02/24/22 1236)    ED Course/ Medical Decision Making/ A&P                           Medical Decision Making Risk Prescription drug management.   Patient presents due to dog bite to right hand.  Patient is neurovascular intact with brisk cap refill and radial pulses 2+.  He has complete ROM at the DIP, PCP.  Sensation is circumferentially intact.  There is a laceration at the base of the 4th finger see the photo of the ED course.  There is no purulence, no surrounding erythema or warmth.  I do not think there is cellulitis, no sign of flexor tenosynovitis.  We will provide rabies vaccination series, encouraged continuing the Augmentin.  Strict return precautions were discussed with the patient who verbalized understanding.  Patient discharged stable condition.        Final Clinical Impression(s) / ED Diagnoses Final diagnoses:  Dog bite, subsequent encounter  Need for rabies vaccination    Rx / DC Orders ED Discharge Orders     None         Theron Arista, Cordelia Poche 02/24/22 1241    Wynetta Fines, MD 02/24/22 1505

## 2022-02-24 NOTE — Discharge Instructions (Addendum)
                                  RABIES VACCINE FOLLOW UP  Patient's Name: Michael [name]                     Original Order Date:02/24/2022  Medical Record Number: [id number]  ED Physician: Wynetta Fines, MD Primary Diagnosis: Rabies Exposure       PCP: Marcine Matar, MD  Patient Phone Number: (home) [phone] (home)    (cell)  Telephone Information:  Mobile [phone]    (work) There is no work phone number on file. Species of Animal:     You have been seen in the Emergency Department for a possible rabies exposure. It's very important you return for the additional vaccine doses.  Please call the clinic listed below for hours of operation.   Clinic that will administer your rabies vaccines:    DAY 0:  02/24/2022      DAY 3:  02/27/2022       DAY 7:  03/03/2022     DAY 14:  03/10/2022         The 5th vaccine injection is considered for immune compromised patients only.  DAY 28:  03/24/2022

## 2022-02-24 NOTE — ED Provider Triage Note (Signed)
Emergency Medicine Provider Triage Evaluation Note  Michael [name] , a 61 y.o. male  was evaluated in triage.  Pt complains of dog bite yesterday.  Patient found out that his friend struck is not up-to-date on vaccinations for rabies.  Requesting vaccination.  No other complaints at this time  Review of Systems  Positive: Dog bite to finger Negative: Fever  Physical Exam  BP 134/75 (BP Location: Left Arm)   Pulse 62   Temp 98.4 F (36.9 C) (Oral)   Resp 20   Ht 5\' 9"  (1.753 m)   Wt 110.7 kg   SpO2 97%   BMI 36.03 kg/m  Gen:   Awake, no distress   Resp:  Normal effort  MSK:   Moves extremities without difficulty  Other:  Dog bite wrapped  Medical Decision Making  Medically screening exam initiated at 11:17 AM.  Appropriate orders placed.  Michael [name] was informed that the remainder of the evaluation will be completed by another provider, this initial triage assessment does not replace that evaluation, and the importance of remaining in the ED until their evaluation is complete.  Work-up initiated   [name], PA-C 02/24/22 1118

## 2022-02-24 NOTE — ED Triage Notes (Signed)
Patient here yesterday treated for dog bite.  Back because dog doesn't have rabies shot

## 2022-02-26 NOTE — Telephone Encounter (Signed)
Call placed to patient and message states that call can not be completed at this time.  Pt needs to schedule an appointment with PCP to discuss wheelchair need.

## 2022-02-27 ENCOUNTER — Encounter (HOSPITAL_COMMUNITY): Payer: Self-pay | Admitting: Emergency Medicine

## 2022-02-27 ENCOUNTER — Emergency Department (HOSPITAL_COMMUNITY)
Admission: EM | Admit: 2022-02-27 | Discharge: 2022-02-27 | Disposition: A | Discharge status: Home / Self Care | Payer: Medicare PPO | Attending: Emergency Medicine | Admitting: Emergency Medicine

## 2022-02-27 ENCOUNTER — Other Ambulatory Visit: Payer: Self-pay

## 2022-02-27 DIAGNOSIS — Z23 Encounter for immunization: Secondary | ICD-10-CM | POA: Insufficient documentation

## 2022-02-27 DIAGNOSIS — Z2914 Encounter for prophylactic rabies immune globin: Secondary | ICD-10-CM | POA: Diagnosis not present

## 2022-02-27 DIAGNOSIS — Z203 Contact with and (suspected) exposure to rabies: Secondary | ICD-10-CM | POA: Insufficient documentation

## 2022-02-27 DIAGNOSIS — S61439D Puncture wound without foreign body of unspecified hand, subsequent encounter: Secondary | ICD-10-CM | POA: Diagnosis not present

## 2022-02-27 DIAGNOSIS — Z7901 Long term (current) use of anticoagulants: Secondary | ICD-10-CM | POA: Diagnosis not present

## 2022-02-27 DIAGNOSIS — W540XXD Bitten by dog, subsequent encounter: Secondary | ICD-10-CM | POA: Diagnosis not present

## 2022-02-27 MED ORDER — RABIES VACCINE, PCEC IM SUSR
1.0000 mL | Freq: Once | INTRAMUSCULAR | Status: AC
  Administered 2022-02-27: 1 mL via INTRAMUSCULAR
  Filled 2022-02-27 (×2): qty 1

## 2022-02-27 NOTE — ED Provider Notes (Signed)
[facility] EMERGENCY DEPARTMENT Provider Note   CSN: [id number] Arrival date & time: 02/27/22  1020     History  Chief Complaint  Patient presents with   Rabies Injection    Michael [name] is a 61 y.o. male who presents after a dog bite on 02/23/2022 and return for rabies prophylaxis on 02/24/2022, who presents to the emergency department today for repeat rabies vaccine.  Patient reports that wound is healing appropriately, no signs of secondary infection, as his last vaccine was administered on the 13th he is on appropriate schedule for repeat vaccination at this time.  HPI     Home Medications Prior to Admission medications   Medication Sig Start Date End Date Taking? Authorizing Provider  warfarin (COUMADIN) 5 MG tablet TAKE 1 TO 2  TABLETS BY MOUTH ONCE DAILY AS DIRECTED BY COUMADIN  CLINIC 01/27/22   Kathleene Hazel, MD  amLODipine (NORVASC) 5 MG tablet Take 1 tablet (5 mg total) by mouth daily. 11/10/21   Marcine Matar, MD  amoxicillin-clavulanate (AUGMENTIN) 875-125 MG tablet Take 1 tablet by mouth every 12 (twelve) hours. 02/23/22   Wynetta Fines, MD  aspirin EC 81 MG tablet Take 1 tablet (81 mg total) by mouth daily. 06/21/19   Kathleene Hazel, MD  gabapentin (NEURONTIN) 300 MG capsule TAKE 1 CAPSULE BY MOUTH THREE TIMES DAILY 11/17/21   Marcine Matar, MD  latanoprost (XALATAN) 0.005 % ophthalmic solution  09/19/20   [provider]  lisinopril (ZESTRIL) 40 MG tablet Take 1 tablet (40 mg total) by mouth daily. 10/26/21   Kathleene Hazel, MD  methocarbamol (ROBAXIN) 500 MG tablet Take 1 tablet (500 mg total) by mouth 2 (two) times daily as needed for muscle spasms. 11/19/21   Cristie Hem, PA-C  metoprolol tartrate (LOPRESSOR) 25 MG tablet Take 1 tablet (25 mg total) by mouth 2 (two) times daily. 10/26/21   Kathleene Hazel, MD  nitroGLYCERIN (NITROSTAT) 0.4 MG SL tablet Place 1 tablet (0.4 mg total) under the  tongue every 5 (five) minutes as needed for chest pain. 07/28/20   Kathleene Hazel, MD  rosuvastatin (CRESTOR) 40 MG tablet Take 1 tablet by mouth once daily 01/01/22   Kathleene Hazel, MD  silodosin (RAPAFLO) 8 MG CAPS capsule Take 8 mg by mouth at bedtime. Patient not taking: Reported on 10/26/2021 01/27/21   [provider]  tamsulosin (FLOMAX) 0.4 MG CAPS capsule Take 1 capsule by mouth at bedtime 12/09/21   Marcine Matar, MD  traMADol (ULTRAM) 50 MG tablet Take 1 tablet (50 mg total) by mouth 4 (four) times daily as needed. 12/13/21   Cristie Hem, PA-C      Allergies    Ivp dye [iodinated contrast media]    Review of Systems   Review of Systems  Skin:  Positive for wound.  All other systems reviewed and are negative.   Physical Exam Updated Vital Signs BP 117/77   Pulse 70   Temp 97.9 F (36.6 C) (Oral)   Resp 16   SpO2 98%  Physical Exam Vitals and nursing note reviewed.  Constitutional:      General: He is not in acute distress.    Appearance: Normal appearance.  HENT:     Head: Normocephalic and atraumatic.  Eyes:     General:        Right eye: No discharge.        Left eye: No discharge.  Cardiovascular:  Rate and Rhythm: Normal rate and regular rhythm.  Pulmonary:     Effort: Pulmonary effort is normal. No respiratory distress.  Musculoskeletal:        General: No deformity.  Skin:    General: Skin is warm and dry.     Comments: Appropriately healing puncture wound in the 4th digit webspace  Neurological:     Mental Status: He is alert and oriented to person, place, and time.  Psychiatric:        Mood and Affect: Mood normal.        Behavior: Behavior normal.     ED Results / Procedures / Treatments   Labs (all labs ordered are listed, but only abnormal results are displayed) Labs Reviewed - No data to display  EKG None  Radiology No results found.  Procedures Procedures    Medications Ordered in  ED Medications  rabies vaccine (RABAVERT) injection 1 mL (1 mL Intramuscular Given 02/27/22 1356)    ED Course/ Medical Decision Making/ A&P                           Medical Decision Making Risk Prescription drug management.   Patient presents for rabies vaccination.  He is on appropriate schedule for repeat vaccination, and is 3 days since his first vaccination.  His wound is healing appropriately with no signs of secondary infection at this time.  Neurovascularly intact throughout.  Administered second rabies vaccine, encouraged continued wound care, patient discharged in stable condition at this time, encouraged to return on day 7, 14 for rabies vaccine.  Final Clinical Impression(s) / ED Diagnoses Final diagnoses:  Need for post exposure prophylaxis for rabies    Rx / DC Orders ED Discharge Orders     None         Anselmo Pickler, PA-C 02/27/22 1504    Varney Biles, MD 02/27/22 1956

## 2022-02-27 NOTE — ED Notes (Signed)
DC instructions reviewed with pt. PT verbalized understanding. PT DC °

## 2022-02-27 NOTE — Discharge Instructions (Signed)
Please return on the seventh day after your initial rabies vaccine for your third dose, your first dose will be on the 14th day after the original vaccine.  In the meantime continue to monitor your wound for any signs of infection and return to the emergency department if you are concerned that it is becoming infected.

## 2022-02-27 NOTE — ED Triage Notes (Signed)
Patient here for second rabies injection. Patient has no complaint at this time.

## 2022-03-02 ENCOUNTER — Telehealth: Payer: Self-pay

## 2022-03-02 NOTE — Telephone Encounter (Signed)
        Patient  visited [place] on 9/13   Telephone encounter attempt :  1st  Unable to leave a message    [place]  [phone] [address], [place], [place] [address] Phone: [phone] Email: [email or web address]

## 2022-03-03 ENCOUNTER — Ambulatory Visit (HOSPITAL_COMMUNITY)
Admission: EM | Admit: 2022-03-03 | Discharge: 2022-03-03 | Disposition: A | Discharge status: Home / Self Care | Payer: Medicare PPO | Attending: Internal Medicine | Admitting: Internal Medicine

## 2022-03-03 ENCOUNTER — Encounter (HOSPITAL_COMMUNITY): Payer: Self-pay

## 2022-03-03 DIAGNOSIS — Z203 Contact with and (suspected) exposure to rabies: Secondary | ICD-10-CM | POA: Diagnosis not present

## 2022-03-03 MED ORDER — RABIES VACCINE, PCEC IM SUSR
INTRAMUSCULAR | Status: AC
  Filled 2022-03-03: qty 1

## 2022-03-03 MED ORDER — RABIES VACCINE, PCEC IM SUSR
1.0000 mL | Freq: Once | INTRAMUSCULAR | Status: AC
  Administered 2022-03-03: 1 mL via INTRAMUSCULAR

## 2022-03-03 NOTE — ED Triage Notes (Signed)
Pt is here for rabies vaccine #3 pt tolerated vaccine given in [place]

## 2022-03-04 ENCOUNTER — Telehealth: Payer: Self-pay

## 2022-03-04 NOTE — Telephone Encounter (Signed)
        Patient  visited [place] on 9/13    Telephone encounter attempt :  2nd  A HIPAA compliant voice message was left requesting a return call.  Instructed patient to call back    [place], [facility]  [phone] [address], [place], [place] [address] Phone: [phone] Email: [email or web address]

## 2022-03-10 ENCOUNTER — Ambulatory Visit (HOSPITAL_COMMUNITY)
Admission: EM | Admit: 2022-03-10 | Discharge: 2022-03-10 | Disposition: A | Discharge status: Home / Self Care | Payer: Medicare PPO | Attending: Internal Medicine | Admitting: Internal Medicine

## 2022-03-10 ENCOUNTER — Encounter (HOSPITAL_COMMUNITY): Payer: Self-pay

## 2022-03-10 DIAGNOSIS — Z203 Contact with and (suspected) exposure to rabies: Secondary | ICD-10-CM | POA: Diagnosis not present

## 2022-03-10 MED ORDER — RABIES VACCINE, PCEC IM SUSR
INTRAMUSCULAR | Status: AC
  Filled 2022-03-10: qty 1

## 2022-03-10 MED ORDER — RABIES VACCINE, PCEC IM SUSR
1.0000 mL | Freq: Once | INTRAMUSCULAR | Status: AC
  Administered 2022-03-10: 1 mL via INTRAMUSCULAR

## 2022-03-10 NOTE — ED Notes (Signed)
Pt received 4 rabies vaccine. Pt tolerated vaccine well . Vaccine given in the R deltoid

## 2022-03-23 ENCOUNTER — Ambulatory Visit: Payer: Medicare PPO | Attending: Cardiovascular Disease

## 2022-05-23 ENCOUNTER — Other Ambulatory Visit: Payer: Self-pay | Admitting: Cardiovascular Disease

## 2022-05-23 DIAGNOSIS — I824Y1 Acute embolism and thrombosis of unspecified deep veins of right proximal lower extremity: Secondary | ICD-10-CM

## 2022-05-24 NOTE — Telephone Encounter (Addendum)
Pt last seen for INR monitoring on 02/09/2022-PT NEEDS AN APPT Last OV 10/26/21  Called pt and there was no answer. Will try back later. Called again and the message states the call cannot be completed as dialed. The pt cannot be reached will deny refill as pt needs an appt.

## 2022-05-25 ENCOUNTER — Other Ambulatory Visit: Payer: Self-pay | Admitting: Internal Medicine

## 2022-05-25 DIAGNOSIS — I10 Essential (primary) hypertension: Secondary | ICD-10-CM

## 2022-05-25 MED ORDER — AMLODIPINE BESYLATE 5 MG PO TABS: 5.0000 mg | ORAL_TABLET | Freq: Every day | ORAL | 0 refills | Status: DC

## 2022-05-25 NOTE — Telephone Encounter (Signed)
Requested Prescriptions  Pending Prescriptions Disp Refills   amLODipine (NORVASC) 5 MG tablet 90 tablet 0    Sig: Take 1 tablet (5 mg total) by mouth daily.     Cardiovascular: Calcium Channel Blockers 2 Failed - 05/25/2022 10:01 AM      Failed - Last BP in normal range    BP Readings from Last 1 Encounters:  03/10/22 (!) 149/86         Passed - Last Heart Rate in normal range    Pulse Readings from Last 1 Encounters:  03/10/22 65         Passed - Valid encounter within last 6 months    Recent Outpatient Visits           6 months ago Essential hypertension   [place] [place], Gavin Pound B, MD   10 months ago Chronic low back pain, unspecified back pain laterality, unspecified whether sciatica present   [facility] Marcine Matar, MD   11 months ago Encounter for [employer] annual wellness exam   [facility] [place], [place], [place]   1 year ago Chronic left-sided lumbar radiculopathy   [facility] [place], Shea Stakes, NP   1 year ago Chronic left-sided lumbar radiculopathy   [facility] Marcine Matar, MD       Future Appointments             In 3 weeks Sharon Seller, [place] [facility]

## 2022-05-25 NOTE — Telephone Encounter (Signed)
Medication Refill - Medication:  amLODipine (NORVASC) 5 MG tablet [[id number]]   Has the patient contacted their pharmacy? Yes.   (Agent: If no, request that the patient contact the pharmacy for the refill. If patient does not wish to contact the pharmacy document the reason why and proceed with request.) (Agent: If yes, when and what did the pharmacy advise?) be reaching out to office with no response   Preferred Pharmacy (with phone number or street name): [facility] - [place], [place] - [address]  Has the patient been seen for an appointment in the last year OR does the patient have an upcoming appointment? Yes.    Agent: Please be advised that RX refills may take up to 3 business days. We ask that you follow-up with your pharmacy.

## 2022-05-28 ENCOUNTER — Ambulatory Visit (HOSPITAL_COMMUNITY)
Admission: EM | Admit: 2022-05-28 | Discharge: 2022-05-28 | Disposition: A | Discharge status: Home / Self Care | Payer: Medicare PPO | Attending: Family Medicine | Admitting: Family Medicine

## 2022-05-28 ENCOUNTER — Encounter (HOSPITAL_COMMUNITY): Payer: Self-pay

## 2022-05-28 DIAGNOSIS — R300 Dysuria: Secondary | ICD-10-CM | POA: Diagnosis not present

## 2022-05-28 LAB — POCT URINALYSIS DIPSTICK, ED / UC
Bilirubin Urine: NEGATIVE
Glucose, UA: NEGATIVE mg/dL
Ketones, ur: NEGATIVE mg/dL
Leukocytes,Ua: NEGATIVE
Nitrite: NEGATIVE
Protein, ur: NEGATIVE mg/dL
Specific Gravity, Urine: 1.025 (ref 1.005–1.030)
Urobilinogen, UA: 0.2 mg/dL (ref 0.0–1.0)
pH: 5.5 (ref 5.0–8.0)

## 2022-05-28 NOTE — ED Provider Notes (Signed)
[facility]    CSN: [id number] Arrival date & time: 05/28/22  1042      History   Chief Complaint Chief Complaint  Patient presents with   Dysuria    HPI Michael [name] is a 61 y.o. male.   Patient is here for painful urination for about a week.  No penile d/c noted.  He has had unprotected intercourse.  No known exposure per se.  Mil back pain the other day, no abd pain.  No fevers/chills.  No h/o kidney stones He does take coumadin.  Previous urine samples in the past have been + blood.       Past Medical History:  Diagnosis Date   Alcohol abuse    Anterior myocardial infarction [facility])    ST-elevation; S/P emergent  drug-eluting stenting of proximal left anterior descending   Coronary artery disease    a. STEMI 2012 - s/p DES to LAD with  diffuse distal disease in the right posterolateral branch. The third OM was occluded and filled from left to left collaterals. LVEF preserved.   DVT complicating pregnancy    Dysuria 04/26/2013   Essential hypertension 05/16/2014   Frequent urination 09/03/2014   HNP (herniated nucleus pulposus), cervical 10/14/2014   Hyperlipidemia    Hypertension    Ischemia of extremity 01/23/2018   Lower abdominal pain 09/03/2014   PAD (peripheral artery disease) (HCC)    Pre-diabetes    Premature atrial contraction    PSVT (paroxysmal supraventricular tachycardia)    2 short runs on monitor 06/2019   Pulmonary emboli (HCC)    PVC's (premature ventricular contractions)    Renal insufficiency    Special screening for malignant neoplasms, colon     Patient Active Problem List   Diagnosis Date Noted   Prediabetes 12/04/2020   Obesity (BMI 30.0-34.9) 12/04/2020   DVT, lower extremity (HCC) 03/14/2019   History of pulmonary embolism 09/11/2018   Obsessive-compulsive disorder    Muscle cramps    Postoperative pain    Therapeutic opioid induced constipation    CKD (chronic kidney disease), stage II    Acute blood loss  anemia    Debility    Ischemia of extremity 01/23/2018   Frequency of urination and polyuria 07/06/2017   Erectile dysfunction due to diseases classified elsewhere 10/27/2016   PAD (peripheral artery disease) (HCC) 08/11/2016   Nocturia 08/11/2016   Chronic pain of both knees 04/01/2016   Palpitations 05/05/2015   Hyperlipidemia 05/05/2015   HNP (herniated nucleus pulposus), cervical 10/14/2014   Chronic low back pain 10/14/2014   Numbness and tingling of both upper extremities while sleeping 10/14/2014   Frequent urination 09/03/2014   Lower abdominal pain 09/03/2014   Cervical stenosis of spine 05/16/2014   Essential hypertension 05/16/2014   Numbness and tingling of right arm 11/28/2013   Dental abscess 04/26/2013   Dysuria 04/26/2013   HTN (hypertension) 04/04/2013   CAD (coronary artery disease) 05/25/2011    Past Surgical History:  Procedure Laterality Date   COLONOSCOPY N/A 08/16/2014   Procedure: COLONOSCOPY;  Surgeon: West Bali, MD;  [facility];  Service: Endoscopy;  Laterality: N/A;  10:15 AM   DOPPLER ECHOCARDIOGRAPHY     Preserved left venticular function   EMBOLECTOMY Right 01/23/2018   Procedure: RIGHT POPLITEAL-TIBIAL  ARTERY EMBOLECTOMY, EXPLORATION OF RIGHT ANTERIOR TIBIAL ARTERY, RIGHT POPLITEAL ARTERY VEIN ANGIOPLASTY;  Surgeon: Sherren Kerns, MD;  [facility] OR;  Service: Vascular;  Laterality: Right;   ENDARTERECTOMY POPLITEAL Left  01/23/2018   Procedure: ENDARTERECTOMY OF LEFT POPLITEAL ARTERY AND TIBIAL-PERONEAL TRUNK;  Surgeon: Sherren Kerns, MD;  Location: [facility] OR;  Service: Vascular;  Laterality: Left;   FEMORAL-POPLITEAL BYPASS GRAFT Left 01/23/2018   Procedure: BYPASS GRAFT LEFT FEMORAL-POPLITEAL ARTERY WITH PROPATEN VASCULAR GRAFT;  Surgeon: Sherren Kerns, MD;  Location: [facility] OR;  Service: Vascular;  Laterality: Left;   HEMATOMA EVACUATION Left 01/25/2018   Procedure: EVACUATION HEMATOMA LEFT POPLITEAL SPACE;  Surgeon: Cephus Shelling, MD;  [facility] OR;  Service: Vascular;  Laterality: Left;   LEG SURGERY     left leg - has rod and 4 pins   PATELLAR TENDON REPAIR Right 04/26/2014   Procedure: RIGHT PATELLA TENDON REPAIR;  Surgeon: Sheral Apley, MD;  Location: [facility];  Service: Orthopedics;  Laterality: Right;       Home Medications    Prior to Admission medications   Medication Sig Start Date End Date Taking? Authorizing Provider  warfarin (COUMADIN) 5 MG tablet TAKE 1 TO 2  TABLETS BY MOUTH ONCE DAILY AS DIRECTED BY COUMADIN  CLINIC 01/27/22   Kathleene Hazel, MD  amLODipine (NORVASC) 5 MG tablet Take 1 tablet (5 mg total) by mouth daily. 05/25/22   Marcine Matar, MD  amoxicillin-clavulanate (AUGMENTIN) 875-125 MG tablet Take 1 tablet by mouth every 12 (twelve) hours. Patient not taking: Reported on 05/28/2022 02/23/22   Wynetta Fines, MD  aspirin EC 81 MG tablet Take 1 tablet (81 mg total) by mouth daily. 06/21/19   Kathleene Hazel, MD  gabapentin (NEURONTIN) 300 MG capsule TAKE 1 CAPSULE BY MOUTH THREE TIMES DAILY 11/17/21   Marcine Matar, MD  latanoprost (XALATAN) 0.005 % ophthalmic solution  09/19/20   [provider]  lisinopril (ZESTRIL) 40 MG tablet Take 1 tablet (40 mg total) by mouth daily. 10/26/21   Kathleene Hazel, MD  methocarbamol (ROBAXIN) 500 MG tablet Take 1 tablet (500 mg total) by mouth 2 (two) times daily as needed for muscle spasms. 11/19/21   Cristie Hem, PA-C  metoprolol tartrate (LOPRESSOR) 25 MG tablet Take 1 tablet (25 mg total) by mouth 2 (two) times daily. 10/26/21   Kathleene Hazel, MD  nitroGLYCERIN (NITROSTAT) 0.4 MG SL tablet Place 1 tablet (0.4 mg total) under the tongue every 5 (five) minutes as needed for chest pain. 07/28/20   Kathleene Hazel, MD  rosuvastatin (CRESTOR) 40 MG tablet Take 1 tablet by mouth once daily 01/01/22   Kathleene Hazel, MD  silodosin (RAPAFLO) 8 MG CAPS capsule  Take 8 mg by mouth at bedtime. Patient not taking: Reported on 10/26/2021 01/27/21   [provider]  tamsulosin (FLOMAX) 0.4 MG CAPS capsule Take 1 capsule by mouth at bedtime 12/09/21   Marcine Matar, MD  traMADol (ULTRAM) 50 MG tablet Take 1 tablet (50 mg total) by mouth 4 (four) times daily as needed. 12/13/21   Cristie Hem, PA-C    Family History Family History  Problem Relation Age of Onset   Cancer Maternal Aunt    Hyperlipidemia Other    Diabetes Other    Heart disease Other     Social History Social History   Tobacco Use   Smoking status: Never   Smokeless tobacco: Never  Substance Use Topics   Alcohol use: Yes    Comment: Drinks occassionally   Drug use: No     Allergies   Ivp dye [iodinated contrast media]   Review  of Systems Review of Systems  Constitutional: Negative.   HENT: Negative.    Cardiovascular: Negative.   Gastrointestinal: Negative.   Genitourinary:  Positive for dysuria. Negative for difficulty urinating, penile discharge and urgency.  Musculoskeletal: Negative.   Psychiatric/Behavioral: Negative.       Physical Exam Triage Vital Signs ED Triage Vitals  Enc Vitals Group     BP 05/28/22 1120 (!) 146/76     Pulse Rate 05/28/22 1120 72     Resp 05/28/22 1120 14     Temp 05/28/22 1120 98.7 F (37.1 C)     Temp Source 05/28/22 1120 Oral     SpO2 05/28/22 1120 97 %     Weight --      Height --      Head Circumference --      Peak Flow --      Pain Score 05/28/22 1122 0     Pain Loc --      Pain Edu? --      Excl. in GC? --    No data found.  Updated Vital Signs BP (!) 146/76 (BP Location: Left Arm)   Pulse 72   Temp 98.7 F (37.1 C) (Oral)   Resp 14   SpO2 97%   Visual Acuity Right Eye Distance:   Left Eye Distance:   Bilateral Distance:    Right Eye Near:   Left Eye Near:    Bilateral Near:     Physical Exam Constitutional:      Appearance: Normal appearance.  Cardiovascular:     Rate and Rhythm:  Normal rate and regular rhythm.  Pulmonary:     Effort: Pulmonary effort is normal.     Breath sounds: Normal breath sounds.  Abdominal:     Palpations: Abdomen is soft.     Tenderness: There is no abdominal tenderness.  Skin:    General: Skin is warm.  Neurological:     General: No focal deficit present.     Mental Status: He is alert.  Psychiatric:        Mood and Affect: Mood normal.      UC Treatments / Results  Labs (all labs ordered are listed, but only abnormal results are displayed) Labs Reviewed  POCT URINALYSIS DIPSTICK, ED / UC - Abnormal; Notable for the following components:      Result Value   Hgb urine dipstick MODERATE (*)    All other components within normal limits  CYTOLOGY, (ORAL, ANAL, URETHRAL) ANCILLARY ONLY    EKG   Radiology No results found.  Procedures Procedures (including critical care time)  Medications Ordered in UC Medications - No data to display  Initial Impression / Assessment and Plan / UC Course  I have reviewed the triage vital signs and the nursing notes.  Pertinent labs & imaging results that were available during my care of the patient were reviewed by me and considered in my medical decision making (see chart for details).  Final Clinical Impressions(s) / UC Diagnoses   Final diagnoses:  Dysuria     Discharge Instructions      You were seen today for painful urination.  Your urine did not show overt infection.  The penile swab will be resulted by tomorrow and if anything is positive that we need to treat we will notify you and treat accordingly.  No intercourse until results are finalized.  If you continue with symptoms then please return or follow up with your primary care provider.  ED Prescriptions   None    PDMP not reviewed this encounter.   Jannifer FranklinPiontek, Kaleeya Hancock, MD 05/28/22 1152

## 2022-05-28 NOTE — ED Triage Notes (Signed)
Pt presents with c/o dysuria x 6-7 days

## 2022-05-28 NOTE — Discharge Instructions (Addendum)
You were seen today for painful urination.  Your urine did not show overt infection.  The penile swab will be resulted by tomorrow and if anything is positive that we need to treat we will notify you and treat accordingly.  No intercourse until results are finalized.  If you continue with symptoms then please return or follow up with your primary care provider.

## 2022-05-31 ENCOUNTER — Telehealth: Payer: Self-pay | Admitting: Cardiovascular Disease

## 2022-05-31 NOTE — Telephone Encounter (Signed)
Returned pt's call and confirmed coumadin clinic appt on Wednesday, 06/02/22. Pt verbalized understanding.

## 2022-05-31 NOTE — Telephone Encounter (Signed)
Pt would like a callback regarding upcoming appt on Wednesday. Please advise

## 2022-06-02 ENCOUNTER — Ambulatory Visit: Payer: Medicare PPO

## 2022-06-02 LAB — CYTOLOGY, (ORAL, ANAL, URETHRAL) ANCILLARY ONLY
Chlamydia: NEGATIVE
Comment: NEGATIVE
Comment: NEGATIVE
Comment: NORMAL
Trichomonas: NEGATIVE

## 2022-06-03 ENCOUNTER — Telehealth (HOSPITAL_COMMUNITY): Payer: Self-pay | Admitting: Emergency Medicine

## 2022-06-03 NOTE — Telephone Encounter (Signed)
Patient will need to return for recollect on cyto swab to get a result for Gonorrhea.   Attempted to reach patient x 1, LVM

## 2022-06-04 ENCOUNTER — Ambulatory Visit: Payer: Medicare PPO | Attending: Cardiovascular Disease | Admitting: *Deleted

## 2022-06-04 DIAGNOSIS — Z5181 Encounter for therapeutic drug level monitoring: Secondary | ICD-10-CM

## 2022-06-04 DIAGNOSIS — I824Y1 Acute embolism and thrombosis of unspecified deep veins of right proximal lower extremity: Secondary | ICD-10-CM

## 2022-06-04 DIAGNOSIS — I2699 Other pulmonary embolism without acute cor pulmonale: Secondary | ICD-10-CM

## 2022-06-04 LAB — POCT INR: INR: 1.9 — AB (ref 2.0–3.0)

## 2022-06-04 MED ORDER — WARFARIN SODIUM 5 MG PO TABS: ORAL_TABLET | ORAL | 0 refills | Status: DC

## 2022-06-04 NOTE — Patient Instructions (Signed)
Description   Today take 2.5 tablets then continue taking Warfarin 1 tablet daily except for 2 tablets on Mondays, Wednesdays and Fridays. Recheck INR in 6 weeks. Keep dark green leafy veggies consistent in your diet at least 2-3 times in your diet (collards, mixed greens, cabbage, broccoli, or spinach). Coumadin Clinic [phone] or [phone].

## 2022-06-17 ENCOUNTER — Ambulatory Visit: Payer: Medicare PPO | Admitting: Physician Assistant

## 2022-07-06 DIAGNOSIS — R6889 Other general symptoms and signs: Secondary | ICD-10-CM | POA: Diagnosis not present

## 2022-07-15 ENCOUNTER — Ambulatory Visit: Payer: Medicare HMO

## 2022-07-20 ENCOUNTER — Telehealth: Payer: Self-pay

## 2022-07-20 ENCOUNTER — Ambulatory Visit: Payer: Medicare HMO | Attending: Internal Medicine

## 2022-07-20 NOTE — Telephone Encounter (Signed)
Lpmtcb and reschedule INR check 

## 2022-07-23 ENCOUNTER — Other Ambulatory Visit: Payer: Self-pay | Admitting: Internal Medicine

## 2022-07-23 DIAGNOSIS — G8929 Other chronic pain: Secondary | ICD-10-CM

## 2022-07-23 NOTE — Telephone Encounter (Signed)
Requested Prescriptions  Pending Prescriptions Disp Refills   gabapentin (NEURONTIN) 300 MG capsule [Pharmacy Med Name: GABAPENTIN 300 MG Capsule] 90 capsule 1    Sig: TAKE 1 CAPSULE EVERY MORNING AND TAKE 2 CAPSULES EVERY [place]     Neurology: Anticonvulsants - gabapentin Passed - 07/23/2022  3:13 PM      Passed - Cr in normal range and within 360 days    Creat  Date Value Ref Range Status  08/11/2016 1.16 0.70 - 1.33 mg/dL Final    Comment:      For patients > or = 62 years of age: The upper reference limit for Creatinine is approximately 13% higher for people identified as African-American.      Creatinine, Ser  Date Value Ref Range Status  11/10/2021 1.25 0.76 - 1.27 mg/dL Final         Passed - Completed PHQ-2 or PHQ-9 in the last 360 days      Passed - Valid encounter within last 12 months    Recent Outpatient Visits           8 months ago Essential hypertension   [place], MD   1 year ago Chronic low back pain, unspecified back pain laterality, unspecified whether sciatica present   [place], MD   1 year ago Encounter for [employer] annual wellness exam   [place], [place], [place]   1 year ago Chronic left-sided lumbar radiculopathy   [place] Gildardo Pounds, NP   1 year ago Chronic left-sided lumbar radiculopathy   [place], MD       Future Appointments             In 1 week Gildardo Pounds, NP [place]

## 2022-08-03 ENCOUNTER — Telehealth: Payer: Self-pay | Admitting: Internal Medicine

## 2022-08-03 ENCOUNTER — Other Ambulatory Visit: Payer: Self-pay

## 2022-08-03 NOTE — Telephone Encounter (Signed)
Spoke with patient. Patient informed that medication was sent to  [place], [address] [place], [place] [address]   Patient voiced that this was ok and he would call [facility] to see when it was going to be delivered. Advised patient to call back if he encounters any problems with obtaining his prescription.

## 2022-08-03 NOTE — Telephone Encounter (Signed)
Patient is calling in because he requested a refill on gabapentin (NEURONTIN) 300 MG capsule around 02/09. Patient went to the Pharmacy and the Pharmacy told him they hadn't received anything. Patient wants to know when his medication will be available for him to pick up. Please follow up with patient.

## 2022-08-05 ENCOUNTER — Encounter: Payer: Self-pay | Admitting: Nurse Practitioner

## 2022-08-05 ENCOUNTER — Other Ambulatory Visit (HOSPITAL_COMMUNITY): Admission: RE | Admit: 2022-08-05 | Payer: Medicare HMO | Source: Ambulatory Visit

## 2022-08-05 ENCOUNTER — Other Ambulatory Visit (HOSPITAL_COMMUNITY)
Admission: RE | Admit: 2022-08-05 | Discharge: 2022-08-05 | Disposition: A | Discharge status: Home / Self Care | Payer: Medicare HMO | Source: Ambulatory Visit | Attending: Nurse Practitioner | Admitting: Nurse Practitioner

## 2022-08-05 ENCOUNTER — Ambulatory Visit: Payer: Medicare HMO | Attending: Nurse Practitioner | Admitting: Nurse Practitioner

## 2022-08-05 VITALS — BP 134/82 | HR 65 | Temp 98.3°F | Resp 16 | Wt 228.0 lb

## 2022-08-05 DIAGNOSIS — R300 Dysuria: Secondary | ICD-10-CM

## 2022-08-05 DIAGNOSIS — R7303 Prediabetes: Secondary | ICD-10-CM

## 2022-08-05 DIAGNOSIS — Z7251 High risk heterosexual behavior: Secondary | ICD-10-CM | POA: Diagnosis not present

## 2022-08-05 NOTE — Progress Notes (Signed)
Concerns with red spot on abdomen dysuria

## 2022-08-05 NOTE — Addendum Note (Signed)
Addended by: Carilyn Goodpasture on: 08/05/2022 04:57 PM   Modules accepted: Orders

## 2022-08-05 NOTE — Progress Notes (Signed)
Assessment & Plan:  Michael [name] was seen today for dysuria.  Diagnoses and all orders for this visit:  Dysuria -     Urine cytology ancillary only -     Urinalysis, Complete  High risk heterosexual behavior -     Urine cytology ancillary only -     Urinalysis, Complete  Prediabetes -     Hemoglobin A1c    Patient has been counseled on age-appropriate routine health concerns for screening and prevention. These are reviewed and up-to-date. Referrals have been placed accordingly. Immunizations are up-to-date or declined.    Subjective:   Chief Complaint  Patient presents with   Dysuria    Michael [name] 62 y.o. male presents to office today for dysuria and would like for me to assess his abdomen today for "bruising/red marks"   Endorses a tingling sensation when he urinates and also present when he does not have to urinate. He is currently engaging in high risk sexual behavior with a male. Denies any other GU symptoms. Was tested for gonorrhea, trich and chlamydia in December however there was not enough sample to test for gonorrhea and he would like to be retested. Wants to self swab and have urine cytology performed today.    Exam of abdomen today did not reveal any abnormal findings in regard to patient's concern     Review of Systems  Constitutional:  Negative for fever, malaise/fatigue and weight loss.  HENT: Negative.  Negative for nosebleeds.   Eyes: Negative.  Negative for blurred vision, double vision and photophobia.  Respiratory: Negative.  Negative for cough and shortness of breath.   Cardiovascular: Negative.  Negative for chest pain, palpitations and leg swelling.  Gastrointestinal: Negative.  Negative for heartburn, nausea and vomiting.  Genitourinary:  Positive for dysuria.  Musculoskeletal: Negative.  Negative for myalgias.  Neurological: Negative.  Negative for dizziness, focal weakness, seizures and headaches.  Psychiatric/Behavioral: Negative.   Negative for suicidal ideas.     Past Medical History:  Diagnosis Date   Alcohol abuse    Anterior myocardial infarction [facility])    ST-elevation; S/P emergent  drug-eluting stenting of proximal left anterior descending   Coronary artery disease    a. STEMI 2012 - s/p DES to LAD with  diffuse distal disease in the right posterolateral branch. The third OM was occluded and filled from left to left collaterals. LVEF preserved.   DVT complicating pregnancy    Dysuria 04/26/2013   Essential hypertension 05/16/2014   Frequent urination 09/03/2014   HNP (herniated nucleus pulposus), cervical 10/14/2014   Hyperlipidemia    Hypertension    Ischemia of extremity 01/23/2018   Lower abdominal pain 09/03/2014   PAD (peripheral artery disease) (HCC)    Pre-diabetes    Premature atrial contraction    PSVT (paroxysmal supraventricular tachycardia)    2 short runs on monitor 06/2019   Pulmonary emboli (HCC)    PVC's (premature ventricular contractions)    Renal insufficiency    Special screening for malignant neoplasms, colon     Past Surgical History:  Procedure Laterality Date   COLONOSCOPY N/A 08/16/2014   Procedure: COLONOSCOPY;  Surgeon: Danie Binder, MD;  [facility];  Service: Endoscopy;  Laterality: N/A;  10:15 AM   DOPPLER ECHOCARDIOGRAPHY     Preserved left venticular function   EMBOLECTOMY Right 01/23/2018   Procedure: RIGHT POPLITEAL-TIBIAL  ARTERY EMBOLECTOMY, EXPLORATION OF RIGHT ANTERIOR TIBIAL ARTERY, RIGHT POPLITEAL ARTERY VEIN ANGIOPLASTY;  Surgeon: Elam Dutch, MD;  [facility];  Service: Vascular;  Laterality: Right;   ENDARTERECTOMY POPLITEAL Left 01/23/2018   Procedure: ENDARTERECTOMY OF LEFT POPLITEAL ARTERY AND TIBIAL-PERONEAL TRUNK;  Surgeon: Elam Dutch, MD;  Location: [place];  Service: Vascular;  Laterality: Left;   FEMORAL-POPLITEAL BYPASS GRAFT Left 01/23/2018   Procedure: BYPASS GRAFT LEFT FEMORAL-POPLITEAL ARTERY WITH PROPATEN VASCULAR GRAFT;   Surgeon: Elam Dutch, MD;  Location: [place];  Service: Vascular;  Laterality: Left;   HEMATOMA EVACUATION Left 01/25/2018   Procedure: EVACUATION HEMATOMA LEFT POPLITEAL SPACE;  Surgeon: Marty Heck, MD;  Location: [place];  Service: Vascular;  Laterality: Left;   LEG SURGERY     left leg - has rod and 4 pins   PATELLAR TENDON REPAIR Right 04/26/2014   Procedure: RIGHT PATELLA TENDON REPAIR;  Surgeon: Renette Butters, MD;  Location: [place];  Service: Orthopedics;  Laterality: Right;    Family History  Problem Relation Age of Onset   Cancer Maternal Aunt    Hyperlipidemia Other    Diabetes Other    Heart disease Other     Social History Reviewed with no changes to be made today.   Outpatient Medications Prior to Visit  Medication Sig Dispense Refill   amLODipine (NORVASC) 5 MG tablet Take 1 tablet (5 mg total) by mouth daily. 90 tablet 0   aspirin EC 81 MG tablet Take 1 tablet (81 mg total) by mouth daily. 90 tablet 3   gabapentin (NEURONTIN) 300 MG capsule TAKE 1 CAPSULE EVERY MORNING AND TAKE 2 CAPSULES EVERY EVENING 90 capsule 1   latanoprost (XALATAN) 0.005 % ophthalmic solution      lisinopril (ZESTRIL) 40 MG tablet Take 1 tablet (40 mg total) by mouth daily. 90 tablet 3   methocarbamol (ROBAXIN) 500 MG tablet Take 1 tablet (500 mg total) by mouth 2 (two) times daily as needed for muscle spasms. 20 tablet 2   metoprolol tartrate (LOPRESSOR) 25 MG tablet Take 1 tablet (25 mg total) by mouth 2 (two) times daily. 180 tablet 3   nitroGLYCERIN (NITROSTAT) 0.4 MG SL tablet Place 1 tablet (0.4 mg total) under the tongue every 5 (five) minutes as needed for chest pain. 75 tablet 2   rosuvastatin (CRESTOR) 40 MG tablet Take 1 tablet by mouth once daily 90 tablet 3   silodosin (RAPAFLO) 8 MG CAPS capsule Take 8 mg by mouth at bedtime.     tamsulosin (FLOMAX) 0.4 MG CAPS capsule Take 1 capsule by mouth at bedtime 90 capsule 3   traMADol (ULTRAM) 50 MG tablet  Take 1 tablet (50 mg total) by mouth 4 (four) times daily as needed. 20 tablet 2   warfarin (COUMADIN) 5 MG tablet TAKE 1 TO 2  TABLETS BY MOUTH ONCE DAILY AS DIRECTED BY COUMADIN  CLINIC 120 tablet 0   No facility-administered medications prior to visit.    Allergies  Allergen Reactions   Ivp Dye [Iodinated Contrast Media] Swelling       Objective:    BP 134/82   Pulse 65   Temp 98.3 F (36.8 C)   Resp 16   Wt 228 lb (103.4 kg)   SpO2 97%   BMI 33.67 kg/m  Wt Readings from Last 3 Encounters:  08/05/22 228 lb (103.4 kg)  02/24/22 244 lb (110.7 kg)  12/22/21 244 lb 12.8 oz (111 kg)    Physical Exam Vitals and nursing note reviewed.  Constitutional:      Appearance: He is well-developed.  HENT:     Head: Normocephalic and atraumatic.  Cardiovascular:     Rate and Rhythm: Normal rate and regular rhythm.     Heart sounds: Normal heart sounds. No murmur heard.    No friction rub. No gallop.  Pulmonary:     Effort: Pulmonary effort is normal. No tachypnea or respiratory distress.     Breath sounds: Normal breath sounds. No decreased breath sounds, wheezing, rhonchi or rales.  Chest:     Chest wall: No tenderness.  Abdominal:     General: Bowel sounds are normal. There is distension.     Palpations: Abdomen is soft.  Musculoskeletal:        General: Normal range of motion.     Cervical back: Normal range of motion.  Skin:    General: Skin is warm and dry.  Neurological:     Mental Status: He is alert and oriented to person, place, and time.     Coordination: Coordination normal.  Psychiatric:        Behavior: Behavior normal. Behavior is cooperative.        Thought Content: Thought content normal.        Judgment: Judgment normal.          Patient has been counseled extensively about nutrition and exercise as well as the importance of adherence with medications and regular follow-up. The patient was given clear instructions to go to ER or return to medical  center if symptoms don't improve, worsen or new problems develop. The patient verbalized understanding.   Follow-up: Return in about 3 months (around 11/03/2022) for requests physical with Dr Wynetta Emery.   Gildardo Pounds, FNP-BC [facility] [place], [place]   08/05/2022, 2:57 PM

## 2022-08-06 ENCOUNTER — Encounter: Payer: Self-pay | Admitting: Nurse Practitioner

## 2022-08-06 LAB — URINALYSIS, COMPLETE
Bilirubin, UA: NEGATIVE
Glucose, UA: NEGATIVE
Ketones, UA: NEGATIVE
Leukocytes,UA: NEGATIVE
Nitrite, UA: NEGATIVE
Protein,UA: NEGATIVE
Specific Gravity, UA: 1.024 (ref 1.005–1.030)
Urobilinogen, Ur: 0.2 mg/dL (ref 0.2–1.0)
pH, UA: 5.5 (ref 5.0–7.5)

## 2022-08-06 LAB — HEMOGLOBIN A1C
Est. average glucose Bld gHb Est-mCnc: 140 mg/dL
Hgb A1c MFr Bld: 6.5 % — ABNORMAL HIGH (ref 4.8–5.6)

## 2022-08-06 LAB — MICROSCOPIC EXAMINATION
Bacteria, UA: NONE SEEN
Casts: NONE SEEN /lpf
Epithelial Cells (non renal): NONE SEEN /hpf (ref 0–10)
RBC, Urine: NONE SEEN /hpf (ref 0–2)
WBC, UA: NONE SEEN /hpf (ref 0–5)

## 2022-08-09 ENCOUNTER — Other Ambulatory Visit: Payer: Self-pay | Admitting: Nurse Practitioner

## 2022-08-09 LAB — CYTOLOGY, (ORAL, ANAL, URETHRAL) ANCILLARY ONLY
Chlamydia: POSITIVE — AB
Comment: NEGATIVE
Comment: NEGATIVE
Comment: NORMAL
Neisseria Gonorrhea: NEGATIVE
Trichomonas: POSITIVE — AB

## 2022-08-09 MED ORDER — DOXYCYCLINE HYCLATE 100 MG PO TABS: 100.0000 mg | ORAL_TABLET | Freq: Two times a day (BID) | ORAL | 0 refills | Status: AC

## 2022-08-09 MED ORDER — METRONIDAZOLE 500 MG PO TABS: 500.0000 mg | ORAL_TABLET | Freq: Two times a day (BID) | ORAL | 0 refills | Status: AC

## 2022-08-10 ENCOUNTER — Telehealth: Payer: Self-pay

## 2022-08-10 NOTE — Telephone Encounter (Signed)
Ry, RPH from [facility] calling to report contraindications on doxycycline and metronidazole that pt was prescribed on 08/09/22 by Army Melia, NP. They contraindicate with Warfarin and at risk for bleeding. He states the doxycycline isn't as high of risk as metronidazole so wanted to see if pt needs to continue with taking these medications or be switched to something different. Advised I would send to provider for review and can call back with recommendations. No further assistance was needed.   Kalman Jewels, RPH at [place], [place] [phone]

## 2022-08-10 NOTE — Telephone Encounter (Addendum)
Call sent  to  prescribing clinician in [place]  for immediate attention . Call also routed to prescribing clinician .

## 2022-08-10 NOTE — Telephone Encounter (Signed)
Prescribing Clinician spoke with pharmacist in regard to abx regimen.

## 2022-08-11 LAB — URINE CYTOLOGY ANCILLARY ONLY
Candida Urine: NEGATIVE
Chlamydia: NEGATIVE
Comment: NEGATIVE
Comment: NEGATIVE
Comment: NORMAL
Neisseria Gonorrhea: NEGATIVE
Trichomonas: NEGATIVE

## 2022-09-08 ENCOUNTER — Other Ambulatory Visit: Payer: Self-pay | Admitting: Internal Medicine

## 2022-09-08 DIAGNOSIS — M545 Other chronic pain: Secondary | ICD-10-CM

## 2022-09-08 MED ORDER — GABAPENTIN 300 MG PO CAPS: ORAL_CAPSULE | ORAL | 1 refills | Status: DC

## 2022-09-13 ENCOUNTER — Telehealth: Payer: Self-pay | Admitting: Internal Medicine

## 2022-09-13 ENCOUNTER — Ambulatory Visit: Payer: Self-pay | Admitting: *Deleted

## 2022-09-13 NOTE — Telephone Encounter (Signed)
Pt stated he was returning a nurse call. Pt requests call back at [phone]  ----- Message from Luciana Axe sent at 09/13/2022  2:17 PM EDT ----- Pt is calling to report that he is having urinary burning discomfort. Pt was evaluated and given medication in [place] on 08/05/22. Pt is requesting a refill of the medication given at that time. But does not know the name of the medication. Please advise    Able to reach pt, states "Someone already called me and scheduled me an Appt tomorrow at 3 to be retested. Noted in chart, appt at 3:00. No triage. Answer Assessment - Initial Assessment Questions 1. SEVERITY: "How bad is the pain?"  (e.g., Scale 1-10; mild, moderate, or severe)   - MILD (1-3): Complains slightly about urination hurting.   - MODERATE (4-7): Interferes with normal activities.     - SEVERE (8-10): Excruciating, unwilling or unable to urinate because of the pain.       2. FREQUENCY: "How many times have you had painful urination today?"     3. PATTERN: "Is pain present every time you urinate or just sometimes?"       4. ONSET: "When did the painful urination start?"       5. FEVER: "Do you have a fever?" If Yes, ask: "What is your temperature, how was it measured, and when did it start?"      6. PAST UTI: "Have you had a urine infection before?" If Yes, ask: "When was the last time?" and "What happened that time?"       7. CAUSE: "What do you think is causing the painful urination?"       8. OTHER SYMPTOMS: "Do you have any other symptoms?" (e.g., flank pain, penis discharge, scrotal pain, blood in urine)  Protocols used: Urination Pain - Male-A-AH

## 2022-09-14 ENCOUNTER — Ambulatory Visit: Payer: Medicare HMO

## 2022-09-15 ENCOUNTER — Ambulatory Visit: Payer: Medicare HMO

## 2022-09-17 ENCOUNTER — Ambulatory Visit: Payer: Medicare HMO

## 2022-09-20 ENCOUNTER — Ambulatory Visit: Payer: Medicare HMO | Attending: Internal Medicine

## 2022-09-20 ENCOUNTER — Telehealth: Payer: Self-pay | Admitting: *Deleted

## 2022-09-20 NOTE — Telephone Encounter (Signed)
Pt was scheduled to have INR checked. Pt did not come to appt. Called pt to reschedule appt. No answer.

## 2022-09-27 ENCOUNTER — Other Ambulatory Visit: Payer: Self-pay

## 2022-09-27 ENCOUNTER — Ambulatory Visit: Payer: Medicare HMO | Attending: Internal Medicine | Admitting: *Deleted

## 2022-09-27 ENCOUNTER — Other Ambulatory Visit (HOSPITAL_COMMUNITY)
Admission: RE | Admit: 2022-09-27 | Discharge: 2022-09-27 | Disposition: A | Discharge status: Home / Self Care | Payer: Medicare HMO | Source: Ambulatory Visit | Attending: Nurse Practitioner | Admitting: Nurse Practitioner

## 2022-09-27 ENCOUNTER — Other Ambulatory Visit: Payer: Self-pay | Admitting: Nurse Practitioner

## 2022-09-27 DIAGNOSIS — R300 Dysuria: Secondary | ICD-10-CM | POA: Diagnosis not present

## 2022-09-27 DIAGNOSIS — Z7251 High risk heterosexual behavior: Secondary | ICD-10-CM

## 2022-09-28 ENCOUNTER — Other Ambulatory Visit: Payer: Self-pay | Admitting: Cardiovascular Disease

## 2022-09-28 LAB — CYTOLOGY, (ORAL, ANAL, URETHRAL) ANCILLARY ONLY
Chlamydia: NEGATIVE
Comment: NEGATIVE
Comment: NEGATIVE
Comment: NORMAL
Neisseria Gonorrhea: NEGATIVE
Trichomonas: NEGATIVE

## 2022-09-29 LAB — URINE CYTOLOGY ANCILLARY ONLY
Bacterial Vaginitis-Urine: NEGATIVE
Candida Urine: NEGATIVE
Chlamydia: NEGATIVE
Comment: NEGATIVE
Comment: NEGATIVE
Comment: NORMAL
Neisseria Gonorrhea: NEGATIVE
Trichomonas: NEGATIVE

## 2022-10-06 ENCOUNTER — Telehealth: Payer: Self-pay | Admitting: Cardiovascular Disease

## 2022-10-06 ENCOUNTER — Ambulatory Visit: Payer: Medicare HMO | Attending: Internal Medicine | Admitting: Pharmacist

## 2022-10-06 DIAGNOSIS — I824Y1 Acute embolism and thrombosis of unspecified deep veins of right proximal lower extremity: Secondary | ICD-10-CM | POA: Diagnosis not present

## 2022-10-06 DIAGNOSIS — I2699 Other pulmonary embolism without acute cor pulmonale: Secondary | ICD-10-CM | POA: Diagnosis not present

## 2022-10-06 DIAGNOSIS — Z5181 Encounter for therapeutic drug level monitoring: Secondary | ICD-10-CM

## 2022-10-06 LAB — POCT INR: INR: 2.7 (ref 2.0–3.0)

## 2022-10-06 MED ORDER — WARFARIN SODIUM 5 MG PO TABS: ORAL_TABLET | ORAL | 0 refills | Status: DC

## 2022-10-06 NOTE — Patient Instructions (Signed)
Description   Continue taking Warfarin 1 tablet daily except for 2 tablets on Mondays, Wednesdays and Fridays. Recheck INR in 6 weeks. Keep dark green leafy veggies consistent in your diet at least 2-3 times in your diet (collards, mixed greens, cabbage, broccoli, or spinach). [facility] [phone] or [phone].       

## 2022-10-06 NOTE — Telephone Encounter (Signed)
Pt c/o medication issue:  1. Name of Medication:   warfarin (COUMADIN) 5 MG tablet    2. How are you currently taking this medication (dosage and times per day)? As written   3. Are you having a reaction (difficulty breathing--STAT)? no  4. What is your medication issue? [facility] pharmacy called in stating they need permission to change manufacturers to [employer]

## 2022-10-06 NOTE — Telephone Encounter (Signed)
Called pharmacy and advised ok to change generic Warfarin manufacturer. Placed note on upcoming Coumadin Clinic visit to make provider aware.

## 2022-10-12 NOTE — Progress Notes (Deleted)
Cardiology Office Note:    Date:  10/12/2022   ID:  Michael [name], DOB Sep 28, 1960, MRN [id number]  PCP:  Michael Matar, MD   [facility] Providers Cardiologist:  Michael Carrow, MD {  Referring MD: Michael Matar, MD   No chief complaint on file. ***  History of Present Illness:    Michael [name] is a 62 y.o. male with a hx of CAD, hypertension, history of alcohol abuse, PAD, HLD, history of PE/DVT, IV dye allergy, history of CVA, mild carotid disease.  Patient is followed by Dr. Clifton [name] and presents today for his annual follow-up.  Per chart review, patient was admitted to [facility] in 03/2011 with an anterior STEMI.  Found to have an occluded LAD now s/p DES x 1 to proximal LAD.Also found to have diffuse disease in the distal right posterolateral branch, and the third OM was occluded and filled from left-left collaterals. Of note, patient did have a severe reaction to the contrast dye. EF was preserved on echo. Later, in 01/2018, he had acute on chronic ischemia with severe tibial artery occlusive disease bilaterally leading to a right popliteal and tibial embolectomy with left femoral to below the knee popliteal bypass and left popliteal and tibial endarterectomy. He also had an acute DVT in his right lower extremity and bilateral PE and was stared on coumadin. Attempted to initiate xarelto, but patient could not afford it at the time. His compliance was poor on coumadin. He wore an event monitor in 06/2019 that showed sinus rhythm with bradycardia, rare PVCs, and rare PACs with two 4-5 beat runs of supraventricular tachycardia.   Patient's PAD is followed by vascular surgery. Most recent ABIs from 06/2021 showed moderate bilateral lower extremity arterial disease. Vascular surgery planned to repeat ABIs in 1 year and encouraged patient to walk as much as possible.   Patient was last seen by Dr. Clifton [name] on 10/26/21. At that time, patient was doing well and denied  cheat pain, dyspnea, palpitations. BP had been elevated at home and his lisinopril was increased to 40 mg daily. Echocardiogram from 11/10/2021 showed EF 45-50% with global hypokinesis and mid-- apical anterolateral and apical anterior akinesis.  There was grade 2 diastolic dysfunction, normal RV systolic function.  CAD  -Patient had an anterior STEMI in 2012 and cath showed an occluded LAD, diffuse disease in the distal right posterolateral branch, occluded third OM.  Patient was treated with DES x 1 to the proximal LAD - Most recent echocardiogram from 10/2021 showed EF 45-50% with global hypokinesis and mid-- apical anterolateral and apical anterior akinesis.  Per Dr. Gibson [name] notes, this is likely due to his history of anterior MI - Patient denies chest pain, shortness of breath on exertion - Continue ASA 81 mg daily, lisinopril 40 mg daily, and crestor 40 mg daily   Chronic HFmrEF  -Most recent echocardiogram from 10/2021 showed EF 45-50%, grade 2 diastolic dysfunction, normal RV systolic function, mild mitral valve regurgitation  - Per Dr. Clifton [name], reduced EF is likely due to history of anterior MI - Continue lisinopril 40 mg daily, metoprolol tartrate 25 mg BID  - Check BMP for lisinopril monitoring  - Patient euvolemic on exam   HTN - BP well controlled on current medications   HLD  - lipid panel from 10/2021 showed LDL 91, HDL 34, triglycerides 121, total cholesterol 147  - Ordered lipid panel today -   History of lower extremity DVT - Continue coumadin- followed by the coumadin  clinic  - INR was 2.7 on 4/24   PAD  - s/p left fem pop bypass in 2019  - Followed by vascular surgery, but does not appear to have seen them recerntly  - Instructed patient to contact vascular surgery for further management      Past Medical History:  Diagnosis Date   Alcohol abuse    Anterior myocardial infarction [facility])    ST-elevation; S/P emergent  drug-eluting stenting of proximal left  anterior descending   Coronary artery disease    a. STEMI 2012 - s/p DES to LAD with  diffuse distal disease in the right posterolateral branch. The third OM was occluded and filled from left to left collaterals. LVEF preserved.   DVT complicating pregnancy    Dysuria 04/26/2013   Essential hypertension 05/16/2014   Frequent urination 09/03/2014   HNP (herniated nucleus pulposus), cervical 10/14/2014   Hyperlipidemia    Hypertension    Ischemia of extremity 01/23/2018   Lower abdominal pain 09/03/2014   PAD (peripheral artery disease) (HCC)    Pre-diabetes    Premature atrial contraction    PSVT (paroxysmal supraventricular tachycardia)    2 short runs on monitor 06/2019   Pulmonary emboli (HCC)    PVC's (premature ventricular contractions)    Renal insufficiency    Special screening for malignant neoplasms, colon     Past Surgical History:  Procedure Laterality Date   COLONOSCOPY N/A 08/16/2014   Procedure: COLONOSCOPY;  Surgeon: West Bali, MD;  Location[facility];  Service: Endoscopy;  Laterality: N/A;  10:15 AM   DOPPLER ECHOCARDIOGRAPHY     Preserved left venticular function   EMBOLECTOMY Right 01/23/2018   Procedure: RIGHT POPLITEAL-TIBIAL  ARTERY EMBOLECTOMY, EXPLORATION OF RIGHT ANTERIOR TIBIAL ARTERY, RIGHT POPLITEAL ARTERY VEIN ANGIOPLASTY;  Surgeon: Sherren Kerns, MD;  Location: [facility] OR;  Service: Vascular;  Laterality: Right;   ENDARTERECTOMY POPLITEAL Left 01/23/2018   Procedure: ENDARTERECTOMY OF LEFT POPLITEAL ARTERY AND TIBIAL-PERONEAL TRUNK;  Surgeon: Sherren Kerns, MD;  Location: [facility] OR;  Service: Vascular;  Laterality: Left;   FEMORAL-POPLITEAL BYPASS GRAFT Left 01/23/2018   Procedure: BYPASS GRAFT LEFT FEMORAL-POPLITEAL ARTERY WITH PROPATEN VASCULAR GRAFT;  Surgeon: Sherren Kerns, MD;  Location: [facility] OR;  Service: Vascular;  Laterality: Left;   HEMATOMA EVACUATION Left 01/25/2018   Procedure: EVACUATION HEMATOMA LEFT POPLITEAL SPACE;  Surgeon: Cephus Shelling, MD;  Location: [facility] OR;  Service: Vascular;  Laterality: Left;   LEG SURGERY     left leg - has rod and 4 pins   PATELLAR TENDON REPAIR Right 04/26/2014   Procedure: RIGHT PATELLA TENDON REPAIR;  Surgeon: Sheral Apley, MD;  Location: [facility];  Service: Orthopedics;  Laterality: Right;    Current Medications: No outpatient medications have been marked as taking for the 10/13/22 encounter (Appointment) with Tereso Newcomer T, PA-C.     Allergies:   Ivp dye [iodinated contrast media]   Social History   Socioeconomic History   Marital status: Significant Other    Spouse name: Not on file   Number of children: 0   Years of education: Not on file   Highest education level: Not on file  Occupational History   Not on file  Tobacco Use   Smoking status: Never   Smokeless tobacco: Never  Substance and Sexual Activity   Alcohol use: Yes    Comment: Drinks occassionally   Drug use: No   Sexual activity: Not on file  Other Topics  Concern   Not on file  Social History Narrative   Not on file   Social Determinants of Health   Financial Resource Strain: Not on file  Food Insecurity: Not on file  Transportation Needs: Not on file  Physical Activity: Not on file  Stress: Not on file  Social Connections: Not on file     Family History: The patient's family history includes Cancer in his maternal aunt; Diabetes in an other family member; Heart disease in an other family member; Hyperlipidemia in an other family member.  ROS:   Please see the history of present illness.     All other systems reviewed and are negative.  EKGs/Labs/Other Studies Reviewed:    The following studies were reviewed today: Cardiac Studies & Procedures       ECHOCARDIOGRAM  ECHOCARDIOGRAM COMPLETE 11/10/2021  Narrative ECHOCARDIOGRAM REPORT    Patient Name:   COLTAN FRANCHETTI Date of Exam: 11/10/2021 Medical Rec #:  [id number]           Height:       69.0  in Accession #:    [id number]          Weight:       244.2 lb Date of Birth:  12/08/1960            BSA:          2.249 m Patient Age:    60 years            BP:           160/90 mmHg Patient Gender: M                   HR:           67 bpm. Exam Location:  [address]  Procedure: 2D Echo, Cardiac Doppler, Color Doppler and 3D Echo  Indications:    I25.10 CAD  History:        Patient has no prior history of Echocardiogram examinations. CAD, Arrythmias:SVT, PVC and PAC; Risk Factors:Hypertension, Dyslipidemia and Alcohol abuse.  Sonographer:    Samule Ohm RDCS Referring Phys: [id number] CHRISTOPHER D MCALHANY  IMPRESSIONS   1. Global hypokinesis with mid-apical anterolateral and apical anterior akinesis. Left ventricular ejection fraction, by estimation, is 45 to 50%. The left ventricle has mildly decreased function. The left ventricle demonstrates global hypokinesis. The left ventricular internal cavity size was mildly dilated. There is mild concentric left ventricular hypertrophy. Left ventricular diastolic parameters are consistent with Grade II diastolic dysfunction (pseudonormalization). 2. Right ventricular systolic function is normal. The right ventricular size is normal. 3. Left atrial size was severely dilated. 4. The mitral valve is normal in structure. Mild mitral valve regurgitation. No evidence of mitral stenosis. 5. The aortic valve is tricuspid. Aortic valve regurgitation is not visualized. No aortic stenosis is present. 6. Aortic dilatation noted. There is mild dilatation of the aortic root and of the ascending aorta, measuring 36 mm. 7. The inferior vena cava is normal in size with greater than 50% respiratory variability, suggesting right atrial pressure of 3 mmHg.  FINDINGS Left Ventricle: Global hypokinesis with mid-apical anterolateral and apical anterior akinesis. Left ventricular ejection fraction, by estimation, is 45 to 50%. The left ventricle has mildly  decreased function. The left ventricle demonstrates global hypokinesis. The left ventricular internal cavity size was mildly dilated. There is mild concentric left ventricular hypertrophy. Left ventricular diastolic parameters are consistent with Grade II diastolic dysfunction (pseudonormalization). Indeterminate filling pressures.  Right Ventricle:  The right ventricular size is normal. No increase in right ventricular wall thickness. Right ventricular systolic function is normal.  Left Atrium: Left atrial size was severely dilated.  Right Atrium: Right atrial size was normal in size.  Pericardium: There is no evidence of pericardial effusion.  Mitral Valve: The mitral valve is normal in structure. Mild mitral valve regurgitation. No evidence of mitral valve stenosis.  Tricuspid Valve: The tricuspid valve is normal in structure. Tricuspid valve regurgitation is not demonstrated. No evidence of tricuspid stenosis.  Aortic Valve: The aortic valve is tricuspid. Aortic valve regurgitation is not visualized. No aortic stenosis is present.  Pulmonic Valve: The pulmonic valve was normal in structure. Pulmonic valve regurgitation is not visualized. No evidence of pulmonic stenosis.  Aorta: Aortic dilatation noted. There is mild dilatation of the aortic root and of the ascending aorta, measuring 36 mm.  Venous: The inferior vena cava is normal in size with greater than 50% respiratory variability, suggesting right atrial pressure of 3 mmHg.  IAS/Shunts: No atrial level shunt detected by color flow Doppler.   LEFT VENTRICLE PLAX 2D LVIDd:         6.40 cm   Diastology LVIDs:         4.90 cm   LV e' medial:    7.40 cm/s LV PW:         1.10 cm   LV E/e' medial:  13.5 LV IVS:        1.10 cm   LV e' lateral:   10.00 cm/s LVOT diam:     2.00 cm   LV E/e' lateral: 10.0 LV SV:         69 LV SV Index:   31 LVOT Area:     3.14 cm  3D Volume EF: 3D EF:        45 % LV EDV:       164 ml LV ESV:        90 ml LV SV:        75 ml  RIGHT VENTRICLE            IVC RV S prime:     8.92 cm/s  IVC diam: 1.10 cm TAPSE (M-mode): 1.4 cm  LEFT ATRIUM              Index        RIGHT ATRIUM           Index LA diam:        5.50 cm  2.45 cm/m   RA Pressure: 3.00 mmHg LA Vol (A2C):   104.0 ml 46.24 ml/m  RA Area:     19.60 cm LA Vol (A4C):   130.0 ml 57.80 ml/m  RA Volume:   51.80 ml  23.03 ml/m LA Biplane Vol: 115.0 ml 51.13 ml/m AORTIC VALVE LVOT Vmax:   104.00 cm/s LVOT Vmean:  71.500 cm/s LVOT VTI:    0.220 m  AORTA Ao Root diam: 3.60 cm Ao Asc diam:  3.60 cm  MITRAL VALVE                TRICUSPID VALVE MV Area (PHT): 4.26 cm     Estimated RAP:  3.00 mmHg MV Decel Time: 178 msec MV E velocity: 100.00 cm/s  SHUNTS MV A velocity: 57.60 cm/s   Systemic VTI:  0.22 m MV E/A ratio:  1.74         Systemic Diam: 2.00 cm  Chilton Si MD Electronically signed by Chilton Si  MD Signature Date/Time: 11/10/2021/12:19:13 PM    Final    MONITORS  LONG TERM MONITOR (3-14 DAYS) 07/12/2019  Narrative Sinus rhythm with sinus bradycardia Rare premature ventricular contractions (PVCs) Rare premature atrial contractions with two 4-5 beat runs of supraventricular tachycardia.            EKG:  EKG is *** ordered today.  The ekg ordered today demonstrates ***  Recent Labs: 11/10/2021: ALT 36; BUN 19; Creatinine, Ser 1.25; Potassium 4.5; Sodium 137 11/16/2021: Hemoglobin 13.1; Platelets 198  Recent Lipid Panel    Component Value Date/Time   CHOL 147 11/10/2021 0724   TRIG 121 11/10/2021 0724   HDL 34 (L) 11/10/2021 0724   CHOLHDL 4.3 11/10/2021 0724   CHOLHDL 3.6 08/11/2016 1030   VLDL 19 08/11/2016 1030   LDLCALC 91 11/10/2021 0724   LDLDIRECT 164.1 05/04/2013 1107     Risk Assessment/Calculations:   {Does this patient have ATRIAL FIBRILLATION?:[phone]}  No BP recorded.  {Refresh Note OR Click here to enter BP  :1}***         Physical Exam:    VS:  There were  no vitals taken for this visit.    Wt Readings from Last 3 Encounters:  08/05/22 228 lb (103.4 kg)  02/24/22 244 lb (110.7 kg)  12/22/21 244 lb 12.8 oz (111 kg)     GEN: *** Well nourished, well developed in no acute distress HEENT: Normal NECK: No JVD; No carotid bruits LYMPHATICS: No lymphadenopathy CARDIAC: ***RRR, no murmurs, rubs, gallops RESPIRATORY:  Clear to auscultation without rales, wheezing or rhonchi  ABDOMEN: Soft, non-tender, non-distended MUSCULOSKELETAL:  No edema; No deformity  SKIN: Warm and dry NEUROLOGIC:  Alert and oriented x 3 PSYCHIATRIC:  Normal affect   ASSESSMENT:    No diagnosis found. PLAN:    In order of problems listed above:  ***      {Are you ordering a CV Procedure (e.g. stress test, cath, DCCV, TEE, etc)?   Press F2        :[id number]}    Medication Adjustments/Labs and Tests Ordered: Current medicines are reviewed at length with the patient today.  Concerns regarding medicines are outlined above.  No orders of the defined types were placed in this encounter.  No orders of the defined types were placed in this encounter.   There are no Patient Instructions on file for this visit.   Signed, Jonita Albee, PA-C  10/12/2022 8:58 AM    [facility]

## 2022-10-13 ENCOUNTER — Ambulatory Visit: Payer: Medicare HMO | Attending: Physician Assistant | Admitting: Physician Assistant

## 2022-10-14 ENCOUNTER — Encounter: Payer: Self-pay | Admitting: Physician Assistant

## 2022-11-05 ENCOUNTER — Encounter: Payer: Medicare HMO | Admitting: Internal Medicine

## 2022-11-16 ENCOUNTER — Telehealth: Payer: Self-pay | Admitting: *Deleted

## 2022-11-16 ENCOUNTER — Ambulatory Visit: Payer: Medicare HMO | Attending: Cardiovascular Disease

## 2022-11-16 NOTE — Telephone Encounter (Signed)
Called pt since he missed his Anticoagulation Appt this morning; left a message to call back to reschedule.

## 2022-11-24 ENCOUNTER — Other Ambulatory Visit: Payer: Self-pay | Admitting: Cardiovascular Disease

## 2022-11-24 ENCOUNTER — Telehealth: Payer: Self-pay | Admitting: Internal Medicine

## 2022-11-24 DIAGNOSIS — I2699 Other pulmonary embolism without acute cor pulmonale: Secondary | ICD-10-CM

## 2022-11-24 DIAGNOSIS — Z5181 Encounter for therapeutic drug level monitoring: Secondary | ICD-10-CM

## 2022-11-24 DIAGNOSIS — I824Y1 Acute embolism and thrombosis of unspecified deep veins of right proximal lower extremity: Secondary | ICD-10-CM

## 2022-11-24 NOTE — Telephone Encounter (Signed)
Call placed to patient unable to reach message left on VM. To advise patient to reach out to his pharmacy and explain what happened to his Medication. His pharmacy may can assist in replacing medication until he able to refill.

## 2022-11-24 NOTE — Telephone Encounter (Signed)
Refill request for warfarin:  Last INR was 2.7 on 10/06/22 INR appt due 11/16/22  (Missed appt)  LM for pt to call and make INR appt.Refill approved for 20 tablets only.

## 2022-11-24 NOTE — Telephone Encounter (Signed)
Medication Refill - Medication: warfarin (COUMADIN) 5 MG tablet   Has the patient contacted their pharmacy? No.   Preferred Pharmacy (with phone number or street name):  [facility] [address] [place], [place] - [address] Phone: [phone]  Fax: [phone]      Has the patient been seen for an appointment in the last year OR does the patient have an upcoming appointment? Yes.    Please assist patient as he said he spilled the whole entire bottle in the commode and the lid was off. He takes this medicine everyday and does not have any now since he spilled them.

## 2022-11-26 ENCOUNTER — Ambulatory Visit: Payer: Medicare HMO | Attending: Cardiovascular Disease

## 2022-11-26 ENCOUNTER — Other Ambulatory Visit: Payer: Self-pay

## 2022-11-26 DIAGNOSIS — I2699 Other pulmonary embolism without acute cor pulmonale: Secondary | ICD-10-CM | POA: Diagnosis not present

## 2022-11-26 DIAGNOSIS — Z5181 Encounter for therapeutic drug level monitoring: Secondary | ICD-10-CM | POA: Diagnosis not present

## 2022-11-26 DIAGNOSIS — I824Y1 Acute embolism and thrombosis of unspecified deep veins of right proximal lower extremity: Secondary | ICD-10-CM | POA: Diagnosis not present

## 2022-11-26 LAB — POCT INR: INR: 1.6 — AB (ref 2.0–3.0)

## 2022-11-26 MED ORDER — WARFARIN SODIUM 5 MG PO TABS: ORAL_TABLET | ORAL | 0 refills | Status: DC

## 2022-11-26 NOTE — Patient Instructions (Signed)
Took 2 tablets on 6/13 so Continue taking Warfarin 1 tablet daily except for 2 tablets on Mondays, Wednesdays and Fridays. Recheck INR in 4 weeks. Keep dark green leafy veggies consistent in your diet at least 2-3 times in your diet (collards, mixed greens, cabbage, broccoli, or spinach). [facility]in Clinic [phone] or [phone].

## 2022-12-16 ENCOUNTER — Other Ambulatory Visit: Payer: Self-pay | Admitting: Cardiovascular Disease

## 2022-12-17 MED ORDER — LISINOPRIL 40 MG PO TABS: 40.0000 mg | ORAL_TABLET | Freq: Every day | ORAL | 0 refills | Status: DC

## 2022-12-17 NOTE — Addendum Note (Signed)
Addended by: Rodman Recupero, [place] M on: 12/17/2022 11:22 AM   Modules accepted: Orders

## 2022-12-24 ENCOUNTER — Ambulatory Visit: Payer: Medicare HMO | Attending: Cardiovascular Disease

## 2022-12-24 DIAGNOSIS — I2699 Other pulmonary embolism without acute cor pulmonale: Secondary | ICD-10-CM | POA: Diagnosis not present

## 2022-12-24 DIAGNOSIS — I824Y1 Acute embolism and thrombosis of unspecified deep veins of right proximal lower extremity: Secondary | ICD-10-CM | POA: Diagnosis not present

## 2022-12-24 DIAGNOSIS — Z5181 Encounter for therapeutic drug level monitoring: Secondary | ICD-10-CM

## 2022-12-24 LAB — POCT INR: INR: 1.7 — AB (ref 2.0–3.0)

## 2022-12-24 NOTE — Patient Instructions (Addendum)
TAKE 2.5 TABLETS TODAY and 2 TABLETS SATURDAY THEN Continue taking Warfarin 1 tablet daily except for 2 tablets on Mondays, Wednesdays and Fridays. Recheck INR in 5 weeks PER PATIENT. Keep dark green leafy veggies consistent in your diet at least 2 times in your diet (collards, mixed greens, cabbage, broccoli, or spinach). Coumadin Clinic [phone] or [phone].

## 2022-12-27 ENCOUNTER — Ambulatory Visit: Payer: Medicare HMO | Attending: Internal Medicine | Admitting: Internal Medicine

## 2022-12-27 ENCOUNTER — Other Ambulatory Visit (HOSPITAL_COMMUNITY)
Admission: RE | Admit: 2022-12-27 | Discharge: 2022-12-27 | Disposition: A | Discharge status: Home / Self Care | Payer: Medicare HMO | Source: Ambulatory Visit | Attending: Internal Medicine | Admitting: Internal Medicine

## 2022-12-27 ENCOUNTER — Encounter: Payer: Self-pay | Admitting: Internal Medicine

## 2022-12-27 VITALS — BP 125/76 | HR 62 | Temp 98.5°F | Ht 69.0 in | Wt 228.0 lb

## 2022-12-27 DIAGNOSIS — R3589 Other polyuria: Secondary | ICD-10-CM | POA: Diagnosis not present

## 2022-12-27 DIAGNOSIS — R7303 Prediabetes: Secondary | ICD-10-CM

## 2022-12-27 DIAGNOSIS — Z125 Encounter for screening for malignant neoplasm of prostate: Secondary | ICD-10-CM | POA: Diagnosis not present

## 2022-12-27 DIAGNOSIS — R300 Dysuria: Secondary | ICD-10-CM | POA: Insufficient documentation

## 2022-12-27 DIAGNOSIS — I2510 Atherosclerotic heart disease of native coronary artery without angina pectoris: Secondary | ICD-10-CM | POA: Diagnosis not present

## 2022-12-27 DIAGNOSIS — N401 Enlarged prostate with lower urinary tract symptoms: Secondary | ICD-10-CM | POA: Diagnosis not present

## 2022-12-27 DIAGNOSIS — I10 Essential (primary) hypertension: Secondary | ICD-10-CM | POA: Diagnosis not present

## 2022-12-27 DIAGNOSIS — E669 Obesity, unspecified: Secondary | ICD-10-CM

## 2022-12-27 DIAGNOSIS — Z23 Encounter for immunization: Secondary | ICD-10-CM

## 2022-12-27 DIAGNOSIS — I739 Peripheral vascular disease, unspecified: Secondary | ICD-10-CM | POA: Diagnosis not present

## 2022-12-27 DIAGNOSIS — M545 Low back pain, unspecified: Secondary | ICD-10-CM

## 2022-12-27 DIAGNOSIS — R350 Frequency of micturition: Secondary | ICD-10-CM | POA: Diagnosis not present

## 2022-12-27 DIAGNOSIS — G8929 Other chronic pain: Secondary | ICD-10-CM

## 2022-12-27 DIAGNOSIS — Z86711 Personal history of pulmonary embolism: Secondary | ICD-10-CM

## 2022-12-27 LAB — POCT GLYCOSYLATED HEMOGLOBIN (HGB A1C): HbA1c, POC (prediabetic range): 6.4 % (ref 5.7–6.4)

## 2022-12-27 LAB — GLUCOSE, POCT (MANUAL RESULT ENTRY): POC Glucose: 125 mg/dl — AB (ref 70–99)

## 2022-12-27 MED ORDER — ZOSTER VAC RECOMB ADJUVANTED 50 MCG/0.5ML IM SUSR: 0.5000 mL | Freq: Once | INTRAMUSCULAR | 0 refills | Status: AC

## 2022-12-27 MED ORDER — GABAPENTIN 300 MG PO CAPS: ORAL_CAPSULE | ORAL | 1 refills | Status: DC

## 2022-12-27 MED ORDER — LISINOPRIL 40 MG PO TABS: 40.0000 mg | ORAL_TABLET | Freq: Every day | ORAL | 1 refills | Status: DC

## 2022-12-27 MED ORDER — METOPROLOL TARTRATE 25 MG PO TABS: 25.0000 mg | ORAL_TABLET | Freq: Two times a day (BID) | ORAL | 1 refills | Status: DC

## 2022-12-27 MED ORDER — ROSUVASTATIN CALCIUM 40 MG PO TABS: 40.0000 mg | ORAL_TABLET | Freq: Every day | ORAL | 3 refills | Status: DC

## 2022-12-27 MED ORDER — TAMSULOSIN HCL 0.4 MG PO CAPS: 0.4000 mg | ORAL_CAPSULE | Freq: Every day | ORAL | 3 refills | Status: DC

## 2022-12-27 NOTE — Progress Notes (Signed)
Patient ID: Michael [name], male    DOB: 10/15/1960  MRN: [id number]  CC: Annual Exam (Physical. Med refill - Warfarin, diclofenac sodium topical gel./Burning sensation when urinating /Pt informed of spot in either kidneys or liver - pt unsure which organ/ )   Subjective: Michael [name] is a 62 y.o. male who presents for annual exam.  Has Medicare and will need MWV instead on future visit His concerns today include:  Patient with history of HTN, CAD, PAD, PE/DVT on Coumadin followed at cardiology anticoag clinic, HL, CVA by MRI 11/2018, mild carotid artery disease, prediabetes.  Chronic lower back pain   My 1st yr med student, Leonor Liv is present  Patient last seen by me 10/2021.  Obesity/prediabetes:  Results for orders placed or performed in visit on 12/27/22  POCT glucose (manual entry)  Result Value Ref Range   POC Glucose 125 (A) 70 - 99 mg/dl  POCT glycosylated hemoglobin (Hb A1C)  Result Value Ref Range   Hemoglobin A1C     HbA1c POC (<> result, manual entry)     HbA1c, POC (prediabetic range) 6.4 5.7 - 6.4 %   HbA1c, POC (controlled diabetic range)    Had seen the nurse practitioner earlier this year and A1c came back at 6.5. Would like to be rechecked since this was the first time A1C was >6.4.  Did not have BS drawn at that time. Since then he has been playing pickle ball 2x/wk and going to gym 2-3x/wk to ride bicycle and some wgh training Had cut back on white carbs except this wk.  Gets urge to get up in middle of night to snack 2-3 o'clock in a.m Wgh stable compared to when last seen 07/2022 Bottom of feet stay numb Poor peripheral vision.  Has appt 02/2023 for eye exam.  History of PE/DVT: Followed by Coumadin clinic through cardiology. Now has Medicare.  Not on DOAC; cost prohibitive even with insurance.    CAD/PAD/HTN: Should be on amlodipine 5 mg daily, lisinopril 40 mg daily, metoprolol 25 mg twice a day, Crestor 40 mg daily and aspirin 81 mg daily.  Not   taking Norvasc; tells me we had told him to stop as BP was good without it No CP/SOB. Cramps in calf BL when he is walking back from his mailbox BPH:  needs RF on Flomax.  Still has frequent urination at nights without it. Complains of dysuria off and on over the past several weeks.  Denies any penile discharge.  No new sex partners.  He would like to be screened for chlamydia, gonorrhea and trichomonas. Patient Active Problem List   Diagnosis Date Noted   Prediabetes 12/04/2020   Obesity (BMI 30.0-34.9) 12/04/2020   DVT, lower extremity (HCC) 03/14/2019   History of pulmonary embolism 09/11/2018   Obsessive-compulsive disorder    Muscle cramps    Postoperative pain    Therapeutic opioid induced constipation    CKD (chronic kidney disease), stage II    Acute blood loss anemia    Debility    Ischemia of extremity 01/23/2018   Frequency of urination and polyuria 07/06/2017   Erectile dysfunction due to diseases classified elsewhere 10/27/2016   PAD (peripheral artery disease) (HCC) 08/11/2016   Nocturia 08/11/2016   Chronic pain of both knees 04/01/2016   Palpitations 05/05/2015   Hyperlipidemia 05/05/2015   HNP (herniated nucleus pulposus), cervical 10/14/2014   Chronic low back pain 10/14/2014   Numbness and tingling of both upper extremities while sleeping  10/14/2014   Frequent urination 09/03/2014   Lower abdominal pain 09/03/2014   Cervical stenosis of spine 05/16/2014   Essential hypertension 05/16/2014   Numbness and tingling of right arm 11/28/2013   Dental abscess 04/26/2013   Dysuria 04/26/2013   HTN (hypertension) 04/04/2013   CAD (coronary artery disease) 05/25/2011     Current Outpatient Medications on File Prior to Visit  Medication Sig Dispense Refill   aspirin EC 81 MG tablet Take 1 tablet (81 mg total) by mouth daily. 90 tablet 3   latanoprost (XALATAN) 0.005 % ophthalmic solution      nitroGLYCERIN (NITROSTAT) 0.4 MG SL tablet Place 1 tablet (0.4 mg total)  under the tongue every 5 (five) minutes as needed for chest pain. 75 tablet 2   warfarin (COUMADIN) 5 MG tablet TAKE 1 TO 2  TABLETS BY MOUTH ONCE DAILY AS DIRECTED BY COUMADIN  CLINIC 50 tablet 0   No current facility-administered medications on file prior to visit.    Allergies  Allergen Reactions   Ivp Dye [Iodinated Contrast Media] Swelling    Social History   Socioeconomic History   Marital status: Significant Other    Spouse name: Not on file   Number of children: 0   Years of education: Not on file   Highest education level: Not on file  Occupational History   Not on file  Tobacco Use   Smoking status: Never   Smokeless tobacco: Never  Substance and Sexual Activity   Alcohol use: Yes    Comment: Drinks occassionally   Drug use: No   Sexual activity: Not on file  Other Topics Concern   Not on file  Social History Narrative   Not on file   Social Determinants of Health   Financial Resource Strain: Not on file  Food Insecurity: Not on file  Transportation Needs: Not on file  Physical Activity: Not on file  Stress: Not on file  Social Connections: Not on file  Intimate Partner Violence: Not on file    Family History  Problem Relation Age of Onset   Cancer Maternal Aunt    Hyperlipidemia Other    Diabetes Other    Heart disease Other     Past Surgical History:  Procedure Laterality Date   COLONOSCOPY N/A 08/16/2014   Procedure: COLONOSCOPY;  Surgeon: West Bali, MD;  Location: [facility];  Service: Endoscopy;  Laterality: N/A;  10:15 AM   DOPPLER ECHOCARDIOGRAPHY     Preserved left venticular function   EMBOLECTOMY Right 01/23/2018   Procedure: RIGHT POPLITEAL-TIBIAL  ARTERY EMBOLECTOMY, EXPLORATION OF RIGHT ANTERIOR TIBIAL ARTERY, RIGHT POPLITEAL ARTERY VEIN ANGIOPLASTY;  Surgeon: Sherren Kerns, MD;  Location: [facility] OR;  Service: Vascular;  Laterality: Right;   ENDARTERECTOMY POPLITEAL Left 01/23/2018   Procedure: ENDARTERECTOMY OF LEFT POPLITEAL  ARTERY AND TIBIAL-PERONEAL TRUNK;  Surgeon: Sherren Kerns, MD;  Location: [facility] OR;  Service: Vascular;  Laterality: Left;   FEMORAL-POPLITEAL BYPASS GRAFT Left 01/23/2018   Procedure: BYPASS GRAFT LEFT FEMORAL-POPLITEAL ARTERY WITH PROPATEN VASCULAR GRAFT;  Surgeon: Sherren Kerns, MD;  Location: [facility] OR;  Service: Vascular;  Laterality: Left;   HEMATOMA EVACUATION Left 01/25/2018   Procedure: EVACUATION HEMATOMA LEFT POPLITEAL SPACE;  Surgeon: Cephus Shelling, MD;  Location: [facility] OR;  Service: Vascular;  Laterality: Left;   LEG SURGERY     left leg - has rod and 4 pins   PATELLAR TENDON REPAIR Right 04/26/2014   Procedure: RIGHT PATELLA TENDON REPAIR;  Surgeon:  Sheral Apley, MD;  Location:  SURGERY CENTER;  Service: Orthopedics;  Laterality: Right;    ROS: Review of Systems Negative except as stated above  PHYSICAL EXAM: BP 125/76 (BP Location: Left Arm, Patient Position: Sitting, Cuff Size: Normal)   Pulse 62   Temp 98.5 F (36.9 C) (Oral)   Ht 5\' 9"  (1.753 m)   Wt 228 lb (103.4 kg)   SpO2 99%   BMI 33.67 kg/m   Wt Readings from Last 3 Encounters:  12/27/22 228 lb (103.4 kg)  08/05/22 228 lb (103.4 kg)  02/24/22 244 lb (110.7 kg)    Physical Exam  General appearance - alert, well appearing, older African-American male and in no distress Mental status - normal mood, behavior, speech, dress, motor activity, and thought processes Eyes - pupils equal and reactive, extraocular eye movements intact Ears - bilateral TM's and external ear canals normal Nose - normal and patent, no erythema, discharge or polyps Mouth -he has dentures above.  No oral lesions. Neck - supple, no significant adenopathy Chest - clear to auscultation, no wheezes, rales or rhonchi, symmetric air entry Heart - normal rate, regular rhythm, normal S1, S2, no murmurs, rubs, clicks or gallops Extremities - peripheral pulses normal, no pedal edema, no clubbing or cyanosis Feet:  LEAP is  normal.  Toenails are thickened discolored.  Some peeling of skin on the heels.  Good dorsalis pedis and posterior tibialis pulses.  Legs are warm.  Results for orders placed or performed in visit on 12/27/22  POCT glucose (manual entry)  Result Value Ref Range   POC Glucose 125 (A) 70 - 99 mg/dl  POCT glycosylated hemoglobin (Hb A1C)  Result Value Ref Range   Hemoglobin A1C     HbA1c POC (<> result, manual entry)     HbA1c, POC (prediabetic range) 6.4 5.7 - 6.4 %   HbA1c, POC (controlled diabetic range)    Not fasting     12/27/2022   10:45 AM 08/05/2022    2:29 PM 12/22/2021   11:11 AM  Depression screen PHQ 2/9  Decreased Interest 0 0 0  Down, Depressed, Hopeless 0 0 0  PHQ - 2 Score 0 0 0  Altered sleeping 0 0   Tired, decreased energy 0 0   Change in appetite 1 0   Feeling bad or failure about yourself  0 0   Trouble concentrating 0 0   Moving slowly or fidgety/restless 0 0   Suicidal thoughts 0 0   PHQ-9 Score 1 0       12/27/2022   11:29 AM 12/27/2022   10:45 AM 08/05/2022    2:29 PM 12/04/2020    8:57 AM  GAD 7 : Generalized Anxiety Score  Nervous, Anxious, on Edge 0 3 0 0  Control/stop worrying 0 3 0 0  Worry too much - different things 0 3 0 0  Trouble relaxing 0 3 0 0  Restless 0 3 0 0  Easily annoyed or irritable 0 3 0 0  Afraid - awful might happen 0 3 0 0  Total GAD 7 Score 0 21 0 0        Latest Ref Rng & Units 11/10/2021    7:24 AM 08/04/2020   10:15 AM 12/01/2018   12:45 PM  CMP  Glucose 70 - 99 mg/dL 161  75  096   BUN 8 - 27 mg/dL 19  21  21    Creatinine 0.76 - 1.27 mg/dL 0.45  4.09  1.20   Sodium 134 - 144 mmol/L 137  139  137   Potassium 3.5 - 5.2 mmol/L 4.5  4.4  3.9   Chloride 96 - 106 mmol/L 100  102  103   CO2 20 - 29 mmol/L 25  24    Calcium 8.6 - 10.2 mg/dL 9.2  9.4    Total Protein 6.0 - 8.5 g/dL 7.4  6.6    Total Bilirubin 0.0 - 1.2 mg/dL 0.3  0.2    Alkaline Phos 44 - 121 IU/L 74  81    AST 0 - 40 IU/L 26  20    ALT 0 - 44 IU/L  36  33     Lipid Panel     Component Value Date/Time   CHOL 147 11/10/2021 0724   TRIG 121 11/10/2021 0724   HDL 34 (L) 11/10/2021 0724   CHOLHDL 4.3 11/10/2021 0724   CHOLHDL 3.6 08/11/2016 1030   VLDL 19 08/11/2016 1030   LDLCALC 91 11/10/2021 0724   LDLDIRECT 164.1 05/04/2013 1107    CBC    Component Value Date/Time   WBC 5.8 11/16/2021 1602   WBC 5.7 12/01/2018 1227   RBC 4.80 11/16/2021 1602   RBC 5.17 12/01/2018 1227   HGB 13.1 11/16/2021 1602   HCT 39.6 11/16/2021 1602   PLT 198 11/16/2021 1602   MCV 83 11/16/2021 1602   MCH 27.3 11/16/2021 1602   MCH 27.7 12/01/2018 1227   MCHC 33.1 11/16/2021 1602   MCHC 31.8 12/01/2018 1227   RDW 12.9 11/16/2021 1602   LYMPHSABS 1.8 12/01/2018 1227   MONOABS 0.6 12/01/2018 1227   EOSABS 0.1 12/01/2018 1227   BASOSABS 0.0 12/01/2018 1227    ASSESSMENT AND PLAN: 1. Essential hypertension At goal.  Continue lisinopril 40 mg daily and metoprolol 25 mg twice a day.  2. Coronary artery disease involving native coronary artery of native heart without angina pectoris Stable.  He will continue metoprolol 25 mg twice a day, rosuvastatin 40 mg daily and aspirin.  Will arrange for him to follow-up with his cardiologist. - CBC - Comprehensive metabolic panel - Lipid panel - Ambulatory referral to Cardiology  3. PAD (peripheral artery disease) (HCC) He has stable claudication symptoms.  He will continue rosuvastatin and aspirin. He will continue his exercise routine.  He goes to the gym to ride stationary bike 3 days a week for 15 minutes. - rosuvastatin (CRESTOR) 40 MG tablet; Take 1 tablet (40 mg total) by mouth daily.  Dispense: 90 tablet; Refill: 3  4. Prediabetes Repeat A1c today again is still in that borderline range.  We will keep him as prediabetes and recheck A1c and fasting blood sugar on next visit.  Commended him on dietary changes and regular exercise.  Printed information given on healthy eating habits. - POCT glucose  (manual entry) - POCT glycosylated hemoglobin (Hb A1C)  5. Chronic low back pain, unspecified back pain laterality, unspecified whether sciatica present Patient requested refill on gabapentin. - gabapentin (NEURONTIN) 300 MG capsule; TAKE 1 CAPSULE EVERY MORNING AND TAKE 2 CAPSULES EVERY EVENING  Dispense: 90 capsule; Refill: 1  6. Obesity (BMI 30.0-34.9) See #4 above.  7. Need for shingles vaccine Patient agreeable to receiving the second shingles vaccine.  Printed prescription given to him to take to his pharmacy.  8. Benign prostatic hyperplasia with urinary frequency Refill Flomax. - PSA  9. Frequency of urination and polyuria - tamsulosin (FLOMAX) 0.4 MG CAPS capsule; Take 1 capsule (0.4 mg total)  by mouth at bedtime.  Dispense: 90 capsule; Refill: 3  10. Prostate cancer screening - PSA  11. Dysuria - Urinalysis, Routine w reflex microscopic - Urine cytology ancillary only  12. History of pulmonary embolism Looks like plan is for lifelong anticoagulation.  He would be a good candidate for Eliquis or Xarelto but reports medications are cost prohibitive even with his insurance.     Patient was given the opportunity to ask questions.  Patient verbalized understanding of the plan and was able to repeat key elements of the plan.   This documentation was completed using Paediatric nurse.  Any transcriptional errors are unintentional.  Orders Placed This Encounter  Procedures   CBC   Comprehensive metabolic panel   Lipid panel   PSA   Urinalysis, Routine w reflex microscopic   Ambulatory referral to Cardiology   POCT glucose (manual entry)   POCT glycosylated hemoglobin (Hb A1C)     Requested Prescriptions   Signed Prescriptions Disp Refills   rosuvastatin (CRESTOR) 40 MG tablet 90 tablet 3    Sig: Take 1 tablet (40 mg total) by mouth daily.   gabapentin (NEURONTIN) 300 MG capsule 90 capsule 1    Sig: TAKE 1 CAPSULE EVERY MORNING AND TAKE 2  CAPSULES EVERY EVENING   metoprolol tartrate (LOPRESSOR) 25 MG tablet 180 tablet 1    Sig: Take 1 tablet (25 mg total) by mouth 2 (two) times daily.   lisinopril (ZESTRIL) 40 MG tablet 90 tablet 1    Sig: Take 1 tablet (40 mg total) by mouth daily.   Zoster Vaccine Adjuvanted [facility]) injection 0.5 mL 0    Sig: Inject 0.5 mLs into the muscle once for 1 dose.   tamsulosin (FLOMAX) 0.4 MG CAPS capsule 90 capsule 3    Sig: Take 1 capsule (0.4 mg total) by mouth at bedtime.    Return in about 6 weeks (around 02/07/2023) for AWV - Medicare Wellness Visit.  Jonah Blue, MD, FACP

## 2022-12-27 NOTE — Patient Instructions (Signed)
Prediabetes Eating Plan Prediabetes is a condition that causes blood sugar (glucose) levels to be higher than normal. This increases the risk for developing type 2 diabetes (type 2 diabetes mellitus). Working with a health care provider or nutrition specialist (dietitian) to make diet and lifestyle changes can help prevent the onset of diabetes. These changes may help you: Control your blood glucose levels. Improve your cholesterol levels. Manage your blood pressure. What are tips for following this plan? Reading food labels Read food labels to check the amount of fat, salt (sodium), and sugar in prepackaged foods. Avoid foods that have: Saturated fats. Trans fats. Added sugars. Avoid foods that have more than 300 milligrams (mg) of sodium per serving. Limit your sodium intake to less than 2,300 mg each day. Shopping Avoid buying pre-made and processed foods. Avoid buying drinks with added sugar. Cooking Cook with olive oil. Do not use butter, lard, or ghee. Bake, broil, grill, steam, or boil foods. Avoid frying. Meal planning  Work with your dietitian to create an eating plan that is right for you. This may include tracking how many calories you take in each day. Use a food diary, notebook, or mobile application to track what you eat at each meal. Consider following a Mediterranean diet. This includes: Eating several servings of fresh fruits and vegetables each day. Eating fish at least twice a week. Eating one serving each day of whole grains, beans, nuts, and seeds. Using olive oil instead of other fats. Limiting alcohol. Limiting red meat. Using nonfat or low-fat dairy products. Consider following a plant-based diet. This includes dietary choices that focus on eating mostly vegetables and fruit, grains, beans, nuts, and seeds. If you have high blood pressure, you may need to limit your sodium intake or follow a diet such as the DASH (Dietary Approaches to Stop Hypertension) eating  plan. The DASH diet aims to lower high blood pressure. Lifestyle Set weight loss goals with help from your health care team. It is recommended that most people with prediabetes lose 7% of their body weight. Exercise for at least 30 minutes 5 or more days a week. Attend a support group or seek support from a mental health counselor. Take over-the-counter and prescription medicines only as told by your health care provider. What foods are recommended? Fruits Berries. Bananas. Apples. Oranges. Grapes. Papaya. Mango. Pomegranate. Kiwi. Grapefruit. Cherries. Vegetables Lettuce. Spinach. Peas. Beets. Cauliflower. Cabbage. Broccoli. Carrots. Tomatoes. Squash. Eggplant. Herbs. Peppers. Onions. Cucumbers. Brussels sprouts. Grains Whole grains, such as whole-wheat or whole-grain breads, crackers, cereals, and pasta. Unsweetened oatmeal. Bulgur. Barley. Quinoa. Brown rice. Corn or whole-wheat flour tortillas or taco shells. Meats and other proteins Seafood. Poultry without skin. Lean cuts of pork and beef. Tofu. Eggs. Nuts. Beans. Dairy Low-fat or fat-free dairy products, such as yogurt, cottage cheese, and cheese. Beverages Water. Tea. Coffee. Sugar-free or diet soda. Seltzer water. Low-fat or nonfat milk. Milk alternatives, such as soy or almond milk. Fats and oils Olive oil. Canola oil. Sunflower oil. Grapeseed oil. Avocado. Walnuts. Sweets and desserts Sugar-free or low-fat pudding. Sugar-free or low-fat ice cream and other frozen treats. Seasonings and condiments Herbs. Sodium-free spices. Mustard. Relish. Low-salt, low-sugar ketchup. Low-salt, low-sugar barbecue sauce. Low-fat or fat-free mayonnaise. The items listed above may not be a complete list of recommended foods and beverages. Contact a dietitian for more information. What foods are not recommended? Fruits Fruits canned with syrup. Vegetables Canned vegetables. Frozen vegetables with butter or cream sauce. Grains Refined white  flour and flour   products, such as bread, pasta, snack foods, and cereals. Meats and other proteins Fatty cuts of meat. Poultry with skin. Breaded or fried meat. Processed meats. Dairy Full-fat yogurt, cheese, or milk. Beverages Sweetened drinks, such as iced tea and soda. Fats and oils Butter. Lard. Ghee. Sweets and desserts Baked goods, such as cake, cupcakes, pastries, cookies, and cheesecake. Seasonings and condiments Spice mixes with added salt. Ketchup. Barbecue sauce. Mayonnaise. The items listed above may not be a complete list of foods and beverages that are not recommended. Contact a dietitian for more information. Where to find more information American Diabetes Association: www.diabetes.org Summary You may need to make diet and lifestyle changes to help prevent the onset of diabetes. These changes can help you control blood sugar, improve cholesterol levels, and manage blood pressure. Set weight loss goals with help from your health care team. It is recommended that most people with prediabetes lose 7% of their body weight. Consider following a Mediterranean diet. This includes eating plenty of fresh fruits and vegetables, whole grains, beans, nuts, seeds, fish, and low-fat dairy, and using olive oil instead of other fats. This information is not intended to replace advice given to you by your health care provider. Make sure you discuss any questions you have with your health care provider. Document Revised: 08/30/2019 Document Reviewed: 08/30/2019 Elsevier Patient Education  2024 [employer]  

## 2022-12-28 ENCOUNTER — Telehealth: Payer: Self-pay | Admitting: Pharmacist

## 2022-12-28 ENCOUNTER — Ambulatory Visit: Payer: Medicare HMO | Attending: Physician Assistant | Admitting: Physician Assistant

## 2022-12-28 ENCOUNTER — Encounter: Payer: Self-pay | Admitting: Physician Assistant

## 2022-12-28 VITALS — BP 130/74 | HR 66 | Ht 69.0 in | Wt 227.0 lb

## 2022-12-28 DIAGNOSIS — I2510 Atherosclerotic heart disease of native coronary artery without angina pectoris: Secondary | ICD-10-CM | POA: Diagnosis not present

## 2022-12-28 DIAGNOSIS — R600 Localized edema: Secondary | ICD-10-CM | POA: Diagnosis not present

## 2022-12-28 DIAGNOSIS — I10 Essential (primary) hypertension: Secondary | ICD-10-CM

## 2022-12-28 DIAGNOSIS — E785 Hyperlipidemia, unspecified: Secondary | ICD-10-CM

## 2022-12-28 DIAGNOSIS — I739 Peripheral vascular disease, unspecified: Secondary | ICD-10-CM | POA: Diagnosis not present

## 2022-12-28 LAB — URINALYSIS, ROUTINE W REFLEX MICROSCOPIC
Bilirubin, UA: NEGATIVE
Glucose, UA: NEGATIVE
Ketones, UA: NEGATIVE
Leukocytes,UA: NEGATIVE
Nitrite, UA: NEGATIVE
Protein,UA: NEGATIVE
Specific Gravity, UA: 1.02 (ref 1.005–1.030)
Urobilinogen, Ur: 0.2 mg/dL (ref 0.2–1.0)
pH, UA: 6 (ref 5.0–7.5)

## 2022-12-28 LAB — LIPID PANEL
Chol/HDL Ratio: 3.5 ratio (ref 0.0–5.0)
Cholesterol, Total: 156 mg/dL (ref 100–199)
HDL: 44 mg/dL (ref >39)
LDL Chol Calc (NIH): 97 mg/dL (ref 0–99)
Triglycerides: 80 mg/dL (ref 0–149)
VLDL Cholesterol Cal: 15 mg/dL (ref 5–40)

## 2022-12-28 LAB — COMPREHENSIVE METABOLIC PANEL
ALT: 32 IU/L (ref 0–44)
AST: 22 IU/L (ref 0–40)
Albumin: 4 g/dL (ref 3.9–4.9)
Alkaline Phosphatase: 118 IU/L (ref 44–121)
BUN/Creatinine Ratio: 18 (ref 10–24)
BUN: 16 mg/dL (ref 8–27)
Bilirubin Total: <0.2 mg/dL (ref 0.0–1.2)
CO2: 20 mmol/L (ref 20–29)
Calcium: 9.2 mg/dL (ref 8.6–10.2)
Chloride: 102 mmol/L (ref 96–106)
Creatinine, Ser: 0.91 mg/dL (ref 0.76–1.27)
Globulin, Total: 3 g/dL (ref 1.5–4.5)
Glucose: 99 mg/dL (ref 70–99)
Potassium: 4.4 mmol/L (ref 3.5–5.2)
Sodium: 138 mmol/L (ref 134–144)
Total Protein: 7 g/dL (ref 6.0–8.5)
eGFR: 95 mL/min/{1.73_m2} (ref >59)

## 2022-12-28 LAB — MICROSCOPIC EXAMINATION
Bacteria, UA: NONE SEEN
Casts: NONE SEEN /lpf
Epithelial Cells (non renal): NONE SEEN /hpf (ref 0–10)
RBC, Urine: NONE SEEN /hpf (ref 0–2)
WBC, UA: NONE SEEN /hpf (ref 0–5)

## 2022-12-28 LAB — CBC
Hematocrit: 43.4 % (ref 37.5–51.0)
Hemoglobin: 14 g/dL (ref 13.0–17.7)
MCH: 26.8 pg (ref 26.6–33.0)
MCHC: 32.3 g/dL (ref 31.5–35.7)
MCV: 83 fL (ref 79–97)
Platelets: 202 10*3/uL (ref 150–450)
RBC: 5.23 x10E6/uL (ref 4.14–5.80)
RDW: 13.3 % (ref 11.6–15.4)
WBC: 5.3 10*3/uL (ref 3.4–10.8)

## 2022-12-28 LAB — PSA: Prostate Specific Ag, Serum: 0.5 ng/mL (ref 0.0–4.0)

## 2022-12-28 NOTE — Progress Notes (Signed)
Cardiology Office Note:  .   Date:  12/28/2022  ID:  [name], DOB 11-03-1960, MRN [id number] PCP: Marcine Matar, MD  [facility] Providers Cardiologist:  Verne Carrow, MD {  History of Present Illness: Michael [name] is a 62 y.o. male with history of CAD, HTN here today for cardiac follow up. He was admitted to [facility] 03/31/11 with anterior STEMI with occluded LAD now s/p DES x 1 proximal LAD. His cath was performed by Dr. Kirke Corin. Diffuse distal disease in the right posterolateral branch. The third OM was occluded and filled from left to left collaterals. Proximal LAD occlusion treated with 2.75 x 24 mm Promus Element DES x 1. He had a severe dye reaction. LV function was preserved by echo.  In August 2019 he had acute on chronic ischemia with severe tibial artery occlusive disease bilaterally leading to a right popliteal and tibial embolectomy with left femoral to below the knee popliteal bypass and left popliteal and tibial endarterectomy.  Also had acute right leg DVT and bilateral pulmonary emboli at that time. He has been on coumadin. Cardiac event monitor in January 2021 with sinus with rare PVCs, rare PACs and 2 short runs of SVT (4-5 beats).    He was seen for follow-up 10/26/21. The patient denied any chest pain, dyspnea, palpitations, lower extremity edema, orthopnea, PND, dizziness, near syncope or syncope. BP has been elevated at home.   Today, he tells me his is playing pickleball now. His BP is  130/74 today on repeat. He is now eating [place], fish and chicken. His A1C was 6.5 and now its 4.0.  His weight is down. He has been going to the gym, doing the weight training and bike three days a week. He is going to add swimming on Friday. He had cholesterol checked by PCP. Everything was good in the normal zone per the patient, no bleeding issues on the coumadin. 2-3 range is his INR range. He would like me to reach out to pharm to discuss xarelto vs.  Eliquis. 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 Leg pain ever since he had the surgery on his legs is unchanged.   Reports no shortness of breath nor dyspnea on exertion. Reports no chest pain, pressure, or tightness. No edema, orthopnea, PND. Reports no palpitations.    ROS: Pertinent ROS in HPI  Studies Reviewed: Marland Kitchen   EKG Interpretation Date/Time:  Tuesday December 28 2022 11:25:38 EDT Ventricular Rate:  66 PR Interval:  214 QRS Duration:  94 QT Interval:  416 QTC Calculation: 436 R Axis:   -72  Text Interpretation: Sinus rhythm with 1st degree A-V block Left axis deviation When compared with ECG of 01-Dec-2018 12:20, PREVIOUS ECG IS PRESENT Confirmed by Jari Favre [phone]) on 12/28/2022 12:18:10 PM   Echo 11/10/21 IMPRESSIONS     1. Global hypokinesis with mid-apical anterolateral and apical anterior  akinesis. Left ventricular ejection fraction, by estimation, is 45 to 50%.  The left ventricle has mildly decreased function. The left ventricle  demonstrates global hypokinesis. The  left ventricular internal cavity size was mildly dilated. There is mild  concentric left ventricular hypertrophy. Left ventricular diastolic  parameters are consistent with Grade II diastolic dysfunction  (pseudonormalization).   2. Right ventricular systolic function is normal. The right ventricular  size is normal.   3. Left atrial size was severely dilated.   4. The mitral valve is normal in structure. Mild mitral valve  regurgitation. No  evidence of mitral stenosis.   5. The aortic valve is tricuspid. Aortic valve regurgitation is not  visualized. No aortic stenosis is present.   6. Aortic dilatation noted. There is mild dilatation of the aortic root  and of the ascending aorta, measuring 36 mm.   7. The inferior vena cava is normal in size with greater than 50%  respiratory variability, suggesting right atrial pressure of 3 mmHg.   FINDINGS   Left Ventricle: Global hypokinesis with mid-apical  anterolateral and  apical anterior akinesis. Left ventricular ejection fraction, by  estimation, is 45 to 50%. The left ventricle has mildly decreased  function. The left ventricle demonstrates global  hypokinesis. The left ventricular internal cavity size was mildly dilated.  There is mild concentric left ventricular hypertrophy. Left ventricular  diastolic parameters are consistent with Grade II diastolic dysfunction  (pseudonormalization).  Indeterminate filling pressures.   Right Ventricle: The right ventricular size is normal. No increase in  right ventricular wall thickness. Right ventricular systolic function is  normal.   Left Atrium: Left atrial size was severely dilated.   Right Atrium: Right atrial size was normal in size.   Pericardium: There is no evidence of pericardial effusion.   Mitral Valve: The mitral valve is normal in structure. Mild mitral valve  regurgitation. No evidence of mitral valve stenosis.   Tricuspid Valve: The tricuspid valve is normal in structure. Tricuspid  valve regurgitation is not demonstrated. No evidence of tricuspid  stenosis.   Aortic Valve: The aortic valve is tricuspid. Aortic valve regurgitation is  not visualized. No aortic stenosis is present.   Pulmonic Valve: The pulmonic valve was normal in structure. Pulmonic valve  regurgitation is not visualized. No evidence of pulmonic stenosis.   Aorta: Aortic dilatation noted. There is mild dilatation of the aortic  root and of the ascending aorta, measuring 36 mm.   Venous: The inferior vena cava is normal in size with greater than 50%  respiratory variability, suggesting right atrial pressure of 3 mmHg.   IAS/Shunts: No atrial level shunt detected by color flow Doppler.        Physical Exam:   VS:  BP 130/74   Pulse 66   Ht 5\' 9"  (1.753 m)   Wt 227 lb (103 kg)   SpO2 97%   BMI 33.52 kg/m    Wt Readings from Last 3 Encounters:  12/28/22 227 lb (103 kg)  12/27/22 228 lb  (103.4 kg)  08/05/22 228 lb (103.4 kg)    GEN: Well nourished, well developed in no acute distress NECK: No JVD; No carotid bruits CARDIAC: RRR, no murmurs, rubs, gallops RESPIRATORY:  Clear to auscultation without rales, wheezing or rhonchi  ABDOMEN: Soft, non-tender, non-distended EXTREMITIES:  No edema; No deformity   ASSESSMENT AND PLAN: .   CAD -no chest pain or SOB -continue current medications, lopressor, asa 81mg , crestor, lisinopril, and coumadin -continue low sodium diet  HTN -BP in range today -continue current medications -continue to monitor at home  HLD -crestor 40mg  daily -PCP manages labs  LE DVT -remains on coumadin and would like to switch to xarelto or Eliquis -we have arranged for pharmD follow-up  PAD -deals with LE pain since right popliteal and tibial embolectomy with left femoral to below the knee popliteal bypass and left popliteal and tibial endarterectomy -he is still able to exercise and does this a few times a week      Dispo: He can follow-up in a year or sooner if needed  Signed, Sharlene Dory, PA-C

## 2022-12-28 NOTE — Telephone Encounter (Signed)
Michael [name]  P Cv Div Pharmd Patient has been referred to see the pharmacist but he want a phone call instead of office visit.  Patient is on coumadin and this visit is to talk about eliquis vs xarelto.  He said he is mainly interested in the cost.  I told him I did not know if this could be a phone call only but I would send a message to the pharmacist and they would call him.  Please call pt at your convenience.

## 2022-12-28 NOTE — Telephone Encounter (Signed)
Called pt, no answer, left message.  He previously took Xarelto, changed to warfarin back in 2020 due to cost.  Pulled up his formulary - Eliquis or Xarelto are $45/month or $125/3 months. Not on any other branded meds so should not be in the donut hole.   On anticoag for prevention of recurrent VTE so dosing would either be Eliquis 2.5mg  BID or Xarelto 10mg  daily. Xarelto may make more sense since he also has CAD and PAD so might get added benefit from vascular indication for Xarelto although VTE dosing is a bit higher than vascular indication dosing.  His INR was low a few days ago, would be ok to take last dose of warfarin today and start DOAC tomorrow.

## 2022-12-28 NOTE — Patient Instructions (Addendum)
Medication Instructions:   Your physician recommends that you continue on your current medications as directed. Please refer to the Current Medication list given to you today.  *If you need a refill on your cardiac medications before your next appointment, please call your pharmacy*   Lab Work: NONE ORDERED  TODAY    If you have labs (blood work) drawn today and your tests are completely normal, you will receive your results only by: MyChart Message (if you have MyChart) OR A paper copy in the mail If you have any lab test that is abnormal or we need to change your treatment, we will call you to review the results.   Testing/Procedures: NONE ORDERED  TODAY     Follow-Up: At [facility], you and your health needs are our priority.  As part of our continuing mission to provide you with exceptional heart care, we have created designated Provider Care Teams.  These Care Teams include your primary Cardiologist (physician) and Advanced Practice Providers (APPs -  Physician Assistants and Nurse Practitioners) who all work together to provide you with the care you need, when you need it.  We recommend signing up for the patient portal called "MyChart".  Sign up information is provided on this After Visit Summary.  MyChart is used to connect with patients for Virtual Visits (Telemedicine).  Patients are able to view lab/test results, encounter notes, upcoming appointments, etc.  Non-urgent messages can be sent to your provider as well.   To learn more about what you can do with MyChart, go to [email or web address].    Your next appointment:  2 WEEKS WITH PHARM D  1 year(s)  Provider:   Verne Carrow, MD     Other Instructions

## 2022-12-29 ENCOUNTER — Other Ambulatory Visit: Payer: Self-pay | Admitting: *Deleted

## 2022-12-29 ENCOUNTER — Other Ambulatory Visit: Payer: Self-pay | Admitting: Internal Medicine

## 2022-12-29 DIAGNOSIS — I2699 Other pulmonary embolism without acute cor pulmonale: Secondary | ICD-10-CM

## 2022-12-29 DIAGNOSIS — I824Y1 Acute embolism and thrombosis of unspecified deep veins of right proximal lower extremity: Secondary | ICD-10-CM

## 2022-12-29 DIAGNOSIS — Z5181 Encounter for therapeutic drug level monitoring: Secondary | ICD-10-CM

## 2022-12-29 LAB — URINE CYTOLOGY ANCILLARY ONLY
Chlamydia: NEGATIVE
Comment: NEGATIVE
Comment: NEGATIVE
Comment: NORMAL
Neisseria Gonorrhea: NEGATIVE
Trichomonas: NEGATIVE

## 2022-12-29 MED ORDER — WARFARIN SODIUM 5 MG PO TABS
ORAL_TABLET | ORAL | 0 refills | Status: AC
Start: 2022-12-29 — End: ?

## 2022-12-29 MED ORDER — EZETIMIBE 10 MG PO TABS: 10.0000 mg | ORAL_TABLET | Freq: Every day | ORAL | 1 refills | Status: DC

## 2022-12-29 NOTE — Telephone Encounter (Signed)
Warfarin 5mg  refill  Last OV 12/29/22 Last INR 12/24/22

## 2022-12-31 NOTE — Telephone Encounter (Signed)
Called pt again. Advised him that DOAC copay is $45/month. Pt does not wish to pay this, prefers to stay on warfarin.

## 2023-01-02 ENCOUNTER — Other Ambulatory Visit: Payer: Self-pay | Admitting: Internal Medicine

## 2023-01-02 DIAGNOSIS — M545 Low back pain, unspecified: Secondary | ICD-10-CM

## 2023-01-03 ENCOUNTER — Telehealth: Payer: Self-pay | Admitting: Internal Medicine

## 2023-01-03 MED ORDER — DICLOFENAC SODIUM 1 % EX GEL: 2.0000 g | Freq: Two times a day (BID) | CUTANEOUS | 1 refills | Status: AC | PRN

## 2023-01-03 NOTE — Addendum Note (Signed)
Addended by: Jonah Blue B on: 01/03/2023 06:27 PM   Modules accepted: Orders

## 2023-01-03 NOTE — Telephone Encounter (Signed)
Patient requesting dicoflenac sodium gel 1%. Medication not on medication List. Please advise.

## 2023-01-03 NOTE — Telephone Encounter (Signed)
Medication Refill - Medication: dicoflenac sodium gel 1%  Has the patient contacted their pharmacy? No/ says he discussed the refill with Dr last week, but mnot showing this request (Agent: If no, request that the patient contact the pharmacy for the refill. If patient does not wish to contact the pharmacy document the reason why and proceed with request.) (Agent: If yes, when and what did the pharmacy advise?)contact pcp  Preferred Pharmacy (with phone number or street name):  [facility] [address] [place], [place] - [address] Phone: [phone]  Fax: [phone]     Has the patient been seen for an appointment in the last year OR does the patient have an upcoming appointment? yes  Agent: Please be advised that RX refills may take up to 3 business days. We ask that you follow-up with your pharmacy.

## 2023-01-04 NOTE — Telephone Encounter (Signed)
Called but no answer. LVM informing patient that prescription has been sent to the pharmacy.

## 2023-01-28 ENCOUNTER — Telehealth: Payer: Self-pay | Admitting: *Deleted

## 2023-01-28 ENCOUNTER — Ambulatory Visit: Payer: Medicare HMO

## 2023-01-28 DIAGNOSIS — R6889 Other general symptoms and signs: Secondary | ICD-10-CM | POA: Diagnosis not present

## 2023-01-28 NOTE — Telephone Encounter (Signed)
Called pt since he missed his appointment today. Was able to get him rescheduled to next week.

## 2023-02-01 ENCOUNTER — Telehealth: Payer: Self-pay

## 2023-02-01 ENCOUNTER — Ambulatory Visit: Payer: Medicare HMO | Attending: Cardiology

## 2023-02-01 DIAGNOSIS — R6889 Other general symptoms and signs: Secondary | ICD-10-CM | POA: Diagnosis not present

## 2023-02-01 NOTE — Telephone Encounter (Signed)
I tried calling patient to reschedule INR appt, but phone is no longer in service.

## 2023-02-02 ENCOUNTER — Other Ambulatory Visit: Payer: Self-pay | Admitting: Pharmacist

## 2023-02-02 DIAGNOSIS — Z79899 Other long term (current) drug therapy: Secondary | ICD-10-CM

## 2023-02-02 NOTE — Progress Notes (Signed)
Pharmacy Quality Measure Review  This patient is appearing on a report for being at risk of failing the adherence measure for cholesterol (statin) and hypertension (ACEi/ARB) medications this calendar year.   Medication:  -Rosuvastatin Last fill date: 01/22/2023 for 90 day supply  -Lisinopril  Last fill date: 12/30/2022 for 90 day supply  Insurance report was not up to date. No action needed at this time. Refills are on file.   Butch Penny, PharmD, Patsy [place], CPP Clinical Pharmacist [facility] [phone]

## 2023-02-09 ENCOUNTER — Other Ambulatory Visit: Payer: Self-pay

## 2023-02-09 DIAGNOSIS — I739 Peripheral vascular disease, unspecified: Secondary | ICD-10-CM

## 2023-02-10 ENCOUNTER — Other Ambulatory Visit: Payer: Self-pay | Admitting: Internal Medicine

## 2023-02-10 DIAGNOSIS — G8929 Other chronic pain: Secondary | ICD-10-CM

## 2023-02-25 ENCOUNTER — Ambulatory Visit (HOSPITAL_COMMUNITY): Payer: Medicare HMO | Attending: Vascular Surgery

## 2023-02-25 ENCOUNTER — Ambulatory Visit: Payer: Medicare HMO

## 2023-03-07 ENCOUNTER — Other Ambulatory Visit: Payer: Self-pay | Admitting: Cardiovascular Disease

## 2023-03-07 DIAGNOSIS — Z5181 Encounter for therapeutic drug level monitoring: Secondary | ICD-10-CM

## 2023-03-07 DIAGNOSIS — I824Y1 Acute embolism and thrombosis of unspecified deep veins of right proximal lower extremity: Secondary | ICD-10-CM

## 2023-03-07 DIAGNOSIS — I2699 Other pulmonary embolism without acute cor pulmonale: Secondary | ICD-10-CM

## 2023-03-07 NOTE — Telephone Encounter (Signed)
Prescription refill request received for warfarin Lov: 12/28/22 Michael [name])  Next INR check: 01/28/23 Warfarin tablet strength: 5mg   INR overdue. Pt has scheduled coumadin clinic on 03/11/23. Called pt and confirmed pt has enough Warfarin until upcoming appt. Will refill Warfarin at appt.

## 2023-03-11 ENCOUNTER — Ambulatory Visit: Payer: Medicare HMO

## 2023-03-11 ENCOUNTER — Encounter (HOSPITAL_COMMUNITY): Payer: Medicare HMO

## 2023-03-17 ENCOUNTER — Other Ambulatory Visit: Payer: Self-pay | Admitting: Cardiovascular Disease

## 2023-03-17 DIAGNOSIS — I824Y1 Acute embolism and thrombosis of unspecified deep veins of right proximal lower extremity: Secondary | ICD-10-CM

## 2023-03-17 DIAGNOSIS — Z5181 Encounter for therapeutic drug level monitoring: Secondary | ICD-10-CM

## 2023-03-17 DIAGNOSIS — I2699 Other pulmonary embolism without acute cor pulmonale: Secondary | ICD-10-CM

## 2023-03-17 NOTE — Telephone Encounter (Signed)
Refill request for warfarin:  Last INR was 1.7 on 12/24/22  (Has appt 03/18/23) LOV was 12/28/22  Refill approved x 1

## 2023-03-18 ENCOUNTER — Ambulatory Visit: Payer: Medicare HMO | Attending: Cardiology

## 2023-03-18 DIAGNOSIS — Z5181 Encounter for therapeutic drug level monitoring: Secondary | ICD-10-CM | POA: Diagnosis not present

## 2023-03-18 DIAGNOSIS — I2699 Other pulmonary embolism without acute cor pulmonale: Secondary | ICD-10-CM

## 2023-03-18 DIAGNOSIS — I824Y1 Acute embolism and thrombosis of unspecified deep veins of right proximal lower extremity: Secondary | ICD-10-CM

## 2023-03-18 DIAGNOSIS — R6889 Other general symptoms and signs: Secondary | ICD-10-CM | POA: Diagnosis not present

## 2023-03-18 LAB — POCT INR: INR: 2.6 (ref 2.0–3.0)

## 2023-03-18 NOTE — Patient Instructions (Signed)
Continue taking Warfarin 1 tablet daily except for 2 tablets on Mondays, Wednesdays and Fridays. Recheck INR in 6 weeks PER PATIENT. Keep dark green leafy veggies consistent in your diet at least 2 times in your diet (collards, mixed greens, cabbage, broccoli, or spinach). Coumadin Clinic [phone] or [phone].

## 2023-03-20 ENCOUNTER — Emergency Department (HOSPITAL_COMMUNITY): Payer: Medicare HMO

## 2023-03-20 ENCOUNTER — Other Ambulatory Visit: Payer: Self-pay

## 2023-03-20 ENCOUNTER — Emergency Department (HOSPITAL_COMMUNITY)
Admission: EM | Admit: 2023-03-20 | Discharge: 2023-03-20 | Disposition: A | Discharge status: Home / Self Care | Payer: Medicare HMO | Attending: Emergency Medicine | Admitting: Emergency Medicine

## 2023-03-20 DIAGNOSIS — Z951 Presence of aortocoronary bypass graft: Secondary | ICD-10-CM | POA: Insufficient documentation

## 2023-03-20 DIAGNOSIS — Z7982 Long term (current) use of aspirin: Secondary | ICD-10-CM | POA: Insufficient documentation

## 2023-03-20 DIAGNOSIS — I2510 Atherosclerotic heart disease of native coronary artery without angina pectoris: Secondary | ICD-10-CM | POA: Insufficient documentation

## 2023-03-20 DIAGNOSIS — R079 Chest pain, unspecified: Secondary | ICD-10-CM | POA: Diagnosis not present

## 2023-03-20 DIAGNOSIS — Z1152 Encounter for screening for COVID-19: Secondary | ICD-10-CM | POA: Insufficient documentation

## 2023-03-20 DIAGNOSIS — K5792 Diverticulitis of intestine, part unspecified, without perforation or abscess without bleeding: Secondary | ICD-10-CM

## 2023-03-20 DIAGNOSIS — R42 Dizziness and giddiness: Secondary | ICD-10-CM | POA: Diagnosis not present

## 2023-03-20 DIAGNOSIS — K4020 Bilateral inguinal hernia, without obstruction or gangrene, not specified as recurrent: Secondary | ICD-10-CM | POA: Diagnosis not present

## 2023-03-20 DIAGNOSIS — Z7901 Long term (current) use of anticoagulants: Secondary | ICD-10-CM | POA: Insufficient documentation

## 2023-03-20 DIAGNOSIS — R0789 Other chest pain: Secondary | ICD-10-CM | POA: Diagnosis not present

## 2023-03-20 DIAGNOSIS — I10 Essential (primary) hypertension: Secondary | ICD-10-CM | POA: Diagnosis not present

## 2023-03-20 DIAGNOSIS — R1032 Left lower quadrant pain: Secondary | ICD-10-CM | POA: Diagnosis not present

## 2023-03-20 DIAGNOSIS — I723 Aneurysm of iliac artery: Secondary | ICD-10-CM | POA: Diagnosis not present

## 2023-03-20 DIAGNOSIS — Z79899 Other long term (current) drug therapy: Secondary | ICD-10-CM | POA: Diagnosis not present

## 2023-03-20 DIAGNOSIS — K5732 Diverticulitis of large intestine without perforation or abscess without bleeding: Secondary | ICD-10-CM | POA: Diagnosis not present

## 2023-03-20 LAB — BASIC METABOLIC PANEL
Anion gap: 14 (ref 5–15)
BUN: 15 mg/dL (ref 8–23)
CO2: 24 mmol/L (ref 22–32)
Calcium: 9.1 mg/dL (ref 8.9–10.3)
Chloride: 98 mmol/L (ref 98–111)
Creatinine, Ser: 1.24 mg/dL (ref 0.61–1.24)
GFR, Estimated: >60 mL/min (ref >60)
Glucose, Bld: 94 mg/dL (ref 70–99)
Potassium: 3.8 mmol/L (ref 3.5–5.1)
Sodium: 136 mmol/L (ref 135–145)

## 2023-03-20 LAB — CBC
HCT: 47.1 % (ref 39.0–52.0)
Hemoglobin: 14.7 g/dL (ref 13.0–17.0)
MCH: 26.5 pg (ref 26.0–34.0)
MCHC: 31.2 g/dL (ref 30.0–36.0)
MCV: 85 fL (ref 80.0–100.0)
Platelets: 202 10*3/uL (ref 150–400)
RBC: 5.54 MIL/uL (ref 4.22–5.81)
RDW: 14.5 % (ref 11.5–15.5)
WBC: 8.4 10*3/uL (ref 4.0–10.5)
nRBC: 0 % (ref 0.0–0.2)

## 2023-03-20 LAB — URINALYSIS, ROUTINE W REFLEX MICROSCOPIC
Bilirubin Urine: NEGATIVE
Glucose, UA: NEGATIVE mg/dL
Ketones, ur: NEGATIVE mg/dL
Leukocytes,Ua: NEGATIVE
Nitrite: NEGATIVE
Protein, ur: 100 mg/dL — AB
Specific Gravity, Urine: 1.021 (ref 1.005–1.030)
pH: 5 (ref 5.0–8.0)

## 2023-03-20 LAB — TROPONIN I (HIGH SENSITIVITY)
Troponin I (High Sensitivity): 13 ng/L (ref <18)
Troponin I (High Sensitivity): 11 ng/L (ref <18)

## 2023-03-20 LAB — PROTIME-INR
INR: 2.8 — ABNORMAL HIGH (ref 0.8–1.2)
Prothrombin Time: 29.8 s — ABNORMAL HIGH (ref 11.4–15.2)

## 2023-03-20 LAB — LIPASE, BLOOD: Lipase: 25 U/L (ref 11–51)

## 2023-03-20 LAB — SARS CORONAVIRUS 2 BY RT PCR: SARS Coronavirus 2 by RT PCR: NEGATIVE

## 2023-03-20 MED ORDER — KETOROLAC TROMETHAMINE 15 MG/ML IJ SOLN
15.0000 mg | Freq: Once | INTRAMUSCULAR | Status: AC
  Administered 2023-03-20: 15 mg via INTRAVENOUS
  Filled 2023-03-20: qty 1

## 2023-03-20 MED ORDER — CIPROFLOXACIN HCL 500 MG PO TABS
500.0000 mg | ORAL_TABLET | Freq: Once | ORAL | Status: AC
  Administered 2023-03-20: 500 mg via ORAL
  Filled 2023-03-20: qty 1

## 2023-03-20 MED ORDER — METRONIDAZOLE 500 MG PO TABS
500.0000 mg | ORAL_TABLET | Freq: Once | ORAL | Status: AC
  Administered 2023-03-20: 500 mg via ORAL
  Filled 2023-03-20: qty 1

## 2023-03-20 MED ORDER — METRONIDAZOLE 500 MG PO TABS: 500.0000 mg | ORAL_TABLET | Freq: Three times a day (TID) | ORAL | 0 refills | Status: AC

## 2023-03-20 MED ORDER — CIPROFLOXACIN HCL 500 MG PO TABS: 500.0000 mg | ORAL_TABLET | Freq: Two times a day (BID) | ORAL | 0 refills | Status: AC

## 2023-03-20 NOTE — ED Provider Notes (Signed)
EMERGENCY DEPARTMENT AT [facility] Provider Note   CSN: [id number] Arrival date & time: 03/20/23  1810     History  Chief Complaint  Patient presents with   Chest Pain   Dizziness    Michael [name] is a 62 y.o. male presented to ED with complaint of chest pain and lightheadedness.  Patient reports that for 2 days he has had episodes of feeling lightheaded or swimmy headed.  He reports he felt that he has been moving sluggishly.  He says he occasionally has a "stitch" in his left lower chest, worse with inspiration.  He says when he was drinking cold water his left arm began shaking, but that is now resolved.  He denies to me that he is having active chest pain or pressure.  He does have a significant history of coronary disease, CABG, cardiac stenting, hypertension and hyperlipidemia, but states that he is not having any of the chest pain he had with prior MIs.  He has been compliant with all of his medications.  HPI     Home Medications Prior to Admission medications   Medication Sig Start Date End Date Taking? Authorizing Provider  ciprofloxacin (CIPRO) 500 MG tablet Take 1 tablet (500 mg total) by mouth 2 (two) times daily for 6 days. 03/21/23 03/27/23 Yes Shereece Wellborn, Kermit Balo, MD  metroNIDAZOLE (FLAGYL) 500 MG tablet Take 1 tablet (500 mg total) by mouth 3 (three) times daily for 6 days. 03/21/23 03/27/23 Yes Terald Sleeper, MD  aspirin EC 81 MG tablet Take 1 tablet (81 mg total) by mouth daily. 06/21/19   Kathleene Hazel, MD  diclofenac Sodium (VOLTAREN) 1 % GEL Apply 2 g topically 2 (two) times daily as needed. 01/03/23   Marcine Matar, MD  ezetimibe (ZETIA) 10 MG tablet Take 1 tablet (10 mg total) by mouth daily. 12/29/22   Marcine Matar, MD  gabapentin (NEURONTIN) 300 MG capsule TAKE 1 CAPSULE EVERY MORNING AND TAKE 2 CAPSULES EVERY EVENING. 02/15/23   Hoy Register, MD  latanoprost (XALATAN) 0.005 % ophthalmic solution  09/19/20   [provider]  lisinopril (ZESTRIL) 40 MG tablet Take 1 tablet (40 mg total) by mouth daily. 12/27/22   Marcine Matar, MD  metoprolol tartrate (LOPRESSOR) 25 MG tablet Take 1 tablet (25 mg total) by mouth 2 (two) times daily. 12/27/22   Marcine Matar, MD  nitroGLYCERIN (NITROSTAT) 0.4 MG SL tablet Place 1 tablet (0.4 mg total) under the tongue every 5 (five) minutes as needed for chest pain. 07/28/20   Kathleene Hazel, MD  rosuvastatin (CRESTOR) 40 MG tablet Take 1 tablet (40 mg total) by mouth daily. 12/27/22   Marcine Matar, MD  tamsulosin (FLOMAX) 0.4 MG CAPS capsule Take 1 capsule (0.4 mg total) by mouth at bedtime. 12/27/22   Marcine Matar, MD  warfarin (COUMADIN) 5 MG tablet TAKE 1 TO 2 TABLETS BY MOUTH ONCE DAILY AS DIRECTED BY COUMADIN CLINIC 03/17/23   Kathleene Hazel, MD      Allergies    Ivp dye [iodinated contrast media]    Review of Systems   Review of Systems  Physical Exam Updated Vital Signs BP (!) 123/94 (BP Location: Left Arm)   Pulse 80   Temp 98.9 F (37.2 C) (Oral)   Resp 18   Ht 5\' 9"  (1.753 m)   Wt 97.5 kg   SpO2 98%   BMI 31.75 kg/m  Physical Exam Constitutional:  General: He is not in acute distress. HENT:     Head: Normocephalic and atraumatic.  Eyes:     Conjunctiva/sclera: Conjunctivae normal.     Pupils: Pupils are equal, round, and reactive to light.  Cardiovascular:     Rate and Rhythm: Normal rate and regular rhythm.  Pulmonary:     Effort: Pulmonary effort is normal. No respiratory distress.  Abdominal:     General: There is no distension.     Tenderness: There is no abdominal tenderness.  Skin:    General: Skin is warm and dry.  Neurological:     General: No focal deficit present.     Mental Status: He is alert. Mental status is at baseline.  Psychiatric:        Mood and Affect: Mood normal.        Behavior: Behavior normal.     ED Results / Procedures / Treatments   Labs (all labs ordered are  listed, but only abnormal results are displayed) Labs Reviewed  URINALYSIS, ROUTINE W REFLEX MICROSCOPIC - Abnormal; Notable for the following components:      Result Value   Color, Urine AMBER (*)    APPearance HAZY (*)    Hgb urine dipstick LARGE (*)    Protein, ur 100 (*)    Bacteria, UA RARE (*)    All other components within normal limits  PROTIME-INR - Abnormal; Notable for the following components:   Prothrombin Time 29.8 (*)    INR 2.8 (*)    All other components within normal limits  SARS CORONAVIRUS 2 BY RT PCR  BASIC METABOLIC PANEL  CBC  LIPASE, BLOOD  TROPONIN I (HIGH SENSITIVITY)  TROPONIN I (HIGH SENSITIVITY)    EKG EKG Interpretation Date/Time:  Sunday March 20 2023 18:10:45 EDT Ventricular Rate:  79 PR Interval:  180 QRS Duration:  96 QT Interval:  396 QTC Calculation: 454 R Axis:   -59  Text Interpretation: Sinus rhythm with occasional Premature ventricular complexes and Fusion complexes Left axis deviation Abnormal ECG When compared with ECG of 28-Dec-2022 11:25, PREVIOUS ECG IS PRESENT No significant changes Confirmed by Alvester Chou [phone]) on 03/20/2023 6:40:09 PM  Radiology CT ABDOMEN PELVIS WO CONTRAST  Result Date: 03/20/2023 CLINICAL DATA:  Left lower quadrant abdominal pain, diverticulitis. EXAM: CT ABDOMEN AND PELVIS WITHOUT CONTRAST TECHNIQUE: Multidetector CT imaging of the abdomen and pelvis was performed following the standard protocol without IV contrast. RADIATION DOSE REDUCTION: This exam was performed according to the departmental dose-optimization program which includes automated exposure control, adjustment of the mA and/or kV according to patient size and/or use of iterative reconstruction technique. COMPARISON:  None Available. FINDINGS: Lower chest: No acute abnormality. Moderate right coronary artery calcification Hepatobiliary: No focal liver abnormality is seen. No gallstones, gallbladder wall thickening, or biliary dilatation.  Pancreas: Unremarkable Spleen: Unremarkable Adrenals/Urinary Tract: Adrenal glands are unremarkable. Kidneys are normal, without renal calculi, focal lesion, or hydronephrosis. Bladder is unremarkable. Stomach/Bowel: Severe pancolonic diverticulosis. Superimposed pericolonic inflammatory stranding involving the proximal descending colon in keeping with changes of acute uncomplicated descending colonic diverticulitis. Small amount of layering fluid within the left pericolic gutter. No loculated intra-abdominal fluid collections. No free intraperitoneal gas. No evidence of obstruction. Stomach, small bowel, and large bowel are otherwise unremarkable. Appendix normal. Vascular/Lymphatic: Extensive aortoiliac atherosclerotic calcification. 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 No aortic aneurysm. No pathologic adenopathy within the abdomen and pelvis. Reproductive: Prostate is unremarkable. Other: Mild diastasis of the rectus abdominus musculature. Small bilateral  fat containing inguinal hernias Musculoskeletal: Degenerative changes are seen within the lumbar spine. No acute bone abnormality. IMPRESSION: 1. Acute uncomplicated descending colonic diverticulitis. 2. 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 3. Moderate right coronary artery calcification. Aortic Atherosclerosis (ICD10-I70.0). Electronically Signed   By: Helyn Numbers M.D.   On: 03/20/2023 21:38   DG Chest 2 View  Result Date: 03/20/2023 CLINICAL DATA:  Chest pain.  Dizziness. EXAM: CHEST - 2 VIEW COMPARISON:  01/23/2018 FINDINGS: Stable upper normal heart size. Unchanged mediastinal contours. No focal airspace disease. No pleural effusion or pneumothorax. Normal pulmonary vasculature. Benign calcified granuloma at the right lung base. No acute osseous findings. IMPRESSION: No acute chest findings. Electronically Signed   By: Narda Rutherford M.D.   On: 03/20/2023 18:53    Procedures Procedures    Medications Ordered in  ED Medications  ketorolac (TORADOL) 15 MG/ML injection 15 mg (has no administration in time range)  ciprofloxacin (CIPRO) tablet 500 mg (has no administration in time range)  metroNIDAZOLE (FLAGYL) tablet 500 mg (has no administration in time range)    ED Course/ Medical Decision Making/ A&P                                 Medical Decision Making Amount and/or Complexity of Data Reviewed Labs: ordered. Radiology: ordered.  Risk Prescription drug management.   This patient presents to the ED with concern for left lower chest pain, fatigue. This involves an extensive number of treatment options, and is a complaint that carries with it a high risk of complications and morbidity.  The differential diagnosis includes ACS versus pneumonia versus pneumothorax versus anemia versus viral illness versus other  Co-morbidities that complicate the patient evaluation: History cardiovascular disease, cardiovascular risk factors, risk of ACS  External records from outside source obtained and reviewed including cardiology evaluation from January of this year noting a history of STEMI in 2012, DVT and bilateral PE, on Coumadin, occasional SVT.  I ordered and personally interpreted labs.  The pertinent results include: No emergent findings.  INR level is 2.9 which is therapeutic.  White blood cell count within normal limits  I ordered imaging studies including x-ray of the chest, CT abdomen pelvis I independently visualized and interpreted imaging which showed acute uncomplicated diverticulitis I agree with the radiologist interpretation  The patient was maintained on a cardiac monitor.  I personally viewed and interpreted the cardiac monitored which showed an underlying rhythm of: Regular heart rate  Per my interpretation the patient's ECG shows normal sinus rhythm no acute ischemic findings  I ordered medication including ciprofloxacin and Flagyl for acute diverticulitis  I have reviewed the  patients home medicines and have made adjustments as needed  Test Considered: Doubt AAA or mesenteric ischemia, no indication for CT angiogram imaging  After the interventions noted above, I reevaluated the patient and found that they have: improved   Dispostion:  After consideration of the diagnostic results and the patients response to treatment, I feel that the patent would benefit from close outpatient follow-up.  We did discuss the fact that the suggestion antibiotics including Augmentin, as well as ciprofloxacin and Flagyl for diverticulitis, both can affect his Coumadin levels or increase his INR.  I strongly encouraged the patient to call his doctor's office to schedule follow-up appointment into the week for symptom recheck and an INR recheck.  But he is otherwise stable at this time for outpatient  management.  He is tolerating p.o. easily in the ED.  I do not see indication for emergent hospitalization.         Final Clinical Impression(s) / ED Diagnoses Final diagnoses:  Diverticulitis    Rx / DC Orders ED Discharge Orders          Ordered    ciprofloxacin (CIPRO) 500 MG tablet  2 times daily        03/20/23 2207    metroNIDAZOLE (FLAGYL) 500 MG tablet  3 times daily        03/20/23 2207              Terald Sleeper, MD 03/20/23 2209

## 2023-03-20 NOTE — Discharge Instructions (Addendum)
You will need to schedule follow-up appointment your primary care doctor at the end of the week to recheck your symptoms, and also to check your INR Coumadin level.  You are prescribed antibiotics for inflammation of your bowels.  These antibiotics can affect your Coumadin levels, potentially increase these levels and cause your blood to be thinner.  It is important that you follow-up with your doctor to recheck these numbers.

## 2023-03-20 NOTE — ED Triage Notes (Signed)
Pt reports feeling dizzy for the last day and half, pt reports some chest pain radiating to right arm. Pt denies numbness, weakness, slurred speech. Pt with good medication compliance. VSS, NAD at present.

## 2023-03-20 NOTE — ED Notes (Signed)
Patient transported to X-ray 

## 2023-03-28 ENCOUNTER — Ambulatory Visit: Payer: Medicare HMO | Attending: Cardiology | Admitting: *Deleted

## 2023-03-28 DIAGNOSIS — I824Y1 Acute embolism and thrombosis of unspecified deep veins of right proximal lower extremity: Secondary | ICD-10-CM | POA: Diagnosis not present

## 2023-03-28 DIAGNOSIS — I2699 Other pulmonary embolism without acute cor pulmonale: Secondary | ICD-10-CM

## 2023-03-28 DIAGNOSIS — R6889 Other general symptoms and signs: Secondary | ICD-10-CM | POA: Diagnosis not present

## 2023-03-28 DIAGNOSIS — Z5181 Encounter for therapeutic drug level monitoring: Secondary | ICD-10-CM

## 2023-03-28 LAB — PROTIME-INR
INR: 6.6 (ref 0.9–1.2)
INR: 6.6 — AB (ref 0.80–1.20)
Prothrombin Time: 65 s — ABNORMAL HIGH (ref 9.1–12.0)

## 2023-03-28 LAB — POCT INR: INR: 8 — AB (ref 2.0–3.0)

## 2023-03-28 NOTE — Patient Instructions (Signed)
Description   Spoke with pt and advised do not take any warfarin today, no warfarin tomorrow, no warfarin Wednesday then continue  Warfarin 1 tablet daily except for 2 tablets on Mondays, Wednesdays and Fridays. Report to ER with any bleeding, falls, accidents. Recheck INR in 1 week. Have some leafy veggies today and keep dark green leafy veggies consistent in your diet at least 2 times in your diet (collards, mixed greens, cabbage, broccoli, or spinach). [facility] [phone] or [phone].

## 2023-03-29 ENCOUNTER — Ambulatory Visit: Payer: Medicare HMO | Attending: Internal Medicine

## 2023-03-29 VITALS — Ht 69.0 in | Wt 215.0 lb

## 2023-03-29 DIAGNOSIS — Z0000 Encounter for general adult medical examination without abnormal findings: Secondary | ICD-10-CM

## 2023-03-29 NOTE — Patient Instructions (Signed)
Michael [name] , Thank you for taking time to come for your Medicare Wellness Visit. I appreciate your ongoing commitment to your health goals. Please review the following plan we discussed and let me know if I can assist you in the future.   Referrals/Orders/Follow-Ups/Clinician Recommendations: Aim for 30 minutes of exercise or brisk walking, 6-8 glasses of water, and 5 servings of fruits and vegetables each day.  This is a list of the screening recommended for you and due dates:  Health Maintenance  Topic Date Due   Zoster (Shingles) Vaccine (2 of 2) 08/14/2021   COVID-19 Vaccine (2 - Pfizer risk series) 06/17/2022   Flu Shot  Never done   Medicare Annual Wellness Visit  03/28/2024   Colon Cancer Screening  08/15/2024   DTaP/Tdap/Td vaccine (2 - Td or Tdap) 12/23/2031   Hepatitis C Screening  Completed   HIV Screening  Completed   HPV Vaccine  Aged Out    Advanced directives: (ACP Link)Information on Advanced Care Planning can be found at [facility] Advance Health Care Directives Advance Health Care Directives ([email or web address]   Next Medicare Annual Wellness Visit scheduled for next year: Yes

## 2023-03-29 NOTE — Progress Notes (Signed)
Subjective:   Michael [name] is a 62 y.o. male who presents for Medicare Annual/Subsequent preventive examination.  Visit Complete: Virtual I connected with  [name] on 03/29/23 by a audio enabled telemedicine application and verified that I am speaking with the correct person using two identifiers.  Patient Location: Home  Provider Location: Home Office  I discussed the limitations of evaluation and management by telemedicine. The patient expressed understanding and agreed to proceed.  Vital Signs: Because this visit was a virtual/telehealth visit, some criteria may be missing or patient reported. Any vitals not documented were not able to be obtained and vitals that have been documented are patient reported.  Cardiac Risk Factors include: advanced age (>69men, >39 women);male gender;hypertension;sedentary lifestyle;dyslipidemia     Objective:    Today's Vitals   03/29/23 1712  Weight: 215 lb (97.5 kg)  Height: 5\' 9"  (1.753 m)   Body mass index is 31.75 kg/m.     03/29/2023    5:16 PM 03/20/2023    6:27 PM 02/24/2022   11:16 AM 12/22/2021   11:06 AM 06/26/2021    1:32 PM 12/29/2020    1:10 PM 03/09/2018   10:34 AM  Advanced Directives  Does Patient Have a Medical Advance Directive? No No No No No No No  Would patient like information on creating a medical advance directive? Yes (MAU/Ambulatory/Procedural Areas - Information given) No - Patient declined No - Patient declined No - Patient declined Yes (Inpatient - patient defers creating a medical advance directive at this time - Information given) No - Patient declined     Current Medications (verified) Outpatient Encounter Medications as of 03/29/2023  Medication Sig   aspirin EC 81 MG tablet Take 1 tablet (81 mg total) by mouth daily.   diclofenac Sodium (VOLTAREN) 1 % GEL Apply 2 g topically 2 (two) times daily as needed.   ezetimibe (ZETIA) 10 MG tablet Take 1 tablet (10 mg total) by mouth daily.    gabapentin (NEURONTIN) 300 MG capsule TAKE 1 CAPSULE EVERY MORNING AND TAKE 2 CAPSULES EVERY EVENING.   latanoprost (XALATAN) 0.005 % ophthalmic solution    lisinopril (ZESTRIL) 40 MG tablet Take 1 tablet (40 mg total) by mouth daily.   metoprolol tartrate (LOPRESSOR) 25 MG tablet Take 1 tablet (25 mg total) by mouth 2 (two) times daily.   nitroGLYCERIN (NITROSTAT) 0.4 MG SL tablet Place 1 tablet (0.4 mg total) under the tongue every 5 (five) minutes as needed for chest pain.   rosuvastatin (CRESTOR) 40 MG tablet Take 1 tablet (40 mg total) by mouth daily.   tamsulosin (FLOMAX) 0.4 MG CAPS capsule Take 1 capsule (0.4 mg total) by mouth at bedtime.   warfarin (COUMADIN) 5 MG tablet TAKE 1 TO 2 TABLETS BY MOUTH ONCE DAILY AS DIRECTED BY COUMADIN CLINIC   No facility-administered encounter medications on file as of 03/29/2023.    Allergies (verified) Ivp dye [iodinated contrast media]   History: Past Medical History:  Diagnosis Date   Alcohol abuse    Anterior myocardial infarction [facility])    ST-elevation; S/P emergent  drug-eluting stenting of proximal left anterior descending   Coronary artery disease    a. STEMI 2012 - s/p DES to LAD with  diffuse distal disease in the right posterolateral branch. The third OM was occluded and filled from left to left collaterals. LVEF preserved.   DVT complicating pregnancy    Dysuria 04/26/2013   Essential hypertension 05/16/2014   Frequent urination 09/03/2014  HNP (herniated nucleus pulposus), cervical 10/14/2014   Hyperlipidemia    Hypertension    Ischemia of extremity 01/23/2018   Lower abdominal pain 09/03/2014   PAD (peripheral artery disease) (HCC)    Pre-diabetes    Premature atrial contraction    PSVT (paroxysmal supraventricular tachycardia) (HCC)    2 short runs on monitor 06/2019   Pulmonary emboli (HCC)    PVC's (premature ventricular contractions)    Renal insufficiency    Special screening for malignant neoplasms, colon    Past  Surgical History:  Procedure Laterality Date   COLONOSCOPY N/A 08/16/2014   Procedure: COLONOSCOPY;  Surgeon: West Bali, MD;  Location: [facility];  Service: Endoscopy;  Laterality: N/A;  10:15 AM   DOPPLER ECHOCARDIOGRAPHY     Preserved left venticular function   EMBOLECTOMY Right 01/23/2018   Procedure: RIGHT POPLITEAL-TIBIAL  ARTERY EMBOLECTOMY, EXPLORATION OF RIGHT ANTERIOR TIBIAL ARTERY, RIGHT POPLITEAL ARTERY VEIN ANGIOPLASTY;  Surgeon: Sherren Kerns, MD;  Location: [facility] OR;  Service: Vascular;  Laterality: Right;   ENDARTERECTOMY POPLITEAL Left 01/23/2018   Procedure: ENDARTERECTOMY OF LEFT POPLITEAL ARTERY AND TIBIAL-PERONEAL TRUNK;  Surgeon: Sherren Kerns, MD;  Location[facility];  Service: Vascular;  Laterality: Left;   FEMORAL-POPLITEAL BYPASS GRAFT Left 01/23/2018   Procedure: BYPASS GRAFT LEFT FEMORAL-POPLITEAL ARTERY WITH PROPATEN VASCULAR GRAFT;  Surgeon: Sherren Kerns, MD;  Location: [facility] OR;  Service: Vascular;  Laterality: Left;   HEMATOMA EVACUATION Left 01/25/2018   Procedure: EVACUATION HEMATOMA LEFT POPLITEAL SPACE;  Surgeon: Cephus Shelling, MD;  Location: [facility] OR;  Service: Vascular;  Laterality: Left;   LEG SURGERY     left leg - has rod and 4 pins   PATELLAR TENDON REPAIR Right 04/26/2014   Procedure: RIGHT PATELLA TENDON REPAIR;  Surgeon: Sheral Apley, MD;  Location: [facility];  Service: Orthopedics;  Laterality: Right;   Family History  Problem Relation Age of Onset   Cancer Maternal Aunt    Hyperlipidemia Other    Diabetes Other    Heart disease Other    Social History   Socioeconomic History   Marital status: Significant Other    Spouse name: Not on file   Number of children: 0   Years of education: Not on file   Highest education level: Not on file  Occupational History   Not on file  Tobacco Use   Smoking status: Never   Smokeless tobacco: Never  Substance and Sexual Activity   Alcohol use: Yes    Comment: Drinks  occassionally   Drug use: No   Sexual activity: Not on file  Other Topics Concern   Not on file  Social History Narrative   Not on file   Social Determinants of Health   Financial Resource Strain: Low Risk  (03/29/2023)   Overall Financial Resource Strain (CARDIA)    Difficulty of Paying Living Expenses: Not hard at all  Food Insecurity: No Food Insecurity (03/29/2023)   Hunger Vital Sign    Worried About Running Out of Food in the Last Year: Never true    Ran Out of Food in the Last Year: Never true  Transportation Needs: No Transportation Needs (03/29/2023)   PRAPARE - Administrator, Civil Service (Medical): No    Lack of Transportation (Non-Medical): No  Physical Activity: Inactive (03/29/2023)   Exercise Vital Sign    Days of Exercise per Week: 0 days    Minutes of Exercise per Session: 0 min  Stress: No Stress Concern Present (03/29/2023)   [employer] of Occupational Health - Occupational Stress Questionnaire    Feeling of Stress : Not at all  Social Connections: Unknown (03/29/2023)   Social Connection and Isolation Panel [NHANES]    Frequency of Communication with Friends and Family: More than three times a week    Frequency of Social Gatherings with Friends and Family: Three times a week    Attends Religious Services: 1 to 4 times per year    Active Member of Clubs or Organizations: No    Attends Engineer, structural: Never    Marital Status: Patient declined    Tobacco Counseling Counseling given: Not Answered   Clinical Intake:  Pre-visit preparation completed: Yes  Pain : No/denies pain  Diabetes: No  How often do you need to have someone help you when you read instructions, pamphlets, or other written materials from your doctor or pharmacy?: 1 - Never  Interpreter Needed?: No  Information entered by :: Kandis Fantasia LPN   Activities of Daily Living    03/29/2023    5:13 PM  In your present state of health, do you  have any difficulty performing the following activities:  Hearing? 0  Vision? 0  Difficulty concentrating or making decisions? 0  Walking or climbing stairs? 0  Dressing or bathing? 0  Doing errands, shopping? 0  Preparing Food and eating ? N  Using the Toilet? N  In the past six months, have you accidently leaked urine? N  Do you have problems with loss of bowel control? N  Managing your Medications? N  Managing your Finances? N  Housekeeping or managing your Housekeeping? N    Patient Care Team: Marcine Matar, MD as PCP - General (Internal Medicine) Kathleene Hazel, MD as PCP - Cardiology (Cardiology)  Indicate any recent Medical Services you may have received from other than [facility] providers in the past year (date may be approximate).     Assessment:   This is a routine wellness examination for [place].  Hearing/Vision screen Hearing Screening - Comments:: Denies hearing difficulties   Vision Screening - Comments:: No vision problems; will schedule routine eye exam soon     Goals Addressed             This Visit's Progress    DIET - EAT MORE FRUITS AND VEGETABLES        Depression Screen    03/29/2023    5:14 PM 12/27/2022   10:45 AM 08/05/2022    2:29 PM 12/22/2021   11:11 AM 06/26/2021    1:33 PM 12/04/2020    8:57 AM 11/13/2020    8:42 AM  PHQ 2/9 Scores  PHQ - 2 Score 0 0 0 0 0 0 0  PHQ- 9 Score  1 0        Fall Risk    03/29/2023    5:20 PM 12/27/2022   10:42 AM 08/05/2022    2:29 PM 12/22/2021   11:10 AM 11/10/2021    2:56 PM  Fall Risk   Falls in the past year? 0 0 0 0 0  Number falls in past yr: 0 0 0 0 0  Injury with Fall? 0 0 0 0 0  Risk for fall due to : No Fall Risks No Fall Risks No Fall Risks Impaired balance/gait;Orthopedic patient No Fall Risks  Follow up Falls prevention discussed;Education provided;Falls evaluation completed  Falls evaluation completed Falls evaluation completed;Education provided;Falls prevention discussed  MEDICARE RISK AT HOME: Medicare Risk at Home Any stairs in or around the home?: No If so, are there any without handrails?: No Home free of loose throw rugs in walkways, pet beds, electrical cords, etc?: Yes Adequate lighting in your home to reduce risk of falls?: Yes Life alert?: No Use of a cane, walker or w/c?: No Grab bars in the bathroom?: Yes Shower chair or bench in shower?: No Elevated toilet seat or a handicapped toilet?: Yes  TIMED UP AND GO:  Was the test performed?  No    Cognitive Function:    12/22/2021   11:13 AM 06/26/2021    1:35 PM  MMSE - Mini Mental State Exam  Orientation to time 5 5  Orientation to Place 5 5  Registration 3 3  Attention/ Calculation 5 5  Recall 2 3  Language- name 2 objects 2 2  Language- repeat 1 1  Language- follow 3 step command 3 3  Language- read & follow direction 1 1  Write a sentence 1 1  Copy design 1 1  Total score 29 30        03/29/2023    5:20 PM  6CIT Screen  What Year? 0 points  What month? 0 points  What time? 0 points  Count back from 20 0 points  Months in reverse 0 points  Repeat phrase 0 points  Total Score 0 points    Immunizations Immunization History  Administered Date(s) Administered   PFIZER Comirnaty(Gray Top)Covid-19 Tri-Sucrose Vaccine 05/27/2022   Rabies, IM 02/24/2022, 02/27/2022, 03/03/2022, 03/10/2022   Tdap 12/22/2021   Zoster Recombinant(Shingrix) 06/19/2021    TDAP status: Up to date  Flu Vaccine status: Due, Education has been provided regarding the importance of this vaccine. Advised may receive this vaccine at local pharmacy or Health Dept. Aware to provide a copy of the vaccination record if obtained from local pharmacy or Health Dept. Verbalized acceptance and understanding.  Pneumococcal vaccine status: Up to date  Covid-19 vaccine status: Information provided on how to obtain vaccines.   Qualifies for Shingles Vaccine? Yes   Zostavax completed No   Shingrix  Completed?: No.    Education has been provided regarding the importance of this vaccine. Patient has been advised to call insurance company to determine out of pocket expense if they have not yet received this vaccine. Advised may also receive vaccine at local pharmacy or Health Dept. Verbalized acceptance and understanding.  Screening Tests Health Maintenance  Topic Date Due   Zoster Vaccines- Shingrix (2 of 2) 08/14/2021   COVID-19 Vaccine (2 - Pfizer risk series) 06/17/2022   INFLUENZA VACCINE  Never done   Medicare Annual Wellness (AWV)  03/28/2024   Colonoscopy  08/15/2024   DTaP/Tdap/Td (2 - Td or Tdap) 12/23/2031   Hepatitis C Screening  Completed   HIV Screening  Completed   HPV VACCINES  Aged Out    Health Maintenance  Health Maintenance Due  Topic Date Due   Zoster Vaccines- Shingrix (2 of 2) 08/14/2021   COVID-19 Vaccine (2 - Pfizer risk series) 06/17/2022   INFLUENZA VACCINE  Never done    Colorectal cancer screening: Type of screening: Colonoscopy. Completed 08/16/14. Repeat every 10 years  Lung Cancer Screening: (Low Dose CT Chest recommended if Age 80-80 years, 20 pack-year currently smoking OR have quit w/in 15years.) does not qualify.   Lung Cancer Screening Referral: n/a  Additional Screening:  Hepatitis C Screening: does qualify; Completed 03/23/17  Vision Screening: Recommended annual ophthalmology exams  for early detection of glaucoma and other disorders of the eye. Is the patient up to date with their annual eye exam?  No  Who is the provider or what is the name of the office in which the patient attends annual eye exams? none If pt is not established with a provider, would they like to be referred to a provider to establish care? No .   Dental Screening: Recommended annual dental exams for proper oral hygiene  Community Resource Referral / Chronic Care Management: CRR required this visit?  No   CCM required this visit?  No     Plan:     I have  personally reviewed and noted the following in the patient's chart:   Medical and social history Use of alcohol, tobacco or illicit drugs  Current medications and supplements including opioid prescriptions. Patient is not currently taking opioid prescriptions. Functional ability and status Nutritional status Physical activity Advanced directives List of other physicians Hospitalizations, surgeries, and ER visits in previous 12 months Vitals Screenings to include cognitive, depression, and falls Referrals and appointments  In addition, I have reviewed and discussed with patient certain preventive protocols, quality metrics, and best practice recommendations. A written personalized care plan for preventive services as well as general preventive health recommendations were provided to patient.     Kandis Fantasia [place], [place]   [id number]   After Visit Summary: (MyChart) Due to this being a telephonic visit, the after visit summary with patients personalized plan was offered to patient via MyChart   Nurse Notes: No concerns at this time

## 2023-04-04 ENCOUNTER — Ambulatory Visit: Payer: Medicare HMO | Attending: Cardiovascular Disease

## 2023-04-04 DIAGNOSIS — R6889 Other general symptoms and signs: Secondary | ICD-10-CM | POA: Diagnosis not present

## 2023-04-11 ENCOUNTER — Ambulatory Visit: Payer: Self-pay | Admitting: *Deleted

## 2023-04-11 NOTE — Telephone Encounter (Signed)
  Chief Complaint: exposure to gonorrhea and now having sx burning with urination and irritation" Symptoms: see above, partner notified patient they were positive for gonorrhea Frequency: over weekend exposed last week  Pertinent Negatives: Patient denies fever "breakout" Disposition: [] ED /[] Urgent Care (no appt availability in office) / [] Appointment(In office/virtual)/ []  [facility]/ [] Home Care/ [x] Refused Recommended Disposition /[] [facility]/ []  Follow-up with PCP Additional Notes:  No available appt with PCP or other providers until 04/27/23. Recommended mobile bus. Patient reports he has transportation issues and takes greater than 3 days to schedule. Can he get medication please advise     Reason for Disposition  Patient is worried they have a sexually transmitted infection (STI)  Answer Assessment - Initial Assessment Questions 1. STI EXPOSURE: "Were you exposed to an STI?" If so, ask: "What kind of contact did you have?" "When did that happen?"     Last week but burning sensation over the weekend 2. TYPE of STI: "Do you know what type of STI (their diagnosis) the other person has?"     Gonohreea  3. STI SYMPTOMS: "Do you have any symptoms of a STI?" If so, ask: "What are they?"     Yes burning irritation when urination  4. QUESTIONS ABOUT STIs:   "Do you have questions about a particular STI?"     Get tested or medication  5. PREVENTION: "Do you have questions about preventing AIDS and other STIs?"     Na  Answer Assessment - Initial Assessment Questions 1. MAIN CONCERN: "What were you exposed to?"  "What sexually transmitted infection (STI) does your sex partner have?" (e.g., gonorrhea, herpes, HIV, pubic lice)     gonorrhea 2. ROUTE of EXPOSURE: "How were you exposed to the STI?" (e.g., oral, vaginal, or rectal intercourse)     Intercourse  3. DATE of EXPOSURE: "When did the exposure occur?" (e.g., days)     Last week  4. SYMPTOMS: "Do you have  any symptoms?" (e.g., pain with urination, rash, sores)     Pain with urination , burning and "irritation". Sx started over weekend 5. PREGNANCY: "Is there any chance you are pregnant?" "When was your last menstrual period?"     na  Protocols used: STI Exposure or Questions-P-AH, STI Exposure-A-AH

## 2023-04-12 NOTE — Telephone Encounter (Signed)
Pt has been placed on the walkin schedule.

## 2023-04-14 ENCOUNTER — Ambulatory Visit: Payer: Medicare HMO | Attending: Internal Medicine | Admitting: Family Medicine

## 2023-04-14 ENCOUNTER — Encounter: Payer: Self-pay | Admitting: Family Medicine

## 2023-04-14 ENCOUNTER — Other Ambulatory Visit (HOSPITAL_COMMUNITY)
Admission: RE | Admit: 2023-04-14 | Discharge: 2023-04-14 | Disposition: A | Discharge status: Home / Self Care | Payer: Medicare HMO | Source: Ambulatory Visit | Attending: Family Medicine | Admitting: Family Medicine

## 2023-04-14 VITALS — BP 128/79 | HR 55 | Ht 69.0 in | Wt 219.8 lb

## 2023-04-14 DIAGNOSIS — Z202 Contact with and (suspected) exposure to infections with a predominantly sexual mode of transmission: Secondary | ICD-10-CM

## 2023-04-14 DIAGNOSIS — R6889 Other general symptoms and signs: Secondary | ICD-10-CM | POA: Diagnosis not present

## 2023-04-14 DIAGNOSIS — R319 Hematuria, unspecified: Secondary | ICD-10-CM | POA: Diagnosis not present

## 2023-04-14 LAB — POCT URINALYSIS DIP (CLINITEK)
Bilirubin, UA: NEGATIVE
Glucose, UA: NEGATIVE mg/dL
Ketones, POC UA: NEGATIVE mg/dL
Leukocytes, UA: NEGATIVE
Nitrite, UA: NEGATIVE
POC PROTEIN,UA: NEGATIVE
Spec Grav, UA: 1.025 (ref 1.010–1.025)
Urobilinogen, UA: 0.2 U/dL
pH, UA: 5.5 (ref 5.0–8.0)

## 2023-04-14 MED ORDER — CEFTRIAXONE SODIUM 500 MG IJ SOLR
500.0000 mg | Freq: Once | INTRAMUSCULAR | Status: AC
Start: 2023-04-14 — End: 2023-04-14
  Administered 2023-04-14: 500 mg via INTRAMUSCULAR

## 2023-04-14 NOTE — Addendum Note (Signed)
Addended by: Arbie Cookey on: 04/14/2023 11:54 AM   Modules accepted: Orders

## 2023-04-14 NOTE — Progress Notes (Signed)
Subjective:  Patient ID: Michael [name], male    DOB: September 20, 1960  Age: 62 y.o. MRN: [id number]  CC: Dysuria (Onset of pain 10/26. STD exposure from partner. ) and Exposure to STD   HPI Michael [name] is a 62 y.o. year old male patient of Dr. Laural Benes with a history of CAD, PAD, hypertension, PE/DVT, hyperlipidemia, CVA, prediabetes. He presents for an acute visit today.  Interval History: Discussed the use of AI scribe software for clinical note transcription with the patient, who gave verbal consent to proceed.  He presents for evaluation of possible sexually transmitted disease (STD) exposure. He reports that a recent sexual partner disclosed a diagnosis of gonorrhea, prompting the patient to seek testing. He denies any symptoms prior to this disclosure, but over the weekend, he began experiencing a burning sensation and intermittent pain. He denies any discharge or blood from the penis.        Past Medical History:  Diagnosis Date   Alcohol abuse    Anterior myocardial infarction [facility])    ST-elevation; S/P emergent  drug-eluting stenting of proximal left anterior descending   Coronary artery disease    a. STEMI 2012 - s/p DES to LAD with  diffuse distal disease in the right posterolateral branch. The third OM was occluded and filled from left to left collaterals. LVEF preserved.   DVT complicating pregnancy    Dysuria 04/26/2013   Essential hypertension 05/16/2014   Frequent urination 09/03/2014   HNP (herniated nucleus pulposus), cervical 10/14/2014   Hyperlipidemia    Hypertension    Ischemia of extremity 01/23/2018   Lower abdominal pain 09/03/2014   PAD (peripheral artery disease) (HCC)    Pre-diabetes    Premature atrial contraction    PSVT (paroxysmal supraventricular tachycardia) (HCC)    2 short runs on monitor 06/2019   Pulmonary emboli (HCC)    PVC's (premature ventricular contractions)    Renal insufficiency    Special screening for malignant neoplasms,  colon     Past Surgical History:  Procedure Laterality Date   COLONOSCOPY N/A 08/16/2014   Procedure: COLONOSCOPY;  Surgeon: West Bali, MD;  [facility];  Service: Endoscopy;  Laterality: N/A;  10:15 AM   DOPPLER ECHOCARDIOGRAPHY     Preserved left venticular function   EMBOLECTOMY Right 01/23/2018   Procedure: RIGHT POPLITEAL-TIBIAL  ARTERY EMBOLECTOMY, EXPLORATION OF RIGHT ANTERIOR TIBIAL ARTERY, RIGHT POPLITEAL ARTERY VEIN ANGIOPLASTY;  Surgeon: Sherren Kerns, MD;  [facility]: [facility] OR;  Service: Vascular;  Laterality: Right;   ENDARTERECTOMY POPLITEAL Left 01/23/2018   Procedure: ENDARTERECTOMY OF LEFT POPLITEAL ARTERY AND TIBIAL-PERONEAL TRUNK;  Surgeon: Sherren Kerns, MD;  Location: [facility] OR;  Service: Vascular;  Laterality: Left;   FEMORAL-POPLITEAL BYPASS GRAFT Left 01/23/2018   Procedure: BYPASS GRAFT LEFT FEMORAL-POPLITEAL ARTERY WITH PROPATEN VASCULAR GRAFT;  Surgeon: Sherren Kerns, MD;  Location: [facility] OR;  Service: Vascular;  Laterality: Left;   HEMATOMA EVACUATION Left 01/25/2018   Procedure: EVACUATION HEMATOMA LEFT POPLITEAL SPACE;  Surgeon: Cephus Shelling, MD;  [facility] OR;  Service: Vascular;  Laterality: Left;   LEG SURGERY     left leg - has rod and 4 pins   PATELLAR TENDON REPAIR Right 04/26/2014   Procedure: RIGHT PATELLA TENDON REPAIR;  Surgeon: Sheral Apley, MD;  Location: [facility];  Service: Orthopedics;  Laterality: Right;    Family History  Problem Relation Age of Onset   Cancer Maternal Aunt    Hyperlipidemia Other  Diabetes Other    Heart disease Other     Social History   Socioeconomic History   Marital status: Significant Other    Spouse name: Not on file   Number of children: 0   Years of education: Not on file   Highest education level: Not on file  Occupational History   Not on file  Tobacco Use   Smoking status: Never   Smokeless tobacco: Never  Substance and Sexual Activity   Alcohol use: Yes     Comment: Drinks occassionally   Drug use: No   Sexual activity: Not on file  Other Topics Concern   Not on file  Social History Narrative   Not on file   Social Determinants of Health   Financial Resource Strain: Low Risk  (03/29/2023)   Overall Financial Resource Strain (CARDIA)    Difficulty of Paying Living Expenses: Not hard at all  Food Insecurity: No Food Insecurity (03/29/2023)   Hunger Vital Sign    Worried About Running Out of Food in the Last Year: Never true    Ran Out of Food in the Last Year: Never true  Transportation Needs: No Transportation Needs (03/29/2023)   PRAPARE - Administrator, Civil Service (Medical): No    Lack of Transportation (Non-Medical): No  Physical Activity: Inactive (03/29/2023)   Exercise Vital Sign    Days of Exercise per Week: 0 days    Minutes of Exercise per Session: 0 min  Stress: No Stress Concern Present (03/29/2023)   [employer] of Occupational Health - Occupational Stress Questionnaire    Feeling of Stress : Not at all  Social Connections: Unknown (03/29/2023)   Social Connection and Isolation Panel [NHANES]    Frequency of Communication with Friends and Family: More than three times a week    Frequency of Social Gatherings with Friends and Family: Three times a week    Attends Religious Services: 1 to 4 times per year    Active Member of Clubs or Organizations: No    Attends Banker Meetings: Never    Marital Status: Patient declined    Allergies  Allergen Reactions   Ivp Dye [Iodinated Contrast Media] Swelling    Outpatient Medications Prior to Visit  Medication Sig Dispense Refill   aspirin EC 81 MG tablet Take 1 tablet (81 mg total) by mouth daily. 90 tablet 3   diclofenac Sodium (VOLTAREN) 1 % GEL Apply 2 g topically 2 (two) times daily as needed. 100 g 1   ezetimibe (ZETIA) 10 MG tablet Take 1 tablet (10 mg total) by mouth daily. 90 tablet 1   gabapentin (NEURONTIN) 300 MG capsule  TAKE 1 CAPSULE EVERY MORNING AND TAKE 2 CAPSULES EVERY EVENING. 90 capsule 11   latanoprost (XALATAN) 0.005 % ophthalmic solution      lisinopril (ZESTRIL) 40 MG tablet Take 1 tablet (40 mg total) by mouth daily. 90 tablet 1   metoprolol tartrate (LOPRESSOR) 25 MG tablet Take 1 tablet (25 mg total) by mouth 2 (two) times daily. 180 tablet 1   nitroGLYCERIN (NITROSTAT) 0.4 MG SL tablet Place 1 tablet (0.4 mg total) under the tongue every 5 (five) minutes as needed for chest pain. 75 tablet 2   rosuvastatin (CRESTOR) 40 MG tablet Take 1 tablet (40 mg total) by mouth daily. 90 tablet 3   tamsulosin (FLOMAX) 0.4 MG CAPS capsule Take 1 capsule (0.4 mg total) by mouth at bedtime. 90 capsule 3   warfarin (COUMADIN) 5  MG tablet TAKE 1 TO 2 TABLETS BY MOUTH ONCE DAILY AS DIRECTED BY COUMADIN CLINIC 150 tablet 0   No facility-administered medications prior to visit.     ROS Review of Systems  Constitutional:  Negative for activity change and appetite change.  HENT:  Negative for sinus pressure and sore throat.   Respiratory:  Negative for chest tightness, shortness of breath and wheezing.   Cardiovascular:  Negative for chest pain and palpitations.  Gastrointestinal:  Negative for abdominal distention, abdominal pain and constipation.  Genitourinary: Negative.   Musculoskeletal: Negative.   Psychiatric/Behavioral:  Negative for behavioral problems and dysphoric mood.     Objective:  BP 128/79 (BP Location: Left Arm, Patient Position: Sitting, Cuff Size: Normal)   Pulse (!) 55   Ht 5\' 9"  (1.753 m)   Wt 219 lb 12.8 oz (99.7 kg)   SpO2 97%   BMI 32.46 kg/m      04/14/2023   10:21 AM 03/29/2023    5:12 PM 03/20/2023   10:45 PM  BP/Weight  Systolic BP 128 -- 140  Diastolic BP 79 -- 74  Wt. (Lbs) 219.8 215   BMI 32.46 kg/m2 31.75 kg/m2       Physical Exam Constitutional:      Appearance: He is well-developed.  Cardiovascular:     Rate and Rhythm: Normal rate.     Heart sounds:  Normal heart sounds. No murmur heard. Pulmonary:     Effort: Pulmonary effort is normal.     Breath sounds: Normal breath sounds. No wheezing or rales.  Chest:     Chest wall: No tenderness.  Abdominal:     General: Bowel sounds are normal. There is no distension.     Palpations: Abdomen is soft. There is no mass.     Tenderness: There is no abdominal tenderness.  Musculoskeletal:        General: Normal range of motion.     Right lower leg: No edema.     Left lower leg: No edema.  Neurological:     Mental Status: He is alert and oriented to person, place, and time.  Psychiatric:        Mood and Affect: Mood normal.        Latest Ref Rng & Units 03/20/2023    6:27 PM 12/27/2022   11:43 AM 11/10/2021    7:24 AM  CMP  Glucose 70 - 99 mg/dL 94  99  161   BUN 8 - 23 mg/dL 15  16  19    Creatinine 0.61 - 1.24 mg/dL 0.96  0.45  4.09   Sodium 135 - 145 mmol/L 136  138  137   Potassium 3.5 - 5.1 mmol/L 3.8  4.4  4.5   Chloride 98 - 111 mmol/L 98  102  100   CO2 22 - 32 mmol/L 24  20  25    Calcium 8.9 - 10.3 mg/dL 9.1  9.2  9.2   Total Protein 6.0 - 8.5 g/dL  7.0  7.4   Total Bilirubin 0.0 - 1.2 mg/dL  <8.1  0.3   Alkaline Phos 44 - 121 IU/L  118  74   AST 0 - 40 IU/L  22  26   ALT 0 - 44 IU/L  32  36     Lipid Panel     Component Value Date/Time   CHOL 156 12/27/2022 1143   TRIG 80 12/27/2022 1143   HDL 44 12/27/2022 1143   CHOLHDL 3.5 12/27/2022 1143  CHOLHDL 3.6 08/11/2016 1030   VLDL 19 08/11/2016 1030   LDLCALC 97 12/27/2022 1143   LDLDIRECT 164.1 05/04/2013 1107    CBC    Component Value Date/Time   WBC 8.4 03/20/2023 1827   RBC 5.54 03/20/2023 1827   HGB 14.7 03/20/2023 1827   HGB 14.0 12/27/2022 1143   HCT 47.1 03/20/2023 1827   HCT 43.4 12/27/2022 1143   PLT 202 03/20/2023 1827   PLT 202 12/27/2022 1143   MCV 85.0 03/20/2023 1827   MCV 83 12/27/2022 1143   MCH 26.5 03/20/2023 1827   MCHC 31.2 03/20/2023 1827   RDW 14.5 03/20/2023 1827   RDW 13.3  12/27/2022 1143   LYMPHSABS 1.8 12/01/2018 1227   MONOABS 0.6 12/01/2018 1227   EOSABS 0.1 12/01/2018 1227   BASOSABS 0.0 12/01/2018 1227    Lab Results  Component Value Date   HGBA1C 6.4 12/27/2022    Assessment & Plan:      Gonorrhea exposure Reported exposure to sexual partner with confirmed gonorrhea. Mild dysuria started after exposure.  Urinalysis negative for infection but positive for blood. -Administer Ceftriaxone 500mg  IM today. -Send urine for STD panel. -Advise safe sexual practices. -Results will be communicated via MyChart by Tuesday.          No orders of the defined types were placed in this encounter.   Follow-up: Return for previously scheduled appointment.       Hoy Register, MD, FAAFP. [facility] [place], [place] [phone]   04/14/2023, 10:58 AM

## 2023-04-14 NOTE — Patient Instructions (Signed)
VISIT SUMMARY:  Today, you were evaluated for a possible sexually transmitted disease (STD) exposure after a recent partner disclosed a diagnosis of gonorrhea. You reported a burning sensation and intermittent pain but no discharge or blood from the penis. Additionally, we discussed your history of heart attack and your current use of blood thinners, as well as a cyst on your skin, which is unrelated to your blood thinner use. You also mentioned that you are caring for your mother who has Alzheimer's disease.  YOUR PLAN:  -POSSIBLE GONORRHEA: Gonorrhea is a sexually transmitted infection caused by bacteria. You reported exposure to a partner with confirmed gonorrhea and have mild symptoms. Today, you received an injection of Ceftriaxone 500mg  to treat the infection. We also sent a urine sample for an STD panel to confirm the diagnosis. Please practice safe sexual habits to prevent future infections. We will communicate the test results to you via MyChart by Tuesday.  INSTRUCTIONS:  Please check your MyChart account by Tuesday for the results of your STD panel. If you have any questions or if your symptoms worsen, contact our office immediately.

## 2023-04-15 ENCOUNTER — Ambulatory Visit: Payer: Medicare HMO | Attending: Cardiovascular Disease

## 2023-04-15 ENCOUNTER — Other Ambulatory Visit: Payer: Self-pay | Admitting: *Deleted

## 2023-04-15 DIAGNOSIS — I739 Peripheral vascular disease, unspecified: Secondary | ICD-10-CM

## 2023-04-15 DIAGNOSIS — Z5181 Encounter for therapeutic drug level monitoring: Secondary | ICD-10-CM | POA: Diagnosis not present

## 2023-04-15 DIAGNOSIS — I2699 Other pulmonary embolism without acute cor pulmonale: Secondary | ICD-10-CM

## 2023-04-15 DIAGNOSIS — R6889 Other general symptoms and signs: Secondary | ICD-10-CM | POA: Diagnosis not present

## 2023-04-15 DIAGNOSIS — I824Y1 Acute embolism and thrombosis of unspecified deep veins of right proximal lower extremity: Secondary | ICD-10-CM

## 2023-04-15 LAB — POCT INR: INR: 2.8 (ref 2.0–3.0)

## 2023-04-15 LAB — URINE CYTOLOGY ANCILLARY ONLY
Chlamydia: NEGATIVE
Comment: NEGATIVE
Comment: NEGATIVE
Comment: NORMAL
Neisseria Gonorrhea: NEGATIVE
Trichomonas: NEGATIVE

## 2023-04-15 NOTE — Patient Instructions (Signed)
continue Warfarin 1 tablet daily except for 2 tablets on Mondays, Wednesdays and Fridays. Recheck INR in 5 weeks. Have some leafy veggies today and keep dark green leafy veggies consistent in your diet at least 2 times in your diet (collards, mixed greens, cabbage, broccoli, or spinach). Coumadin Clinic [phone] or [phone].

## 2023-04-19 ENCOUNTER — Telehealth: Payer: Self-pay

## 2023-04-19 NOTE — Telephone Encounter (Signed)
Transition Care Management Unsuccessful Follow-up Telephone Call  Date of discharge and from where:  03/20/2023 [facility]  Attempts:  1st Attempt  Reason for unsuccessful TCM follow-up call:  Unable to leave message voicemail not setup.  Ruston Fedora [facility]  [facility] Guide Direct Dial: [phone]  Website: [email or web address]

## 2023-04-20 ENCOUNTER — Telehealth: Payer: Self-pay

## 2023-04-20 NOTE — Telephone Encounter (Signed)
Transition Care Management Unsuccessful Follow-up Telephone Call  Date of discharge and from where:  03/20/2023 [facility]  Attempts:  2nd Attempt  Reason for unsuccessful TCM follow-up call:  Unable to leave message voicemail does not pickup.  Tecumseh Yeagley [facility]  [facility] Guide Direct Dial: [phone]  Website: [email or web address]

## 2023-04-21 ENCOUNTER — Ambulatory Visit (INDEPENDENT_AMBULATORY_CARE_PROVIDER_SITE_OTHER): Payer: Medicare HMO | Admitting: Physician Assistant

## 2023-04-21 ENCOUNTER — Encounter (HOSPITAL_COMMUNITY): Payer: Medicare HMO

## 2023-04-21 ENCOUNTER — Ambulatory Visit (HOSPITAL_COMMUNITY)
Admission: RE | Admit: 2023-04-21 | Discharge: 2023-04-21 | Disposition: A | Discharge status: Home / Self Care | Payer: Medicare HMO | Source: Ambulatory Visit | Attending: Vascular Surgery | Admitting: Vascular Surgery

## 2023-04-21 VITALS — BP 149/94 | HR 54 | Temp 97.9°F | Ht 69.0 in | Wt 219.0 lb

## 2023-04-21 DIAGNOSIS — I739 Peripheral vascular disease, unspecified: Secondary | ICD-10-CM | POA: Insufficient documentation

## 2023-04-21 DIAGNOSIS — R6889 Other general symptoms and signs: Secondary | ICD-10-CM | POA: Diagnosis not present

## 2023-04-21 LAB — VAS US ABI WITH/WO TBI
Left ABI: 0.67
Right ABI: 0.72
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
## 2023-04-27 DIAGNOSIS — R6889 Other general symptoms and signs: Secondary | ICD-10-CM | POA: Diagnosis not present

## 2023-05-02 ENCOUNTER — Encounter: Payer: Self-pay | Admitting: Internal Medicine

## 2023-05-02 ENCOUNTER — Ambulatory Visit: Payer: Medicare HMO | Attending: Internal Medicine | Admitting: Internal Medicine

## 2023-05-02 VITALS — BP 109/69 | HR 99 | Ht 69.0 in | Wt 218.0 lb

## 2023-05-02 DIAGNOSIS — I10 Essential (primary) hypertension: Secondary | ICD-10-CM | POA: Diagnosis not present

## 2023-05-02 DIAGNOSIS — R6889 Other general symptoms and signs: Secondary | ICD-10-CM | POA: Diagnosis not present

## 2023-05-02 DIAGNOSIS — E66811 Obesity, class 1: Secondary | ICD-10-CM | POA: Diagnosis not present

## 2023-05-02 DIAGNOSIS — I739 Peripheral vascular disease, unspecified: Secondary | ICD-10-CM | POA: Diagnosis not present

## 2023-05-02 DIAGNOSIS — Z6832 Body mass index (BMI) 32.0-32.9, adult: Secondary | ICD-10-CM

## 2023-05-02 DIAGNOSIS — I2510 Atherosclerotic heart disease of native coronary artery without angina pectoris: Secondary | ICD-10-CM

## 2023-05-02 DIAGNOSIS — E6609 Other obesity due to excess calories: Secondary | ICD-10-CM

## 2023-05-02 NOTE — Progress Notes (Signed)
Patient ID: Michael [name], male    DOB: 1961-02-06  MRN: [id number]  CC: Hypertension (HTN f/u. Randa Lynn form completion /No to flu vax)   Subjective: Michael [name] is a 62 y.o. male who presents for chronic ds management. His concerns today include:  Patient with history of HTN, CAD, PAD, PE/DVT on Coumadin followed at cardiology anticoag clinic, HL, CVA by MRI 11/2018, mild carotid artery disease, prediabetes. Chronic lower back pain    CAD/PAD/HTN: Should be on lisinopril 40 mg daily, metoprolol 25 mg twice a day, Crestor 40 mg daily, Zetia and aspirin 81 mg daily.  -taking meds consistently. -No CP/SOB/LE edema.  Still plays pickle ball 3-4x/wk; if he works too hard, he gets a little pain in the calf  PreDM/obesity:  down 10 lbs since I last saw him 12/2022 Eating less starches.  Plays pickle 3-4x/wk  Hx DVT/PE:  still on Coumadin with INR being following through anticoag clinic with cardiology.  Recent CBC last mth was nl.  No bruising or bleeding  HM: no to flu shot today but may get at [facility].  Has a form With him from [facility] for me to complete.  He is in the special needs program.  I need to verify one of the diagnoses that he has and this would allow him to get special benefits.  His qualifying diagnosis is PAD.  Form completed today. Patient Active Problem List   Diagnosis Date Noted   Prediabetes 12/04/2020   Obesity (BMI 30.0-34.9) 12/04/2020   DVT, lower extremity (HCC) 03/14/2019   History of pulmonary embolism 09/11/2018   Obsessive-compulsive disorder    Muscle cramps    Postoperative pain    Therapeutic opioid induced constipation    CKD (chronic kidney disease), stage II    Acute blood loss anemia    Debility    Ischemia of extremity 01/23/2018   Frequency of urination and polyuria 07/06/2017   Erectile dysfunction due to diseases classified elsewhere 10/27/2016   PAD (peripheral artery disease) (HCC) 08/11/2016   Nocturia 08/11/2016   Chronic  pain of both knees 04/01/2016   Palpitations 05/05/2015   Hyperlipidemia 05/05/2015   HNP (herniated nucleus pulposus), cervical 10/14/2014   Chronic low back pain 10/14/2014   Numbness and tingling of both upper extremities while sleeping 10/14/2014   Frequent urination 09/03/2014   Lower abdominal pain 09/03/2014   Cervical stenosis of spine 05/16/2014   Essential hypertension 05/16/2014   Numbness and tingling of right arm 11/28/2013   Dental abscess 04/26/2013   Dysuria 04/26/2013   HTN (hypertension) 04/04/2013   CAD (coronary artery disease) 05/25/2011     Current Outpatient Medications on File Prior to Visit  Medication Sig Dispense Refill   aspirin EC 81 MG tablet Take 1 tablet (81 mg total) by mouth daily. 90 tablet 3   diclofenac Sodium (VOLTAREN) 1 % GEL Apply 2 g topically 2 (two) times daily as needed. 100 g 1   ezetimibe (ZETIA) 10 MG tablet Take 1 tablet (10 mg total) by mouth daily. 90 tablet 1   gabapentin (NEURONTIN) 300 MG capsule TAKE 1 CAPSULE EVERY MORNING AND TAKE 2 CAPSULES EVERY EVENING. 90 capsule 11   latanoprost (XALATAN) 0.005 % ophthalmic solution      lisinopril (ZESTRIL) 40 MG tablet Take 1 tablet (40 mg total) by mouth daily. 90 tablet 1   metoprolol tartrate (LOPRESSOR) 25 MG tablet Take 1 tablet (25 mg total) by mouth 2 (two) times daily. 180 tablet 1  nitroGLYCERIN (NITROSTAT) 0.4 MG SL tablet Place 1 tablet (0.4 mg total) under the tongue every 5 (five) minutes as needed for chest pain. 75 tablet 2   rosuvastatin (CRESTOR) 40 MG tablet Take 1 tablet (40 mg total) by mouth daily. 90 tablet 3   tamsulosin (FLOMAX) 0.4 MG CAPS capsule Take 1 capsule (0.4 mg total) by mouth at bedtime. 90 capsule 3   warfarin (COUMADIN) 5 MG tablet TAKE 1 TO 2 TABLETS BY MOUTH ONCE DAILY AS DIRECTED BY COUMADIN CLINIC 150 tablet 0   No current facility-administered medications on file prior to visit.    Allergies  Allergen Reactions   Ivp Dye [Iodinated Contrast  Media] Swelling    Social History   Socioeconomic History   Marital status: Significant Other    Spouse name: Not on file   Number of children: 0   Years of education: Not on file   Highest education level: Not on file  Occupational History   Not on file  Tobacco Use   Smoking status: Never   Smokeless tobacco: Never  Substance and Sexual Activity   Alcohol use: Yes    Comment: Drinks occassionally   Drug use: No   Sexual activity: Not on file  Other Topics Concern   Not on file  Social History Narrative   Not on file   Social Determinants of Health   Financial Resource Strain: Low Risk  (03/29/2023)   Overall Financial Resource Strain (CARDIA)    Difficulty of Paying Living Expenses: Not hard at all  Food Insecurity: No Food Insecurity (03/29/2023)   Hunger Vital Sign    Worried About Running Out of Food in the Last Year: Never true    Ran Out of Food in the Last Year: Never true  Transportation Needs: No Transportation Needs (03/29/2023)   PRAPARE - Administrator, Civil Service (Medical): No    Lack of Transportation (Non-Medical): No  Physical Activity: Inactive (03/29/2023)   Exercise Vital Sign    Days of Exercise per Week: 0 days    Minutes of Exercise per Session: 0 min  Stress: No Stress Concern Present (03/29/2023)   [employer] of Occupational Health - Occupational Stress Questionnaire    Feeling of Stress : Not at all  Social Connections: Unknown (03/29/2023)   Social Connection and Isolation Panel [NHANES]    Frequency of Communication with Friends and Family: More than three times a week    Frequency of Social Gatherings with Friends and Family: Three times a week    Attends Religious Services: 1 to 4 times per year    Active Member of Clubs or Organizations: No    Attends Banker Meetings: Never    Marital Status: Patient declined  Intimate Partner Violence: Not At Risk (03/29/2023)   Humiliation, Afraid, Rape, and  Kick questionnaire    Fear of Current or Ex-Partner: No    Emotionally Abused: No    Physically Abused: No    Sexually Abused: No    Family History  Problem Relation Age of Onset   Cancer Maternal Aunt    Hyperlipidemia Other    Diabetes Other    Heart disease Other     Past Surgical History:  Procedure Laterality Date   COLONOSCOPY N/A 08/16/2014   Procedure: COLONOSCOPY;  Surgeon: West Bali, MD;  [facility];  Service: Endoscopy;  Laterality: N/A;  10:15 AM   DOPPLER ECHOCARDIOGRAPHY     Preserved left venticular function  EMBOLECTOMY Right 01/23/2018   Procedure: RIGHT POPLITEAL-TIBIAL  ARTERY EMBOLECTOMY, EXPLORATION OF RIGHT ANTERIOR TIBIAL ARTERY, RIGHT POPLITEAL ARTERY VEIN ANGIOPLASTY;  Surgeon: Sherren Kerns, MD;  Location: [facility] OR;  Service: Vascular;  Laterality: Right;   ENDARTERECTOMY POPLITEAL Left 01/23/2018   Procedure: ENDARTERECTOMY OF LEFT POPLITEAL ARTERY AND TIBIAL-PERONEAL TRUNK;  Surgeon: Sherren Kerns, MD;  Location: [facility] OR;  Service: Vascular;  Laterality: Left;   FEMORAL-POPLITEAL BYPASS GRAFT Left 01/23/2018   Procedure: BYPASS GRAFT LEFT FEMORAL-POPLITEAL ARTERY WITH PROPATEN VASCULAR GRAFT;  Surgeon: Sherren Kerns, MD;  Location: [facility] OR;  Service: Vascular;  Laterality: Left;   HEMATOMA EVACUATION Left 01/25/2018   Procedure: EVACUATION HEMATOMA LEFT POPLITEAL SPACE;  Surgeon: Cephus Shelling, MD;  Location: [facility] OR;  Service: Vascular;  Laterality: Left;   LEG SURGERY     left leg - has rod and 4 pins   PATELLAR TENDON REPAIR Right 04/26/2014   Procedure: RIGHT PATELLA TENDON REPAIR;  Surgeon: Sheral Apley, MD;  Location: [facility];  Service: Orthopedics;  Laterality: Right;    ROS: Review of Systems Negative except as stated above  PHYSICAL EXAM: BP 109/69 (BP Location: Left Arm, Patient Position: Sitting, Cuff Size: Normal)   Pulse 99   Ht 5\' 9"  (1.753 m)   Wt 218 lb (98.9 kg)   SpO2 99%   BMI  32.19 kg/m   Wt Readings from Last 3 Encounters:  05/02/23 218 lb (98.9 kg)  04/21/23 219 lb (99.3 kg)  04/14/23 219 lb 12.8 oz (99.7 kg)    Physical Exam   General appearance - alert, well appearing, older African-American male and in no distress Mental status - normal mood, behavior, speech, dress, motor activity, and thought processes Neck - supple, no significant adenopathy Chest - clear to auscultation, no wheezes, rales or rhonchi, symmetric air entry Heart - normal rate, regular rhythm, normal S1, S2, no murmurs, rubs, clicks or gallops Extremities - peripheral pulses normal, no pedal edema, no clubbing or cyanosis     Latest Ref Rng & Units 03/20/2023    6:27 PM 12/27/2022   11:43 AM 11/10/2021    7:24 AM  CMP  Glucose 70 - 99 mg/dL 94  99  010   BUN 8 - 23 mg/dL 15  16  19    Creatinine 0.61 - 1.24 mg/dL 2.72  5.36  6.44   Sodium 135 - 145 mmol/L 136  138  137   Potassium 3.5 - 5.1 mmol/L 3.8  4.4  4.5   Chloride 98 - 111 mmol/L 98  102  100   CO2 22 - 32 mmol/L 24  20  25    Calcium 8.9 - 10.3 mg/dL 9.1  9.2  9.2   Total Protein 6.0 - 8.5 g/dL  7.0  7.4   Total Bilirubin 0.0 - 1.2 mg/dL  <0.3  0.3   Alkaline Phos 44 - 121 IU/L  118  74   AST 0 - 40 IU/L  22  26   ALT 0 - 44 IU/L  32  36    Lipid Panel     Component Value Date/Time   CHOL 156 12/27/2022 1143   TRIG 80 12/27/2022 1143   HDL 44 12/27/2022 1143   CHOLHDL 3.5 12/27/2022 1143   CHOLHDL 3.6 08/11/2016 1030   VLDL 19 08/11/2016 1030   LDLCALC 97 12/27/2022 1143   LDLDIRECT 164.1 05/04/2013 1107    CBC    Component Value Date/Time   WBC  8.4 03/20/2023 1827   RBC 5.54 03/20/2023 1827   HGB 14.7 03/20/2023 1827   HGB 14.0 12/27/2022 1143   HCT 47.1 03/20/2023 1827   HCT 43.4 12/27/2022 1143   PLT 202 03/20/2023 1827   PLT 202 12/27/2022 1143   MCV 85.0 03/20/2023 1827   MCV 83 12/27/2022 1143   MCH 26.5 03/20/2023 1827   MCHC 31.2 03/20/2023 1827   RDW 14.5 03/20/2023 1827   RDW 13.3  12/27/2022 1143   LYMPHSABS 1.8 12/01/2018 1227   MONOABS 0.6 12/01/2018 1227   EOSABS 0.1 12/01/2018 1227   BASOSABS 0.0 12/01/2018 1227    ASSESSMENT AND PLAN: 1. Essential hypertension At goal on  lisinopril 40 mg daily, metoprolol 25 mg twice a day  2. Coronary artery disease involving native coronary artery of native heart without angina pectoris Stable.  Continue metoprolol 25 mg twice a day, aspirin 81 mg daily, Zetia 10 mg daily and Crestor 40 mg daily  3. PAD (peripheral artery disease) (HCC) Stable.  Continue aspirin, Zetia and Crestor  4. Class 1 obesity due to excess calories with serious comorbidity and body mass index (BMI) of 32.0 to 32.9 in adult Commended him on weight loss so far.  Encouraged him to continue healthy eating habits and regular exercise   Patient was given the opportunity to ask questions.  Patient verbalized understanding of the plan and was able to repeat key elements of the plan.   This documentation was completed using Paediatric nurse.  Any transcriptional errors are unintentional.  No orders of the defined types were placed in this encounter.    Requested Prescriptions    No prescriptions requested or ordered in this encounter    Return in about 4 months (around 08/30/2023).  Jonah Blue, MD, FACP

## 2023-05-03 ENCOUNTER — Telehealth: Payer: Self-pay | Admitting: Internal Medicine

## 2023-05-03 NOTE — Telephone Encounter (Signed)
Copied from CRM [id number]. Topic: General - Other >> May 03, 2023  1:11 PM Franchot Heidelberg wrote: Reason for CRM: Pt updated his pharmacy, now uses [facility] on [address]. Preferred pharmacy list updated

## 2023-05-03 NOTE — Telephone Encounter (Signed)
Noted  

## 2023-05-05 ENCOUNTER — Other Ambulatory Visit: Payer: Self-pay

## 2023-05-05 DIAGNOSIS — I739 Peripheral vascular disease, unspecified: Secondary | ICD-10-CM

## 2023-05-20 ENCOUNTER — Ambulatory Visit: Payer: Medicare HMO | Attending: Cardiology

## 2023-05-20 ENCOUNTER — Telehealth: Payer: Self-pay

## 2023-05-20 NOTE — Telephone Encounter (Signed)
Tried to leave message regarding INR appt, no vm setup

## 2023-05-29 ENCOUNTER — Ambulatory Visit
Admission: EM | Admit: 2023-05-29 | Discharge: 2023-05-29 | Disposition: A | Discharge status: Home / Self Care | Payer: Medicare HMO | Attending: Emergency Medicine | Admitting: Emergency Medicine

## 2023-05-29 DIAGNOSIS — R3121 Asymptomatic microscopic hematuria: Secondary | ICD-10-CM | POA: Diagnosis not present

## 2023-05-29 DIAGNOSIS — M25562 Pain in left knee: Secondary | ICD-10-CM | POA: Insufficient documentation

## 2023-05-29 DIAGNOSIS — G8929 Other chronic pain: Secondary | ICD-10-CM | POA: Diagnosis not present

## 2023-05-29 DIAGNOSIS — R202 Paresthesia of skin: Secondary | ICD-10-CM | POA: Diagnosis not present

## 2023-05-29 DIAGNOSIS — A64 Unspecified sexually transmitted disease: Secondary | ICD-10-CM | POA: Diagnosis not present

## 2023-05-29 DIAGNOSIS — R350 Frequency of micturition: Secondary | ICD-10-CM | POA: Insufficient documentation

## 2023-05-29 DIAGNOSIS — H1032 Unspecified acute conjunctivitis, left eye: Secondary | ICD-10-CM | POA: Diagnosis not present

## 2023-05-29 DIAGNOSIS — R200 Anesthesia of skin: Secondary | ICD-10-CM | POA: Insufficient documentation

## 2023-05-29 DIAGNOSIS — M25561 Pain in right knee: Secondary | ICD-10-CM | POA: Diagnosis not present

## 2023-05-29 LAB — POCT URINALYSIS DIP (MANUAL ENTRY)
Bilirubin, UA: NEGATIVE
Glucose, UA: NEGATIVE mg/dL
Ketones, POC UA: NEGATIVE mg/dL
Leukocytes, UA: NEGATIVE
Nitrite, UA: NEGATIVE
Protein Ur, POC: NEGATIVE mg/dL
Spec Grav, UA: 1.02 (ref 1.010–1.025)
Urobilinogen, UA: 0.2 U/dL
pH, UA: 5.5 (ref 5.0–8.0)

## 2023-05-29 MED ORDER — OFLOXACIN 0.3 % OP SOLN: 1.0000 [drp] | Freq: Four times a day (QID) | OPHTHALMIC | 0 refills | Status: AC

## 2023-05-29 NOTE — ED Triage Notes (Signed)
Patient present with redness in left eye x day 2 and would like STI testing. Reports burning when urinating x 3-4 days.

## 2023-05-29 NOTE — Discharge Instructions (Signed)
Ofloxacin eyedrop was prescribed/take as directed for conjunctivitis STD screening was completed someone will call you if abnormal result  Follow up with [facility]CP or [facility] if symptoms persists Return here or go to ER if you have any new or worsening symptoms

## 2023-05-29 NOTE — ED Provider Notes (Signed)
[facility]   [id number] 05/29/23 Arrival Time: 0945   [id number] FOR STD  SUBJECTIVE:  Michael [name] is a 62 y.o. male who presents requesting STI testing due to a new partner and urinary frequency for the past 4 days.  Currently asymptomatic.  Partner asymptomatic.  Sexually active with 1 male partner.  Denies similar symptoms in the past.  Denies fever, chills, nausea, vomiting, abdominal or pelvic pain.  He is also complaining of left eye irritation.  Denies any precipitating event.  He has not tried any OTC medication.  Denies similar symptoms in the past.  Denies chills, fever, nausea, vomiting and diarrhea.   No LMP for male patient.  ROS: As per HPI.  All other pertinent ROS negative.     Past Medical History:  Diagnosis Date   Alcohol abuse    Anterior myocardial infarction [facility])    ST-elevation; S/P emergent  drug-eluting stenting of proximal left anterior descending   Coronary artery disease    a. STEMI 2012 - s/p DES to LAD with  diffuse distal disease in the right posterolateral branch. The third OM was occluded and filled from left to left collaterals. LVEF preserved.   DVT complicating pregnancy    Dysuria 04/26/2013   Essential hypertension 05/16/2014   Frequent urination 09/03/2014   HNP (herniated nucleus pulposus), cervical 10/14/2014   Hyperlipidemia    Hypertension    Ischemia of extremity 01/23/2018   Lower abdominal pain 09/03/2014   PAD (peripheral artery disease) (HCC)    Pre-diabetes    Premature atrial contraction    PSVT (paroxysmal supraventricular tachycardia) (HCC)    2 short runs on monitor 06/2019   Pulmonary emboli (HCC)    PVC's (premature ventricular contractions)    Renal insufficiency    Special screening for malignant neoplasms, colon    Past Surgical History:  Procedure Laterality Date   COLONOSCOPY N/A 08/16/2014   Procedure: COLONOSCOPY;  Surgeon: West Bali, MD;  [facility];  Service: Endoscopy;   Laterality: N/A;  10:15 AM   DOPPLER ECHOCARDIOGRAPHY     Preserved left venticular function   EMBOLECTOMY Right 01/23/2018   Procedure: RIGHT POPLITEAL-TIBIAL  ARTERY EMBOLECTOMY, EXPLORATION OF RIGHT ANTERIOR TIBIAL ARTERY, RIGHT POPLITEAL ARTERY VEIN ANGIOPLASTY;  Surgeon: Sherren Kerns, MD;  Location: [facility] OR;  Service: Vascular;  Laterality: Right;   ENDARTERECTOMY POPLITEAL Left 01/23/2018   Procedure: ENDARTERECTOMY OF LEFT POPLITEAL ARTERY AND TIBIAL-PERONEAL TRUNK;  Surgeon: Sherren Kerns, MD;  Location: [facility] OR;  Service: Vascular;  Laterality: Left;   FEMORAL-POPLITEAL BYPASS GRAFT Left 01/23/2018   Procedure: BYPASS GRAFT LEFT FEMORAL-POPLITEAL ARTERY WITH PROPATEN VASCULAR GRAFT;  Surgeon: Sherren Kerns, MD;  Location: [facility] OR;  Service: Vascular;  Laterality: Left;   HEMATOMA EVACUATION Left 01/25/2018   Procedure: EVACUATION HEMATOMA LEFT POPLITEAL SPACE;  Surgeon: Cephus Shelling, MD;  Location: [facility] OR;  Service: Vascular;  Laterality: Left;   LEG SURGERY     left leg - has rod and 4 pins   PATELLAR TENDON REPAIR Right 04/26/2014   Procedure: RIGHT PATELLA TENDON REPAIR;  Surgeon: Sheral Apley, MD;  Location: [facility];  Service: Orthopedics;  Laterality: Right;   Allergies  Allergen Reactions   Ivp Dye [Iodinated Contrast Media] Swelling   No current facility-administered medications on file prior to encounter.   Current Outpatient Medications on File Prior to Encounter  Medication Sig Dispense Refill   aspirin EC 81 MG tablet Take 1 tablet (  81 mg total) by mouth daily. 90 tablet 3   diclofenac Sodium (VOLTAREN) 1 % GEL Apply 2 g topically 2 (two) times daily as needed. 100 g 1   ezetimibe (ZETIA) 10 MG tablet Take 1 tablet (10 mg total) by mouth daily. 90 tablet 1   gabapentin (NEURONTIN) 300 MG capsule TAKE 1 CAPSULE EVERY MORNING AND TAKE 2 CAPSULES EVERY EVENING. 90 capsule 11   latanoprost (XALATAN) 0.005 % ophthalmic solution       lisinopril (ZESTRIL) 40 MG tablet Take 1 tablet (40 mg total) by mouth daily. 90 tablet 1   metoprolol tartrate (LOPRESSOR) 25 MG tablet Take 1 tablet (25 mg total) by mouth 2 (two) times daily. 180 tablet 1   nitroGLYCERIN (NITROSTAT) 0.4 MG SL tablet Place 1 tablet (0.4 mg total) under the tongue every 5 (five) minutes as needed for chest pain. 75 tablet 2   rosuvastatin (CRESTOR) 40 MG tablet Take 1 tablet (40 mg total) by mouth daily. 90 tablet 3   tamsulosin (FLOMAX) 0.4 MG CAPS capsule Take 1 capsule (0.4 mg total) by mouth at bedtime. 90 capsule 3   warfarin (COUMADIN) 5 MG tablet TAKE 1 TO 2 TABLETS BY MOUTH ONCE DAILY AS DIRECTED BY COUMADIN CLINIC 150 tablet 0   Social History   Socioeconomic History   Marital status: Significant Other    Spouse name: Not on file   Number of children: 0   Years of education: Not on file   Highest education level: Not on file  Occupational History   Not on file  Tobacco Use   Smoking status: Never   Smokeless tobacco: Never  Substance and Sexual Activity   Alcohol use: Not Currently    Comment: Drinks occassionally   Drug use: No   Sexual activity: Yes  Other Topics Concern   Not on file  Social History Narrative   Not on file   Social Drivers of Health   Financial Resource Strain: Low Risk  (03/29/2023)   Overall Financial Resource Strain (CARDIA)    Difficulty of Paying Living Expenses: Not hard at all  Food Insecurity: No Food Insecurity (03/29/2023)   Hunger Vital Sign    Worried About Running Out of Food in the Last Year: Never true    Ran Out of Food in the Last Year: Never true  Transportation Needs: No Transportation Needs (03/29/2023)   PRAPARE - Administrator, Civil Service (Medical): No    Lack of Transportation (Non-Medical): No  Physical Activity: Inactive (03/29/2023)   Exercise Vital Sign    Days of Exercise per Week: 0 days    Minutes of Exercise per Session: 0 min  Stress: No Stress Concern Present  (03/29/2023)   [employer] of Occupational Health - Occupational Stress Questionnaire    Feeling of Stress : Not at all  Social Connections: Unknown (03/29/2023)   Social Connection and Isolation Panel [NHANES]    Frequency of Communication with Friends and Family: More than three times a week    Frequency of Social Gatherings with Friends and Family: Three times a week    Attends Religious Services: 1 to 4 times per year    Active Member of Clubs or Organizations: No    Attends Banker Meetings: Never    Marital Status: Patient declined  Intimate Partner Violence: Not At Risk (03/29/2023)   Humiliation, Afraid, Rape, and Kick questionnaire    Fear of Current or Ex-Partner: No    Emotionally Abused:  No    Physically Abused: No    Sexually Abused: No   Family History  Problem Relation Age of Onset   Cancer Maternal Aunt    Hyperlipidemia Other    Diabetes Other    Heart disease Other     OBJECTIVE:  Vitals:   05/29/23 1032 05/29/23 1033 05/29/23 1037  BP:  137/87   Pulse:  (!) 56 62  Resp:  18   Temp:  98.7 F (37.1 C)   TempSrc:  Oral   SpO2:  99%   Weight: 218 lb (98.9 kg)    Height: 5\' 9"  (1.753 m)       Physical Exam Vitals and nursing note reviewed.  Constitutional:      General: He is not in acute distress.    Appearance: Normal appearance. He is normal weight. He is not ill-appearing, toxic-appearing or diaphoretic.  Eyes:     General: Lids are normal. Lids are everted, no foreign bodies appreciated. Vision grossly intact.        Left eye: Discharge present.No hordeolum.  Cardiovascular:     Rate and Rhythm: Normal rate and regular rhythm.     Pulses: Normal pulses.     Heart sounds: Normal heart sounds. No murmur heard.    No friction rub. No gallop.  Pulmonary:     Effort: Pulmonary effort is normal. No respiratory distress.     Breath sounds: Normal breath sounds. No stridor. No wheezing, rhonchi or rales.  Chest:     Chest wall:  No tenderness.  Neurological:     Mental Status: He is alert and oriented to person, place, and time.      LABS:  Results for orders placed or performed during the hospital encounter of 05/29/23  POCT urinalysis dipstick   Collection Time: 05/29/23 11:57 AM  Result Value Ref Range   Color, UA yellow yellow   Clarity, UA clear clear   Glucose, UA negative negative mg/dL   Bilirubin, UA negative negative   Ketones, POC UA negative negative mg/dL   Spec Grav, UA 1.610 9.604 - 1.025   Blood, UA moderate (A) negative   pH, UA 5.5 5.0 - 8.0   Protein Ur, POC negative negative mg/dL   Urobilinogen, UA 0.2 0.2 or 1.0 E.U./dL   Nitrite, UA Negative Negative   Leukocytes, UA Negative Negative    Labs Reviewed  POCT URINALYSIS DIP (MANUAL ENTRY) - Abnormal; Notable for the following components:      Result Value   Blood, UA moderate (*)    All other components within normal limits  URINE CULTURE  CYTOLOGY, (ORAL, ANAL, URETHRAL) ANCILLARY ONLY    ASSESSMENT & PLAN:  1. Chronic pain of both knees   2. Numbness and tingling of right arm   3. Urinary frequency   4. STI (sexually transmitted infection)   5. Asymptomatic microscopic hematuria   6. Acute conjunctivitis of left eye, unspecified acute conjunctivitis type     Meds ordered this encounter  Medications   ofloxacin (OCUFLOX) 0.3 % ophthalmic solution    Sig: Place 1 drop into the left eye 4 (four) times daily.    Dispense:  5 mL    Refill:  0    Pending: Labs Reviewed  POCT URINALYSIS DIP (MANUAL ENTRY) - Abnormal; Notable for the following components:      Result Value   Blood, UA moderate (*)    All other components within normal limits  URINE CULTURE  CYTOLOGY, (ORAL, ANAL,  URETHRAL) ANCILLARY ONLY    Discharge instructions  Ofloxacin eyedrop was prescribed/take as directed for conjunctivitis STD screening was completed someone will call you if abnormal result  Follow up with [facility]CP or [facility] if  symptoms persists Return here or go to ER if you have any new or worsening symptoms    Reviewed expectations re: course of current medical issues. Questions answered. Outlined signs and symptoms indicating need for more acute intervention. Patient verbalized understanding. After Visit Summary given.        Durward Parcel, FNP 05/29/23 1209

## 2023-05-30 LAB — URINE CULTURE: Culture: NO GROWTH

## 2023-05-30 LAB — CYTOLOGY, (ORAL, ANAL, URETHRAL) ANCILLARY ONLY
Chlamydia: NEGATIVE
Comment: NEGATIVE
Comment: NEGATIVE
Comment: NORMAL
Neisseria Gonorrhea: NEGATIVE
Trichomonas: NEGATIVE

## 2023-06-06 DIAGNOSIS — H40013 Open angle with borderline findings, low risk, bilateral: Secondary | ICD-10-CM | POA: Diagnosis not present

## 2023-06-06 DIAGNOSIS — R6889 Other general symptoms and signs: Secondary | ICD-10-CM | POA: Diagnosis not present

## 2023-07-01 ENCOUNTER — Ambulatory Visit: Payer: Medicare HMO | Attending: Cardiovascular Disease | Admitting: *Deleted

## 2023-07-01 DIAGNOSIS — Z5181 Encounter for therapeutic drug level monitoring: Secondary | ICD-10-CM | POA: Diagnosis not present

## 2023-07-01 DIAGNOSIS — I824Y1 Acute embolism and thrombosis of unspecified deep veins of right proximal lower extremity: Secondary | ICD-10-CM | POA: Diagnosis not present

## 2023-07-01 DIAGNOSIS — I2699 Other pulmonary embolism without acute cor pulmonale: Secondary | ICD-10-CM

## 2023-07-01 LAB — POCT INR: INR: 3 (ref 2.0–3.0)

## 2023-07-01 NOTE — Patient Instructions (Signed)
Description   Continue taking Warfarin 1 tablet daily except for 2 tablets on Mondays, Wednesdays and Fridays. Recheck INR in 6 weeks. Have some leafy veggies today and keep dark green leafy veggies consistent in your diet at least 2 times in your diet (collards, mixed greens, cabbage, broccoli, or spinach). Coumadin Clinic [phone] or [phone].

## 2023-07-11 ENCOUNTER — Ambulatory Visit: Payer: Medicare HMO | Admitting: Internal Medicine

## 2023-07-12 ENCOUNTER — Other Ambulatory Visit: Payer: Self-pay | Admitting: Internal Medicine

## 2023-07-12 NOTE — Telephone Encounter (Signed)
Copied from CRM [id number]. Topic: Clinical - Medication Refill >> Jul 12, 2023  1:04 PM Thomes Dinning wrote: Most Recent Primary Care Visit:  Provider: Jonah Blue B  Department: [facility]  Visit Type: OFFICE VISIT  Date: 05/02/2023  Medication: metoprolol tartrate (LOPRESSOR) 25 MG tablet  Has the patient contacted their pharmacy? Yes (Agent: If no, request that the patient contact the pharmacy for the refill. If patient does not wish to contact the pharmacy document the reason why and proceed with request.) (Agent: If yes, when and what did the pharmacy advise?) Was advised the script was not at the pharmacy  Is this the correct pharmacy for this prescription? Yes If no, delete pharmacy and type the correct one.  This is the patient's preferred pharmacy:  [facility] [address] ([place]), [place] - [address] [place] ([place] [address] Phone: [phone] Fax: [phone]  [facility] - [place], [place] - [address] [place] [address] Phone: [phone] Fax: [phone]   Has the prescription been filled recently? Yes  Is the patient out of the medication? Yes  Has the patient been seen for an appointment in the last year OR does the patient have an upcoming appointment? Yes  Can we respond through MyChart? No  Agent: Please be advised that Rx refills may take up to 3 business days. We ask that you follow-up with your pharmacy.

## 2023-07-12 NOTE — Telephone Encounter (Unsigned)
Copied from CRM [id number]. Topic: Clinical - Medication Refill >> Jul 12, 2023  9:32 AM Louie Casa B wrote: Most Recent Primary Care Visit:  Provider: Jonah Blue B  Department: [facility]  Visit Type: OFFICE VISIT  Date: 05/02/2023  Medication: metoprolol tartrate (LOPRESSOR) 25 MG tablet   Has the patient contacted their pharmacy? Yes (Agent: If no, request that the patient contact the pharmacy for the refill. If patient does not wish to contact the pharmacy document the reason why and proceed with request.) (Agent: If yes, when and what did the pharmacy advise?)  Is this the correct pharmacy for this prescription? Yes If no, delete pharmacy and type the correct one.  This is the patient's preferred pharmacy:  [id number] - [place] ([place]), [place] - [address] [place] ([place]) [place] [address] Phone: [phone] Fax: [phone]     Has the prescription been filled recently? Yes  Is the patient out of the medication? Yes  Has the patient been seen for an appointment in the last year OR does the patient have an upcoming appointment? No  Can we respond through MyChart? Yes  Agent: Please be advised that Rx refills may take up to 3 business days. We ask that you follow-up with your pharmacy.

## 2023-07-13 ENCOUNTER — Other Ambulatory Visit: Payer: Self-pay | Admitting: Internal Medicine

## 2023-07-13 MED ORDER — METOPROLOL TARTRATE 25 MG PO TABS: 25.0000 mg | ORAL_TABLET | Freq: Two times a day (BID) | ORAL | 1 refills | Status: DC

## 2023-07-13 MED ORDER — EZETIMIBE 10 MG PO TABS: 10.0000 mg | ORAL_TABLET | Freq: Every day | ORAL | 0 refills | Status: DC

## 2023-07-13 NOTE — Telephone Encounter (Signed)
Copied from CRM [id number]. Topic: Clinical - Medication Refill >> Jul 13, 2023  9:28 AM Fuller Mandril wrote: Most Recent Primary Care Visit:  Provider: Jonah Blue B  Department: [facility]  Visit Type: OFFICE VISIT  Date: 05/02/2023  Medication: ezetimibe (ZETIA) 10 MG tablet  Has the patient contacted their pharmacy? Yes (Agent: If no, request that the patient contact the pharmacy for the refill. If patient does not wish to contact the pharmacy document the reason why and proceed with request.) (Agent: If yes, when and what did the pharmacy advise?) No refills provider has to submit   Is this the correct pharmacy for this prescription? Yes If no, delete pharmacy and type the correct one.  This is the patient's preferred pharmacy:  [id number] - [place] ([place]), [place] - [address] [place] ([place]) [place] [address] Phone: [phone] Fax: [phone]   Has the prescription been filled recently? No  Is the patient out of the medication? Yes  Has the patient been seen for an appointment in the last year OR does the patient have an upcoming appointment? Yes 05/02/2023 / 08/09/2023  Can we respond through MyChart? No  Agent: Please be advised that Rx refills may take up to 3 business days. We ask that you follow-up with your pharmacy.

## 2023-07-13 NOTE — Telephone Encounter (Signed)
Requested Prescriptions  Pending Prescriptions Disp Refills   lisinopril (ZESTRIL) 40 MG tablet [Pharmacy Med Name: Lisinopril 40 MG Oral Tablet] 90 tablet 0    Sig: Take 1 tablet by mouth once daily     Cardiovascular:  ACE Inhibitors Passed - 07/13/2023 10:18 AM      Passed - Cr in normal range and within 180 days    Creat  Date Value Ref Range Status  08/11/2016 1.16 0.70 - 1.33 mg/dL Final    Comment:      For patients > or = 63 years of age: The upper reference limit for Creatinine is approximately 13% higher for people identified as African-American.      Creatinine, Ser  Date Value Ref Range Status  03/20/2023 1.24 0.61 - 1.24 mg/dL Final         Passed - K in normal range and within 180 days    Potassium  Date Value Ref Range Status  03/20/2023 3.8 3.5 - 5.1 mmol/L Final         Passed - Patient is not pregnant      Passed - Last BP in normal range    BP Readings from Last 1 Encounters:  05/29/23 137/87         Passed - Valid encounter within last 6 months    Recent Outpatient Visits           2 months ago Essential hypertension   [facility] [facility] [place]. [facility] Marcine Matar, MD   3 months ago Exposure to gonorrhea   [place] [facility] [place] [facility] [place]. [facility] Hoy Register, MD   6 months ago Essential hypertension   [place] [facility] [place] [facility] [place]. [facility] Marcine Matar, MD   11 months ago Dysuria   [place] [facility] [place]. [facility] Claiborne Rigg, NP   1 year ago Essential hypertension   [place] [facility] [place] [facility] [place]. [facility] Marcine Matar, MD       Future Appointments             In 1 month Laural Benes Binnie Rail, MD [facility] [place] - [facility] Dept Of [place]igha Bridegroom. [facility]

## 2023-07-13 NOTE — Telephone Encounter (Signed)
Copied from CRM [id number]. Topic: Clinical - Medication Refill >> Jul 13, 2023  9:42 AM Marlow Baars wrote: Most Recent Primary Care Visit:  Provider: Jonah Blue B  Department[facility]  Visit Type: OFFICE VISIT  Date: 05/02/2023  Medication: ezetimibe (ZETIA) 10 MG tablet, rosuvastatin (CRESTOR) 40 MG tablet  Has the patient contacted their pharmacy? Yes   Is this the correct pharmacy for this prescription? Yes If no, delete pharmacy and type the correct one.  This is the patient's preferred pharmacy:   [id number] - [place] ([place]), [place] - [address] [place] ([place]) [place] [address] Phone: [phone] Fax: [phone]    Has the prescription been filled recently? No  Is the patient out of the medication? No  Has the patient been seen for an appointment in the last year OR does the patient have an upcoming appointment? Yes  Can we respond through MyChart? Yes  Please assist patient further >> Jul 13, 2023 11:26 AM Nurse Etta Quill wrote: Patient requesting to add eliquis to be refilled per agent , Barbara Cower report

## 2023-07-13 NOTE — Telephone Encounter (Signed)
Requested Prescriptions  Pending Prescriptions Disp Refills   ezetimibe (ZETIA) 10 MG tablet 90 tablet 0    Sig: Take 1 tablet (10 mg total) by mouth daily.     Cardiovascular:  Antilipid - Sterol Transport Inhibitors Failed - 07/13/2023 11:33 AM      Failed - Lipid Panel in normal range within the last 12 months    Cholesterol, Total  Date Value Ref Range Status  12/27/2022 156 100 - 199 mg/dL Final   LDL Chol Calc (NIH)  Date Value Ref Range Status  12/27/2022 97 0 - 99 mg/dL Final   Direct LDL  Date Value Ref Range Status  05/04/2013 164.1 mg/dL Final    Comment:    Optimal:  <100 mg/dLNear or Above Optimal:  100-129 mg/dLBorderline High:  130-159 mg/dLHigh:  160-189 mg/dLVery High:  >190 mg/dL   HDL  Date Value Ref Range Status  12/27/2022 44 >39 mg/dL Final   Triglycerides  Date Value Ref Range Status  12/27/2022 80 0 - 149 mg/dL Final         Passed - AST in normal range and within 360 days    AST  Date Value Ref Range Status  12/27/2022 22 0 - 40 IU/L Final         Passed - ALT in normal range and within 360 days    ALT  Date Value Ref Range Status  12/27/2022 32 0 - 44 IU/L Final         Passed - Patient is not pregnant      Passed - Valid encounter within last 12 months    Recent Outpatient Visits           2 months ago Essential hypertension   [place] [facility] [place]. [facility] Marcine Matar, MD   3 months ago Exposure to gonorrhea   [place] [facility] [place] [facility] [place]. [facility] Hoy Register, MD   6 months ago Essential hypertension   [place] [facility] [place] [facility] Dept Of [place]. [facility] Marcine Matar, MD   11 months ago Dysuria   [place] [facility] Of [place]. [facility] Claiborne Rigg, NP   1 year ago Essential hypertension   [place] [facility] [place] [facility] Of [place]. [facility]  Marcine Matar, MD       Future Appointments             In 1 month Laural Benes Binnie Rail, MD [facility] [place] - A Dept Of Eligha Bridegroom. [facility]

## 2023-08-01 ENCOUNTER — Other Ambulatory Visit: Payer: Self-pay | Admitting: *Deleted

## 2023-08-01 ENCOUNTER — Other Ambulatory Visit: Payer: Self-pay | Admitting: Internal Medicine

## 2023-08-01 DIAGNOSIS — Z5181 Encounter for therapeutic drug level monitoring: Secondary | ICD-10-CM

## 2023-08-01 DIAGNOSIS — I2699 Other pulmonary embolism without acute cor pulmonale: Secondary | ICD-10-CM

## 2023-08-01 DIAGNOSIS — I824Y1 Acute embolism and thrombosis of unspecified deep veins of right proximal lower extremity: Secondary | ICD-10-CM

## 2023-08-01 DIAGNOSIS — M545 Low back pain, unspecified: Secondary | ICD-10-CM

## 2023-08-01 MED ORDER — WARFARIN SODIUM 5 MG PO TABS: ORAL_TABLET | ORAL | 0 refills | Status: DC

## 2023-08-01 NOTE — Telephone Encounter (Signed)
Warfarin 5mg  refill DVT, PE Last INR 07/01/23 Last OV 12/28/22

## 2023-08-03 ENCOUNTER — Telehealth: Payer: Self-pay | Admitting: *Deleted

## 2023-08-03 NOTE — Telephone Encounter (Signed)
Copied from CRM [id number]. Topic: Clinical - Prescription Issue >> Aug 03, 2023 10:47 AM Michael [name] wrote: Reason for CRM: Patient is needing his gabapentin refilled, pharmacy states they do not have it for him, he wants to be called to be advised what the hold up is on his refill as he is completely out at this time. Please advise.

## 2023-08-03 NOTE — Telephone Encounter (Signed)
Rx was sent today by PCP.

## 2023-08-09 ENCOUNTER — Ambulatory Visit: Payer: Medicare HMO | Attending: Internal Medicine | Admitting: Internal Medicine

## 2023-08-09 ENCOUNTER — Encounter: Payer: Self-pay | Admitting: Internal Medicine

## 2023-08-09 VITALS — BP 126/75 | HR 59 | Temp 98.3°F | Ht 69.0 in | Wt 223.0 lb

## 2023-08-09 DIAGNOSIS — R319 Hematuria, unspecified: Secondary | ICD-10-CM | POA: Diagnosis not present

## 2023-08-09 DIAGNOSIS — I10 Essential (primary) hypertension: Secondary | ICD-10-CM

## 2023-08-09 DIAGNOSIS — I723 Aneurysm of iliac artery: Secondary | ICD-10-CM

## 2023-08-09 DIAGNOSIS — E66811 Obesity, class 1: Secondary | ICD-10-CM | POA: Diagnosis not present

## 2023-08-09 DIAGNOSIS — R7303 Prediabetes: Secondary | ICD-10-CM | POA: Diagnosis not present

## 2023-08-09 DIAGNOSIS — E669 Obesity, unspecified: Secondary | ICD-10-CM

## 2023-08-09 DIAGNOSIS — I739 Peripheral vascular disease, unspecified: Secondary | ICD-10-CM

## 2023-08-09 DIAGNOSIS — Z86711 Personal history of pulmonary embolism: Secondary | ICD-10-CM

## 2023-08-09 DIAGNOSIS — I2510 Atherosclerotic heart disease of native coronary artery without angina pectoris: Secondary | ICD-10-CM

## 2023-08-09 DIAGNOSIS — M25511 Pain in right shoulder: Secondary | ICD-10-CM | POA: Diagnosis not present

## 2023-08-09 DIAGNOSIS — Z7901 Long term (current) use of anticoagulants: Secondary | ICD-10-CM

## 2023-08-09 MED ORDER — ROSUVASTATIN CALCIUM 40 MG PO TABS: 40.0000 mg | ORAL_TABLET | Freq: Every day | ORAL | 3 refills | Status: DC

## 2023-08-09 MED ORDER — LISINOPRIL 40 MG PO TABS: 40.0000 mg | ORAL_TABLET | Freq: Every day | ORAL | 0 refills | Status: DC

## 2023-08-09 NOTE — Progress Notes (Signed)
 Patient ID: Michael [name], male    DOB: 01-08-1961  MRN: [id number]  CC: Follow-up ([facility] f/u. Med refill. Michael [name] Zetia to determine if use if still necessary /Knot on R shoulder - requesting to see specialist /Instructed to fax imm rec from [place])   Subjective: Michael [name] is a 63 y.o. male who presents for chronic ds management. His significant other, Michael [name], is with him His concerns today include:  Patient with history of HTN, CAD, PAD, PE/DVT on Coumadin followed at cardiology anticoag clinic, HL, CVA by MRI 11/2018, mild carotid artery disease, prediabetes. Chronic lower back pain   CAD/PAD/HTN: Should be on lisinopril 40 mg daily, metoprolol 25 mg twice a day, Crestor 40 mg daily, Zetia and aspirin 81 mg daily.  Confirms taking these medications.  He wonders whether he still needs to be on Zetia. NO CP/SOB/LE edema  PreDM/Obesity: wgh up 5 lbs since last visit Cut out potatoes, mac and cheese, less bread; eating lots of chicken/fish/turkey. No beef Plays pickle ball 3x/wk  Hx DVT/PE: still on Coumadin with INR being following through anticoag clinic with cardiology. Last INR 3.  Patient seen in the emergency room in October of last year for chest pain.  He had a UA that showed moderate blood.  Abdominal CT showed no abnormalities in the bladder.  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  Subsequently seen at urgent care in December and had another POC UA that showed moderate blood.  He denies any gross hematuria or dysuria.    Request referral to specialist for RT shoulder Feels a knot at the top of shoulder x few wks. No increase in size, some mornings he does not feel it. It is not painful Patient Active Problem List   Diagnosis Date Noted   Prediabetes 12/04/2020   Obesity (BMI 30.0-34.9) 12/04/2020   DVT, lower extremity (HCC) 03/14/2019   History of pulmonary embolism 09/11/2018    Obsessive-compulsive disorder    Muscle cramps    Postoperative pain    Therapeutic opioid induced constipation    CKD (chronic kidney disease), stage II    Acute blood loss anemia    Debility    Ischemia of extremity 01/23/2018   Frequency of urination and polyuria 07/06/2017   Erectile dysfunction due to diseases classified elsewhere 10/27/2016   PAD (peripheral artery disease) (HCC) 08/11/2016   Nocturia 08/11/2016   Chronic pain of both knees 04/01/2016   Palpitations 05/05/2015   Hyperlipidemia 05/05/2015   HNP (herniated nucleus pulposus), cervical 10/14/2014   Chronic low back pain 10/14/2014   Numbness and tingling of both upper extremities while sleeping 10/14/2014   Frequent urination 09/03/2014   Lower abdominal pain 09/03/2014   Cervical stenosis of spine 05/16/2014   Essential hypertension 05/16/2014   Numbness and tingling of right arm 11/28/2013   Dental abscess 04/26/2013   Dysuria 04/26/2013   HTN (hypertension) 04/04/2013   CAD (coronary artery disease) 05/25/2011     Current Outpatient Medications on File Prior to Visit  Medication Sig Dispense Refill   aspirin EC 81 MG tablet Take 1 tablet (81 mg total) by mouth daily. 90 tablet 3   diclofenac Sodium (VOLTAREN) 1 % GEL Apply 2 g topically 2 (two) times daily as needed. 100 g 1   ezetimibe (ZETIA) 10 MG tablet Take 1 tablet (10 mg total) by mouth daily. 90 tablet 0   gabapentin (NEURONTIN) 300 MG capsule TAKE  1 CAPSULE BY MOUTH IN THE MORNING AND 2 IN THE EVENING 90 capsule 4   latanoprost (XALATAN) 0.005 % ophthalmic solution      metoprolol tartrate (LOPRESSOR) 25 MG tablet Take 1 tablet (25 mg total) by mouth 2 (two) times daily. 180 tablet 1   nitroGLYCERIN (NITROSTAT) 0.4 MG SL tablet Place 1 tablet (0.4 mg total) under the tongue every 5 (five) minutes as needed for chest pain. 75 tablet 2   ofloxacin (OCUFLOX) 0.3 % ophthalmic solution Place 1 drop into the left eye 4 (four) times daily. 5 mL 0    tamsulosin (FLOMAX) 0.4 MG CAPS capsule Take 1 capsule (0.4 mg total) by mouth at bedtime. 90 capsule 3   warfarin (COUMADIN) 5 MG tablet TAKE 1 TO 2  TABLETS BY MOUTH ONCE DAILY AS DIRECTED BY COUMADIN  CLINIC 150 tablet 0   No current facility-administered medications on file prior to visit.    Allergies  Allergen Reactions   Ivp Dye [Iodinated Contrast Media] Swelling    Social History   Socioeconomic History   Marital status: Significant Other    Spouse name: Not on file   Number of children: 0   Years of education: Not on file   Highest education level: Not on file  Occupational History   Not on file  Tobacco Use   Smoking status: Never   Smokeless tobacco: Never  Substance and Sexual Activity   Alcohol use: Not Currently    Comment: Drinks occassionally   Drug use: No   Sexual activity: Yes  Other Topics Concern   Not on file  Social History Narrative   Not on file   Social Drivers of Health   Financial Resource Strain: Low Risk  (03/29/2023)   Overall Financial Resource Strain (CARDIA)    Difficulty of Paying Living Expenses: Not hard at all  Food Insecurity: No Food Insecurity (03/29/2023)   Hunger Vital Sign    Worried About Running Out of Food in the Last Year: Never true    Ran Out of Food in the Last Year: Never true  Transportation Needs: No Transportation Needs (03/29/2023)   PRAPARE - Administrator, Civil Service (Medical): No    Lack of Transportation (Non-Medical): No  Physical Activity: Inactive (03/29/2023)   Exercise Vital Sign    Days of Exercise per Week: 0 days    Minutes of Exercise per Session: 0 min  Stress: No Stress Concern Present (03/29/2023)   [employer] of Occupational Health - Occupational Stress Questionnaire    Feeling of Stress : Not at all  Social Connections: Unknown (03/29/2023)   Social Connection and Isolation Panel [NHANES]    Frequency of Communication with Friends and Family: More than three times a  week    Frequency of Social Gatherings with Friends and Family: Three times a week    Attends Religious Services: 1 to 4 times per year    Active Member of Clubs or Organizations: No    Attends Banker Meetings: Never    Marital Status: Patient declined  Intimate Partner Violence: Not At Risk (03/29/2023)   Humiliation, Afraid, Rape, and Kick questionnaire    Fear of Current or Ex-Partner: No    Emotionally Abused: No    Physically Abused: No    Sexually Abused: No    Family History  Problem Relation Age of Onset   Cancer Maternal Aunt    Hyperlipidemia Other    Diabetes Other  Heart disease Other     Past Surgical History:  Procedure Laterality Date   COLONOSCOPY N/A 08/16/2014   Procedure: COLONOSCOPY;  Surgeon: West Bali, MD;  [facility];  Service: Endoscopy;  Laterality: N/A;  10:15 AM   DOPPLER ECHOCARDIOGRAPHY     Preserved left venticular function   EMBOLECTOMY Right 01/23/2018   Procedure: RIGHT POPLITEAL-TIBIAL  ARTERY EMBOLECTOMY, EXPLORATION OF RIGHT ANTERIOR TIBIAL ARTERY, RIGHT POPLITEAL ARTERY VEIN ANGIOPLASTY;  Surgeon: Sherren Kerns, MD;  Location: [facility] OR;  Service: Vascular;  Laterality: Right;   ENDARTERECTOMY POPLITEAL Left 01/23/2018   Procedure: ENDARTERECTOMY OF LEFT POPLITEAL ARTERY AND TIBIAL-PERONEAL TRUNK;  Surgeon: Sherren Kerns, MD;  Location[facility] OR;  Service: Vascular;  Laterality: Left;   FEMORAL-POPLITEAL BYPASS GRAFT Left 01/23/2018   Procedure: BYPASS GRAFT LEFT FEMORAL-POPLITEAL ARTERY WITH PROPATEN VASCULAR GRAFT;  Surgeon: Sherren Kerns, MD;  Location: [facility] OR;  Service: Vascular;  Laterality: Left;   HEMATOMA EVACUATION Left 01/25/2018   Procedure: EVACUATION HEMATOMA LEFT POPLITEAL SPACE;  Surgeon: Cephus Shelling, MD;  Location: [facility] OR;  Service: Vascular;  Laterality: Left;   LEG SURGERY     left leg - has rod and 4 pins   PATELLAR TENDON REPAIR Right 04/26/2014   Procedure: RIGHT PATELLA TENDON  REPAIR;  Surgeon: Sheral Apley, MD;  Location: [facility];  Service: Orthopedics;  Laterality: Right;    ROS: Review of Systems Negative except as stated above  PHYSICAL EXAM: BP 126/75 (BP Location: Left Arm, Patient Position: Sitting, Cuff Size: Large)   Pulse (!) 59   Temp 98.3 F (36.8 C) (Oral)   Ht 5\' 9"  (1.753 m)   Wt 223 lb (101.2 kg)   SpO2 98%   BMI 32.93 kg/m   Wt Readings from Last 3 Encounters:  08/09/23 223 lb (101.2 kg)  05/29/23 218 lb (98.9 kg)  05/02/23 218 lb (98.9 kg)    Physical Exam General appearance - alert, well appearing, older African-American male and in no distress Mental status - normal mood, behavior, speech, dress, motor activity, and thought processes Chest - clear to auscultation, no wheezes, rales or rhonchi, symmetric air entry Heart - normal rate, regular rhythm, normal S1, S2, no murmurs, rubs, clicks or gallops Musculoskeletal -right shoulder: Mild prominence/bony overgrowth where the clavicle meets the shoulder joint.  No point tenderness.  Good range of motion. Extremities - peripheral pulses normal, no pedal edema, no clubbing or cyanosis     Latest Ref Rng & Units 03/20/2023    6:27 PM 12/27/2022   11:43 AM 11/10/2021    7:24 AM  CMP  Glucose 70 - 99 mg/dL 94  99  308   BUN 8 - 23 mg/dL 15  16  19    Creatinine 0.61 - 1.24 mg/dL 6.57  8.46  9.62   Sodium 135 - 145 mmol/L 136  138  137   Potassium 3.5 - 5.1 mmol/L 3.8  4.4  4.5   Chloride 98 - 111 mmol/L 98  102  100   CO2 22 - 32 mmol/L 24  20  25    Calcium 8.9 - 10.3 mg/dL 9.1  9.2  9.2   Total Protein 6.0 - 8.5 g/dL  7.0  7.4   Total Bilirubin 0.0 - 1.2 mg/dL  <9.5  0.3   Alkaline Phos 44 - 121 IU/L  118  74   AST 0 - 40 IU/L  22  26   ALT 0 - 44  IU/L  32  36    Lipid Panel     Component Value Date/Time   CHOL 156 12/27/2022 1143   TRIG 80 12/27/2022 1143   HDL 44 12/27/2022 1143   CHOLHDL 3.5 12/27/2022 1143   CHOLHDL 3.6 08/11/2016 1030   VLDL 19  08/11/2016 1030   LDLCALC 97 12/27/2022 1143   LDLDIRECT 164.1 05/04/2013 1107    CBC    Component Value Date/Time   WBC 8.4 03/20/2023 1827   RBC 5.54 03/20/2023 1827   HGB 14.7 03/20/2023 1827   HGB 14.0 12/27/2022 1143   HCT 47.1 03/20/2023 1827   HCT 43.4 12/27/2022 1143   PLT 202 03/20/2023 1827   PLT 202 12/27/2022 1143   MCV 85.0 03/20/2023 1827   MCV 83 12/27/2022 1143   MCH 26.5 03/20/2023 1827   MCHC 31.2 03/20/2023 1827   RDW 14.5 03/20/2023 1827   RDW 13.3 12/27/2022 1143   LYMPHSABS 1.8 12/01/2018 1227   MONOABS 0.6 12/01/2018 1227   EOSABS 0.1 12/01/2018 1227   BASOSABS 0.0 12/01/2018 1227    ASSESSMENT AND PLAN:  1. PAD (peripheral artery disease) (HCC) (Primary) Stable.  Continue aspirin, Crestor and Zetia. - rosuvastatin (CRESTOR) 40 MG tablet; Take 1 tablet (40 mg total) by mouth daily.  Dispense: 90 tablet; Refill: 3 - Lipid panel  2. Aneurysm, common iliac artery (HCC) Will get him in with his vascular specialist to take a look at this. - Ambulatory referral to Vascular Surgery  3. Coronary artery disease involving native coronary artery of native heart without angina pectoris Stable.  Continue Crestor, Zetia, metoprolol and aspirin.  Last LDL was 97 which is not at goal of less than 55 given history of CAD and PAD.  For this reason I suggest that we continue the Zetia in combination with the Crestor.  We will recheck lipid profile today.  4. Prediabetes 5. Obesity (BMI 30.0-34.9) Commended him on dietary changes.  Encouraged him to keep up the good work.  Continue regular exercise. - Hemoglobin A1c  6. Hematuria, unspecified type Patient with hematuria on point-of-care urinalysis x 2.  He reports no gross hematuria.  We will send off a urinalysis today.  Further management will be based on results. - Urinalysis, Routine w reflex microscopic  7. Acute pain of right shoulder Advised patient that I think he has slight bony overgrowth where the  distal clavicle meets the shoulder joint.  I do not think this pose any issue for him.  However he wants to see the orthopedics so referral submitted. - AMB referral to orthopedics  8. Essential hypertension At goal.  Continue lisinopril 40 mg daily and metoprolol 25 mg twice a day. - lisinopril (ZESTRIL) 40 MG tablet; Take 1 tablet (40 mg total) by mouth daily.  Dispense: 90 tablet; Refill: 0  9. History of pulmonary embolism On Coumadin and followed by Coumadin clinic through cardiology.  Patient reports having received his second shingles vaccine and pneumonia vaccine through [place] [facility].  Message sent to my CMA to call them to get this information so we can update his health maintenance.  Patient was given the opportunity to ask questions.  Patient verbalized understanding of the plan and was able to repeat key elements of the plan.   This documentation was completed using Paediatric nurse.  Any transcriptional errors are unintentional.  Orders Placed This Encounter  Procedures   Lipid panel   Urinalysis, Routine w reflex microscopic   Hemoglobin  A1c   AMB referral to orthopedics   Ambulatory referral to Vascular Surgery     Requested Prescriptions   Signed Prescriptions Disp Refills   lisinopril (ZESTRIL) 40 MG tablet 90 tablet 0    Sig: Take 1 tablet (40 mg total) by mouth daily.   rosuvastatin (CRESTOR) 40 MG tablet 90 tablet 3    Sig: Take 1 tablet (40 mg total) by mouth daily.    Return in about 4 months (around 12/07/2023) for Cancel appt scheduled for March.  Jonah Blue, MD, FACP

## 2023-08-09 NOTE — Patient Instructions (Signed)
 Please fax your immunization record from [facility] to [facility] our fax number is [phone] so that we can add the information to your chart.

## 2023-08-10 ENCOUNTER — Encounter: Payer: Self-pay | Admitting: Internal Medicine

## 2023-08-10 ENCOUNTER — Other Ambulatory Visit: Payer: Self-pay | Admitting: Internal Medicine

## 2023-08-10 DIAGNOSIS — N029 Recurrent and persistent hematuria with unspecified morphologic changes: Secondary | ICD-10-CM

## 2023-08-10 LAB — MICROSCOPIC EXAMINATION
Bacteria, UA: NONE SEEN
Casts: NONE SEEN /[LPF]
Epithelial Cells (non renal): NONE SEEN /[HPF] (ref 0–10)
WBC, UA: NONE SEEN /[HPF] (ref 0–5)

## 2023-08-10 LAB — LIPID PANEL
Chol/HDL Ratio: 2.9 {ratio} (ref 0.0–5.0)
Cholesterol, Total: 115 mg/dL (ref 100–199)
HDL: 40 mg/dL (ref >39)
LDL Chol Calc (NIH): 61 mg/dL (ref 0–99)
Triglycerides: 63 mg/dL (ref 0–149)
VLDL Cholesterol Cal: 14 mg/dL (ref 5–40)

## 2023-08-10 LAB — URINALYSIS, ROUTINE W REFLEX MICROSCOPIC
Bilirubin, UA: NEGATIVE
Glucose, UA: NEGATIVE
Ketones, UA: NEGATIVE
Leukocytes,UA: NEGATIVE
Nitrite, UA: NEGATIVE
Protein,UA: NEGATIVE
Specific Gravity, UA: 1.025 (ref 1.005–1.030)
Urobilinogen, Ur: 0.2 mg/dL (ref 0.2–1.0)
pH, UA: 5.5 (ref 5.0–7.5)

## 2023-08-10 LAB — HEMOGLOBIN A1C
Est. average glucose Bld gHb Est-mCnc: 134 mg/dL
Hgb A1c MFr Bld: 6.3 % — ABNORMAL HIGH (ref 4.8–5.6)

## 2023-08-12 ENCOUNTER — Ambulatory Visit: Payer: Medicare HMO | Attending: Cardiovascular Disease | Admitting: *Deleted

## 2023-08-12 DIAGNOSIS — Z5181 Encounter for therapeutic drug level monitoring: Secondary | ICD-10-CM | POA: Diagnosis not present

## 2023-08-12 DIAGNOSIS — I824Y1 Acute embolism and thrombosis of unspecified deep veins of right proximal lower extremity: Secondary | ICD-10-CM | POA: Diagnosis not present

## 2023-08-12 DIAGNOSIS — I2699 Other pulmonary embolism without acute cor pulmonale: Secondary | ICD-10-CM

## 2023-08-12 LAB — POCT INR: INR: 3.4 — AB (ref 2.0–3.0)

## 2023-08-12 NOTE — Patient Instructions (Addendum)
 Description   Do not take any warfarin tomorrow then continue taking Warfarin 1 tablet daily except for 2 tablets on Mondays, Wednesdays and Fridays. Recheck INR in 5 weeks. Have some leafy veggies today and keep dark green leafy veggies consistent in your diet at least 2-3 times in your diet (collards, mixed greens, cabbage, broccoli, or spinach). Coumadin Clinic [phone] or [phone].

## 2023-08-19 DIAGNOSIS — R351 Nocturia: Secondary | ICD-10-CM | POA: Diagnosis not present

## 2023-08-19 DIAGNOSIS — R3121 Asymptomatic microscopic hematuria: Secondary | ICD-10-CM | POA: Diagnosis not present

## 2023-08-19 DIAGNOSIS — Z125 Encounter for screening for malignant neoplasm of prostate: Secondary | ICD-10-CM | POA: Diagnosis not present

## 2023-08-19 DIAGNOSIS — N401 Enlarged prostate with lower urinary tract symptoms: Secondary | ICD-10-CM | POA: Diagnosis not present

## 2023-08-19 DIAGNOSIS — R350 Frequency of micturition: Secondary | ICD-10-CM | POA: Diagnosis not present

## 2023-08-23 ENCOUNTER — Other Ambulatory Visit (INDEPENDENT_AMBULATORY_CARE_PROVIDER_SITE_OTHER): Payer: Self-pay

## 2023-08-23 ENCOUNTER — Encounter: Payer: Self-pay | Admitting: Physician Assistant

## 2023-08-23 ENCOUNTER — Ambulatory Visit (INDEPENDENT_AMBULATORY_CARE_PROVIDER_SITE_OTHER): Admitting: Physician Assistant

## 2023-08-23 ENCOUNTER — Ambulatory Visit: Payer: Medicare HMO | Admitting: Physician Assistant

## 2023-08-23 DIAGNOSIS — G8929 Other chronic pain: Secondary | ICD-10-CM | POA: Diagnosis not present

## 2023-08-23 DIAGNOSIS — M25511 Pain in right shoulder: Secondary | ICD-10-CM

## 2023-08-23 NOTE — Progress Notes (Signed)
 Office Visit Note   Patient: Michael [name]           Date of Birth: October 07, 1960           MRN: [id number] Visit Date: 08/23/2023              Requested by: Marcine Matar, MD [address] [place],  [place] [address] PCP: Marcine Matar, MD   Assessment & Plan: Visit Diagnoses:  1. 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  Fortunately, the patient does not have any associated pain.  I have offered him aspiration and cortisone injection but he has politely declined.  He has decided he would like to back off on the number of games he plays which she thinks will help.  If this becomes painful, he will call us we will likely make a referral to Dr. Shon Baton for [facility] joint aspiration/cortisone injection.  Otherwise, follow-up as needed.   Follow-Up Instructions: Return if symptoms worsen or fail to improve.   Orders:  Orders Placed This Encounter  Procedures   XR Shoulder Right   No orders of the defined types were placed in this encounter.     Procedures: No procedures performed   Clinical Data: No additional findings.   Subjective: Chief Complaint  Patient presents with   Right Shoulder - Pain    HPI patient is a pleasant 63 year old gentleman who comes in today with concerns about swelling to the top of his right shoulder.  He noticed this recently after he began playing pickle ball.  He is playing twice a week.  Symptoms begin after he has been playing 6+ games.  He denies any pain but swelling to the [facility] joint.  This does resolve the following day.  Of note, he was seen by [place] about a year and a half ago following an injury to his right shoulder.  He tells me that all of this pain has resolved.  Review of Systems as detailed in HPI.  All others reviewed and are negative.   Objective: Vital Signs: There were no vitals taken for this visit.  Physical Exam well-developed  well-nourished gentleman in no acute distress.  Alert and oriented x 3.  Ortho Exam right shoulder exam reveals full active range of motion in all planes.  He does have a prominence to the [facility] joint.  This is nontender.  He is neurovascularly intact distally.  Specialty Comments:  No specialty comments available.  Imaging: XR Shoulder Right Result Date: 08/23/2023 X-rays demonstrate degenerative changes to the before meals and glenohumeral joints.  He does have superior osteophyte to the distal clavicle.  No superior migration of the humeral head.    PMFS History: Patient Active Problem List   Diagnosis Date Noted   Prediabetes 12/04/2020   Obesity (BMI 30.0-34.9) 12/04/2020   DVT, lower extremity (HCC) 03/14/2019   History of pulmonary embolism 09/11/2018   Obsessive-compulsive disorder    Muscle cramps    Postoperative pain    Therapeutic opioid induced constipation    CKD (chronic kidney disease), stage II    Acute blood loss anemia    Debility    Ischemia of extremity 01/23/2018   Frequency of urination and polyuria 07/06/2017   Erectile dysfunction due to diseases classified elsewhere 10/27/2016   PAD (peripheral artery disease) (HCC) 08/11/2016   Nocturia 08/11/2016   Chronic pain of both knees 04/01/2016  Palpitations 05/05/2015   Hyperlipidemia 05/05/2015   HNP (herniated nucleus pulposus), cervical 10/14/2014   Chronic low back pain 10/14/2014   Numbness and tingling of both upper extremities while sleeping 10/14/2014   Frequent urination 09/03/2014   Lower abdominal pain 09/03/2014   Cervical stenosis of spine 05/16/2014   Essential hypertension 05/16/2014   Numbness and tingling of right arm 11/28/2013   Dental abscess 04/26/2013   Dysuria 04/26/2013   HTN (hypertension) 04/04/2013   CAD (coronary artery disease) 05/25/2011   Past Medical History:  Diagnosis Date   Alcohol abuse    Anterior myocardial infarction [facility])    ST-elevation; S/P emergent   drug-eluting stenting of proximal left anterior descending   Coronary artery disease    a. STEMI 2012 - s/p DES to LAD with  diffuse distal disease in the right posterolateral branch. The third OM was occluded and filled from left to left collaterals. LVEF preserved.   DVT complicating pregnancy    Dysuria 04/26/2013   Essential hypertension 05/16/2014   Frequent urination 09/03/2014   HNP (herniated nucleus pulposus), cervical 10/14/2014   Hyperlipidemia    Hypertension    Ischemia of extremity 01/23/2018   Lower abdominal pain 09/03/2014   PAD (peripheral artery disease) (HCC)    Pre-diabetes    Premature atrial contraction    PSVT (paroxysmal supraventricular tachycardia) (HCC)    2 short runs on monitor 06/2019   Pulmonary emboli (HCC)    PVC's (premature ventricular contractions)    Renal insufficiency    Special screening for malignant neoplasms, colon     Family History  Problem Relation Age of Onset   Cancer Maternal Aunt    Hyperlipidemia Other    Diabetes Other    Heart disease Other     Past Surgical History:  Procedure Laterality Date   COLONOSCOPY N/A 08/16/2014   Procedure: COLONOSCOPY;  Surgeon: West Bali, MD;  [facility];  Service: Endoscopy;  Laterality: N/A;  10:15 AM   DOPPLER ECHOCARDIOGRAPHY     Preserved left venticular function   EMBOLECTOMY Right 01/23/2018   Procedure: RIGHT POPLITEAL-TIBIAL  ARTERY EMBOLECTOMY, EXPLORATION OF RIGHT ANTERIOR TIBIAL ARTERY, RIGHT POPLITEAL ARTERY VEIN ANGIOPLASTY;  Surgeon: Sherren Kerns, MD;  Location: [facility] OR;  Service: Vascular;  Laterality: Right;   ENDARTERECTOMY POPLITEAL Left 01/23/2018   Procedure: ENDARTERECTOMY OF LEFT POPLITEAL ARTERY AND TIBIAL-PERONEAL TRUNK;  Surgeon: Sherren Kerns, MD;  [facility];  Service: Vascular;  Laterality: Left;   FEMORAL-POPLITEAL BYPASS GRAFT Left 01/23/2018   Procedure: BYPASS GRAFT LEFT FEMORAL-POPLITEAL ARTERY WITH PROPATEN VASCULAR GRAFT;  Surgeon: Sherren Kerns, MD;  Location: [facility] OR;  Service: Vascular;  Laterality: Left;   HEMATOMA EVACUATION Left 01/25/2018   Procedure: EVACUATION HEMATOMA LEFT POPLITEAL SPACE;  Surgeon: Cephus Shelling, MD;  Location: [facility] OR;  Service: Vascular;  Laterality: Left;   LEG SURGERY     left leg - has rod and 4 pins   PATELLAR TENDON REPAIR Right 04/26/2014   Procedure: RIGHT PATELLA TENDON REPAIR;  Surgeon: Sheral Apley, MD;  Location: [facility];  Service: Orthopedics;  Laterality: Right;   Social History   Occupational History   Not on file  Tobacco Use   Smoking status: Never   Smokeless tobacco: Never  Substance and Sexual Activity   Alcohol use: Not Currently    Comment: Drinks occassionally   Drug use: No   Sexual activity: Yes

## 2023-08-27 ENCOUNTER — Other Ambulatory Visit: Payer: Self-pay | Admitting: Cardiovascular Disease

## 2023-08-27 ENCOUNTER — Other Ambulatory Visit: Payer: Self-pay | Admitting: Internal Medicine

## 2023-08-27 DIAGNOSIS — Z5181 Encounter for therapeutic drug level monitoring: Secondary | ICD-10-CM

## 2023-08-27 DIAGNOSIS — I2699 Other pulmonary embolism without acute cor pulmonale: Secondary | ICD-10-CM

## 2023-08-27 DIAGNOSIS — I824Y1 Acute embolism and thrombosis of unspecified deep veins of right proximal lower extremity: Secondary | ICD-10-CM

## 2023-08-30 ENCOUNTER — Ambulatory Visit: Payer: Medicare HMO | Admitting: Internal Medicine

## 2023-09-06 DIAGNOSIS — I723 Aneurysm of iliac artery: Secondary | ICD-10-CM | POA: Diagnosis not present

## 2023-09-06 DIAGNOSIS — R3121 Asymptomatic microscopic hematuria: Secondary | ICD-10-CM | POA: Diagnosis not present

## 2023-09-06 DIAGNOSIS — N281 Cyst of kidney, acquired: Secondary | ICD-10-CM | POA: Diagnosis not present

## 2023-09-06 DIAGNOSIS — K5730 Diverticulosis of large intestine without perforation or abscess without bleeding: Secondary | ICD-10-CM | POA: Diagnosis not present

## 2023-09-06 DIAGNOSIS — K449 Diaphragmatic hernia without obstruction or gangrene: Secondary | ICD-10-CM | POA: Diagnosis not present

## 2023-09-09 ENCOUNTER — Other Ambulatory Visit: Payer: Self-pay

## 2023-09-09 DIAGNOSIS — I714 Abdominal aortic aneurysm, without rupture, unspecified: Secondary | ICD-10-CM

## 2023-09-16 ENCOUNTER — Ambulatory Visit: Payer: Medicare HMO | Attending: Cardiovascular Disease

## 2023-09-16 DIAGNOSIS — Z5181 Encounter for therapeutic drug level monitoring: Secondary | ICD-10-CM

## 2023-09-16 DIAGNOSIS — I824Y1 Acute embolism and thrombosis of unspecified deep veins of right proximal lower extremity: Secondary | ICD-10-CM | POA: Diagnosis not present

## 2023-09-16 DIAGNOSIS — I2699 Other pulmonary embolism without acute cor pulmonale: Secondary | ICD-10-CM

## 2023-09-16 LAB — POCT INR: INR: 2.3 (ref 2.0–3.0)

## 2023-09-16 NOTE — Patient Instructions (Signed)
 continue taking Warfarin 1 tablet daily except for 2 tablets on Mondays, Wednesdays and Fridays. Recheck INR in 6 weeks. Have some leafy veggies today and keep dark green leafy veggies consistent in your diet at least 2-3 times in your diet (collards, mixed greens, cabbage, broccoli, or spinach). Coumadin Clinic [phone] or [phone].

## 2023-09-22 ENCOUNTER — Ambulatory Visit: Payer: Medicare HMO | Admitting: Vascular Surgery

## 2023-09-22 ENCOUNTER — Other Ambulatory Visit (HOSPITAL_COMMUNITY): Payer: Medicare HMO

## 2023-10-11 ENCOUNTER — Telehealth: Payer: Self-pay | Admitting: *Deleted

## 2023-10-11 DIAGNOSIS — R3121 Asymptomatic microscopic hematuria: Secondary | ICD-10-CM | POA: Diagnosis not present

## 2023-10-11 DIAGNOSIS — D414 Neoplasm of uncertain behavior of bladder: Secondary | ICD-10-CM | POA: Diagnosis not present

## 2023-10-11 DIAGNOSIS — D4102 Neoplasm of uncertain behavior of left kidney: Secondary | ICD-10-CM | POA: Diagnosis not present

## 2023-10-11 NOTE — Telephone Encounter (Signed)
 Pt called to report he will be started on an oral antibiotic soon for an infection. Advised that once he gets the medication to call back so we can discuss if it interacts with warfarin and he verbalized understanding.He states he will pick it up tonight & call back in the morning.

## 2023-10-12 ENCOUNTER — Other Ambulatory Visit: Payer: Self-pay | Admitting: Urology

## 2023-10-12 ENCOUNTER — Telehealth: Payer: Self-pay | Admitting: Cardiovascular Disease

## 2023-10-12 ENCOUNTER — Telehealth: Payer: Self-pay | Admitting: *Deleted

## 2023-10-12 NOTE — Telephone Encounter (Addendum)
 Patient called to report that he is taking flagyl  500mg  twice a day and started last night and for 7 days. Advised pt that this does interact with warfarin and not to take any warfarin today then take 1 tablet of warfarin until appt on Friday.   Pt has been on flagyl  and cipro  in the past and INR was over 6.0; will have pt not take any warfarin today.   Pt returned call and stated that he will also have a procedure by Urology 12/19/23 at 730am at [facility] and will have to hold warfarin. Advised we will need a clearance form faxed over to preop [phone] or [phone]) to get approval.

## 2023-10-12 NOTE — Telephone Encounter (Signed)
   Name: Michael [name]  DOB: 1961-05-04  MRN: [id number]  Primary Cardiologist: Antoinette Batman, MD  Chart reviewed as part of pre-operative protocol coverage. Because of [name] past medical history and time since last visit, he will require a follow-up in-office visit in order to better assess preoperative cardiovascular risk.  Last OV 12/2022 - he will be due for 1 year follow-up around time of surgery. Would go ahead and get scheduled for June to allow time for testing if needed.  Pre-op covering staff: - Please schedule appointment and call patient to inform them. If patient already had an upcoming appointment within acceptable timeframe, please add "pre-op clearance" to the appointment notes so provider is aware. - Please contact requesting surgeon's office via preferred method (i.e, phone, fax) to inform them of need for appointment prior to surgery.  This message will also be routed to pharmacy pool for holding warfarin and Dr. Abel Hoe for input on holding aspirin  as requested below so that this information is available to the clearing provider at time of patient's appointment. (Falls outside DAPT algorithm given residual disease, prox LAD disease, concomitant vascular surgery prompting MD input).  Dr. Abel Hoe - - Please route clearance on holding ASA pre-operatively to [facility] PREOP (the pre-op pool). We will plan to see back in clinic to finalize recs.  [name] [name], PA-C  10/12/2023, 10:09 AM

## 2023-10-12 NOTE — Telephone Encounter (Signed)
 Tried calling patient to schedule in person office visit for preop clearance no answer left a detailed message to call back. Please schedule patient around June surgery is scheduled for 7/7 please schedule couple weeks in advance in case patient needs any test done

## 2023-10-12 NOTE — Telephone Encounter (Signed)
   Pre-operative Risk Assessment    Patient Name: Michael [name]  DOB: 1960/07/06 MRN: [id number]   Date of last office visit: 12/28/22 Date of next office visit: N/A   Request for Surgical Clearance    Procedure:   Resection of Bladder Chamber  Date of Surgery:  Clearance 12/19/23                                Surgeon:  Dr. Leila Punt Surgeon's Group or Practice Name:  [facility] Phone number:  [phone] Ext. [phone] Fax number:  [phone]   Type of Clearance Requested:   - Medical  - Pharmacy:  Hold Warfarin (Coumadin ) and aspirin  EC 81 MG tablet TBD by cardiologist   Type of Anesthesia:  General    Additional requests/questions:  Please fax a copy of medical clearance to the surgeon's office.  [name]   10/12/2023, 9:55 AM

## 2023-10-13 ENCOUNTER — Other Ambulatory Visit: Payer: Self-pay | Admitting: Urology

## 2023-10-13 DIAGNOSIS — D4102 Neoplasm of uncertain behavior of left kidney: Secondary | ICD-10-CM

## 2023-10-13 NOTE — Telephone Encounter (Signed)
 Patient already has a scheduled office visit with Hao Meng, PA-C on 6/11 for pre-clearance. I will fax over cardiac clearance updates to requesting office.

## 2023-10-14 ENCOUNTER — Ambulatory Visit: Attending: Cardiovascular Disease

## 2023-10-14 DIAGNOSIS — I824Y1 Acute embolism and thrombosis of unspecified deep veins of right proximal lower extremity: Secondary | ICD-10-CM

## 2023-10-14 DIAGNOSIS — I2699 Other pulmonary embolism without acute cor pulmonale: Secondary | ICD-10-CM

## 2023-10-14 DIAGNOSIS — Z5181 Encounter for therapeutic drug level monitoring: Secondary | ICD-10-CM

## 2023-10-14 LAB — POCT INR: INR: 1.8 — AB (ref 2.0–3.0)

## 2023-10-14 NOTE — Patient Instructions (Signed)
 HOLD TODAY ONLY THEN continue taking Warfarin 1 tablet daily except for 2 tablets on Mondays, Wednesdays and Fridays. Recheck INR in 2 weeks. Have some leafy veggies today and keep dark green leafy veggies consistent in your diet at least 2-3 times in your diet (collards, mixed greens, cabbage, broccoli, or spinach). Coumadin  Clinic [phone] or [phone].

## 2023-10-17 NOTE — Telephone Encounter (Signed)
 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    Procedure:   Resection of Bladder Chamber   Date of Surgery:  Clearance 12/19/23    Patient with history of PE/DVT 01/2018 with no recurrence.    CrCl 88 Platelet count 202  Per office protocol, patient can hold warfarin for 5 days prior to procedure.   Patient will not need bridging with Lovenox (enoxaparin) around procedure.  **This guidance is not considered finalized until pre-operative APP has relayed final recommendations.**

## 2023-10-18 ENCOUNTER — Other Ambulatory Visit: Payer: Self-pay | Admitting: Internal Medicine

## 2023-10-20 ENCOUNTER — Encounter: Payer: Self-pay | Admitting: Urology

## 2023-10-25 ENCOUNTER — Ambulatory Visit: Attending: Cardiovascular Disease

## 2023-10-25 DIAGNOSIS — I824Y1 Acute embolism and thrombosis of unspecified deep veins of right proximal lower extremity: Secondary | ICD-10-CM | POA: Diagnosis not present

## 2023-10-25 DIAGNOSIS — Z5181 Encounter for therapeutic drug level monitoring: Secondary | ICD-10-CM

## 2023-10-25 DIAGNOSIS — I2699 Other pulmonary embolism without acute cor pulmonale: Secondary | ICD-10-CM

## 2023-10-25 LAB — POCT INR: INR: 2.7 (ref 2.0–3.0)

## 2023-10-25 NOTE — Patient Instructions (Signed)
 continue taking Warfarin 1 tablet daily except for 2 tablets on Mondays, Wednesdays and Fridays. Recheck INR in 4 weeks. Have some leafy veggies today and keep dark green leafy veggies consistent in your diet at least 2-3 times in your diet (collards, mixed greens, cabbage, broccoli, or spinach). Coumadin  Clinic [phone] or [phone].

## 2023-11-02 ENCOUNTER — Telehealth: Payer: Self-pay | Admitting: *Deleted

## 2023-11-02 NOTE — Telephone Encounter (Signed)
 Returned call to the pt after receiving a voicemail from him; there was no answer so left him a message.

## 2023-11-03 ENCOUNTER — Telehealth: Payer: Self-pay | Admitting: *Deleted

## 2023-11-03 NOTE — Telephone Encounter (Signed)
 Pt called and LMOM. Called pt back. Pt wondering if Melatonin will affect his warfarin. Informed pt that according to [employer]rom[employer] that there is a moderate risk of an increased risk of bleeding. Pt verbalized understanding and stated that he would like to keep is appointment on 11/23/2023 instead of coming in sooner.

## 2023-11-09 NOTE — Progress Notes (Deleted)
 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 (December 31, 1960) male presenting at the request of .Lawrance Presume, MD ***  The pt is *** on a statin for cholesterol management.  The pt is *** on a daily aspirin .   Other AC:  *** The pt is *** on medication for hypertension.   The pt is *** diabetic.  Tobacco hx:  ***  Past Medical History:  Diagnosis Date   Alcohol abuse    Anterior myocardial infarction [facility])    ST-elevation; S/P emergent  drug-eluting stenting of proximal left anterior descending   Coronary artery disease    a. STEMI 2012 - s/p DES to LAD with  diffuse distal disease in the right posterolateral branch. The third OM was occluded and filled from left to left collaterals. LVEF preserved.   DVT complicating pregnancy    Dysuria 04/26/2013   Essential hypertension 05/16/2014   Frequent urination 09/03/2014   HNP (herniated nucleus pulposus), cervical 10/14/2014   Hyperlipidemia    Hypertension    Ischemia of extremity 01/23/2018   Lower abdominal pain 09/03/2014   PAD (peripheral artery disease) (HCC)    Pre-diabetes    Premature atrial contraction    PSVT (paroxysmal supraventricular tachycardia) (HCC)    2 short runs on monitor 06/2019   Pulmonary emboli (HCC)    PVC's (premature ventricular contractions)    Renal insufficiency    Special screening for malignant neoplasms, colon     Past Surgical History:  Procedure Laterality Date   COLONOSCOPY N/A 08/16/2014   Procedure: COLONOSCOPY;  Surgeon: Alyce Jubilee, MD;  [facility];  Service: Endoscopy;  Laterality: N/A;  10:15 AM   DOPPLER ECHOCARDIOGRAPHY     Preserved left venticular function   EMBOLECTOMY Right 01/23/2018   Procedure: RIGHT POPLITEAL-TIBIAL  ARTERY EMBOLECTOMY, EXPLORATION OF RIGHT ANTERIOR TIBIAL ARTERY, RIGHT POPLITEAL ARTERY VEIN ANGIOPLASTY;  Surgeon: Richrd Char, MD;  Location: [facility] OR;   Service: Vascular;  Laterality: Right;   ENDARTERECTOMY POPLITEAL Left 01/23/2018   Procedure: ENDARTERECTOMY OF LEFT POPLITEAL ARTERY AND TIBIAL-PERONEAL TRUNK;  Surgeon: Richrd Char, MD;  Location: [facility] OR;  Service: Vascular;  Laterality: Left;   FEMORAL-POPLITEAL BYPASS GRAFT Left 01/23/2018   Procedure: BYPASS GRAFT LEFT FEMORAL-POPLITEAL ARTERY WITH PROPATEN VASCULAR GRAFT;  Surgeon: Richrd Char, MD;  Location: [facility] OR;  Service: Vascular;  Laterality: Left;   HEMATOMA EVACUATION Left 01/25/2018   Procedure: EVACUATION HEMATOMA LEFT POPLITEAL SPACE;  Surgeon: Young Hensen, MD;  Location: [facility] OR;  Service: Vascular;  Laterality: Left;   LEG SURGERY     left leg - has rod and 4 pins   PATELLAR TENDON REPAIR Right 04/26/2014   Procedure: RIGHT PATELLA TENDON REPAIR;  Surgeon: Saundra Curl, MD;  Location: [facility];  Service: Orthopedics;  Laterality: Right;    Social History   Socioeconomic History   Marital status: Significant Other    Spouse name: Not on file   Number of children: 0   Years of education: Not on file   Highest education level: Not on file  Occupational History   Not on file  Tobacco Use   Smoking status: Never   Smokeless tobacco: Never  Substance and Sexual Activity   Alcohol use: Not Currently    Comment: Drinks occassionally   Drug use: No   Sexual activity: Yes  Other Topics  Concern   Not on file  Social History Narrative   Not on file   Social Drivers of Health   Financial Resource Strain: Low Risk  (03/29/2023)   Overall Financial Resource Strain (CARDIA)    Difficulty of Paying Living Expenses: Not hard at all  Food Insecurity: No Food Insecurity (03/29/2023)   Hunger Vital Sign    Worried About Running Out of Food in the Last Year: Never true    Ran Out of Food in the Last Year: Never true  Transportation Needs: No Transportation Needs (03/29/2023)   PRAPARE - Administrator, Civil Service  (Medical): No    Lack of Transportation (Non-Medical): No  Physical Activity: Inactive (03/29/2023)   Exercise Vital Sign    Days of Exercise per Week: 0 days    Minutes of Exercise per Session: 0 min  Stress: No Stress Concern Present (03/29/2023)   [employer] of Occupational Health - Occupational Stress Questionnaire    Feeling of Stress : Not at all  Social Connections: Unknown (03/29/2023)   Social Connection and Isolation Panel [NHANES]    Frequency of Communication with Friends and Family: More than three times a week    Frequency of Social Gatherings with Friends and Family: Three times a week    Attends Religious Services: 1 to 4 times per year    Active Member of Clubs or Organizations: No    Attends Banker Meetings: Never    Marital Status: Patient declined  Intimate Partner Violence: Not At Risk (03/29/2023)   Humiliation, Afraid, Rape, and Kick questionnaire    Fear of Current or Ex-Partner: No    Emotionally Abused: No    Physically Abused: No    Sexually Abused: No   *** Family History  Problem Relation Age of Onset   Cancer Maternal Aunt    Hyperlipidemia Other    Diabetes Other    Heart disease Other     Current Outpatient Medications  Medication Sig Dispense Refill   aspirin  EC 81 MG tablet Take 1 tablet (81 mg total) by mouth daily. 90 tablet 3   diclofenac  Sodium (VOLTAREN ) 1 % GEL Apply 2 g topically 2 (two) times daily as needed. 100 g 1   ezetimibe  (ZETIA ) 10 MG tablet Take 1 tablet by mouth once daily 90 tablet 0   gabapentin  (NEURONTIN ) 300 MG capsule TAKE 1 CAPSULE BY MOUTH IN THE MORNING AND 2 IN THE EVENING 90 capsule 4   latanoprost (XALATAN) 0.005 % ophthalmic solution      lisinopril  (ZESTRIL ) 40 MG tablet Take 1 tablet (40 mg total) by mouth daily. 90 tablet 0   metoprolol  tartrate (LOPRESSOR ) 25 MG tablet Take 1 tablet (25 mg total) by mouth 2 (two) times daily. 180 tablet 1   nitroGLYCERIN  (NITROSTAT ) 0.4 MG SL tablet  Place 1 tablet (0.4 mg total) under the tongue every 5 (five) minutes as needed for chest pain. 75 tablet 2   ofloxacin  (OCUFLOX ) 0.3 % ophthalmic solution Place 1 drop into the left eye 4 (four) times daily. 5 mL 0   rosuvastatin  (CRESTOR ) 40 MG tablet Take 1 tablet (40 mg total) by mouth daily. 90 tablet 3   tamsulosin  (FLOMAX ) 0.4 MG CAPS capsule Take 1 capsule (0.4 mg total) by mouth at bedtime. 90 capsule 3   warfarin (COUMADIN ) 5 MG tablet TAKE 1 TO 2 TABLETS BY MOUTH ONCE DAILY AS DIRECTED BY  THE  COUMADIN   CLINIC 150 tablet 0  No current facility-administered medications for this visit.    Allergies  Allergen Reactions   Ivp Dye [Iodinated Contrast Media] Swelling     REVIEW OF SYSTEMS:  *** [X]  denotes positive finding, [ ]  denotes negative finding Cardiac  Comments:  Chest pain or chest pressure:    Shortness of breath upon exertion:    Short of breath when lying flat:    Irregular heart rhythm:        Vascular    Pain in calf, thigh, or hip brought on by ambulation:    Pain in feet at night that wakes you up from your sleep:     Blood clot in your veins:    Leg swelling:         Pulmonary    Oxygen at home:    Productive cough:     Wheezing:         Neurologic    Sudden weakness in arms or legs:     Sudden numbness in arms or legs:     Sudden onset of difficulty speaking or slurred speech:    Temporary loss of vision in one eye:     Problems with dizziness:         Gastrointestinal    Blood in stool:     Vomited blood:         Genitourinary    Burning when urinating:     Blood in urine:        Psychiatric    Major depression:         Hematologic    Bleeding problems:    Problems with blood clotting too easily:        Skin    Rashes or ulcers:        Constitutional    Fever or chills:      PHYSICAL EXAMINATION:  There were no vitals filed for this visit.  General:  WDWN in NAD; vital signs documented above Gait: Not observed HENT: WNL,  normocephalic Pulmonary: normal non-labored breathing , without wheezing Cardiac: {Desc; regular/irreg:14544} HR Abdomen: soft, NT, no masses Skin: {With/Without:20273} rashes Vascular Exam/Pulses:  Right Left  Radial {Exam; arterial pulse strength 0-4:30167} {Exam; arterial pulse strength 0-4:30167}  Ulnar {Exam; arterial pulse strength 0-4:30167} {Exam; arterial pulse strength 0-4:30167}  Femoral {Exam; arterial pulse strength 0-4:30167} {Exam; arterial pulse strength 0-4:30167}  Popliteal {Exam; arterial pulse strength 0-4:30167} {Exam; arterial pulse strength 0-4:30167}  DP {Exam; arterial pulse strength 0-4:30167} {Exam; arterial pulse strength 0-4:30167}  PT {Exam; arterial pulse strength 0-4:30167} {Exam; arterial pulse strength 0-4:30167}   Extremities: {With/Without:20273} ischemic changes, {With/Without:20273} Gangrene , {With/Without:20273} cellulitis; {With/Without:20273} open wounds;  Musculoskeletal: no muscle wasting or atrophy  Neurologic: A&O X 3;  No focal weakness or paresthesias are detected Psychiatric:  The pt has {Desc; normal/abnormal:11317::"Normal"} affect.   Non-Invasive Vascular Imaging:   ***    ASSESSMENT/PLAN: Michael [name] is a 63 y.o. 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  Prior imaging was reviewed including CT scan performed in 2019.  At that time, it measured 2.6 cm. I had a long conversation with Michael [name] regarding his right common iliac artery aneurysm, as well as the natural history.  He has had hardly any growth since 2019.  I think that this can be safely followed on a yearly basis.    Michael [name] was comfortable this plan moving forward.  We discussed the signs and symptoms of rupture.  I asked him to call [phone] should  any of these occur.  At his current size, this is extremely very unlikely.   Kayla Part, MD [facility] [phone]

## 2023-11-10 ENCOUNTER — Ambulatory Visit: Attending: Vascular Surgery | Admitting: Vascular Surgery

## 2023-11-10 ENCOUNTER — Ambulatory Visit (HOSPITAL_COMMUNITY): Admission: RE | Admit: 2023-11-10 | Source: Ambulatory Visit

## 2023-11-16 ENCOUNTER — Telehealth: Admitting: Physician Assistant

## 2023-11-16 ENCOUNTER — Ambulatory Visit: Payer: Self-pay

## 2023-11-16 ENCOUNTER — Other Ambulatory Visit (HOSPITAL_COMMUNITY)
Admission: RE | Admit: 2023-11-16 | Discharge: 2023-11-16 | Disposition: A | Discharge status: Home / Self Care | Source: Ambulatory Visit | Attending: Physician Assistant | Admitting: Physician Assistant

## 2023-11-16 DIAGNOSIS — Z113 Encounter for screening for infections with a predominantly sexual mode of transmission: Secondary | ICD-10-CM | POA: Insufficient documentation

## 2023-11-16 DIAGNOSIS — Z5321 Procedure and treatment not carried out due to patient leaving prior to being seen by health care provider: Secondary | ICD-10-CM

## 2023-11-16 NOTE — Telephone Encounter (Signed)
  FYI Only or Action Required?: FYI only for provider  Patient was last seen in primary care on 08/09/23. Called Nurse Triage reporting Dysuria. Symptoms began a week ago. Interventions attempted: Nothing. Symptoms are: unchanged           Triage Disposition: See Physician Within 24 Hours  Patient/caregiver understands and will follow disposition?: Copied from CRM [id number]. Topic: Clinical - Red Word Triage >> Nov 16, 2023  9:24 AM Ethelle Herb L wrote: Red Word that prompted transfer to Nurse Triage: burning sensation while urinating, having to use the bathroom frequently Reason for Disposition  All other males with painful urination  Answer Assessment - Initial Assessment Questions 1. SEVERITY: "How bad is the pain?"  (e.g., Scale 1-10; mild, moderate, or severe)   - MILD (1-3): Complains slightly about urination hurting.   - MODERATE (4-7): Interferes with normal activities.     - SEVERE (8-10): Excruciating, unwilling or unable to urinate because of the pain.      6 3. PATTERN: "Is pain present every time you urinate or just sometimes?"      Every time  4. ONSET: "When did the painful urination start?"      5 days 5. FEVER: "Do you have a fever?" If Yes, ask: "What is your temperature, how was it measured, and when did it start?"     no 7. CAUSE: "What do you think is causing the painful urination?"      UTI 8. OTHER SYMPTOMS: "Do you have any other symptoms?" (e.g., flank pain, penis discharge, scrotal pain, blood in urine)     Frequency. Urgency- wiped blood after BM (small amount) and had diarrhea while on abx  Protocols used: Urination Pain - Male-A-AH

## 2023-11-17 NOTE — Progress Notes (Signed)
 Patient did give a urine sample but unfortunately did not stay for lab draw or complete video visit.  Ordered urine cytology as courtesy

## 2023-11-18 LAB — URINE CYTOLOGY ANCILLARY ONLY
Chlamydia: NEGATIVE
Comment: NEGATIVE
Comment: NEGATIVE
Comment: NORMAL
Neisseria Gonorrhea: NEGATIVE
Trichomonas: NEGATIVE

## 2023-11-21 ENCOUNTER — Ambulatory Visit: Payer: Self-pay | Admitting: Physician Assistant

## 2023-11-23 ENCOUNTER — Other Ambulatory Visit: Payer: Self-pay | Admitting: *Deleted

## 2023-11-23 ENCOUNTER — Ambulatory Visit: Attending: Physician Assistant | Admitting: Physician Assistant

## 2023-11-23 ENCOUNTER — Ambulatory Visit: Admitting: *Deleted

## 2023-11-23 ENCOUNTER — Other Ambulatory Visit

## 2023-11-23 ENCOUNTER — Encounter: Payer: Self-pay | Admitting: Physician Assistant

## 2023-11-23 VITALS — BP 131/82 | HR 66 | Resp 16 | Ht 69.0 in | Wt 225.5 lb

## 2023-11-23 DIAGNOSIS — I2699 Other pulmonary embolism without acute cor pulmonale: Secondary | ICD-10-CM

## 2023-11-23 DIAGNOSIS — I2510 Atherosclerotic heart disease of native coronary artery without angina pectoris: Secondary | ICD-10-CM

## 2023-11-23 DIAGNOSIS — E785 Hyperlipidemia, unspecified: Secondary | ICD-10-CM

## 2023-11-23 DIAGNOSIS — I824Y1 Acute embolism and thrombosis of unspecified deep veins of right proximal lower extremity: Secondary | ICD-10-CM | POA: Diagnosis not present

## 2023-11-23 DIAGNOSIS — Z01818 Encounter for other preprocedural examination: Secondary | ICD-10-CM

## 2023-11-23 DIAGNOSIS — Z5181 Encounter for therapeutic drug level monitoring: Secondary | ICD-10-CM

## 2023-11-23 DIAGNOSIS — I10 Essential (primary) hypertension: Secondary | ICD-10-CM | POA: Diagnosis not present

## 2023-11-23 LAB — POCT INR: INR: 2.7 (ref 2.0–3.0)

## 2023-11-23 MED ORDER — WARFARIN SODIUM 5 MG PO TABS: ORAL_TABLET | ORAL | 0 refills | Status: DC

## 2023-11-23 NOTE — Telephone Encounter (Signed)
 Warfarin 5mg  refill DVT lower extremity, Pulmonary embolism  Last INR 11/23/23 Last OV 11/23/23 clearance appt

## 2023-11-23 NOTE — Patient Instructions (Addendum)
 Medication Instructions:  NO CHANGES *If you need a refill on your cardiac medications before your next appointment, please call your pharmacy*  Lab Work: NO LABS If you have labs (blood work) drawn today and your tests are completely normal, you will receive your results only by: MyChart Message (if you have MyChart) OR A paper copy in the mail If you have any lab test that is abnormal or we need to change your treatment, we will call you to review the results.  Testing/Procedures: NO TESTING  Follow-Up: At [facility], you and your health needs are our priority.  As part of our continuing mission to provide you with exceptional heart care, our providers are all part of one team.  This team includes your primary Cardiologist (physician) and Advanced Practice Providers or APPs (Physician Assistants and Nurse Practitioners) who all work together to provide you with the care you need, when you need it.  Your next appointment:   1 year(s)  Provider:   Antoinette Batman, MD   Other Instructions Hold Aspirin  for 5-7 days prior to surgery Hold Coumadin  (Warfarin) for 5 days prior to surgery We recommend you restart both as soon as possible after the procedure at the surgeon's discretion based on the bleeding risk.

## 2023-11-23 NOTE — Telephone Encounter (Signed)
 See office note from today, patient has been cleared for the upcoming procedure.

## 2023-11-23 NOTE — Progress Notes (Signed)
 Cardiology Office Note   Date:  11/23/2023  ID:  Michael [name], DOB [birth year], MRN [id number] PCP: Lawrance Presume, MD   HeartCare Providers Cardiologist:  Antoinette Batman, MD     History of Present Illness Michael [name] is a 63 y.o. male with past medical history of CAD, DVT/PE, PAD, hyperlipidemia and hypertension.  He had anterior STEMI with occluded LAD in October 2012 treated with DES to proximal LAD.  He had diffuse distal disease in the right PLA.  Third OM was occluded and filled with left to left collaterals.  Proximal LAD was treated with 2.75 x 24 mm Promus element DES.  He had a severe contrast dye reaction.  LV function was preserved by echocardiogram.  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  He also had acute right leg DVT and bilateral PE at the time.  Heart monitor in January 2021 showed rare PVCs, rare PACs, 2 short run of SVT.  He was last seen by Lovette Rud in July 2024 at which time he was doing well and was playing pickle ball.  Patient presents today for follow-up.  He has upcoming bladder surgery by Dr. Leila Punt on 12/19/2023.  He has been doing well from the cardiac perspective and can play multiple matches of pickleball in a single day.  He can clearly accomplish more than 4 METS of activity.  He has no significant ST-T wave changes on the EKG.  He denies any shortness of breath compared to his baseline.  He is doing very well from the cardiac perspective and can proceed with upcoming surgery without further workup.  If needed, he can hold the aspirin  for 5 to 7 days prior to the procedure and Coumadin  for 5 days prior to the procedure.  He should restart both medication as soon as possible afterward at the surgeon's  discretion.  He can follow-up with Dr. Abel Hoe in 1 year.  ROS:   He denies chest pain, palpitations, dyspnea, pnd, orthopnea, n, v, dizziness, syncope, edema, weight gain, or early satiety. All other systems reviewed and are otherwise negative except as noted above.    Studies Reviewed      Cardiac Studies & Procedures   ______________________________________________________________________________________________     ECHOCARDIOGRAM  ECHOCARDIOGRAM COMPLETE 11/10/2021  Narrative ECHOCARDIOGRAM REPORT    Patient Name:   Michael [name] Date of Exam: 11/10/2021 Medical Rec #:  [id number]           Height:       69.0 in Accession #:    [id number]          Weight:       244.2 lb Date of Birth:  16-Oct-1960            BSA:          2.249 m Patient Age:    60 years            BP:           160/90 mmHg Patient Gender: M                   HR:           67 bpm. Exam Location:  [address]  Procedure: 2D Echo, Cardiac Doppler, Color Doppler and 3D Echo  Indications:    I25.10 CAD  History:        Patient has no prior history of Echocardiogram examinations. CAD, Arrythmias:SVT, PVC and PAC; Risk Factors:Hypertension, Dyslipidemia and Alcohol abuse.  Sonographer:    Lula Sale RDCS Referring Phys: [id number] CHRISTOPHER D MCALHANY  IMPRESSIONS   1. Global hypokinesis with mid-apical anterolateral and apical anterior akinesis. Left ventricular ejection fraction, by estimation, is 45 to 50%. The left ventricle has mildly decreased function. The left ventricle demonstrates global hypokinesis. The left ventricular internal cavity size was mildly dilated. There is mild concentric left ventricular hypertrophy. Left ventricular diastolic parameters are consistent with Grade II diastolic dysfunction (pseudonormalization). 2. Right ventricular systolic function is normal. The right ventricular size is normal. 3. Left atrial size was severely dilated. 4. The mitral valve is normal in  structure. Mild mitral valve regurgitation. No evidence of mitral stenosis. 5. The aortic valve is tricuspid. Aortic valve regurgitation is not visualized. No aortic stenosis is present. 6. Aortic dilatation noted. There is mild dilatation of the aortic root and of the ascending aorta, measuring 36 mm. 7. The inferior vena cava is normal in size with greater than 50% respiratory variability, suggesting right atrial pressure of 3 mmHg.  FINDINGS Left Ventricle: Global hypokinesis with mid-apical anterolateral and apical anterior akinesis. Left ventricular ejection fraction, by estimation, is 45 to 50%. The left ventricle has mildly decreased function. The left ventricle demonstrates global hypokinesis. The left ventricular internal cavity size was mildly dilated. There is mild concentric left ventricular hypertrophy. Left ventricular diastolic parameters are consistent with Grade II diastolic dysfunction (pseudonormalization). Indeterminate filling pressures.  Right Ventricle: The right ventricular size is normal. No increase in right ventricular wall thickness. Right ventricular systolic function is normal.  Left Atrium: Left atrial size was severely dilated.  Right Atrium: Right atrial size was normal in size.  Pericardium: There is no evidence of pericardial effusion.  Mitral Valve: The mitral valve is normal in structure. Mild mitral valve regurgitation. No evidence of mitral valve stenosis.  Tricuspid Valve: The tricuspid valve is normal in structure. Tricuspid valve regurgitation is not demonstrated. No evidence of tricuspid stenosis.  Aortic Valve: The aortic valve is tricuspid. Aortic valve regurgitation is not visualized. No aortic stenosis is present.  Pulmonic Valve: The pulmonic valve was normal in structure. Pulmonic valve regurgitation is not visualized. No evidence of pulmonic stenosis.  Aorta: Aortic dilatation noted. There is mild dilatation of the aortic root and of the  ascending aorta, measuring 36 mm.  Venous: The inferior vena cava is normal in size with greater than 50% respiratory variability, suggesting right atrial pressure of 3 mmHg.  IAS/Shunts: No atrial level shunt detected by color flow Doppler.   LEFT VENTRICLE PLAX 2D LVIDd:         6.40 cm   Diastology LVIDs:         4.90 cm   LV e' medial:    7.40 cm/s LV PW:         1.10 cm   LV E/e' medial:  13.5 LV IVS:        1.10 cm   LV e' lateral:   10.00 cm/s LVOT diam:     2.00 cm   LV E/e' lateral: 10.0 LV SV:         69 LV SV Index:   31 LVOT Area:     3.14 cm  3D Volume EF: 3D EF:        45 % LV EDV:  164 ml LV ESV:       90 ml LV SV:        75 ml  RIGHT VENTRICLE            IVC RV S prime:     8.92 cm/s  IVC diam: 1.10 cm TAPSE (M-mode): 1.4 cm  LEFT ATRIUM              Index        RIGHT ATRIUM           Index LA diam:        5.50 cm  2.45 cm/m   RA Pressure: 3.00 mmHg LA Vol (A2C):   104.0 ml 46.24 ml/m  RA Area:     19.60 cm LA Vol (A4C):   130.0 ml 57.80 ml/m  RA Volume:   51.80 ml  23.03 ml/m LA Biplane Vol: 115.0 ml 51.13 ml/m AORTIC VALVE LVOT Vmax:   104.00 cm/s LVOT Vmean:  71.500 cm/s LVOT VTI:    0.220 m  AORTA Ao Root diam: 3.60 cm Ao Asc diam:  3.60 cm  MITRAL VALVE                TRICUSPID VALVE MV Area (PHT): 4.26 cm     Estimated RAP:  3.00 mmHg MV Decel Time: 178 msec MV E velocity: 100.00 cm/s  SHUNTS MV A velocity: 57.60 cm/s   Systemic VTI:  0.22 m MV E/A ratio:  1.74         Systemic Diam: 2.00 cm  Maudine Sos MD Electronically signed by Maudine Sos MD Signature Date/Time: 11/10/2021/12:19:13 PM    Final    MONITORS  LONG TERM MONITOR (3-14 DAYS) 07/12/2019  Narrative Sinus rhythm with sinus bradycardia Rare premature ventricular contractions (PVCs) Rare premature atrial contractions with two 4-5 beat runs of supraventricular tachycardia.        ______________________________________________________________________________________________      Risk Assessment/Calculations           Physical Exam VS:  BP 131/82 (BP Location: Right Arm, Patient Position: Sitting, Cuff Size: Large)   Pulse 66   Resp 16   Ht 5' 9 (1.753 m)   Wt 225 lb 8 oz (102.3 kg)   SpO2 97%   BMI 33.30 kg/m    Wt Readings from Last 3 Encounters:  11/23/23 225 lb 8 oz (102.3 kg)  08/09/23 223 lb (101.2 kg)  05/29/23 218 lb (98.9 kg)    GEN: Well nourished, well developed in no acute distress NECK: No JVD; No carotid bruits CARDIAC: RRR, no murmurs, rubs, gallops RESPIRATORY:  Clear to auscultation without rales, wheezing or rhonchi  ABDOMEN: Soft, non-tender, non-distended EXTREMITIES:  No edema; No deformity   ASSESSMENT AND PLAN  Preoperative clearance: Upcoming urology procedure.  Patient can achieve more than 4 metabolic level activity without any issue.  He denies any recent chest pain or shortness of breath.  He is at acceptable risk to proceed with upcoming urology procedure without further workup.  If needed, he can hold aspirin  for 5 to 7 days prior to the procedure and restart as soon as possible afterward.  He can also hold Coumadin  for 5 days prior to the procedure and resume as early as possible after the surgery at the discretion of the surgeon based on bleeding risk.  CAD: Denies any chest pain.  History of DVT/PE: On Coumadin   Hypertension: Blood pressure stable  Hyperlipidemia: On rosuvastatin         Dispo:  Follow-up with Dr. Abel Hoe in 1 year  Signed, Revin Corker, [place]

## 2023-11-23 NOTE — Patient Instructions (Addendum)
 Description   Ccontinue taking Warfarin 1 tablet daily except for 2 tablets on Mondays, Wednesdays and Fridays. Recheck INR in 1 week week post procedure. Keep dark green leafy veggies consistent in your diet at least 2-3 times in your diet (collards, mixed greens, cabbage, broccoli, or spinach). Coumadin  Clinic [phone].       Last dose 12/13/23 and then start holding warfarin for upcoming procedure. After procedure resume normal dose of warfarin and report to appointment.

## 2023-11-25 ENCOUNTER — Ambulatory Visit
Admission: RE | Admit: 2023-11-25 | Discharge: 2023-11-25 | Disposition: A | Discharge status: Home / Self Care | Source: Ambulatory Visit | Attending: Urology

## 2023-11-25 DIAGNOSIS — D4102 Neoplasm of uncertain behavior of left kidney: Secondary | ICD-10-CM

## 2023-11-25 DIAGNOSIS — N281 Cyst of kidney, acquired: Secondary | ICD-10-CM | POA: Diagnosis not present

## 2023-11-25 MED ORDER — GADOPICLENOL 0.5 MMOL/ML IV SOLN
10.0000 mL | Freq: Once | INTRAVENOUS | Status: AC | PRN
  Administered 2023-11-25: 10 mL via INTRAVENOUS

## 2023-12-02 ENCOUNTER — Ambulatory Visit (HOSPITAL_COMMUNITY)
Admission: RE | Admit: 2023-12-02 | Discharge: 2023-12-02 | Disposition: A | Discharge status: Home / Self Care | Source: Ambulatory Visit | Attending: Vascular Surgery | Admitting: Vascular Surgery

## 2023-12-02 DIAGNOSIS — I714 Abdominal aortic aneurysm, without rupture, unspecified: Secondary | ICD-10-CM

## 2023-12-05 NOTE — Patient Instructions (Signed)
 SURGICAL WAITING ROOM VISITATION  Patients having surgery or a procedure may have no more than 2 support people in the waiting area - these visitors may rotate.    Children under the age of 72 must have an adult with them who is not the patient.  Visitors with respiratory illnesses are discouraged from visiting and should remain at home.  If the patient needs to stay at the hospital during part of their recovery, the visitor guidelines for inpatient rooms apply. Pre-op nurse will coordinate an appropriate time for 1 support person to accompany patient in pre-op.  This support person may not rotate.    Please refer to the [facility] website for the visitor guidelines for Inpatients (after your surgery is over and you are in a regular room).       Your procedure is scheduled on: 12/19/23   Report to [facility] Main Entrance    Report to admitting at : 5:15 AM   Call this number if you have problems the morning of surgery [phone]   Do not eat food or drink: After Midnight.  FOLLOW BOWEL PREP AND ANY ADDITIONAL PRE OP INSTRUCTIONS YOU RECEIVED FROM YOUR SURGEON'S OFFICE!!!   Oral Hygiene is also important to reduce your risk of infection.                                    Remember - BRUSH YOUR TEETH THE MORNING OF SURGERY WITH YOUR REGULAR TOOTHPASTE  DENTURES WILL BE REMOVED PRIOR TO SURGERY PLEASE DO NOT APPLY Poly grip OR ADHESIVES!!!   Do NOT smoke after Midnight   Stop all vitamins and herbal supplements 7 days before surgery.   Take these medicines the morning of surgery with A SIP OF WATER : metoprolol .Use eye drops as usual.                            HOLD Coumadin  and aspirin  after: 12/13/23    You may not have any metal on your body including hair pins, jewelry, and body piercing             Do not wear lotions, powders, perfumes/cologne, or deodorant              Men may shave face and neck.   Do not bring valuables to the hospital. [facility] IS  NOT             RESPONSIBLE   FOR VALUABLES.   Contacts, glasses, dentures or bridgework may not be worn into surgery.   Bring small overnight bag day of surgery.   DO NOT BRING YOUR HOME MEDICATIONS TO THE HOSPITAL. PHARMACY WILL DISPENSE MEDICATIONS LISTED ON YOUR MEDICATION LIST TO YOU DURING YOUR ADMISSION IN THE HOSPITAL!    Patients discharged on the day of surgery will not be allowed to drive home.  Someone NEEDS to stay with you for the first 24 hours after anesthesia.   Special Instructions: Bring a copy of your healthcare power of attorney and living will documents the day of surgery if you haven't scanned them before.              Please read over the following fact sheets you were given: IF YOU HAVE QUESTIONS ABOUT YOUR PRE-OP INSTRUCTIONS PLEASE CALL [phone]   If you received a COVID test during your pre-op visit  it is requested  that you wear a mask when out in public, stay away from anyone that may not be feeling well and notify your surgeon if you develop symptoms. If you test positive for Covid or have been in contact with anyone that has tested positive in the last 10 days please notify you surgeon.    [facility] - Preparing for Surgery Before surgery, you can play an important role.  Because skin is not sterile, your skin needs to be as free of germs as possible.  You can reduce the number of germs on your skin by washing with CHG (chlorahexidine gluconate) soap before surgery.  CHG is an antiseptic cleaner which kills germs and bonds with the skin to continue killing germs even after washing. Please DO NOT use if you have an allergy to CHG or antibacterial soaps.  If your skin becomes reddened/irritated stop using the CHG and inform your nurse when you arrive at Short Stay. Do not shave (including legs and underarms) for at least 48 hours prior to the first CHG shower.  You may shave your face/neck. Please follow these instructions carefully:  1.  Shower with CHG  Soap the night before surgery and the  morning of Surgery.  2.  If you choose to wash your hair, wash your hair first as usual with your  normal  shampoo.  3.  After you shampoo, rinse your hair and body thoroughly to remove the  shampoo.                           4.  Use CHG as you would any other liquid soap.  You can apply chg directly  to the skin and wash                       Gently with a scrungie or clean washcloth.  5.  Apply the CHG Soap to your body ONLY FROM THE NECK DOWN.   Do not use on face/ open                           Wound or open sores. Avoid contact with eyes, ears mouth and genitals (private parts).                       Wash face,  Genitals (private parts) with your normal soap.             6.  Wash thoroughly, paying special attention to the area where your surgery  will be performed.  7.  Thoroughly rinse your body with warm water  from the neck down.  8.  DO NOT shower/wash with your normal soap after using and rinsing off  the CHG Soap.                9.  Pat yourself dry with a clean towel.            10.  Wear clean pajamas.            11.  Place clean sheets on your bed the night of your first shower and do not  sleep with pets. Day of Surgery : Do not apply any lotions/deodorants the morning of surgery.  Please wear clean clothes to the hospital/surgery center.  FAILURE TO FOLLOW THESE INSTRUCTIONS MAY RESULT IN THE CANCELLATION OF YOUR SURGERY PATIENT SIGNATURE_________________________________  NURSE SIGNATURE__________________________________  ________________________________________________________________________

## 2023-12-06 ENCOUNTER — Other Ambulatory Visit: Payer: Self-pay

## 2023-12-06 ENCOUNTER — Encounter (HOSPITAL_COMMUNITY): Payer: Self-pay

## 2023-12-06 ENCOUNTER — Encounter (HOSPITAL_COMMUNITY)
Admission: RE | Admit: 2023-12-06 | Discharge: 2023-12-06 | Disposition: A | Discharge status: Home / Self Care | Source: Ambulatory Visit | Attending: Urology | Admitting: Urology

## 2023-12-06 VITALS — BP 143/85 | HR 62 | Temp 98.6°F | Ht 69.0 in | Wt 226.0 lb

## 2023-12-06 DIAGNOSIS — Z01812 Encounter for preprocedural laboratory examination: Secondary | ICD-10-CM | POA: Diagnosis not present

## 2023-12-06 DIAGNOSIS — Z7901 Long term (current) use of anticoagulants: Secondary | ICD-10-CM

## 2023-12-06 DIAGNOSIS — I10 Essential (primary) hypertension: Secondary | ICD-10-CM | POA: Insufficient documentation

## 2023-12-06 HISTORY — DX: Acute embolism and thrombosis of unspecified deep veins of unspecified lower extremity: I82.409

## 2023-12-06 LAB — CBC
HCT: 45.5 % (ref 39.0–52.0)
Hemoglobin: 14 g/dL (ref 13.0–17.0)
MCH: 26.6 pg (ref 26.0–34.0)
MCHC: 30.8 g/dL (ref 30.0–36.0)
MCV: 86.5 fL (ref 80.0–100.0)
Platelets: 188 10*3/uL (ref 150–400)
RBC: 5.26 MIL/uL (ref 4.22–5.81)
RDW: 13.6 % (ref 11.5–15.5)
WBC: 5.9 10*3/uL (ref 4.0–10.5)
nRBC: 0 % (ref 0.0–0.2)

## 2023-12-06 LAB — COMPREHENSIVE METABOLIC PANEL WITH GFR
ALT: 39 U/L (ref 0–44)
AST: 26 U/L (ref 15–41)
Albumin: 3.8 g/dL (ref 3.5–5.0)
Alkaline Phosphatase: 70 U/L (ref 38–126)
Anion gap: 9 (ref 5–15)
BUN: 23 mg/dL (ref 8–23)
CO2: 24 mmol/L (ref 22–32)
Calcium: 9.1 mg/dL (ref 8.9–10.3)
Chloride: 103 mmol/L (ref 98–111)
Creatinine, Ser: 0.98 mg/dL (ref 0.61–1.24)
GFR, Estimated: >60 mL/min (ref >60)
Glucose, Bld: 96 mg/dL (ref 70–99)
Potassium: 4 mmol/L (ref 3.5–5.1)
Sodium: 136 mmol/L (ref 135–145)
Total Bilirubin: 0.6 mg/dL (ref 0.0–1.2)
Total Protein: 7.1 g/dL (ref 6.5–8.1)

## 2023-12-06 NOTE — Progress Notes (Signed)
 For Anesthesia: PCP - Vicci Barnie NOVAK, MD  Cardiologist - Verlin Lonni BIRCH, MD  Clearance: Janene Boer: PA: 11/22/21 Bowel Prep reminder:N/A  Chest x-ray - 03/20/23 EKG - 11/23/23 Stress Test -  ECHO - 11/10/21 Cardiac Cath -  Pacemaker/ICD device last checked: Pacemaker orders received: Device Rep notified:  Spinal Cord Stimulator:N/A  Sleep Study - N/A CPAP -   Fasting Blood Sugar - N/A Checks Blood Sugar ___0__ times a day Date and result of last Hgb A1c-6.3: 08/09/23  Last dose of GLP1 agonist- N/A GLP1 instructions:   Last dose of SGLT-2 inhibitors- N/A SGLT-2 instructions:   Blood Thinner Instructions: Coumadin  and aspirin  will be hold after: 12/13/23 Aspirin  Instructions: Last Dose:  Activity level: Can go up a flight of stairs and activities of daily living without stopping and without chest pain and/or shortness of breath   Able to exercise without chest pain and/or shortness of breath  Anesthesia review: Hx: HTN,MI,CAD,DVT,PAD,PSVT,PVC's.  Patient denies shortness of breath, fever, cough and chest pain at PAT appointment   Patient verbalized understanding of instructions that were reviewed over the telephone.

## 2023-12-09 ENCOUNTER — Encounter (HOSPITAL_COMMUNITY): Payer: Self-pay

## 2023-12-09 NOTE — Progress Notes (Signed)
 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 PMH of HTN, hx of STEMI, CAD s/p PCI to LAD (2012), CHF EF 45-50%, PSVT, PVD s/p fem-pop bypass (2019), hx of DVT/PE on Warfarin, hx of CVA (chronic infarcts by MRI brain in 2020), hx of ETOH abuse in remission, bladder tumor  Patient follows with Cardiology for hx of STEMI and CAD s/p PCI to LAD in 2012 and systolic CHF. Also has hx of DVT and PE and is on Coumadin . He was last seen in clinic on 6/11 for pre op clearance. Noted to be doing well. Can do >4 METS and plays pickleball regularly. Cleared for surgery:  Preoperative clearance: Upcoming urology procedure.  Patient can achieve more than 4 metabolic level activity without any issue.  He denies any recent chest pain or shortness of breath.  He is at acceptable risk to proceed with upcoming urology procedure without further workup.  If needed, he can hold aspirin  for 5 to 7 days prior to the procedure and restart as soon as possible afterward.  He can also hold Coumadin  for 5 days prior to the procedure and resume as early as possible after the surgery at the discretion of the surgeon based on bleeding risk.  Patient follows with Vascular for hx of PAD. He had a right popliteal and tibial embolectomy in August 2019.  He also underwent left femoral to below the knee popliteal bypass with PTFE at the same time. Last seen in clinic on 04/21/23 for surveillance. Bypass is known to be occluded but has no symptoms and was advised to continue current  management and repeat ABIs in a year.  LD Warfarin: 7/1  VS: BP (!) 143/85   Pulse 62   Temp 37 C (Oral)   Ht 5' 9 (1.753 m)   Wt 102.5 kg   SpO2 99%   BMI 33.37 kg/m   PROVIDERS: Vicci Barnie NOVAK, MD   LABS: Labs reviewed: Acceptable for surgery. INR for DOS (all labs ordered are listed, but only abnormal results are displayed)  Labs Reviewed  COMPREHENSIVE METABOLIC PANEL WITH GFR  CBC     IMAGES:   EKG 11/23/23:  Sinus rhythm with 1st degree A-V block with Premature atrial complexes with Abberant conduction, rate 66 Left axis deviation Incomplete right bundle branch block Cannot rule out Anterior infarct , age undetermined When compared with ECG of 20-Mar-2023 18:10, Fusion complexes are no longer Present Premature ventricular complexes are no longer Present Abberant conduction is now Present PR interval has increased Incomplete right bundle branch block is now Present  CV:  Echo 11/10/21:  IMPRESSIONS    1. Global hypokinesis with mid-apical anterolateral and apical anterior akinesis. Left ventricular ejection fraction, by estimation, is 45 to 50%. The left ventricle has mildly decreased function. The left ventricle demonstrates global hypokinesis. The left ventricular internal cavity size was mildly dilated. There is mild concentric left ventricular hypertrophy. Left ventricular diastolic parameters are consistent with Grade II diastolic dysfunction (pseudonormalization).  2. Right ventricular  systolic function is normal. The right ventricular size is normal.  3. Left atrial size was severely dilated.  4. The mitral valve is normal in structure. Mild mitral valve regurgitation. No evidence of mitral stenosis.  5. The aortic valve is tricuspid. Aortic valve regurgitation is not visualized. No aortic stenosis is present.  6. Aortic dilatation noted. There is mild dilatation of the aortic root and of the ascending aorta, measuring 36 mm.  7. The  inferior vena cava is normal in size with greater than 50% respiratory variability, suggesting right atrial pressure of 3 mmHg.  Past Medical History:  Diagnosis Date   Alcohol abuse    Anterior myocardial infarction [facility])    ST-elevation; S/P emergent  drug-eluting stenting of proximal left anterior descending   Coronary artery disease    a. STEMI 2012 - s/p DES to LAD with  diffuse distal disease in the right posterolateral branch. The third OM was occluded and filled from left to left collaterals. LVEF preserved.   DVT complicating pregnancy    Dysuria 04/26/2013   Essential hypertension 05/16/2014   Frequent urination 09/03/2014   HNP (herniated nucleus pulposus), cervical 10/14/2014   Hyperlipidemia    Hypertension    Ischemia of extremity 01/23/2018   Lower abdominal pain 09/03/2014   PAD (peripheral artery disease) (HCC)    Pre-diabetes    Premature atrial contraction    PSVT (paroxysmal supraventricular tachycardia) (HCC)    2 short runs on monitor 06/2019   Pulmonary emboli (HCC)    PVC's (premature ventricular contractions)    Renal insufficiency    Special screening for malignant neoplasms, colon     Past Surgical History:  Procedure Laterality Date   COLONOSCOPY N/A 08/16/2014   Procedure: COLONOSCOPY;  Surgeon: Margo LITTIE Haddock, MD;  [facility];  Service: Endoscopy;  Laterality: N/A;  10:15 AM   DOPPLER ECHOCARDIOGRAPHY     Preserved left venticular function   EMBOLECTOMY Right 01/23/2018   Procedure: RIGHT POPLITEAL-TIBIAL  ARTERY EMBOLECTOMY, EXPLORATION OF RIGHT ANTERIOR TIBIAL ARTERY, RIGHT POPLITEAL ARTERY VEIN ANGIOPLASTY;  Surgeon: Haddock Carlin BRAVO, MD;  Location: [facility];  Service: Vascular;  Laterality: Right;   ENDARTERECTOMY POPLITEAL Left 01/23/2018   Procedure: ENDARTERECTOMY OF LEFT POPLITEAL ARTERY AND TIBIAL-PERONEAL TRUNK;  Surgeon: Haddock Carlin BRAVO, MD;  Location: [facility] OR;  Service: Vascular;  Laterality: Left;   FEMORAL-POPLITEAL BYPASS GRAFT Left  01/23/2018   Procedure: BYPASS GRAFT LEFT FEMORAL-POPLITEAL ARTERY WITH PROPATEN VASCULAR GRAFT;  Surgeon: Haddock Carlin BRAVO, MD;  Location: [facility] OR;  Service: Vascular;  Laterality: Left;   HEMATOMA EVACUATION Left 01/25/2018   Procedure: EVACUATION HEMATOMA LEFT POPLITEAL SPACE;  Surgeon: Gretta Lonni PARAS, MD;  Location[facility];  Service: Vascular;  Laterality: Left;   LEG SURGERY     left leg - has rod and 4 pins   PATELLAR TENDON REPAIR Right 04/26/2014   Procedure: RIGHT PATELLA TENDON REPAIR;  Surgeon: Evalene JONETTA Chancy, MD;  Location: [facility];  Service: Orthopedics;  Laterality: Right;    MEDICATIONS:  aspirin  EC 81 MG tablet   diclofenac  Sodium (VOLTAREN ) 1 % GEL   ezetimibe  (ZETIA ) 10 MG tablet   gabapentin  (NEURONTIN ) 300 MG capsule   latanoprost (XALATAN) 0.005 % ophthalmic solution   lisinopril  (ZESTRIL ) 40 MG tablet   metoprolol  tartrate (LOPRESSOR ) 25 MG tablet   Multiple Vitamin (MULTIVITAMIN WITH MINERALS) TABS tablet   nitroGLYCERIN  (NITROSTAT ) 0.4 MG SL tablet   ofloxacin  (OCUFLOX ) 0.3 % ophthalmic solution  rosuvastatin  (CRESTOR ) 40 MG tablet   tamsulosin  (FLOMAX ) 0.4 MG CAPS capsule   warfarin (COUMADIN ) 5 MG tablet   No current facility-administered medications for this encounter.   Burnard CHRISTELLA Odis DEVONNA [facility] Short Stay/Anesthesiology [facility] Phone [phone] 12/09/2023 3:28 PM

## 2023-12-09 NOTE — Anesthesia Preprocedure Evaluation (Addendum)
 Anesthesia Evaluation  Patient identified by MRN, date of birth, ID band Patient awake    Reviewed: Allergy & Precautions, NPO status , Patient's Chart, lab work & pertinent test results  Airway Mallampati: IV  TM Distance: >3 FB Neck ROM: Full    Dental  (+) Dental Advisory Given   Pulmonary neg pulmonary ROS   breath sounds clear to auscultation       Cardiovascular hypertension, Pt. on medications and Pt. on home beta blockers + CAD, + Past MI, + Cardiac Stents, + Peripheral Vascular Disease and + DVT   Rhythm:Regular Rate:Normal     Neuro/Psych negative neurological ROS     GI/Hepatic negative GI ROS, Neg liver ROS,,,  Endo/Other  negative endocrine ROS    Renal/GU CRFRenal disease     Musculoskeletal   Abdominal   Peds  Hematology  (+) Blood dyscrasia (Last dose coumadin  1 week ago. INR 1.0)   Anesthesia Other Findings   Reproductive/Obstetrics                              Anesthesia Physical Anesthesia Plan  ASA: 3  Anesthesia Plan: General   Post-op Pain Management: Tylenol  PO (pre-op)*   Induction: Intravenous  PONV Risk Score and Plan: 2 and Dexamethasone , Ondansetron  and Treatment may vary due to age or medical condition  Airway Management Planned: Oral ETT and LMA  Additional Equipment:   Intra-op Plan:   Post-operative Plan: Extubation in OR  Informed Consent: I have reviewed the patients History and Physical, chart, labs and discussed the procedure including the risks, benefits and alternatives for the proposed anesthesia with the patient or authorized representative who has indicated his/her understanding and acceptance.     Dental advisory given  Plan Discussed with: CRNA  Anesthesia Plan Comments:          Anesthesia Quick Evaluation

## 2023-12-13 ENCOUNTER — Ambulatory Visit: Attending: Vascular Surgery | Admitting: Vascular Surgery

## 2023-12-13 ENCOUNTER — Encounter: Payer: Self-pay | Admitting: Vascular Surgery

## 2023-12-13 VITALS — BP 141/81 | HR 64 | Temp 98.1°F | Resp 18 | Ht 69.0 in | Wt 231.5 lb

## 2023-12-13 DIAGNOSIS — I723 Aneurysm of iliac artery: Secondary | ICD-10-CM | POA: Diagnosis not present

## 2023-12-13 NOTE — Progress Notes (Signed)
 [facility] [place]  ASSESSMENT / PLAN: 63 y.o. male with 27mm right common iliac artery aneurysm. History of peripheral arterial disease s/p bilateral lower extremity bypass.   Recommend:  Abstinence from all tobacco products. Blood glucose control with goal A1c < 7%. Blood pressure control with goal blood pressure < 130/80 mmHg. Lipid reduction therapy with goal LDL-C < 55 mg/dL. Aspirin  81mg  by mouth daily. Atorvastatin 40-80mg  PO QD (or other high intensity statin therapy).  Counseled about low rupture risk from right common iliac artery aneurysm.  Will consolidate follow-up in January 2026 with me to include AAA duplex and ankle-brachial index.  CHIEF COMPLAINT: Follow-up CT scan  HISTORY OF PRESENT ILLNESS: Michael [name] is a 63 y.o. male referred to clinic for evaluation of 27 mm right common iliac artery aneurysm identified on CT scan during evaluation in the hospital in October 2024.  Patient is asymptomatic from a aneurysm standpoint.  He is scheduled to undergo TURBT with bladder chemotherapy with Dr. Carolee on 12/19/2023.  He has a remote history of lower extremity bypass for acute limb ischemia.  Past Medical History:  Diagnosis Date   Alcohol abuse    Anterior myocardial infarction [facility])    ST-elevation; S/P emergent  drug-eluting stenting of proximal left anterior descending   Coronary artery disease    a. STEMI 2012 - s/p DES to LAD with  diffuse distal disease in the right posterolateral branch. The third OM was occluded and filled from left to left collaterals. LVEF preserved.   DVT (deep venous thrombosis) (HCC)    Dysuria 04/26/2013   Essential hypertension 05/16/2014   Frequent urination 09/03/2014   HNP (herniated nucleus pulposus), cervical 10/14/2014   Hyperlipidemia    Hypertension    Ischemia of extremity 01/23/2018   Lower abdominal pain 09/03/2014   PAD (peripheral artery disease) (HCC)    Pre-diabetes    Premature atrial  contraction    PSVT (paroxysmal supraventricular tachycardia) (HCC)    2 short runs on monitor 06/2019   Pulmonary emboli (HCC)    PVC's (premature ventricular contractions)    Renal insufficiency    Special screening for malignant neoplasms, colon     Past Surgical History:  Procedure Laterality Date   COLONOSCOPY N/A 08/16/2014   Procedure: COLONOSCOPY;  Surgeon: Margo LITTIE Haddock, MD;  [facility];  Service: Endoscopy;  Laterality: N/A;  10:15 AM   DOPPLER ECHOCARDIOGRAPHY     Preserved left venticular function   EMBOLECTOMY Right 01/23/2018   Procedure: RIGHT POPLITEAL-TIBIAL  ARTERY EMBOLECTOMY, EXPLORATION OF RIGHT ANTERIOR TIBIAL ARTERY, RIGHT POPLITEAL ARTERY VEIN ANGIOPLASTY;  Surgeon: Haddock Carlin BRAVO, MD;  [facility];  Service: Vascular;  Laterality: Right;   ENDARTERECTOMY POPLITEAL Left 01/23/2018   Procedure: ENDARTERECTOMY OF LEFT POPLITEAL ARTERY AND TIBIAL-PERONEAL TRUNK;  Surgeon: Haddock Carlin BRAVO, MD;  Location: [facility] OR;  Service: Vascular;  Laterality: Left;   FEMORAL-POPLITEAL BYPASS GRAFT Left 01/23/2018   Procedure: BYPASS GRAFT LEFT FEMORAL-POPLITEAL ARTERY WITH PROPATEN VASCULAR GRAFT;  Surgeon: Haddock Carlin BRAVO, MD;  Location: [facility] OR;  Service: Vascular;  Laterality: Left;   HEMATOMA EVACUATION Left 01/25/2018   Procedure: EVACUATION HEMATOMA LEFT POPLITEAL SPACE;  Surgeon: Gretta Lonni PARAS, MD;  Location[facility];  Service: Vascular;  Laterality: Left;   LEG SURGERY     left leg - has rod and 4 pins   PATELLAR TENDON REPAIR Right 04/26/2014   Procedure: RIGHT PATELLA TENDON REPAIR;  Surgeon: Evalene JONETTA Chancy, MD;  Location: [facility];  Service: Orthopedics;  Laterality: Right;    Family History  Problem Relation Age of Onset   Cancer Maternal Aunt    Hyperlipidemia Other    Diabetes Other    Heart disease Other     Social History   Socioeconomic History   Marital status: Significant Other    Spouse name: Not on file    Number of children: 0   Years of education: Not on file   Highest education level: Not on file  Occupational History   Not on file  Tobacco Use   Smoking status: Never   Smokeless tobacco: Never  Vaping Use   Vaping status: Never Used  Substance and Sexual Activity   Alcohol use: Not Currently    Comment: Drinks occassionally   Drug use: No   Sexual activity: Yes  Other Topics Concern   Not on file  Social History Narrative   Not on file   Social Drivers of Health   Financial Resource Strain: Low Risk  (03/29/2023)   Overall Financial Resource Strain (CARDIA)    Difficulty of Paying Living Expenses: Not hard at all  Food Insecurity: No Food Insecurity (03/29/2023)   Hunger Vital Sign    Worried About Running Out of Food in the Last Year: Never true    Ran Out of Food in the Last Year: Never true  Transportation Needs: No Transportation Needs (03/29/2023)   PRAPARE - Administrator, Civil Service (Medical): No    Lack of Transportation (Non-Medical): No  Physical Activity: Inactive (03/29/2023)   Exercise Vital Sign    Days of Exercise per Week: 0 days    Minutes of Exercise per Session: 0 min  Stress: No Stress Concern Present (03/29/2023)   [employer] of Occupational Health - Occupational Stress Questionnaire    Feeling of Stress : Not at all  Social Connections: Unknown (03/29/2023)   Social Connection and Isolation Panel    Frequency of Communication with Friends and Family: More than three times a week    Frequency of Social Gatherings with Friends and Family: Three times a week    Attends Religious Services: 1 to 4 times per year    Active Member of Clubs or Organizations: No    Attends Banker Meetings: Never    Marital Status: Patient declined  Intimate Partner Violence: Not At Risk (03/29/2023)   Humiliation, Afraid, Rape, and Kick questionnaire    Fear of Current or Ex-Partner: No    Emotionally Abused: No    Physically  Abused: No    Sexually Abused: No    Allergies  Allergen Reactions   Ivp Dye [Iodinated Contrast Media] Swelling    Current Outpatient Medications  Medication Sig Dispense Refill   aspirin  EC 81 MG tablet Take 1 tablet (81 mg total) by mouth daily. 90 tablet 3   diclofenac  Sodium (VOLTAREN ) 1 % GEL Apply 2 g topically 2 (two) times daily as needed. 100 g 1   ezetimibe  (ZETIA ) 10 MG tablet Take 1 tablet by mouth once daily 90 tablet 0   latanoprost (XALATAN) 0.005 % ophthalmic solution Place 1 drop into the left eye at bedtime.     lisinopril  (ZESTRIL ) 40 MG tablet Take 1 tablet (40 mg total) by mouth daily. 90 tablet 0   metoprolol  tartrate (LOPRESSOR ) 25 MG tablet Take 1 tablet (25 mg total) by mouth 2 (two) times daily. 180 tablet 1   Multiple Vitamin (MULTIVITAMIN WITH  MINERALS) TABS tablet Take 1 tablet by mouth daily.     nitroGLYCERIN  (NITROSTAT ) 0.4 MG SL tablet Place 1 tablet (0.4 mg total) under the tongue every 5 (five) minutes as needed for chest pain. 75 tablet 2   ofloxacin  (OCUFLOX ) 0.3 % ophthalmic solution Place 1 drop into the left eye 4 (four) times daily. (Patient taking differently: Place 1 drop into the left eye in the morning and at bedtime.) 5 mL 0   rosuvastatin  (CRESTOR ) 40 MG tablet Take 1 tablet (40 mg total) by mouth daily. 90 tablet 3   tamsulosin  (FLOMAX ) 0.4 MG CAPS capsule Take 1 capsule (0.4 mg total) by mouth at bedtime. 90 capsule 3   warfarin (COUMADIN ) 5 MG tablet TAKE 1 TO 2 TABLETS BY MOUTH ONCE DAILY AS DIRECTED BY  THE  COUMADIN   CLINIC (Patient taking differently: Take 5-10 mg by mouth See admin instructions. Take 10 mg by mouth on Monday, Wednesday and Friday; and take 5 mg on Tuesday, Thursday, Saturday, and Sunday.) 150 tablet 0   gabapentin  (NEURONTIN ) 300 MG capsule TAKE 1 CAPSULE BY MOUTH IN THE MORNING AND 2 IN THE EVENING (Patient not taking: Reported on 12/13/2023) 90 capsule 4   No current facility-administered medications for this visit.     PHYSICAL EXAM Vitals:   12/13/23 0924  BP: (!) 141/81  Pulse: 64  Resp: 18  Temp: 98.1 F (36.7 C)  TempSrc: Temporal  SpO2: 98%  Weight: 231 lb 8 oz (105 kg)  Height: 5' 9 (1.753 m)   Well-appearing man in no distress Regular rate and rhythm Unlabored breathing Abdomen soft and nontender No pedal pulses palpable  PERTINENT LABORATORY AND RADIOLOGIC DATA  Most recent CBC    Latest Ref Rng & Units 12/06/2023   12:08 PM 03/20/2023    6:27 PM 12/27/2022   11:43 AM  CBC  WBC 4.0 - 10.5 K/uL 5.9  8.4  5.3   Hemoglobin 13.0 - 17.0 g/dL 85.9  85.2  85.9   Hematocrit 39.0 - 52.0 % 45.5  47.1  43.4   Platelets 150 - 400 K/uL 188  202  202      Most recent CMP    Latest Ref Rng & Units 12/06/2023   12:08 PM 03/20/2023    6:27 PM 12/27/2022   11:43 AM  CMP  Glucose 70 - 99 mg/dL 96  94  99   BUN 8 - 23 mg/dL 23  15  16    Creatinine 0.61 - 1.24 mg/dL 9.01  8.75  9.08   Sodium 135 - 145 mmol/L 136  136  138   Potassium 3.5 - 5.1 mmol/L 4.0  3.8  4.4   Chloride 98 - 111 mmol/L 103  98  102   CO2 22 - 32 mmol/L 24  24  20    Calcium  8.9 - 10.3 mg/dL 9.1  9.1  9.2   Total Protein 6.5 - 8.1 g/dL 7.1   7.0   Total Bilirubin 0.0 - 1.2 mg/dL 0.6   <9.7   Alkaline Phos 38 - 126 U/L 70   118   AST 15 - 41 U/L 26   22   ALT 0 - 44 U/L 39   32     Renal function Estimated Creatinine Clearance: 93.3 mL/min (by C-G formula based on SCr of 0.98 mg/dL).  HbA1c, POC (prediabetic range) (%)  Date Value  12/27/2022 6.4   Hgb A1c MFr Bld (%)  Date Value  08/09/2023 6.3 (H)  LDL Chol Calc (NIH)  Date Value Ref Range Status  08/09/2023 61 0 - 99 mg/dL Final   Direct LDL  Date Value Ref Range Status  05/04/2013 164.1 mg/dL Final    Comment:    Optimal:  <100 mg/dLNear or Above Optimal:  100-129 mg/dLBorderline High:  130-159 mg/dLHigh:  160-189 mg/dLVery High:  >190 mg/dL     ABDOMINAL AORTA STUDY   Patient Name:  KEEGEN HEFFERN  Date of Exam:   12/02/2023   Medical Rec #: [id number]            Accession #:    [id number]  Date of Birth: 22-Oct-1960             Patient Gender: M  Patient Age:   23 years  Exam Location:  [address]  Procedure:      VAS US  AAA DUPLEX  Referring Phys: JOSHUA ROBINS    ---------------------------------------------------------------------------  -----    Indications: Right common iliac artery aneurysm   Limitations: Air/bowel gas and obesity.     Comparison Study: CT abdomen 03/20/2023 2.7 R CIA   Performing Technologist: Geni Lodge RVS, RCS     Examination Guidelines: A complete evaluation includes B-mode imaging,  spectral  Doppler, color Doppler, and power Doppler as needed of all accessible  portions  of each vessel. Bilateral testing is considered an integral part of a  complete  examination. Limited examinations for reoccurring indications may be  performed  as noted.     Abdominal Aorta Findings:  +-----------+-------+----------+----------+--------+--------+--------+  Location  AP (cm)Trans (cm)PSV (cm/s)WaveformThrombusComments  +-----------+-------+----------+----------+--------+--------+--------+  Proximal  1.95   2.04                                          +-----------+-------+----------+----------+--------+--------+--------+  Mid       1.93   2.01                                          +-----------+-------+----------+----------+--------+--------+--------+  Distal    2.09   2.26                                          +-----------+-------+----------+----------+--------+--------+--------+  RT CIA Prox2.7    2.8       78                                  +-----------+-------+----------+----------+--------+--------+--------+  LT CIA Prox1.6    1.8                                           +-----------+-------+----------+----------+--------+--------+--------+         Summary:  Abdominal Aorta: No evidence of an abdominal  aortic aneurysm was  visualized. There is evidence of abnormal dilation of the Right Common  Iliac artery.      Debby SAILOR. Magda, MD FACS [facility] Phone Number: [phone] 12/13/2023 9:40 AM   Total time spent on preparing this encounter including chart review, data review,  collecting history, examining the patient, and coordinating care: 45 minutes  Portions of this report may have been transcribed using voice recognition software.  Every effort has been made to ensure accuracy; however, inadvertent computerized transcription errors may still be present.

## 2023-12-19 ENCOUNTER — Ambulatory Visit (HOSPITAL_COMMUNITY): Payer: Self-pay | Admitting: Medical

## 2023-12-19 ENCOUNTER — Encounter (HOSPITAL_COMMUNITY)
Admission: RE | Disposition: A | Discharge status: Home / Self Care | Payer: Self-pay | Source: Ambulatory Visit | Attending: Urology

## 2023-12-19 ENCOUNTER — Ambulatory Visit (HOSPITAL_COMMUNITY)
Admission: RE | Admit: 2023-12-19 | Discharge: 2023-12-19 | Disposition: A | Discharge status: Home / Self Care | Source: Ambulatory Visit | Attending: Urology | Admitting: Urology

## 2023-12-19 ENCOUNTER — Ambulatory Visit (HOSPITAL_COMMUNITY)

## 2023-12-19 ENCOUNTER — Encounter (HOSPITAL_COMMUNITY): Payer: Self-pay | Admitting: Urology

## 2023-12-19 ENCOUNTER — Other Ambulatory Visit: Payer: Self-pay

## 2023-12-19 ENCOUNTER — Ambulatory Visit (HOSPITAL_BASED_OUTPATIENT_CLINIC_OR_DEPARTMENT_OTHER): Admitting: Anesthesiology

## 2023-12-19 DIAGNOSIS — D090 Carcinoma in situ of bladder: Secondary | ICD-10-CM | POA: Diagnosis not present

## 2023-12-19 DIAGNOSIS — Z7901 Long term (current) use of anticoagulants: Secondary | ICD-10-CM | POA: Insufficient documentation

## 2023-12-19 DIAGNOSIS — N182 Chronic kidney disease, stage 2 (mild): Secondary | ICD-10-CM | POA: Diagnosis not present

## 2023-12-19 DIAGNOSIS — R3129 Other microscopic hematuria: Secondary | ICD-10-CM | POA: Diagnosis not present

## 2023-12-19 DIAGNOSIS — D494 Neoplasm of unspecified behavior of bladder: Secondary | ICD-10-CM | POA: Insufficient documentation

## 2023-12-19 DIAGNOSIS — I129 Hypertensive chronic kidney disease with stage 1 through stage 4 chronic kidney disease, or unspecified chronic kidney disease: Secondary | ICD-10-CM | POA: Diagnosis not present

## 2023-12-19 DIAGNOSIS — Z7982 Long term (current) use of aspirin: Secondary | ICD-10-CM

## 2023-12-19 DIAGNOSIS — I2510 Atherosclerotic heart disease of native coronary artery without angina pectoris: Secondary | ICD-10-CM

## 2023-12-19 HISTORY — PX: CYSTOSCOPY W/ RETROGRADES: SHX1426

## 2023-12-19 LAB — PROTIME-INR
INR: 1 (ref 0.8–1.2)
Prothrombin Time: 13.5 s (ref 11.4–15.2)

## 2023-12-19 LAB — APTT: aPTT: 27 s (ref 24–36)

## 2023-12-19 SURGERY — TURBT, WITH CHEMOTHERAPEUTIC AGENT INSTILLATION INTO BLADDER
Anesthesia: General

## 2023-12-19 MED ORDER — FENTANYL CITRATE (PF) 250 MCG/5ML IJ SOLN
INTRAMUSCULAR | Status: DC | PRN
  Administered 2023-12-19 (×2): 50 ug via INTRAVENOUS

## 2023-12-19 MED ORDER — FENTANYL CITRATE PF 50 MCG/ML IJ SOSY: 25.0000 ug | PREFILLED_SYRINGE | INTRAMUSCULAR | Status: DC | PRN

## 2023-12-19 MED ORDER — LIDOCAINE HCL (PF) 2 % IJ SOLN
INTRAMUSCULAR | Status: AC
  Filled 2023-12-19: qty 10

## 2023-12-19 MED ORDER — SUGAMMADEX SODIUM 200 MG/2ML IV SOLN
INTRAVENOUS | Status: DC | PRN
  Administered 2023-12-19: 200 mg via INTRAVENOUS

## 2023-12-19 MED ORDER — LIDOCAINE 2% (20 MG/ML) 5 ML SYRINGE
INTRAMUSCULAR | Status: DC | PRN
  Administered 2023-12-19: 60 mg via INTRAVENOUS

## 2023-12-19 MED ORDER — MIDAZOLAM HCL 2 MG/2ML IJ SOLN
INTRAMUSCULAR | Status: DC | PRN
  Administered 2023-12-19: 2 mg via INTRAVENOUS

## 2023-12-19 MED ORDER — PROPOFOL 10 MG/ML IV BOLUS
INTRAVENOUS | Status: DC | PRN
  Administered 2023-12-19: 200 mg via INTRAVENOUS

## 2023-12-19 MED ORDER — PROPOFOL 10 MG/ML IV BOLUS
INTRAVENOUS | Status: AC
  Filled 2023-12-19: qty 20

## 2023-12-19 MED ORDER — IOHEXOL 300 MG/ML  SOLN
INTRAMUSCULAR | Status: DC | PRN
  Administered 2023-12-19: 18 mL

## 2023-12-19 MED ORDER — HYDROCODONE-ACETAMINOPHEN 5-325 MG PO TABS: 1.0000 | ORAL_TABLET | Freq: Four times a day (QID) | ORAL | 0 refills | Status: DC | PRN

## 2023-12-19 MED ORDER — EPHEDRINE SULFATE (PRESSORS) 50 MG/ML IJ SOLN
INTRAMUSCULAR | Status: DC | PRN
  Administered 2023-12-19: 10 mg via INTRAVENOUS

## 2023-12-19 MED ORDER — CHLORHEXIDINE GLUCONATE 0.12 % MT SOLN
15.0000 mL | Freq: Once | OROMUCOSAL | Status: AC
  Administered 2023-12-19: 15 mL via OROMUCOSAL

## 2023-12-19 MED ORDER — LIDOCAINE HCL (PF) 2 % IJ SOLN
INTRAMUSCULAR | Status: AC
  Filled 2023-12-19: qty 5

## 2023-12-19 MED ORDER — ONDANSETRON HCL 4 MG/2ML IJ SOLN
INTRAMUSCULAR | Status: DC | PRN
Start: 2023-12-19 — End: 2023-12-19
  Administered 2023-12-19: 4 mg via INTRAVENOUS

## 2023-12-19 MED ORDER — CEFAZOLIN SODIUM-DEXTROSE 2-4 GM/100ML-% IV SOLN
2.0000 g | INTRAVENOUS | Status: AC
  Administered 2023-12-19: 2 g via INTRAVENOUS
  Filled 2023-12-19: qty 100

## 2023-12-19 MED ORDER — FENTANYL CITRATE (PF) 100 MCG/2ML IJ SOLN
INTRAMUSCULAR | Status: AC
  Filled 2023-12-19: qty 2

## 2023-12-19 MED ORDER — GEMCITABINE CHEMO FOR BLADDER INSTILLATION 2000 MG
2000.0000 mg | Freq: Once | INTRAVENOUS | Status: AC
  Administered 2023-12-19: 2000 mg via INTRAVESICAL
  Filled 2023-12-19: qty 2000

## 2023-12-19 MED ORDER — ROCURONIUM 10MG/ML (10ML) SYRINGE FOR MEDFUSION PUMP - OPTIME
INTRAVENOUS | Status: DC | PRN
  Administered 2023-12-19: 60 mg via INTRAVENOUS

## 2023-12-19 MED ORDER — LACTATED RINGERS IV SOLN: INTRAVENOUS | Status: DC

## 2023-12-19 MED ORDER — MIDAZOLAM HCL 2 MG/2ML IJ SOLN
INTRAMUSCULAR | Status: AC
  Filled 2023-12-19: qty 2

## 2023-12-19 MED ORDER — ACETAMINOPHEN 500 MG PO TABS
1000.0000 mg | ORAL_TABLET | Freq: Once | ORAL | Status: AC
  Administered 2023-12-19: 1000 mg via ORAL
  Filled 2023-12-19: qty 2

## 2023-12-19 MED ORDER — SODIUM CHLORIDE 0.9 % IR SOLN
Status: DC | PRN
  Administered 2023-12-19: 6000 mL via INTRAVESICAL

## 2023-12-19 MED ORDER — AMISULPRIDE (ANTIEMETIC) 5 MG/2ML IV SOLN: 10.0000 mg | Freq: Once | INTRAVENOUS | Status: DC | PRN

## 2023-12-19 MED ORDER — EPHEDRINE 5 MG/ML INJ
INTRAVENOUS | Status: AC
  Filled 2023-12-19: qty 5

## 2023-12-19 MED ORDER — ORAL CARE MOUTH RINSE: 15.0000 mL | Freq: Once | OROMUCOSAL | Status: AC

## 2023-12-19 MED ORDER — DEXAMETHASONE SODIUM PHOSPHATE 10 MG/ML IJ SOLN
INTRAMUSCULAR | Status: DC | PRN
  Administered 2023-12-19: 10 mg via INTRAVENOUS

## 2023-12-19 SURGICAL SUPPLY — 20 items
BAG URINE DRAIN 2000ML AR STRL (UROLOGICAL SUPPLIES) IMPLANT
BAG URO CATCHER STRL LF (MISCELLANEOUS) ×2 IMPLANT
CATH FOLEY 2WAY SLVR 5CC 18FR (CATHETERS) IMPLANT
CATH URETL OPEN END 6FR 70 (CATHETERS) ×2 IMPLANT
CLOTH BEACON ORANGE TIMEOUT ST (SAFETY) ×2 IMPLANT
DRAPE FOOT SWITCH (DRAPES) ×2 IMPLANT
ELECT REM PT RETURN 15FT ADLT (MISCELLANEOUS) IMPLANT
GLOVE BIO SURGEON STRL SZ7.5 (GLOVE) ×2 IMPLANT
GOWN STRL REUS W/ TWL XL LVL3 (GOWN DISPOSABLE) ×2 IMPLANT
GUIDEWIRE STR DUAL SENSOR (WIRE) IMPLANT
KIT TURNOVER KIT A (KITS) ×2 IMPLANT
LOOP CUT BIPOLAR 24F LRG (ELECTROSURGICAL) IMPLANT
MANIFOLD NEPTUNE II (INSTRUMENTS) ×2 IMPLANT
NS IRRIG 1000ML POUR BTL (IV SOLUTION) IMPLANT
PACK CYSTO (CUSTOM PROCEDURE TRAY) ×2 IMPLANT
PAD TELFA 2X3 NADH STRL (GAUZE/BANDAGES/DRESSINGS) IMPLANT
PLUG CATH AND CAP STRL 200 (CATHETERS) IMPLANT
SYRINGE TOOMEY IRRIG 70ML (MISCELLANEOUS) IMPLANT
TUBING CONNECTING 10 (TUBING) ×2 IMPLANT
TUBING UROLOGY SET (TUBING) IMPLANT

## 2023-12-19 NOTE — Discharge Instructions (Addendum)

## 2023-12-19 NOTE — H&P (Signed)
 CC/HPI: CC: Microscopic hematuria  HPI:  08/19/2023  63 year old male referred for microscopic hematuria. Had 3-10 red blood cells per high-power field on 08/09/2023. Does still have some urinary frequency, and nocturia 4 or more times a night. Remains on tamsulosin . He denies any gross hematuria..   10/11/2023  Patient underwent CT stone protocol. He is allergic to contrast so we avoided contrast. He did have an indeterminate renal mass on the left measuring 1.7 cm his partner has been getting recurrent bacterial vaginosis. He asked me about whether or not he should get treatment.   12/19/2023 Patient presents today for TURBT with bilateral retrograde pyelogram and instillation of gemcitabine .    ALLERGIES: No Allergies    MEDICATIONS: Lisinopril  10 MG Tablet  Metoprolol -Hydrochlorothiazide  Warfarin Sodium  5 MG Tablet  Aspirin  EC Low Dose 81 MG Tablet Delayed Release  Multivitamin  Nitroglycerin  0.4 MG Tablet Sublingual  Rosuvastatin  Calcium  40 MG Tablet     GU PSH: No GU PSH      PSH Notes: Heart attack, leg surgery, blood clots   NON-GU PSH: Visit Complexity (formerly GPC1X) - 08/19/2023     GU PMH: Microscopic hematuria - 09/06/2023, - 08/19/2023 BPH w/LUTS - 08/19/2023, - 2022 Nocturia - 08/19/2023, - 2022 Urinary Frequency - 08/19/2023, - 2022 ED due to arterial insufficiency - 2022 Urinary Urgency - 2022 Weak Urinary Stream - 2022      PMH Notes: Heart attack   NON-GU PMH: Hypercholesterolemia Hypertension    FAMILY HISTORY: 1 Daughter - Other Death In The Family Father - Other   SOCIAL HISTORY: Marital Status: Single Preferred Language: English Current Smoking Status: Patient has never smoked.   Tobacco Use Assessment Completed: Used Tobacco in last 30 days? Does not use smokeless tobacco. Does not use drugs. Does not drink caffeine.    REVIEW OF SYSTEMS:    GU Review Male:   Patient denies frequent urination, hard to postpone urination, burning/ pain with  urination, get up at night to urinate, leakage of urine, stream starts and stops, trouble starting your stream, have to strain to urinate , erection problems, and penile pain.  Gastrointestinal (Upper):   Patient denies vomiting, nausea, and indigestion/ heartburn.  Gastrointestinal (Lower):   Patient denies diarrhea and constipation.  Constitutional:   Patient denies fever, night sweats, weight loss, and fatigue.  Skin:   Patient denies skin rash/ lesion and itching.  Eyes:   Patient denies blurred vision and double vision.  Ears/ Nose/ Throat:   Patient denies sore throat and sinus problems.  Hematologic/Lymphatic:   Patient denies swollen glands and easy bruising.  Cardiovascular:   Patient denies leg swelling and chest pains.  Respiratory:   Patient denies cough and shortness of breath.  Endocrine:   Patient denies excessive thirst.  Musculoskeletal:   Patient denies back pain and joint pain.  Neurological:   Patient denies headaches and dizziness.  Psychologic:   Patient denies depression and anxiety.   BP (!) 168/84   Pulse (!) 59   Temp 98.7 F (37.1 C) (Oral)   Resp 17   Ht 5' 9 (1.753 m)   Wt 105 kg   SpO2 98%   BMI 34.19 kg/m   Physical exam: No acute distress Adequate perfusion of extremities Nonlabored respiration Abdomen soft, nontender, nondistended  Prior cystoscopy:  Bladder:  No trabeculation. 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 Risk benefits discussed. He is on warfarin so he  will need clearance.

## 2023-12-19 NOTE — Anesthesia Procedure Notes (Signed)
 Procedure Name: Intubation Date/Time: 12/19/2023 7:46 AM  Performed by: Nanci Riis, CRNAPre-anesthesia Checklist: Patient identified, Emergency Drugs available, Suction available, Patient being monitored and Timeout performed Patient Re-evaluated:Patient Re-evaluated prior to induction Oxygen Delivery Method: Circle system utilized Preoxygenation: Pre-oxygenation with 100% oxygen Induction Type: IV induction Ventilation: Mask ventilation without difficulty Laryngoscope Size: Miller and 3 Grade View: Grade I Tube type: Oral Tube size: 7.5 mm Number of attempts: 1 Airway Equipment and Method: Stylet Placement Confirmation: ETT inserted through vocal cords under direct vision, positive ETCO2 and breath sounds checked- equal and bilateral Secured at: 23 cm Tube secured with: Tape Dental Injury: Teeth and Oropharynx as per pre-operative assessment

## 2023-12-19 NOTE — Anesthesia Postprocedure Evaluation (Signed)
 Anesthesia Post Note  Patient: MARQUIES WANAT  Procedure(s) Performed: TURBT, WITH CHEMOTHERAPEUTIC AGENT INSTILLATION INTO BLADDER CYSTOSCOPY, WITH RETROGRADE PYELOGRAM (Bilateral)     Patient location during evaluation: PACU Anesthesia Type: General Level of consciousness: awake and alert Pain management: pain level controlled Vital Signs Assessment: post-procedure vital signs reviewed and stable Respiratory status: spontaneous breathing, nonlabored ventilation, respiratory function stable and patient connected to nasal cannula oxygen Cardiovascular status: blood pressure returned to baseline and stable Postop Assessment: no apparent nausea or vomiting Anesthetic complications: no   No notable events documented.  Last Vitals:  Vitals:   12/19/23 0945 12/19/23 1000  BP: (!) 159/95 (!) 167/99  Pulse: (!) 57 70  Resp: (!) 21 19  Temp:  36.4 C  SpO2: 95% 97%    Last Pain:  Vitals:   12/19/23 1000  TempSrc:   PainSc: 0-No pain                 Epifanio Lamar BRAVO

## 2023-12-19 NOTE — Transfer of Care (Signed)
 Immediate Anesthesia Transfer of Care Note  Patient: Michael [name]  Procedure(s) Performed: TURBT, WITH CHEMOTHERAPEUTIC AGENT INSTILLATION INTO BLADDER CYSTOSCOPY, WITH RETROGRADE PYELOGRAM (Bilateral)  Patient Location: PACU  Anesthesia Type:General  Level of Consciousness: drowsy and patient cooperative  Airway & Oxygen Therapy: Patient Spontanous Breathing  Post-op Assessment: Report given to RN and Post -op Vital signs reviewed and stable  Post vital signs: Reviewed and stable  Last Vitals:  Vitals Value Taken Time  BP 160/97 12/19/23 08:27  Temp    Pulse 71 12/19/23 08:28  Resp 20 12/19/23 08:28  SpO2 93 % 12/19/23 08:28  Vitals shown include unfiled device data.  Last Pain:  Vitals:   12/19/23 0540  TempSrc: Oral         Complications: No notable events documented.

## 2023-12-19 NOTE — Op Note (Signed)
 Operative Note  Preoperative diagnosis:  1.  Bladder tumor  Postoperative diagnosis: 1.  Bladder tumor--medium, 3 cm  Procedure(s): 1.  Cystoscopy with bilateral retrograde pyelogram 2.  Transurethral resection of bladder tumor--medium 3.  Intravesical instillation of gemcitabine   Surgeon: Sherwood Edison, MD  Assistants: None  Anesthesia: General  Complications: None immediate  EBL: Minimal  Specimens: 1.  Bladder tumor  Drains/Catheters: 1.  18 French Foley catheter  Intraoperative findings: 1.  Normal anterior urethra 2.  Borderline obstructive prostate with high bladder neck 3.  Bladder mucosa with an approximately 3 cm area of slightly raised papillary mucosa appeared superficial on the right lateral wall.  Entire area resected and fulgurated.  4.  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  There were no filling defects or hydronephrosis  5. Left retrograde pyelogram without any filling defect or hydronephrosis  Indication: 63 year old male with a bladder tumor presents for previously mentioned operation  Description of procedure:  The patient was identified and consent was obtained.  The patient was taken to the operating room and placed in the supine position.  The patient was placed under general anesthesia.  Perioperative antibiotics were administered.  The patient was placed in dorsal lithotomy.  Patient was prepped and draped in a standard sterile fashion and a timeout was performed.  A 21 French rigid cystoscope was advanced into the urethra and into the bladder.  Complete cystoscopy was performed with findings noted above.  The left ureter was intubated with an open-ended ureteral catheter and a retrograde pyelogram was performed with findings noted above.  Same was performed on the left again with the findings noted above.  I drained the bladder and withdrew the scope.  A 26 French  resectoscope with visual obturator in place was advanced into the urethra and into the bladder.  I exchanged for the bipolar working element and resected the tumor of interest followed by fulguration of the resection bed.  Specimen was collected.  I inspected the bladder mucosa and there was no evidence of any active bleeding or bladder perforation.  I drained the bladder and withdrew the scope.  I placed a Foley catheter.  Patient tolerated the procedure well and stable postoperatively..  In the PACU, I instilled gemcitabine  into the bladder where it remained for approximately 1 hour prior to proper disposal.  Plan: Follow-up in 1 week for pathology review

## 2023-12-20 ENCOUNTER — Encounter (HOSPITAL_COMMUNITY): Payer: Self-pay | Admitting: Urology

## 2023-12-20 LAB — SURGICAL PATHOLOGY

## 2023-12-21 ENCOUNTER — Encounter: Payer: Self-pay | Admitting: Internal Medicine

## 2023-12-21 DIAGNOSIS — C679 Malignant neoplasm of bladder, unspecified: Secondary | ICD-10-CM | POA: Insufficient documentation

## 2023-12-23 DIAGNOSIS — N281 Cyst of kidney, acquired: Secondary | ICD-10-CM | POA: Diagnosis not present

## 2023-12-23 DIAGNOSIS — R351 Nocturia: Secondary | ICD-10-CM | POA: Diagnosis not present

## 2023-12-23 DIAGNOSIS — C672 Malignant neoplasm of lateral wall of bladder: Secondary | ICD-10-CM | POA: Diagnosis not present

## 2023-12-23 DIAGNOSIS — R350 Frequency of micturition: Secondary | ICD-10-CM | POA: Diagnosis not present

## 2023-12-26 ENCOUNTER — Ambulatory Visit: Attending: Cardiovascular Disease

## 2023-12-26 DIAGNOSIS — I824Y1 Acute embolism and thrombosis of unspecified deep veins of right proximal lower extremity: Secondary | ICD-10-CM

## 2023-12-26 DIAGNOSIS — I2699 Other pulmonary embolism without acute cor pulmonale: Secondary | ICD-10-CM

## 2023-12-26 DIAGNOSIS — Z5181 Encounter for therapeutic drug level monitoring: Secondary | ICD-10-CM

## 2023-12-26 LAB — POCT INR: INR: 2 (ref 2.0–3.0)

## 2023-12-26 NOTE — Progress Notes (Signed)
Please see anticoagulation encounter.

## 2023-12-26 NOTE — Patient Instructions (Signed)
 continue taking Warfarin 1 tablet daily except for 2 tablets on Mondays, Wednesdays and Fridays. Recheck INR in 4 weeks.  Keep dark green leafy veggies consistent in your diet at least 2-3 times in your diet (collards, mixed greens, cabbage, broccoli, or spinach). Coumadin  Clinic [phone].

## 2024-01-05 ENCOUNTER — Other Ambulatory Visit: Payer: Self-pay | Admitting: Internal Medicine

## 2024-01-05 DIAGNOSIS — I10 Essential (primary) hypertension: Secondary | ICD-10-CM

## 2024-01-05 NOTE — Telephone Encounter (Unsigned)
 Copied from CRM [id number]. Topic: Clinical - Medication Refill >> Jan 05, 2024  8:17 AM Ivette P wrote: Medication: metoprolol  tartrate (LOPRESSOR ) 25 MG tablet lisinopril  (ZESTRIL ) 40 MG tablet  Has the patient contacted their pharmacy? Yes (Agent: If no, request that the patient contact the pharmacy for the refill. If patient does not wish to contact the pharmacy document the reason why and proceed with request.) (Agent: If yes, when and what did the pharmacy advise?)  This is the patient's preferred pharmacy:  [id number] - [place] ([place]), [place] - [address] [place] ([place]) [place] [address] Phone: [phone] Fax: [phone]   Is this the correct pharmacy for this prescription? Yes If no, delete pharmacy and type the correct one.   Has the prescription been filled recently? No  Is the patient out of the medication? Yes  Has the patient been seen for an appointment in the last year OR does the patient have an upcoming appointment? Yes  Can we respond through MyChart? Yes  Agent: Please be advised that Rx refills may take up to 3 business days. We ask that you follow-up with your pharmacy.

## 2024-01-06 MED ORDER — LISINOPRIL 40 MG PO TABS: 40.0000 mg | ORAL_TABLET | Freq: Every day | ORAL | 0 refills | Status: DC

## 2024-01-06 NOTE — Telephone Encounter (Signed)
 Requested Prescriptions  Pending Prescriptions Disp Refills   lisinopril  (ZESTRIL ) 40 MG tablet 90 tablet 0    Sig: Take 1 tablet (40 mg total) by mouth daily.     Cardiovascular:  ACE Inhibitors Failed - 01/06/2024  1:49 PM      Failed - Last BP in normal range    BP Readings from Last 1 Encounters:  12/19/23 (!) 167/99         Passed - Cr in normal range and within 180 days    Creat  Date Value Ref Range Status  08/11/2016 1.16 0.70 - 1.33 mg/dL Final    Comment:      For patients > or = 63 years of age: The upper reference limit for Creatinine is approximately 13% higher for people identified as African-American.      Creatinine, Ser  Date Value Ref Range Status  12/06/2023 0.98 0.61 - 1.24 mg/dL Final         Passed - K in normal range and within 180 days    Potassium  Date Value Ref Range Status  12/06/2023 4.0 3.5 - 5.1 mmol/L Final         Passed - Patient is not pregnant      Passed - Valid encounter within last 6 months    Recent Outpatient Visits           5 months ago PAD (peripheral artery disease) (HCC)   [facility] Dept [facility] [place]. [facility] Vicci Barnie NOVAK, MD   8 months ago Essential hypertension   [facility] - [facility] Dept Of [place]. [facility] Vicci Barnie NOVAK, MD   8 months ago Exposure to gonorrhea   [facility] - A Dept Of [place]. [facility] Delbert Clam, MD   1 year ago Essential hypertension   [facility] - A Dept Of [place]. [facility] Vicci Barnie NOVAK, MD   1 year ago Dysuria   [facility] - A Dept Of [place]. [facility] Winder, Haze ORN, NP              Refused Prescriptions Disp Refills   metoprolol  tartrate (LOPRESSOR ) 25 MG tablet 180 tablet 1    Sig: Take 1 tablet (25 mg total) by mouth 2 (two) times daily.     Cardiovascular:  Beta Blockers Failed -  01/06/2024  1:49 PM      Failed - Last BP in normal range    BP Readings from Last 1 Encounters:  12/19/23 (!) 167/99         Passed - Last Heart Rate in normal range    Pulse Readings from Last 1 Encounters:  12/19/23 70         Passed - Valid encounter within last 6 months    Recent Outpatient Visits           5 months ago PAD (peripheral artery disease) (HCC)   [facility] Dept Of [place]. [facility] Vic[place] Barnie NOVAK, MD   8 months ago Essential hypertension   [facility] - [facility] Dept Of [place]. [facility] Barnie NOVAK, MD   8 months ago Exposure to gonorrhea   [facility] - A Dept Of [place]. [facility] Delbert Clam, MD  1 year ago Essential hypertension   [facility] - A Dept Of [place]. [facility] Vicci Barnie NOVAK, MD   1 year ago Dysuria   [facility] - A Dept Of [place]. [facility] Theotis Haze ORN, [place]

## 2024-01-16 ENCOUNTER — Other Ambulatory Visit: Payer: Self-pay | Admitting: Internal Medicine

## 2024-01-17 NOTE — Telephone Encounter (Signed)
 Requested Prescriptions  Pending Prescriptions Disp Refills   ezetimibe  (ZETIA ) 10 MG tablet [Pharmacy Med Name: Ezetimibe  10 MG Oral Tablet] 90 tablet 1    Sig: Take 1 tablet by mouth once daily     Cardiovascular:  Antilipid - Sterol Transport Inhibitors Failed - 01/17/2024 12:06 PM      Failed - Lipid Panel in normal range within the last 12 months    Cholesterol, Total  Date Value Ref Range Status  08/09/2023 115 100 - 199 mg/dL Final   LDL Chol Calc (NIH)  Date Value Ref Range Status  08/09/2023 61 0 - 99 mg/dL Final   Direct LDL  Date Value Ref Range Status  05/04/2013 164.1 mg/dL Final    Comment:    Optimal:  <100 mg/dLNear or Above Optimal:  100-129 mg/dLBorderline High:  130-159 mg/dLHigh:  160-189 mg/dLVery High:  >190 mg/dL   HDL  Date Value Ref Range Status  08/09/2023 40 >39 mg/dL Final   Triglycerides  Date Value Ref Range Status  08/09/2023 63 0 - 149 mg/dL Final         Passed - AST in normal range and within 360 days    AST  Date Value Ref Range Status  12/06/2023 26 15 - 41 U/L Final         Passed - ALT in normal range and within 360 days    ALT  Date Value Ref Range Status  12/06/2023 39 0 - 44 U/L Final         Passed - Patient is not pregnant      Passed - Valid encounter within last 12 months    Recent Outpatient Visits           5 months ago PAD (peripheral artery disease) (HCC)   [facility] Dept Of [place]. [facility] Vicci Barnie NOVAK, MD   8 months ago Essential hypertension   [facility] - [facility] Dept Of [place]. [facility] Vicci Barnie NOVAK, MD   9 months ago Exposure to gonorrhea   [facility] [place] - [facility] Dept Of [place]. [facility] Delbert Clam, MD   1 year ago Essential hypertension   [facility] [place] - A Dept Of [place]. [facility] Vicci Barnie NOVAK, MD   1 year ago Dysuria   [facility] [facility] [place]  - A Dept Of [place]. [facility] Theotis Haze ORN, NP       Future Appointments             In 1 week Jerri Kay HERO, MD [facility]

## 2024-01-23 ENCOUNTER — Ambulatory Visit: Payer: Self-pay | Attending: Cardiovascular Disease

## 2024-01-23 DIAGNOSIS — I2699 Other pulmonary embolism without acute cor pulmonale: Secondary | ICD-10-CM | POA: Diagnosis not present

## 2024-01-23 DIAGNOSIS — Z5181 Encounter for therapeutic drug level monitoring: Secondary | ICD-10-CM

## 2024-01-23 DIAGNOSIS — I824Y1 Acute embolism and thrombosis of unspecified deep veins of right proximal lower extremity: Secondary | ICD-10-CM

## 2024-01-23 LAB — POCT INR: INR: 2.3 (ref 2.0–3.0)

## 2024-01-23 NOTE — Patient Instructions (Signed)
 continue taking Warfarin 1 tablet daily except for 2 tablets on Mondays, Wednesdays and Fridays. Recheck INR in 6 weeks.  Keep dark green leafy veggies consistent in your diet at least 2-3 times in your diet (collards, mixed greens, cabbage, broccoli, or spinach). Coumadin Clinic [phone].

## 2024-01-23 NOTE — Progress Notes (Signed)
 INR-2.3; Please see anticoagulation encounter

## 2024-01-24 ENCOUNTER — Ambulatory Visit: Admitting: Orthopaedic Surgery

## 2024-01-24 ENCOUNTER — Encounter: Payer: Self-pay | Admitting: Orthopaedic Surgery

## 2024-01-24 ENCOUNTER — Other Ambulatory Visit (INDEPENDENT_AMBULATORY_CARE_PROVIDER_SITE_OTHER): Payer: Self-pay

## 2024-01-24 DIAGNOSIS — M1711 Unilateral primary osteoarthritis, right knee: Secondary | ICD-10-CM | POA: Insufficient documentation

## 2024-01-24 NOTE — Progress Notes (Signed)
 Office Visit Note   Patient: Michael [name]           Date of Birth: [birth year]           MRN: [id number] Visit Date: 01/24/2024              Requested by: Vicci Barnie NOVAK, MD [address] [place],  [place] [address] PCP: Vicci Barnie NOVAK, MD   Assessment & Plan: Visit Diagnoses:  1. Primary osteoarthritis of right knee     Plan: History of Present Illness ROSARIO KUSHNER is a 63 year old male who presents with right knee instability and stiffness.  He experiences knee instability and stiffness, with episodes of the knee giving out, particularly after playing pickleball. Swelling occurs if he plays more than four games, but resting for three to four days alleviates the swelling. He underwent a patella tendon repair in 2015 and received a cortisone injection two years ago. He wears a knee brace during activities, which helps, but certain movements, such as changing direction, can cause the knee to give way. He has not experienced any falls but may stumble occasionally.  Physical Exam MEASUREMENTS: Weight- 227.  Examination of the right knee shows a fully healed surgical scar.  No concerning features.  Normal range of motion.  No joint effusion.  Normal motor and sensory function.    Results RADIOLOGY Knee X-ray: No significant progression of arthritis; joint space maintained, consistent with previous findings.  Assessment and Plan Right knee osteoarthritis Chronic osteoarthritis with intermittent swelling and stiffness, exacerbated by activity. No significant x-ray progression. Cartilage irregularity causes giving way sensation. Weight gain may increase knee pressure. - Advise knee brace during activities. - Recommend weight loss to reduce knee pressure. - Discussed cortisone injection option, advised against if symptoms manageable. - Encourage limiting pickleball to four games.  Follow-Up Instructions: No follow-ups on file.   Orders:  Orders Placed  This Encounter  Procedures   XR KNEE 3 VIEW RIGHT   No orders of the defined types were placed in this encounter.     Procedures: No procedures performed   Clinical Data: No additional findings.   Subjective: Chief Complaint  Patient presents with   Right Knee - Pain    HPI  Review of Systems  Constitutional: Negative.   HENT: Negative.    Eyes: Negative.   Respiratory: Negative.    Cardiovascular: Negative.   Gastrointestinal: Negative.   Endocrine: Negative.   Genitourinary: Negative.   Skin: Negative.   Allergic/Immunologic: Negative.   Neurological: Negative.   Hematological: Negative.   Psychiatric/Behavioral: Negative.    All other systems reviewed and are negative.    Objective: Vital Signs: There were no vitals taken for this visit.  Physical Exam Vitals and nursing note reviewed.  Constitutional:      Appearance: He is well-developed.  HENT:     Head: Normocephalic and atraumatic.  Eyes:     Pupils: Pupils are equal, round, and reactive to light.  Pulmonary:     Effort: Pulmonary effort is normal.  Abdominal:     Palpations: Abdomen is soft.  Musculoskeletal:        General: Normal range of motion.     Cervical back: Neck supple.  Skin:    General: Skin is warm.  Neurological:     Mental Status: He is alert and oriented to person, place, and time.  Psychiatric:        Behavior: Behavior normal.  Thought Content: Thought content normal.        Judgment: Judgment normal.     Ortho Exam  Specialty Comments:  No specialty comments available.  Imaging: No results found.   PMFS History: Patient Active Problem List   Diagnosis Date Noted   Primary osteoarthritis of right knee 01/24/2024   Bladder cancer (HCC) 12/21/2023   Prediabetes 12/04/2020   Obesity (BMI 30.0-34.9) 12/04/2020   DVT, lower extremity (HCC) 03/14/2019   History of pulmonary embolism 09/11/2018   Obsessive-compulsive disorder    Muscle cramps     Postoperative pain    Therapeutic opioid induced constipation    CKD (chronic kidney disease), stage II    Acute blood loss anemia    Debility    Ischemia of extremity 01/23/2018   Frequency of urination and polyuria 07/06/2017   Erectile dysfunction due to diseases classified elsewhere 10/27/2016   PAD (peripheral artery disease) (HCC) 08/11/2016   Nocturia 08/11/2016   Chronic pain of both knees 04/01/2016   Palpitations 05/05/2015   Hyperlipidemia 05/05/2015   HNP (herniated nucleus pulposus), cervical 10/14/2014   Chronic low back pain 10/14/2014   Numbness and tingling of both upper extremities while sleeping 10/14/2014   Frequent urination 09/03/2014   Lower abdominal pain 09/03/2014   Cervical stenosis of spine 05/16/2014   Essential hypertension 05/16/2014   Numbness and tingling of right arm 11/28/2013   Dental abscess 04/26/2013   Dysuria 04/26/2013   HTN (hypertension) 04/04/2013   CAD (coronary artery disease) 05/25/2011   Past Medical History:  Diagnosis Date   Alcohol abuse    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 STEMI 2012 - s/p DES to LAD with  diffuse distal disease in the right posterolateral branch. The third OM was occluded and filled from left to left collaterals. LVEF preserved.   DVT (deep venous thrombosis) (HCC)    Dysuria 04/26/2013   Essential hypertension 05/16/2014   Frequent urination 09/03/2014   HNP (herniated nucleus pulposus), cervical 10/14/2014   Hyperlipidemia    Hypertension    Ischemia of extremity 01/23/2018   Lower abdominal pain 09/03/2014   PAD (peripheral artery disease) (HCC)    Pre-diabetes    Premature atrial contraction    PSVT (paroxysmal supraventricular tachycardia) (HCC)    2 short runs on monitor 06/2019   Pulmonary emboli (HCC)    PVC's (premature ventricular contractions)    Renal insufficiency     Special screening for malignant neoplasms, colon     Family History  Problem Relation Age of Onset   Cancer Maternal Aunt    Hyperlipidemia Other    Diabetes Other    Heart disease Other     Past Surgical History:  Procedure Laterality Date   COLONOSCOPY N/A 08/16/2014   Procedure: COLONOSCOPY;  Surgeon: Margo LITTIE Haddock, MD;  [facility];  Service: Endoscopy;  Laterality: N/A;  10:15 AM   CYSTOSCOPY W/ RETROGRADES Bilateral 12/19/2023   Procedure: CYSTOSCOPY, WITH RETROGRADE PYELOGRAM;  Surgeon: Carolee Sherwood JONETTA DOUGLAS, MD;  Location: [facility];  Service: Urology;  Laterality: Bilateral;  BILATERAL RETROGRADE PYELOGRAM NEEDED   DOPPLER ECHOCARDIOGRAPHY     Preserved left venticular function   EMBOLECTOMY Right 01/23/2018   Procedure: RIGHT POPLITEAL-TIBIAL  ARTERY EMBOLECTOMY, EXPLORATION OF RIGHT ANTERIOR TIBIAL ARTERY, RIGHT POPLITEAL ARTERY VEIN ANGIOPLASTY;  Surgeon: Haddock Carlin BRAVO, MD;  Location: [facility];  Service: Vascular;  Laterality: Right;   ENDARTERECTOMY POPLITEAL Left 01/23/2018   Procedure: ENDARTERECTOMY OF LEFT POPLITEAL ARTERY AND TIBIAL-PERONEAL TRUNK;  Surgeon: Harvey Carlin BRAVO, MD;  Location: [facility] OR;  Service: Vascular;  Laterality: Left;   FEMORAL-POPLITEAL BYPASS GRAFT Left 01/23/2018   Procedure: BYPASS GRAFT LEFT FEMORAL-POPLITEAL ARTERY WITH PROPATEN VASCULAR GRAFT;  Surgeon: Harvey Carlin BRAVO, MD;  Location: [facility] OR;  Service: Vascular;  Laterality: Left;   HEMATOMA EVACUATION Left 01/25/2018   Procedure: EVACUATION HEMATOMA LEFT POPLITEAL SPACE;  Surgeon: Gretta Lonni PARAS, MD;  Location: [facility];  Service: Vascular;  Laterality: Left;   LEG SURGERY     left leg - has rod and 4 pins   PATELLAR TENDON REPAIR Right 04/26/2014   Procedure: RIGHT PATELLA TENDON REPAIR;  Surgeon: Evalene JONETTA Chancy, MD;  Location: [facility];  Service: Orthopedics;  Laterality: Right;   Social History   Occupational History   Not on file  Tobacco Use   Smoking  status: Never   Smokeless tobacco: Never  Vaping Use   Vaping status: Never Used  Substance and Sexual Activity   Alcohol use: Not Currently    Comment: Drinks occassionally   Drug use: No   Sexual activity: Yes

## 2024-02-06 ENCOUNTER — Other Ambulatory Visit: Payer: Self-pay | Admitting: Vascular Surgery

## 2024-02-06 DIAGNOSIS — I739 Peripheral vascular disease, unspecified: Secondary | ICD-10-CM

## 2024-02-21 ENCOUNTER — Other Ambulatory Visit: Payer: Self-pay

## 2024-02-21 ENCOUNTER — Encounter (HOSPITAL_COMMUNITY): Payer: Self-pay

## 2024-02-21 ENCOUNTER — Ambulatory Visit (HOSPITAL_COMMUNITY)
Admission: RE | Admit: 2024-02-21 | Discharge: 2024-02-21 | Disposition: A | Discharge status: Home / Self Care | Source: Ambulatory Visit | Attending: Family Medicine | Admitting: Family Medicine

## 2024-02-21 VITALS — BP 155/88 | HR 65 | Temp 98.1°F | Resp 20

## 2024-02-21 DIAGNOSIS — M7989 Other specified soft tissue disorders: Secondary | ICD-10-CM | POA: Diagnosis not present

## 2024-02-21 DIAGNOSIS — Z113 Encounter for screening for infections with a predominantly sexual mode of transmission: Secondary | ICD-10-CM | POA: Insufficient documentation

## 2024-02-21 NOTE — ED Triage Notes (Signed)
 02/15/2024, patient was playing pickle ball and felt like a muscle was pulled.  Applied tiger balm.    Yesterday noticed redness and bruising.  Patient at that time was lifting weights.  Patient could feel discomfort, but was not concerned until seeing this area on right upper arm,  Patient is on blood thinners.  Right upper arm is much larger than left upper arm

## 2024-02-21 NOTE — Discharge Instructions (Signed)
 Ice the bruised area  Please limit using your right arm for the next 2 days.

## 2024-02-21 NOTE — ED Provider Notes (Addendum)
 [facility]    CSN: [id number] Arrival date & time: 02/21/24  1024      History   Chief Complaint Chief Complaint  Patient presents with   Arm Problem   SEXUALLY TRANSMITTED DISEASE    HPI Michael [name] is a 63 y.o. male.   HPI Here for bruising and swelling to his right upper arm.  A few days ago when he was playing pickle ball he felt like he pulled a muscle in his medial right upper arm.  He has been applying Tiger balm and did feel it but it was not hurting a whole lot.  Then yesterday after lifting weights he felt a little more discomfort and saw that he was having some bruising and swelling of the right upper arm.  It actually is looking a little bit better than it did yesterday.  He does take Coumadin  and will be due his pro time in another week or so.  On review of the chart his protimes are usually in a good range between 2 and 3.  He is allergic to IVP dye Past Medical History:  Diagnosis Date   Alcohol abuse    Anterior myocardial infarction [facility])    ST-elevation; S/P emergent  drug-eluting stenting of proximal left anterior descending   Coronary artery disease    a. STEMI 2012 - s/p DES to LAD with  diffuse distal disease in the right posterolateral branch. The third OM was occluded and filled from left to left collaterals. LVEF preserved.   DVT (deep venous thrombosis) (HCC)    Dysuria 04/26/2013   Essential hypertension 05/16/2014   Frequent urination 09/03/2014   HNP (herniated nucleus pulposus), cervical 10/14/2014   Hyperlipidemia    Hypertension    Ischemia of extremity 01/23/2018   Lower abdominal pain 09/03/2014   PAD (peripheral artery disease) (HCC)    Pre-diabetes    Premature atrial contraction    PSVT (paroxysmal supraventricular tachycardia) (HCC)    2 short runs on monitor 06/2019   Pulmonary emboli (HCC)    PVC's (premature ventricular contractions)    Renal insufficiency    Special screening for malignant neoplasms,  colon     Patient Active Problem List   Diagnosis Date Noted   Primary osteoarthritis of right knee 01/24/2024   Bladder cancer (HCC) 12/21/2023   Prediabetes 12/04/2020   Obesity (BMI 30.0-34.9) 12/04/2020   DVT, lower extremity (HCC) 03/14/2019   History of pulmonary embolism 09/11/2018   Obsessive-compulsive disorder    Muscle cramps    Postoperative pain    Therapeutic opioid induced constipation    CKD (chronic kidney disease), stage II    Acute blood loss anemia    Debility    Ischemia of extremity 01/23/2018   Frequency of urination and polyuria 07/06/2017   Erectile dysfunction due to diseases classified elsewhere 10/27/2016   PAD (peripheral artery disease) (HCC) 08/11/2016   Nocturia 08/11/2016   Chronic pain of both knees 04/01/2016   Palpitations 05/05/2015   Hyperlipidemia 05/05/2015   HNP (herniated nucleus pulposus), cervical 10/14/2014   Chronic low back pain 10/14/2014   Numbness and tingling of both upper extremities while sleeping 10/14/2014   Frequent urination 09/03/2014   Lower abdominal pain 09/03/2014   Cervical stenosis of spine 05/16/2014   Essential hypertension 05/16/2014   Numbness and tingling of right arm 11/28/2013   Dental abscess 04/26/2013   Dysuria 04/26/2013   HTN (hypertension) 04/04/2013   CAD (coronary artery disease) 05/25/2011  Past Surgical History:  Procedure Laterality Date   COLONOSCOPY N/A 08/16/2014   Procedure: COLONOSCOPY;  Surgeon: Margo LITTIE Haddock, MD;  [facility];  Service: Endoscopy;  Laterality: N/A;  10:15 AM   CYSTOSCOPY W/ RETROGRADES Bilateral 12/19/2023   Procedure: CYSTOSCOPY, WITH RETROGRADE PYELOGRAM;  Surgeon: Carolee Sherwood JONETTA DOUGLAS, MD;  Location[facility];  Service: Urology;  Laterality: Bilateral;  BILATERAL RETROGRADE PYELOGRAM NEEDED   DOPPLER ECHOCARDIOGRAPHY     Preserved left venticular function   EMBOLECTOMY Right 01/23/2018   Procedure: RIGHT POPLITEAL-TIBIAL  ARTERY EMBOLECTOMY, EXPLORATION  OF RIGHT ANTERIOR TIBIAL ARTERY, RIGHT POPLITEAL ARTERY VEIN ANGIOPLASTY;  Surgeon: Haddock Carlin BRAVO, MD;  Location[facility];  Service: Vascular;  Laterality: Right;   ENDARTERECTOMY POPLITEAL Left 01/23/2018   Procedure: ENDARTERECTOMY OF LEFT POPLITEAL ARTERY AND TIBIAL-PERONEAL TRUNK;  Surgeon: Haddock Carlin BRAVO, MD;  Location: [facility] OR;  Service: Vascular;  Laterality: Left;   FEMORAL-POPLITEAL BYPASS GRAFT Left 01/23/2018   Procedure: BYPASS GRAFT LEFT FEMORAL-POPLITEAL ARTERY WITH PROPATEN VASCULAR GRAFT;  Surgeon: Haddock Carlin BRAVO, MD;  Location: [facility] OR;  Service: Vascular;  Laterality: Left;   HEMATOMA EVACUATION Left 01/25/2018   Procedure: EVACUATION HEMATOMA LEFT POPLITEAL SPACE;  Surgeon: Gretta Lonni PARAS, MD;  [facility];  Service: Vascular;  Laterality: Left;   LEG SURGERY     left leg - has rod and 4 pins   PATELLAR TENDON REPAIR Right 04/26/2014   Procedure: RIGHT PATELLA TENDON REPAIR;  Surgeon: Evalene JONETTA Chancy, MD;  Location: [facility];  Service: Orthopedics;  Laterality: Right;       Home Medications    Prior to Admission medications   Medication Sig Start Date End Date Taking? Authorizing Provider  aspirin  EC 81 MG tablet Take 1 tablet (81 mg total) by mouth daily. 06/21/19   Verlin Lonni JONETTA, MD  diclofenac  Sodium (VOLTAREN ) 1 % GEL Apply 2 g topically 2 (two) times daily as needed. 01/03/23   Vicci Barnie NOVAK, MD  ezetimibe  (ZETIA ) 10 MG tablet Take 1 tablet by mouth once daily 01/17/24   Vicci Barnie NOVAK, MD  gabapentin  (NEURONTIN ) 300 MG capsule TAKE 1 CAPSULE BY MOUTH IN THE MORNING AND 2 IN THE EVENING Patient not taking: Reported on 12/13/2023 08/03/23   Vicci Barnie NOVAK, MD  HYDROcodone -acetaminophen  (NORCO/VICODIN) 5-325 MG tablet Take 1 tablet by mouth every 6 (six) hours as needed. 12/19/23   Carolee Sherwood JONETTA DOUGLAS, MD  latanoprost (XALATAN) 0.005 % ophthalmic solution Place 1 drop into the left eye at bedtime. 11/24/23   [provider]  lisinopril  (ZESTRIL ) 40 MG tablet Take 1 tablet (40 mg total) by mouth daily. 01/06/24   Vicci Barnie NOVAK, MD  metoprolol  tartrate (LOPRESSOR ) 25 MG tablet Take 1 tablet by mouth twice daily 01/05/24   Vicci Barnie NOVAK, MD  Multiple Vitamin (MULTIVITAMIN WITH MINERALS) TABS tablet Take 1 tablet by mouth daily.    [provider]  nitroGLYCERIN  (NITROSTAT ) 0.4 MG SL tablet Place 1 tablet (0.4 mg total) under the tongue every 5 (five) minutes as needed for chest pain. 07/28/20   Verlin Lonni JONETTA, MD  ofloxacin  (OCUFLOX ) 0.3 % ophthalmic solution Place 1 drop into the left eye 4 (four) times daily. Patient taking differently: Place 1 drop into the left eye in the morning and at bedtime. 05/29/23   Avegno, Komlanvi S, FNP  rosuvastatin  (CRESTOR ) 40 MG tablet Take 1 tablet (40 mg total) by mouth daily. 08/09/23   Vicci Barnie NOVAK,  MD  tamsulosin  (FLOMAX ) 0.4 MG CAPS capsule Take 1 capsule (0.4 mg total) by mouth at bedtime. 12/27/22   Vicci Barnie NOVAK, MD  warfarin (COUMADIN ) 5 MG tablet TAKE 1 TO 2 TABLETS BY MOUTH ONCE DAILY AS DIRECTED BY  THE  COUMADIN   CLINIC Patient taking differently: Take 5-10 mg by mouth See admin instructions. Take 10 mg by mouth on Monday, Wednesday and Friday; and take 5 mg on Tuesday, Thursday, Saturday, and Sunday. 11/23/23   Verlin Lonni BIRCH, MD    Family History Family History  Problem Relation Age of Onset   Cancer Maternal Aunt    Hyperlipidemia Other    Diabetes Other    Heart disease Other     Social History Social History   Tobacco Use   Smoking status: Never   Smokeless tobacco: Never  Vaping Use   Vaping status: Never Used  Substance Use Topics   Alcohol use: Not Currently    Comment: Drinks occassionally   Drug use: No     Allergies   Ivp dye [iodinated contrast media]   Review of Systems Review of Systems   Physical Exam Triage Vital Signs ED Triage Vitals  Encounter Vitals Group     BP 02/21/24 1043  (!) 155/88     Girls Systolic BP Percentile --      Girls Diastolic BP Percentile --      Boys Systolic BP Percentile --      Boys Diastolic BP Percentile --      Pulse Rate 02/21/24 1043 65     Resp 02/21/24 1043 20     Temp 02/21/24 1043 98.1 F (36.7 C)     Temp Source 02/21/24 1043 Oral     SpO2 02/21/24 1043 95 %     Weight --      Height --      Head Circumference --      Peak Flow --      Pain Score 02/21/24 1041 5     Pain Loc --      Pain Education --      Exclude from Growth Chart --    No data found.  Updated Vital Signs BP (!) 155/88 (BP Location: Left Arm) Comment (BP Location): large cuff  Pulse 65   Temp 98.1 F (36.7 C) (Oral)   Resp 20   SpO2 95%   Visual Acuity Right Eye Distance:   Left Eye Distance:   Bilateral Distance:    Right Eye Near:   Left Eye Near:    Bilateral Near:     Physical Exam Vitals reviewed.  Constitutional:      General: He is not in acute distress.    Appearance: He is not ill-appearing, toxic-appearing or diaphoretic.  HENT:     Mouth/Throat:     Mouth: Mucous membranes are moist.  Musculoskeletal:     Comments: The upper right arm is definitely enlarged compared to the left upper arm.  There is some bruising noted.  Pulses are normal distally.  Cap refill is normal.  Skin:    Coloration: Skin is not pale.  Neurological:     General: No focal deficit present.     Mental Status: He is alert and oriented to person, place, and time.  Psychiatric:        Behavior: Behavior normal.      UC Treatments / Results  Labs (all labs ordered are listed, but only abnormal results are displayed) Labs Reviewed  CYTOLOGY, (  ORAL, ANAL, URETHRAL) ANCILLARY ONLY    EKG   Radiology No results found.  Procedures Procedures (including critical care time)  Medications Ordered in UC Medications - No data to display  Initial Impression / Assessment and Plan / UC Course  I have reviewed the triage vital signs and the  nursing notes.  Pertinent labs & imaging results that were available during my care of the patient were reviewed by me and considered in my medical decision making (see chart for details).     For completeness sake venous Doppler is ordered of the right upper extremity.  We discussed that this is most likely a small hematoma due to some small muscle tears.  Ace wrap is applied here and he will ice the area and limit right upper arm motion for the next 2 days or so.  He also desired STD testing.  No symptoms.  Urethral self swab is done and staff will notify him of any positives and treat per protocol.   Final Clinical Impressions(s) / UC Diagnoses   Final diagnoses:  Swelling of upper arm     Discharge Instructions      Ice the bruised area  Please limit using your right arm for the next 2 days.       ED Prescriptions   None    PDMP not reviewed this encounter.   Vonna Sharlet POUR, MD 02/21/24 1107    Vonna Sharlet POUR, MD 02/21/24 [phone]

## 2024-02-21 NOTE — ED Notes (Signed)
 Just prior to exiting treatment room, patient mentioned he wanted to get checked for std

## 2024-02-21 NOTE — ED Notes (Signed)
 Arranged appt with patient's acknowledgement and providers approval.  Appt 2:00 02/22/2024 at [address].

## 2024-02-22 ENCOUNTER — Ambulatory Visit (HOSPITAL_COMMUNITY)
Admission: RE | Admit: 2024-02-22 | Discharge: 2024-02-22 | Disposition: A | Discharge status: Home / Self Care | Source: Ambulatory Visit | Attending: Vascular Surgery | Admitting: Vascular Surgery

## 2024-02-22 DIAGNOSIS — M7989 Other specified soft tissue disorders: Secondary | ICD-10-CM | POA: Diagnosis not present

## 2024-02-22 LAB — CYTOLOGY, (ORAL, ANAL, URETHRAL) ANCILLARY ONLY
Chlamydia: NEGATIVE
Comment: NEGATIVE
Comment: NEGATIVE
Comment: NORMAL
Neisseria Gonorrhea: NEGATIVE
Trichomonas: NEGATIVE

## 2024-02-23 ENCOUNTER — Ambulatory Visit (HOSPITAL_COMMUNITY): Payer: Self-pay

## 2024-03-05 ENCOUNTER — Ambulatory Visit: Attending: Cardiovascular Disease

## 2024-03-05 DIAGNOSIS — Z5181 Encounter for therapeutic drug level monitoring: Secondary | ICD-10-CM

## 2024-03-05 DIAGNOSIS — I2699 Other pulmonary embolism without acute cor pulmonale: Secondary | ICD-10-CM

## 2024-03-05 DIAGNOSIS — I824Y1 Acute embolism and thrombosis of unspecified deep veins of right proximal lower extremity: Secondary | ICD-10-CM

## 2024-03-05 LAB — POCT INR: INR: 3.3 — AB (ref 2.0–3.0)

## 2024-03-05 NOTE — Patient Instructions (Signed)
 continue taking Warfarin 1 tablet daily except for 2 tablets on Mondays, Wednesdays and Fridays. Eat greens tonight.  Recheck INR in 6 weeks.  Keep dark green leafy veggies consistent in your diet at least 2-3 times in your diet (collards, mixed greens, cabbage, broccoli, or spinach). Coumadin  Clinic [phone].

## 2024-03-05 NOTE — Progress Notes (Signed)
 INR 3.3 Please see anticoagulation encounter continue taking Warfarin 1 tablet daily except for 2 tablets on Mondays, Wednesdays and Fridays. Eat greens tonight.  Recheck INR in 6 weeks.  Keep dark green leafy veggies consistent in your diet at least 2-3 times in your diet (collards, mixed greens, cabbage, broccoli, or spinach). Coumadin  Clinic [phone].

## 2024-03-08 ENCOUNTER — Other Ambulatory Visit: Payer: Self-pay | Admitting: Cardiovascular Disease

## 2024-03-08 ENCOUNTER — Other Ambulatory Visit: Payer: Self-pay | Admitting: Internal Medicine

## 2024-03-08 DIAGNOSIS — Z5181 Encounter for therapeutic drug level monitoring: Secondary | ICD-10-CM

## 2024-03-08 DIAGNOSIS — I824Y1 Acute embolism and thrombosis of unspecified deep veins of right proximal lower extremity: Secondary | ICD-10-CM

## 2024-03-08 DIAGNOSIS — I739 Peripheral vascular disease, unspecified: Secondary | ICD-10-CM

## 2024-03-08 DIAGNOSIS — I2699 Other pulmonary embolism without acute cor pulmonale: Secondary | ICD-10-CM

## 2024-03-29 DIAGNOSIS — R399 Unspecified symptoms and signs involving the genitourinary system: Secondary | ICD-10-CM | POA: Diagnosis not present

## 2024-03-29 DIAGNOSIS — C674 Malignant neoplasm of posterior wall of bladder: Secondary | ICD-10-CM | POA: Diagnosis not present

## 2024-03-30 ENCOUNTER — Telehealth: Payer: Self-pay | Admitting: Cardiovascular Disease

## 2024-03-30 ENCOUNTER — Other Ambulatory Visit: Payer: Self-pay | Admitting: Urology

## 2024-03-30 MED ORDER — GEMCITABINE CHEMO FOR BLADDER INSTILLATION 2000 MG: 2000.0000 mg | Freq: Once | INTRAVENOUS | Status: AC

## 2024-03-30 NOTE — Telephone Encounter (Signed)
   Name: Michael [name]  DOB: 1960/07/21  MRN: [id number]  Primary Cardiologist: Lonni Cash, MD   Preoperative team, please contact this patient and set up a phone call appointment for further preoperative risk assessment. Please obtain consent and complete medication review. Thank you for your help.  I confirm that guidance regarding antiplatelet and oral anticoagulation therapy has been completed and, if necessary, noted below.  I have reached out to pharmacy concerning coumadin  hold.  Per office protocol, if patient is without any new symptoms or concerns at the time of their virtual visit, he may hold ASA for 5-7  days prior to procedure. Please resume ASA as soon as possible postprocedure, at the discretion of the surgeon.     I also confirmed the patient resides in the state of [place] . As per [facility] Medical Board telemedicine laws, the patient must reside in the state in which the provider is licensed.   [name], NP 03/30/2024, 1:29 PM [facility]

## 2024-03-30 NOTE — Telephone Encounter (Signed)
   Pre-operative Risk Assessment    Patient Name: Michael [name]  DOB: 12-Feb-1961 MRN: [id number]   Date of last office visit: 11/23/2023  Date of next office visit: N/A   Request for Surgical Clearance    Procedure:  resection of bladder tumor  Date of Surgery:  Clearance 04/16/24                                Surgeon:  Sherwood Edison Surgeon's Group or Practice Name:  [facility]  Phone number:  [phone] Fax number:  [phone]   Type of Clearance Requested:   - Pharmacy:  Hold Aspirin  and Warfarin (Coumadin )     Type of Anesthesia:  General    Additional requests/questions:    [name]   03/30/2024, 12:43 PM

## 2024-03-30 NOTE — Telephone Encounter (Signed)
 1st attempt to reach pt regarding surgical clearance and the need for an TELE appointment.  Left pt a detailed message to call back and get that scheduled.

## 2024-03-30 NOTE — Telephone Encounter (Signed)
 Pharmacy please advise on holding coumadin  prior to resection of bladder tumor  scheduled for 04/16/2024. Thank you.

## 2024-04-02 ENCOUNTER — Telehealth (HOSPITAL_BASED_OUTPATIENT_CLINIC_OR_DEPARTMENT_OTHER): Payer: Self-pay | Admitting: *Deleted

## 2024-04-02 NOTE — Telephone Encounter (Signed)
 Pt returning call to schedule. Please advise.

## 2024-04-02 NOTE — Telephone Encounter (Signed)
 I s/w the pt and he has been scheduled tele preop appt 04/10/24. Med rec and consent are done.       Patient Consent for Virtual Visit        Michael [name] has provided verbal consent on 04/02/2024 for a virtual visit (video or telephone).   CONSENT FOR VIRTUAL VISIT FOR:  Michael [name]  By participating in this virtual visit I agree to the following:  I hereby voluntarily request, consent and authorize [facility] and its employed or contracted physicians, physician assistants, nurse practitioners or other licensed health care professionals (the Practitioner), to provide me with telemedicine health care services (the "Services) as deemed necessary by the treating Practitioner. I acknowledge and consent to receive the Services by the Practitioner via telemedicine. I understand that the telemedicine visit will involve communicating with the Practitioner through live audiovisual communication technology and the disclosure of certain medical information by electronic transmission. I acknowledge that I have been given the opportunity to request an in-person assessment or other available alternative prior to the telemedicine visit and am voluntarily participating in the telemedicine visit.  I understand that I have the right to withhold or withdraw my consent to the use of telemedicine in the course of my care at any time, without affecting my right to future care or treatment, and that the Practitioner or I may terminate the telemedicine visit at any time. I understand that I have the right to inspect all information obtained and/or recorded in the course of the telemedicine visit and may receive copies of available information for a reasonable fee.  I understand that some of the potential risks of receiving the Services via telemedicine include:  Delay or interruption in medical evaluation due to technological equipment failure or disruption; Information transmitted may not be  sufficient (e.g. poor resolution of images) to allow for appropriate medical decision making by the Practitioner; and/or  In rare instances, security protocols could fail, causing a breach of personal health information.  Furthermore, I acknowledge that it is my responsibility to provide information about my medical history, conditions and care that is complete and accurate to the best of my ability. I acknowledge that Practitioner's advice, recommendations, and/or decision may be based on factors not within their control, such as incomplete or inaccurate data provided by me or distortions of diagnostic images or specimens that may result from electronic transmissions. I understand that the practice of medicine is not an exact science and that Practitioner makes no warranties or guarantees regarding treatment outcomes. I acknowledge that a copy of this consent can be made available to me via my patient portal [employer] MyChart), or I can request a printed copy by calling the office of [facility].    I understand that my insurance will be billed for this visit.   I have read or had this consent read to me. I understand the contents of this consent, which adequately explains the benefits and risks of the Services being provided via telemedicine.  I have been provided ample opportunity to ask questions regarding this consent and the Services and have had my questions answered to my satisfaction. I give my informed consent for the services to be provided through the use of telemedicine in my medical care

## 2024-04-02 NOTE — Telephone Encounter (Signed)
 I s/w the pt and he has been scheduled tele preop appt 04/10/24. Med rec and consent are done.

## 2024-04-02 NOTE — Telephone Encounter (Signed)
 Patient with diagnosis of DVT/PE on warfarin for anticoagulation.    Procedure: resection of bladder tumor  Date of procedure: 04/16/24   CrCl 114 ml/min Platelet count 188K  Per office protocol, patient can hold warfarin  for 5 days prior to procedure.    Patient will not need bridging with Lovenox (enoxaparin) around procedure.  **This guidance is not considered finalized until pre-operative APP has relayed final recommendations.**

## 2024-04-03 ENCOUNTER — Other Ambulatory Visit: Payer: Self-pay | Admitting: Internal Medicine

## 2024-04-03 ENCOUNTER — Ambulatory Visit: Payer: Medicare HMO | Attending: Internal Medicine

## 2024-04-09 NOTE — Progress Notes (Signed)
 COVID Vaccine Completed: yes  Date of COVID positive in last 90 days:  PCP - Barnie Louder, MD Cardiologist - Lonni Cash, MD  Chest x-ray - N/A EKG - 11/23/23 Epic  Stress Test - N/A ECHO - 11/10/21 Epic Cardiac Cath - yes- 2012 Pacemaker/ICD device last checked:N/A Spinal Cord Stimulator:N/A  Bowel Prep - N/A  Sleep Study - N/A CPAP -   Fasting Blood Sugar - preDM- no Checks Blood Sugar _____ times a day  Last dose of GLP1 agonist-  N/A GLP1 instructions:  Do not take after     Last dose of SGLT-2 inhibitors-  N/A SGLT-2 instructions:  Do not take after     Blood Thinner Instructions: Warfarin, hold 5 days Aspirin  Instructions: ASA 81, hold 7 days Last Dose: 04/10/24 0800  Activity level: Can go up a flight of stairs and perform activities of daily living without stopping and without symptoms of chest pain or shortness of breath.  Anesthesia review: CAD, HTN, PAD, DVT, preDM, PSVT, MI  Patient denies shortness of breath, fever, cough and chest pain at PAT appointment  Patient verbalized understanding of instructions that were given to them at the PAT appointment. Patient was also instructed that they will need to review over the PAT instructions again at home before surgery.

## 2024-04-09 NOTE — Patient Instructions (Signed)
 SURGICAL WAITING ROOM VISITATION  Patients having surgery or a procedure may have no more than 2 support people in the waiting area - these visitors may rotate.    Children under the age of 16 must have an adult with them who is not the patient.  Visitors with respiratory illnesses are discouraged from visiting and should remain at home.  If the patient needs to stay at the hospital during part of their recovery, the visitor guidelines for inpatient rooms apply. Pre-op nurse will coordinate an appropriate time for 1 support person to accompany patient in pre-op.  This support person may not rotate.    Please refer to the [facility] website for the visitor guidelines for Inpatients (after your surgery is over and you are in a regular room).    Your procedure is scheduled on: 04/16/24   Report to [facility] Main Entrance    Report to admitting at 7:15 AM   Call this number if you have problems the morning of surgery [phone]   Do not eat food or drink liquids :After Midnight.          If you have questions, please contact your surgeon's office.   FOLLOW BOWEL PREP AND ANY ADDITIONAL PRE OP INSTRUCTIONS YOU RECEIVED FROM YOUR SURGEON'S OFFICE!!!     Oral Hygiene is also important to reduce your risk of infection.                                    Remember - BRUSH YOUR TEETH THE MORNING OF SURGERY WITH YOUR REGULAR TOOTHPASTE  DENTURES WILL BE REMOVED PRIOR TO SURGERY PLEASE DO NOT APPLY Poly grip OR ADHESIVES!!!   Stop all vitamins and herbal supplements 7 days before surgery.   Take these medicines the morning of surgery with A SIP OF WATER : Tylenol , Zetia , Metoprolol , Rosuvastatin                                You may not have any metal on your body including jewelry, and body piercing             Do not wear lotions, powders, cologne, or deodorant              Men may shave face and neck.   Do not bring valuables to the hospital. [place] IS NOT              RESPONSIBLE   FOR VALUABLES.   Contacts, glasses, dentures or bridgework may not be worn into surgery.  DO NOT BRING YOUR HOME MEDICATIONS TO THE HOSPITAL. PHARMACY WILL DISPENSE MEDICATIONS LISTED ON YOUR MEDICATION LIST TO YOU DURING YOUR ADMISSION IN THE HOSPITAL!    Patients discharged on the day of surgery will not be allowed to drive home.  Someone NEEDS to stay with you for the first 24 hours after anesthesia.   Special Instructions: Bring a copy of your healthcare power of attorney and living will documents the day of surgery if you haven't scanned them before.              Please read over the following fact sheets you were given: IF YOU HAVE QUESTIONS ABOUT YOUR PRE-OP INSTRUCTIONS PLEASE CALL [phone].   If you received a COVID test during your pre-op visit  it is requested that you wear a mask when out  in public, stay away from anyone that may not be feeling well and notify your surgeon if you develop symptoms. If you test positive for Covid or have been in contact with anyone that has tested positive in the last 10 days please notify you surgeon.    [facility] - Preparing for Surgery Before surgery, you can play an important role.  Because skin is not sterile, your skin needs to be as free of germs as possible.  You can reduce the number of germs on your skin by washing with CHG (chlorahexidine gluconate) soap before surgery.  CHG is an antiseptic cleaner which kills germs and bonds with the skin to continue killing germs even after washing. Please DO NOT use if you have an allergy to CHG or antibacterial soaps.  If your skin becomes reddened/irritated stop using the CHG and inform your nurse when you arrive at Short Stay. Do not shave (including legs and underarms) for at least 48 hours prior to the first CHG shower.  You may shave your face/neck.  Please follow these instructions carefully:  1.  Shower with CHG Soap the night before surgery and the morning of  surgery.  2.  If you choose to wash your hair, wash your hair first as usual with your normal  shampoo.  3.  After you shampoo, rinse your hair and body thoroughly to remove the shampoo.                             4.  Use CHG as you would any other liquid soap.  You can apply chg directly to the skin and wash.  Gently with a scrungie or clean washcloth.  5.  Apply the CHG Soap to your body ONLY FROM THE NECK DOWN.   Do   not use on face/ open                           Wound or open sores. Avoid contact with eyes, ears mouth and   genitals (private parts).                       Wash face,  Genitals (private parts) with your normal soap.             6.  Wash thoroughly, paying special attention to the area where your    surgery  will be performed.  7.  Thoroughly rinse your body with warm water  from the neck down.  8.  DO NOT shower/wash with your normal soap after using and rinsing off the CHG Soap.                9.  Pat yourself dry with a clean towel.            10.  Wear clean pajamas.            11.  Place clean sheets on your bed the night of your first shower and do not  sleep with pets. Day of Surgery : Do not apply any CHG, lotions/deodorants the morning of surgery.  Please wear clean clothes to the hospital/surgery center.  FAILURE TO FOLLOW THESE INSTRUCTIONS MAY RESULT IN THE CANCELLATION OF YOUR SURGERY  PATIENT SIGNATURE_________________________________  NURSE SIGNATURE__________________________________  ________________________________________________________________________

## 2024-04-10 ENCOUNTER — Encounter (HOSPITAL_COMMUNITY): Payer: Self-pay

## 2024-04-10 ENCOUNTER — Ambulatory Visit (INDEPENDENT_AMBULATORY_CARE_PROVIDER_SITE_OTHER): Admitting: Emergency Medicine

## 2024-04-10 ENCOUNTER — Encounter (HOSPITAL_COMMUNITY)
Admission: RE | Admit: 2024-04-10 | Discharge: 2024-04-10 | Disposition: A | Discharge status: Home / Self Care | Source: Ambulatory Visit | Attending: Internal Medicine | Admitting: Internal Medicine

## 2024-04-10 ENCOUNTER — Other Ambulatory Visit: Payer: Self-pay

## 2024-04-10 VITALS — BP 145/74 | HR 63 | Temp 98.5°F | Resp 12 | Ht 69.0 in | Wt 232.0 lb

## 2024-04-10 DIAGNOSIS — Z79899 Other long term (current) drug therapy: Secondary | ICD-10-CM | POA: Insufficient documentation

## 2024-04-10 DIAGNOSIS — Z7901 Long term (current) use of anticoagulants: Secondary | ICD-10-CM | POA: Insufficient documentation

## 2024-04-10 DIAGNOSIS — Z01812 Encounter for preprocedural laboratory examination: Secondary | ICD-10-CM | POA: Diagnosis not present

## 2024-04-10 DIAGNOSIS — I10 Essential (primary) hypertension: Secondary | ICD-10-CM | POA: Diagnosis not present

## 2024-04-10 DIAGNOSIS — Z86711 Personal history of pulmonary embolism: Secondary | ICD-10-CM | POA: Insufficient documentation

## 2024-04-10 DIAGNOSIS — Z7982 Long term (current) use of aspirin: Secondary | ICD-10-CM | POA: Diagnosis not present

## 2024-04-10 DIAGNOSIS — I739 Peripheral vascular disease, unspecified: Secondary | ICD-10-CM | POA: Insufficient documentation

## 2024-04-10 DIAGNOSIS — Z01810 Encounter for preprocedural cardiovascular examination: Secondary | ICD-10-CM

## 2024-04-10 DIAGNOSIS — Z86718 Personal history of other venous thrombosis and embolism: Secondary | ICD-10-CM | POA: Insufficient documentation

## 2024-04-10 DIAGNOSIS — I2510 Atherosclerotic heart disease of native coronary artery without angina pectoris: Secondary | ICD-10-CM | POA: Insufficient documentation

## 2024-04-10 DIAGNOSIS — E785 Hyperlipidemia, unspecified: Secondary | ICD-10-CM | POA: Diagnosis not present

## 2024-04-10 HISTORY — DX: Unspecified osteoarthritis, unspecified site: M19.90

## 2024-04-10 HISTORY — DX: Cerebral infarction, unspecified: I63.9

## 2024-04-10 LAB — BASIC METABOLIC PANEL WITH GFR
Anion gap: 9 (ref 5–15)
BUN: 19 mg/dL (ref 8–23)
CO2: 25 mmol/L (ref 22–32)
Calcium: 9.6 mg/dL (ref 8.9–10.3)
Chloride: 103 mmol/L (ref 98–111)
Creatinine, Ser: 1.11 mg/dL (ref 0.61–1.24)
GFR, Estimated: >60 mL/min (ref >60)
Glucose, Bld: 126 mg/dL — ABNORMAL HIGH (ref 70–99)
Potassium: 4.1 mmol/L (ref 3.5–5.1)
Sodium: 136 mmol/L (ref 135–145)

## 2024-04-10 LAB — CBC
HCT: 44.6 % (ref 39.0–52.0)
Hemoglobin: 13.7 g/dL (ref 13.0–17.0)
MCH: 26.9 pg (ref 26.0–34.0)
MCHC: 30.7 g/dL (ref 30.0–36.0)
MCV: 87.5 fL (ref 80.0–100.0)
Platelets: 186 K/uL (ref 150–400)
RBC: 5.1 MIL/uL (ref 4.22–5.81)
RDW: 13.5 % (ref 11.5–15.5)
WBC: 4.7 K/uL (ref 4.0–10.5)
nRBC: 0 % (ref 0.0–0.2)

## 2024-04-10 NOTE — Progress Notes (Signed)
 Virtual Visit via Telephone Note   Because of Michael [name] co-morbid illnesses, he is at least at moderate risk for complications without adequate follow up.  This format is felt to be most appropriate for this patient at this time.  Due to technical limitations with video connection (technology), today's appointment will be conducted as an audio only telehealth visit, and OUSMAN [name] verbally agreed to proceed in this manner.   All issues noted in this document were discussed and addressed.  No physical exam could be performed with this format.  Evaluation Performed:  Preoperative cardiovascular risk assessment _____________   Date:  04/10/2024   Patient ID:  Michael [name], DOB [birth year], MRN [id number] Patient Location:  Home Provider location:   Office  Primary Care Provider:  Vicci Barnie NOVAK, MD Primary Cardiologist:  Lonni Cash, MD  Chief Complaint / Patient Profile   63 y.o. y/o male with a h/o coronary artery disease, DVT/PE, peripheral arterial disease, hyperlipidemia, hypertension who is pending resection of bladder tumor on 04/16/2024 with [facility] and presents today for telephonic preoperative cardiovascular risk assessment.  History of Present Illness    [name] is a 63 y.o. male who presents via audio/video conferencing for a telehealth visit today.  Pt was last seen in cardiology clinic on 11/23/2023 by Scot Ford, PA.  At that time PHILLIP MAFFEI was doing well.  The patient is now pending procedure as outlined above.   Since his last visit, he denies chest pain, shortness of breath, lower extremity edema, fatigue, palpitations, melena, hematuria, hemoptysis, diaphoresis, weakness, presyncope, syncope, orthopnea, and PND.  Today patient is doing well without acute cardiovascular concerns.  He remains active going to the gym 3-4 times a week and playing pickle ball without any exertional symptoms.  He also continues  to do yard work without limitations.  Overall no symptoms to suggest active angina.  He is able to complete greater than 4 METS.  Past Medical History    Past Medical History:  Diagnosis Date   Alcohol abuse    Anterior myocardial infarction [facility])    ST-elevation; S/P emergent  drug-eluting stenting of proximal left anterior descending   Arthritis    Coronary artery disease    a. STEMI 2012 - s/p DES to LAD with  diffuse distal disease in the right posterolateral branch. The third OM was occluded and filled from left to left collaterals. LVEF preserved.   DVT (deep venous thrombosis) (HCC)    Dysuria 04/26/2013   Essential hypertension 05/16/2014   Frequent urination 09/03/2014   HNP (herniated nucleus pulposus), cervical 10/14/2014   Hyperlipidemia    Hypertension    Ischemia of extremity 01/23/2018   Lower abdominal pain 09/03/2014   PAD (peripheral artery disease)    Pre-diabetes    Premature atrial contraction    PSVT (paroxysmal supraventricular tachycardia)    2 short runs on monitor 06/2019   Pulmonary emboli (HCC)    PVC's (premature ventricular contractions)    Renal insufficiency    Special screening for malignant neoplasms, colon    Stroke [facility])    eye   Past Surgical History:  Procedure Laterality Date   COLONOSCOPY N/A 08/16/2014   Procedure: COLONOSCOPY;  Surgeon: Margo LITTIE Haddock, MD;  Location: [facility] SUITE;  Service: Endoscopy;  Laterality: N/A;  10:15 AM   CYSTOSCOPY W/ RETROGRADES Bilateral 12/19/2023   Procedure: CYSTOSCOPY, WITH RETROGRADE PYELOGRAM;  Surgeon: Carolee Sherwood JONETTA DOUGLAS, MD;  Location: [facility] ORS;  Service: Urology;  Laterality: Bilateral;  BILATERAL RETROGRADE PYELOGRAM NEEDED   DOPPLER ECHOCARDIOGRAPHY     Preserved left venticular function   EMBOLECTOMY Right 01/23/2018   Procedure: RIGHT POPLITEAL-TIBIAL  ARTERY EMBOLECTOMY, EXPLORATION OF RIGHT ANTERIOR TIBIAL ARTERY, RIGHT POPLITEAL ARTERY VEIN ANGIOPLASTY;  Surgeon: Harvey Carlin BRAVO, MD;  Location:  [facility] OR;  Service: Vascular;  Laterality: Right;   ENDARTERECTOMY POPLITEAL Left 01/23/2018   Procedure: ENDARTERECTOMY OF LEFT POPLITEAL ARTERY AND TIBIAL-PERONEAL TRUNK;  Surgeon: Harvey Carlin BRAVO, MD;  Location: [facility] OR;  Service: Vascular;  Laterality: Left;   FEMORAL-POPLITEAL BYPASS GRAFT Left 01/23/2018   Procedure: BYPASS GRAFT LEFT FEMORAL-POPLITEAL ARTERY WITH PROPATEN VASCULAR GRAFT;  Surgeon: Harvey Carlin BRAVO, MD;  Location: [facility] OR;  Service: Vascular;  Laterality: Left;   HEMATOMA EVACUATION Left 01/25/2018   Procedure: EVACUATION HEMATOMA LEFT POPLITEAL SPACE;  Surgeon: Gretta Lonni PARAS, MD;  Location: [facility] OR;  Service: Vascular;  Laterality: Left;   LEG SURGERY     left leg - has rod and 4 pins   PATELLAR TENDON REPAIR Right 04/26/2014   Procedure: RIGHT PATELLA TENDON REPAIR;  Surgeon: Evalene JONETTA Chancy, MD;  Location: [facility];  Service: Orthopedics;  Laterality: Right;    Allergies  Allergies  Allergen Reactions   Ivp Dye [Iodinated Contrast Media] Swelling    Home Medications    Prior to Admission medications   Medication Sig Start Date End Date Taking? Authorizing Provider  acetaminophen  (TYLENOL ) 500 MG tablet Take 1,000 mg by mouth every 8 (eight) hours as needed for mild pain (pain score 1-3) or moderate pain (pain score 4-6).    [provider]  aspirin  EC 81 MG tablet Take 1 tablet (81 mg total) by mouth daily. 06/21/19   Verlin Lonni JONETTA, MD  diclofenac  Sodium (VOLTAREN ) 1 % GEL Apply 2 g topically 2 (two) times daily as needed. 01/03/23   Vicci Barnie NOVAK, MD  diphenhydrAMINE  (SOMINEX) 25 MG tablet Take 25 mg by mouth at bedtime as needed for sleep.    [provider]  ezetimibe  (ZETIA ) 10 MG tablet Take 1 tablet by mouth once daily 01/17/24   Vicci Barnie NOVAK, MD  gabapentin  (NEURONTIN ) 300 MG capsule TAKE 1 CAPSULE BY MOUTH IN THE MORNING AND 2 IN THE EVENING Patient not taking: Reported on 12/02/2023 08/03/23   Vicci Barnie NOVAK, MD  latanoprost (XALATAN) 0.005 % ophthalmic solution Place 1 drop into the left eye at bedtime. 11/24/23   [provider]  lisinopril  (ZESTRIL ) 40 MG tablet Take 1 tablet (40 mg total) by mouth daily. 01/06/24   Vicci Barnie NOVAK, MD  metoprolol  tartrate (LOPRESSOR ) 25 MG tablet Take 1 tablet by mouth twice daily 04/04/24   Vicci Barnie NOVAK, MD  Multiple Vitamin (MULTIVITAMIN WITH MINERALS) TABS tablet Take 1 tablet by mouth daily.    [provider]  nitroGLYCERIN  (NITROSTAT ) 0.4 MG SL tablet Place 1 tablet (0.4 mg total) under the tongue every 5 (five) minutes as needed for chest pain. 07/28/20   Verlin Lonni JONETTA, MD  ofloxacin  (OCUFLOX ) 0.3 % ophthalmic solution Place 1 drop into the left eye 4 (four) times daily. Patient not taking: Reported on 04/05/2024 05/29/23   Avegno, Komlanvi S, FNP  rosuvastatin  (CRESTOR ) 40 MG tablet Take 1 tablet by mouth once daily 03/08/24   Vicci Barnie NOVAK, MD  tamsulosin  (FLOMAX ) 0.4 MG CAPS capsule Take 1 capsule (0.4 mg total) by mouth at bedtime. 12/27/22   Vicci Barnie NOVAK, MD  tetrahydrozoline (  REDNESS RELIEVER EYE DROPS) 0.05 % ophthalmic solution Place 1 drop into both eyes daily as needed (red eyes).    [provider]  warfarin (COUMADIN ) 5 MG tablet TAKE 1 TO 2 TABLETS BY MOUTH ONCE DAILY AS DIRECTED BY  THE  COUMADIN   CLINIC Patient taking differently: Take 5 mg by mouth See admin instructions. Take 10 mg on Monday, Wednesday and Friday, all the other days take 5 mg in the morning DIRECTED BY  THE  COUMADIN   CLINIC 03/08/24   Verlin Lonni BIRCH, MD    Physical Exam    Vital Signs:  SANKALP FERRELL does not have vital signs available for review today.  Given telephonic nature of communication, physical exam is limited. AAOx3. NAD. Normal affect.  Speech and respirations are unlabored.  Accessory Clinical Findings    None  Assessment & Plan    1.  Preoperative Cardiovascular Risk  Assessment: According to the Revised Cardiac Risk Index (RCRI), his Perioperative Risk of Major Cardiac Event is (%): 0.9. His Functional Capacity in METs is: 7.93 according to the [facility] Activity Status Index (DASI).  Therefore, based on ACC/AHA guidelines, patient would be at acceptable risk for the planned procedure without further cardiovascular testing. I will route this recommendation to the requesting party via Epic fax function.  The patient was advised that if he develops new symptoms prior to surgery to contact our office to arrange for a follow-up visit, and he verbalized understanding.  Per office protocol, patient can hold warfarin  for 5 days prior to procedure.     Patient will not need bridging with Lovenox (enoxaparin) around procedure.   He may hold ASA for 5-7  days prior to procedure. Please resume ASA as soon as possible postprocedure, at the discretion of the surgeon   A copy of this note will be routed to requesting surgeon.  Time:   Today, I have spent 10 minutes with the patient with telehealth technology discussing medical history, symptoms, and management plan.     Lum LITTIE Louis, NP  04/10/2024, 1:46 PM

## 2024-04-11 ENCOUNTER — Encounter (HOSPITAL_COMMUNITY): Payer: Self-pay | Admitting: Physician Assistant

## 2024-04-11 ENCOUNTER — Ambulatory Visit: Payer: Self-pay | Admitting: Internal Medicine

## 2024-04-11 ENCOUNTER — Ambulatory Visit: Attending: Cardiovascular Disease | Admitting: *Deleted

## 2024-04-11 DIAGNOSIS — Z5181 Encounter for therapeutic drug level monitoring: Secondary | ICD-10-CM | POA: Diagnosis not present

## 2024-04-11 DIAGNOSIS — I2699 Other pulmonary embolism without acute cor pulmonale: Secondary | ICD-10-CM

## 2024-04-11 DIAGNOSIS — I824Y1 Acute embolism and thrombosis of unspecified deep veins of right proximal lower extremity: Secondary | ICD-10-CM | POA: Diagnosis not present

## 2024-04-11 LAB — POCT INR: INR: 3.3 — AB (ref 2.0–3.0)

## 2024-04-11 NOTE — Progress Notes (Incomplete)
 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 never smoker with h/o HTN, PE, DVT, CAD, bladder cancer scheduled for above procedure 04/16/24 with Dr. Sherwood Carolee.   S/p TURBT 12/19/2023 with no anesthesia complications noted.   Per cardiology preoperative evaluation 04/10/24, According to the Revised Cardiac Risk Index (RCRI), his Perioperative Risk of Major Cardiac Event is (%): 0.9. His Functional Capacity in METs is: 7.93 according to the Duke Activity Status Index (DASI).   Therefore, based on ACC/AHA guidelines, patient would be at acceptable risk for the planned procedure without further cardiovascular testing. I will route this recommendation to the requesting party via Epic fax function.   The patient was advised that if he develops new symptoms prior to surgery to contact our office to arrange for a follow-up visit, and he verbalized understanding.   Per office protocol, patient can hold warfarin  for 5 days prior to procedure.   VS: BP (!) 145/74   Pulse 63   Temp 36.9 C (Oral)   Resp 12   Ht 5' 9 (1.753 m)   Wt 105.2 kg   SpO2 99%   BMI 34.26 kg/m   PROVIDERS: Lavona Agent, MD is PCP   Primary Cardiologist:  Lonni Cash, MD  LABS: Labs reviewed: Acceptable for surgery. (all labs ordered are listed, but only abnormal results are displayed)  Labs Reviewed  BASIC METABOLIC PANEL WITH GFR - Abnormal; Notable for the following components:      Result Value   Glucose, Bld 126 (*)    All other components within normal limits  CBC      IMAGES:   EKG:   CV: Echo 11/10/21 1. Global hypokinesis with mid-apical anterolateral and apical anterior  akinesis. Left ventricular ejection fraction, by estimation, is 45 to 50%.  The left ventricle has mildly decreased function. The left ventricle  demonstrates global hypokinesis. The  left ventricular internal cavity size was mildly dilated. There is mild  concentric left ventricular hypertrophy. Left ventricular diastolic  parameters are consistent with Grade II diastolic dysfunction  (pseudonormalization).   2. Right ventricular systolic function is normal. The right ventricular  size is normal.   3. Left atrial size was severely dilated.   4. The mitral valve is normal in structure. Mild mitral valve  regurgitation. No evidence of mitral stenosis.   5. The aortic valve is tricuspid. Aortic valve regurgitation is not  visualized. No aortic stenosis is present.   6. Aortic dilatation noted. There is mild dilatation of the aortic root  and of the ascending aorta, measuring 36 mm.   7. The inferior vena cava is normal in size with greater than 50%  respiratory variability, suggesting right atrial pressure of 3 mmHg.  Past Medical History:  Diagnosis Date   Alcohol abuse    Anterior myocardial infarction [facility])    ST-elevation; S/P emergent  drug-eluting stenting of proximal left anterior descending   Arthritis    Coronary artery disease    a. STEMI 2012 - s/p DES to LAD with  diffuse distal disease in the  right posterolateral branch. The third OM was occluded and filled from left to left collaterals. LVEF preserved.   DVT (deep venous thrombosis) (HCC)    Dysuria 04/26/2013   Essential hypertension 05/16/2014   Frequent urination 09/03/2014   HNP (herniated nucleus pulposus), cervical 10/14/2014   Hyperlipidemia    Hypertension    Ischemia of extremity 01/23/2018   Lower abdominal pain 09/03/2014   PAD (peripheral artery disease)    Pre-diabetes    Premature  atrial contraction    PSVT (paroxysmal supraventricular tachycardia)    2 short runs on monitor 06/2019   Pulmonary emboli (HCC)    PVC's (premature ventricular contractions)    Renal insufficiency    Special screening for malignant neoplasms, colon    Stroke [facility])    eye    Past Surgical History:  Procedure Laterality Date   COLONOSCOPY N/A 08/16/2014   Procedure: COLONOSCOPY;  Surgeon: Margo LITTIE Haddock, MD;  [facility];  Service: Endoscopy;  Laterality: N/A;  10:15 AM   CYSTOSCOPY W/ RETROGRADES Bilateral 12/19/2023   Procedure: CYSTOSCOPY, WITH RETROGRADE PYELOGRAM;  Surgeon: Carolee Sherwood JONETTA DOUGLAS, MD;  Location: [facility];  Service: Urology;  Laterality: Bilateral;  BILATERAL RETROGRADE PYELOGRAM NEEDED   DOPPLER ECHOCARDIOGRAPHY     Preserved left venticular function   EMBOLECTOMY Right 01/23/2018   Procedure: RIGHT POPLITEAL-TIBIAL  ARTERY EMBOLECTOMY, EXPLORATION OF RIGHT ANTERIOR TIBIAL ARTERY, RIGHT POPLITEAL ARTERY VEIN ANGIOPLASTY;  Surgeon: Haddock Carlin BRAVO, MD;  Location: [facility] OR;  Service: Vascular;  Laterality: Right;   ENDARTERECTOMY POPLITEAL Left 01/23/2018   Procedure: ENDARTERECTOMY OF LEFT POPLITEAL ARTERY AND TIBIAL-PERONEAL TRUNK;  Surgeon: Haddock Carlin BRAVO, MD;  Location: [facility] OR;  Service: Vascular;  Laterality: Left;   FEMORAL-POPLITEAL BYPASS GRAFT Left 01/23/2018   Procedure: BYPASS GRAFT LEFT FEMORAL-POPLITEAL ARTERY WITH PROPATEN VASCULAR GRAFT;  Surgeon: Haddock Carlin BRAVO, MD;  Location: [facility] OR;  Service: Vascular;  Laterality: Left;   HEMATOMA EVACUATION Left 01/25/2018   Procedure: EVACUATION HEMATOMA LEFT POPLITEAL SPACE;  Surgeon: Gretta Lonni PARAS, MD;  Location: [facility] OR;  Service: Vascular;  Laterality: Left;   LEG SURGERY     left leg - has rod and 4 pins   PATELLAR TENDON REPAIR Right 04/26/2014   Procedure: RIGHT PATELLA TENDON REPAIR;  Surgeon: Evalene JONETTA Chancy, MD;  Location: [facility];  Service: Orthopedics;  Laterality: Right;     MEDICATIONS:  acetaminophen  (TYLENOL ) 500 MG tablet   aspirin  EC 81 MG tablet   diclofenac  Sodium (VOLTAREN ) 1 % GEL   diphenhydrAMINE  (SOMINEX) 25 MG tablet   ezetimibe  (ZETIA ) 10 MG tablet   gabapentin  (NEURONTIN ) 300 MG capsule   latanoprost (XALATAN) 0.005 % ophthalmic solution   lisinopril  (ZESTRIL ) 40 MG tablet   metoprolol  tartrate (LOPRESSOR ) 25 MG tablet   Multiple Vitamin (MULTIVITAMIN WITH MINERALS) TABS tablet   nitroGLYCERIN  (NITROSTAT ) 0.4 MG SL tablet   ofloxacin  (OCUFLOX ) 0.3 % ophthalmic solution   rosuvastatin  (CRESTOR ) 40 MG tablet   tamsulosin  (FLOMAX ) 0.4 MG CAPS capsule   tetrahydrozoline (REDNESS RELIEVER EYE DROPS) 0.05 % ophthalmic solution   warfarin (COUMADIN ) 5 MG tablet   No current facility-administered medications for this encounter.    gemcitabine  (GEMZAR ) chemo syringe for bladder instillation 2,000 mg      Harlene Hoots Ward, PA-C [facility] Pre-Surgical Testing [phone]

## 2024-04-11 NOTE — Progress Notes (Signed)
 Description   INR-3.3; Start holding for upcoming procedure.  Post procedure resume taking warfarin 1 tablet daily except for 2 tablets on Mondays, Wednesdays and Fridays. Eat greens tonight. Recheck INR in 1 week post procedure (normally 6 weeks).  Keep dark green leafy veggies consistent in your diet at least 2-3 times in your diet (collards, mixed greens, cabbage, broccoli, or spinach). Coumadin  Clinic [phone].

## 2024-04-11 NOTE — Patient Instructions (Addendum)
 Description   INR-3.3; Start holding for upcoming procedure.  Post procedure resume taking warfarin 1 tablet daily except for 2 tablets on Mondays, Wednesdays and Fridays. Eat greens tonight. Recheck INR in 1 week post procedure (normally 6 weeks).  Keep dark green leafy veggies consistent in your diet at least 2-3 times in your diet (collards, mixed greens, cabbage, broccoli, or spinach). Coumadin  Clinic [phone].      Start holding warfarin 04/11/24-04/15/24. Then after procedure resume warfarin dose, which is, 1 tablet daily except for 2 tablets on Mondays, Wednesdays and Fridays. Call if there is a delay in resuming your warfarin.

## 2024-04-16 ENCOUNTER — Encounter: Payer: Self-pay | Admitting: Radiology

## 2024-04-16 ENCOUNTER — Ambulatory Visit (HOSPITAL_COMMUNITY): Admission: RE | Admit: 2024-04-16 | Admitting: Urology

## 2024-04-16 ENCOUNTER — Encounter

## 2024-04-16 ENCOUNTER — Encounter (HOSPITAL_COMMUNITY): Payer: Self-pay

## 2024-04-16 ENCOUNTER — Encounter: Admission: RE | Payer: Self-pay

## 2024-04-16 SURGERY — TURBT, WITH CHEMOTHERAPEUTIC AGENT INSTILLATION INTO BLADDER
Anesthesia: General

## 2024-04-16 NOTE — H&P (Signed)
 Historical Summary: CC: Microscopic hematuria HPI: 08/19/2023 63 year old male referred for microscopic hematuria. Had 3-10 red blood cells per high-power field on 08/09/2023. Does still have some urinary frequency, and nocturia 4 or more times a night. Remains on tamsulosin . He denies any gross hematuria.. 10/11/2023 Patient underwent CT stone protocol. He is allergic to contrast so we avoided contrast. He did have an indeterminate renal mass on the left measuring 1.7 cm his partner has been getting recurrent bacterial vaginosis. He asked me about whether or not he should get treatment. 12/23/2023 Status post TURBT with instillation of gemcitabine . Entire tumor was resected. 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 Tumor was about 3 cm. 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 No concerning masses. 03/29/2024 Patient presents today for surveillance cystoscopy.  Exam: Exam Appearance: well developed and nourished, in no acute distress Neck Exam: neck is supple Respiratory Effort: normal respiratory effort without labored breathing  Skin Inspection: normal skin turgor Orientation: alert and oriented to person, place, time Mood: mood and affect well-adjusted, pleasant and cooperative, appropriate for clinical and encounter circumstances  Tests Cystoscopy Procedure: cystourethroscopy Consent: We discussed the role of endoscopy for further evaluation and treatment. I described the procedure in detail along with the risks, benefits and alternatives. We also reviewed the possibility of additional interventions being deemed appropriate and performed based on findings during the procedure. The patient wishes to proceed. Preparation and Positioning: The genital area was prepped and draped in standard sterile fashion. No antibiotic was administered prior to the procedure. The patient was placed in the supine position. Flexible Cystourethroscopy: A  15.5 Fr flexible cystoscope was used to intubate the urethral meatus. The urethra was traversed under direct vision and the bladder was entered. The bladder was inspected in a systematic fashion. Retroflexion was performed. At the end of the procedure, the scope was removed without difficulty. Ciprofloxacin  500mg  was administered after the procedure. Postcare: The patient was informed that it will be normal to experience some irritative voiding symptoms and blood in the urine over the next 24 hours. Maintaining adequate hydration and flushing out the urinary tract will help speed the resolution of these symptoms. The patient has been instructed to seek immediate medical attention if the symptoms persist beyond 24 hours, are intolerable or are accompanied by back pain, fevers or chills. These might be signs of an Visit Note - March 29, 2024 Nareg, Breighner MRN: [id number] Phone: [phone] DOB: 1961-05-27 Sex: Male PMS ID: [id number] [facility] (Primary Provider) (Bill Under) Page 2 [phone] Work [phone] Fax [facility] [address] [place] [address] [place], [place] [phone] infection and may warrant further evaluation or treatment. The patient is also instructed to call the office with any additional questions or concerns. General cystoscopic findings:   Other findings: Patient had trilobar hypertrophy with a large median lobe. Bladder mucosa with prior TUR scar on the right lateral wall closer to the dome. More inferiorly, had slightly raised mucosa that may represent low-grade bladder tumor. 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 SURGERY SCHEDULING ORDER Comments: TURBT with instillation of gemcitabine  Diagnosis codes: Bladder Cancer C67.4 Surgeon: Sherwood Edison. Anesthesia: general. Admission Status: outpatient. Clearance type(s): Use clearance from June that is in urochart Preop  Medications Required: Ancef  2 g - 2 g iv before surgery (prior to incision) Postop Medications Required: Other Medications - Intravesical gemcitabine  2 g Instructions: Follow-up with  MD 1 week after Provider: SHERWOOD EDISON Priority: normal Plan: Separate and Identifiable Documentation.   Further management including scheduling for surgical procedure Plan: Counseling for Bladder Cancer. Please refer to the education handout for detailed counseling. After counseling, we decided on the following plan: Intravesical Therapy and TURBT 1. Staff: SHERWOOD EDISON (Primary Provider) (Bill Under) JESSICA TORRES-GALLEGOS Electronically Signed By: SHERWOOD EDISON, 03/29/2024 04:42 PM EDT

## 2024-04-24 ENCOUNTER — Ambulatory Visit: Attending: Cardiovascular Disease

## 2024-04-24 ENCOUNTER — Telehealth: Payer: Self-pay | Admitting: *Deleted

## 2024-04-24 ENCOUNTER — Telehealth: Payer: Self-pay

## 2024-04-24 NOTE — Telephone Encounter (Signed)
 Letter sent to patient to contact [facility] to schedule AWV.  Also a letter was sent vis MyChart.  Quang Thorpe N. Tomie, LPN [facility] Annual Wellness Team Direct Dial: [phone]

## 2024-04-24 NOTE — Telephone Encounter (Signed)
 Called patient since he missed his anticoagulation appointment today, there was no answer so left a message. Also, need to inquire if he has another date for the procedure since the other one was canceled.

## 2024-04-30 ENCOUNTER — Other Ambulatory Visit: Payer: Self-pay | Admitting: Vascular Surgery

## 2024-04-30 ENCOUNTER — Other Ambulatory Visit: Payer: Self-pay | Admitting: Internal Medicine

## 2024-04-30 ENCOUNTER — Other Ambulatory Visit: Payer: Self-pay | Admitting: Urology

## 2024-04-30 DIAGNOSIS — I739 Peripheral vascular disease, unspecified: Secondary | ICD-10-CM

## 2024-05-02 MED ORDER — GEMCITABINE CHEMO FOR BLADDER INSTILLATION 2000 MG: 2000.0000 mg | Freq: Once | INTRAVENOUS | Status: AC

## 2024-05-03 ENCOUNTER — Ambulatory Visit: Attending: Cardiovascular Disease

## 2024-05-03 DIAGNOSIS — I824Y1 Acute embolism and thrombosis of unspecified deep veins of right proximal lower extremity: Secondary | ICD-10-CM

## 2024-05-03 DIAGNOSIS — Z5181 Encounter for therapeutic drug level monitoring: Secondary | ICD-10-CM

## 2024-05-03 DIAGNOSIS — I2699 Other pulmonary embolism without acute cor pulmonale: Secondary | ICD-10-CM | POA: Diagnosis not present

## 2024-05-03 LAB — POCT INR: INR: 2.7 (ref 2.0–3.0)

## 2024-05-03 NOTE — Patient Instructions (Signed)
 TURBT 12/12 Holding 12/7-11 Post procedure resume taking warfarin 1 tablet daily except for 2 tablets on Mondays, Wednesdays and Fridays.  Recheck INR in 4 week post procedure (normally 6 weeks).  Keep dark green leafy veggies consistent in your diet at least 2-3 times in your diet (collards, mixed greens, cabbage, broccoli, or spinach). Coumadin  Clinic [phone].

## 2024-05-03 NOTE — Progress Notes (Signed)
 INR 2.7 Please see anticoagulation encounter TURBT 12/12 Holding 12/7-11 Post procedure resume taking warfarin 1 tablet daily except for 2 tablets on Mondays, Wednesdays and Fridays.  Recheck INR in 4 week post procedure (normally 6 weeks).  Keep dark green leafy veggies consistent in your diet at least 2-3 times in your diet (collards, mixed greens, cabbage, broccoli, or spinach). Coumadin  Clinic [phone].

## 2024-05-08 DIAGNOSIS — I2510 Atherosclerotic heart disease of native coronary artery without angina pectoris: Secondary | ICD-10-CM | POA: Diagnosis not present

## 2024-05-08 DIAGNOSIS — K056 Periodontal disease, unspecified: Secondary | ICD-10-CM | POA: Diagnosis not present

## 2024-05-08 DIAGNOSIS — Z8673 Personal history of transient ischemic attack (TIA), and cerebral infarction without residual deficits: Secondary | ICD-10-CM | POA: Diagnosis not present

## 2024-05-08 DIAGNOSIS — R739 Hyperglycemia, unspecified: Secondary | ICD-10-CM | POA: Diagnosis not present

## 2024-05-08 DIAGNOSIS — I719 Aortic aneurysm of unspecified site, without rupture: Secondary | ICD-10-CM | POA: Diagnosis not present

## 2024-05-08 DIAGNOSIS — Z818 Family history of other mental and behavioral disorders: Secondary | ICD-10-CM | POA: Diagnosis not present

## 2024-05-08 DIAGNOSIS — Z7982 Long term (current) use of aspirin: Secondary | ICD-10-CM | POA: Diagnosis not present

## 2024-05-08 DIAGNOSIS — I252 Old myocardial infarction: Secondary | ICD-10-CM | POA: Diagnosis not present

## 2024-05-08 DIAGNOSIS — I10 Essential (primary) hypertension: Secondary | ICD-10-CM | POA: Diagnosis not present

## 2024-05-08 DIAGNOSIS — C679 Malignant neoplasm of bladder, unspecified: Secondary | ICD-10-CM | POA: Diagnosis not present

## 2024-05-08 DIAGNOSIS — Z7901 Long term (current) use of anticoagulants: Secondary | ICD-10-CM | POA: Diagnosis not present

## 2024-05-08 DIAGNOSIS — E785 Hyperlipidemia, unspecified: Secondary | ICD-10-CM | POA: Diagnosis not present

## 2024-05-08 DIAGNOSIS — Z6835 Body mass index (BMI) 35.0-35.9, adult: Secondary | ICD-10-CM | POA: Diagnosis not present

## 2024-05-08 DIAGNOSIS — H409 Unspecified glaucoma: Secondary | ICD-10-CM | POA: Diagnosis not present

## 2024-05-08 DIAGNOSIS — D84821 Immunodeficiency due to drugs: Secondary | ICD-10-CM | POA: Diagnosis not present

## 2024-05-08 DIAGNOSIS — I739 Peripheral vascular disease, unspecified: Secondary | ICD-10-CM | POA: Diagnosis not present

## 2024-05-08 DIAGNOSIS — N400 Enlarged prostate without lower urinary tract symptoms: Secondary | ICD-10-CM | POA: Diagnosis not present

## 2024-05-08 DIAGNOSIS — D6869 Other thrombophilia: Secondary | ICD-10-CM | POA: Diagnosis not present

## 2024-05-09 ENCOUNTER — Other Ambulatory Visit: Payer: Self-pay | Admitting: Internal Medicine

## 2024-05-14 ENCOUNTER — Other Ambulatory Visit: Payer: Self-pay | Admitting: Internal Medicine

## 2024-05-14 ENCOUNTER — Telehealth: Payer: Self-pay

## 2024-05-14 MED ORDER — METOPROLOL TARTRATE 25 MG PO TABS: 25.0000 mg | ORAL_TABLET | Freq: Two times a day (BID) | ORAL | 0 refills | Status: DC

## 2024-05-14 NOTE — Telephone Encounter (Unsigned)
 Copied from CRM #[id number]. Topic: Clinical - Prescription Issue >> May 14, 2024  8:15 AM Emylou G wrote: Reason for CRM: Patient on waitlist for 1/27 appt.. said metoprolol  tartrate (LOPRESSOR ) 25 MG tablet was refused - he was hoping he can still get a refill?  Pls call patient if he can't get it.. he is concerned.[facility]

## 2024-05-14 NOTE — Telephone Encounter (Addendum)
 Call recived for patient. Patient requested medication refills , advised that he need an OV patient schedule for 05/15/2024

## 2024-05-14 NOTE — Telephone Encounter (Signed)
 Has appt tomorrow. Keep appt Rxn sent for 10 tabs

## 2024-05-14 NOTE — Telephone Encounter (Signed)
 Called & spoke to the patient. Verified name & DOB. Informed of that a refill of 10 tablets for metoprolol  was sent to the pharmacy. Advised to keep appointment for tomorrow 05/15/24. Patient confirmed visit and expressed verbal understanding of all discussed.

## 2024-05-15 ENCOUNTER — Encounter: Payer: Self-pay | Admitting: Family Medicine

## 2024-05-15 ENCOUNTER — Ambulatory Visit: Attending: Family Medicine | Admitting: Family Medicine

## 2024-05-15 ENCOUNTER — Encounter (HOSPITAL_COMMUNITY): Payer: Self-pay

## 2024-05-15 VITALS — BP 162/93 | HR 73 | Temp 98.3°F | Ht 69.0 in | Wt 235.6 lb

## 2024-05-15 DIAGNOSIS — I10 Essential (primary) hypertension: Secondary | ICD-10-CM | POA: Diagnosis not present

## 2024-05-15 DIAGNOSIS — R350 Frequency of micturition: Secondary | ICD-10-CM | POA: Diagnosis not present

## 2024-05-15 DIAGNOSIS — I739 Peripheral vascular disease, unspecified: Secondary | ICD-10-CM | POA: Diagnosis not present

## 2024-05-15 DIAGNOSIS — R7303 Prediabetes: Secondary | ICD-10-CM

## 2024-05-15 DIAGNOSIS — Z86711 Personal history of pulmonary embolism: Secondary | ICD-10-CM | POA: Diagnosis not present

## 2024-05-15 DIAGNOSIS — R3589 Other polyuria: Secondary | ICD-10-CM | POA: Diagnosis not present

## 2024-05-15 LAB — POCT GLYCOSYLATED HEMOGLOBIN (HGB A1C): HbA1c, POC (prediabetic range): 6.2 % (ref 5.7–6.4)

## 2024-05-15 MED ORDER — METOPROLOL TARTRATE 25 MG PO TABS: 25.0000 mg | ORAL_TABLET | Freq: Two times a day (BID) | ORAL | 1 refills | Status: AC

## 2024-05-15 MED ORDER — EZETIMIBE 10 MG PO TABS: 10.0000 mg | ORAL_TABLET | Freq: Every day | ORAL | 1 refills | Status: AC

## 2024-05-15 MED ORDER — ROSUVASTATIN CALCIUM 40 MG PO TABS: 40.0000 mg | ORAL_TABLET | Freq: Every day | ORAL | 1 refills | Status: AC

## 2024-05-15 MED ORDER — LISINOPRIL 40 MG PO TABS: 40.0000 mg | ORAL_TABLET | Freq: Every day | ORAL | 1 refills | Status: AC

## 2024-05-15 MED ORDER — TAMSULOSIN HCL 0.4 MG PO CAPS: 0.4000 mg | ORAL_CAPSULE | Freq: Every day | ORAL | 1 refills | Status: AC

## 2024-05-15 NOTE — Progress Notes (Signed)
 Subjective:  Patient ID: Michael [name], male    DOB: [birth year]  Age: 63 y.o. MRN: [id number]  CC: Medical Management of Chronic Issues     Discussed the use of AI scribe software for clinical note transcription with the patient, who gave verbal consent to proceed.  History of Present Illness Michael [name] is a 63 year old male  patient of Dr. Vicci with a history of CAD, PAD, hypertension, PE/DVT, hyperlipidemia, CVA, prediabetes  who presents with elevated blood pressure.  He reports recent elevated blood pressure after a temporary shortage of his medication. He tried to stretch his last pill and could not get a refill before Thanksgiving. He used ten pills provided by his helper until today and confirms taking his blood pressure medication this morning. A recent home nurse visit showed blood pressure 119/68.  He has prediabetes with most recent hemoglobin A1c 6.2, slightly improved from 6.3. He is working on diet changes, including cutting back on potatoes.  His medications include Zetia  and rosuvastatin  for cholesterol, lisinopril  and metoprolol  for blood pressure, tamsulosin  0.4 mg at bedtime for BPH, and Coumadin  for prior blood clots in his legs and lungs including pulmonary embolism. He stopped gabapentin  for neuropathy because of side effect concerns.  He has an upcoming urologic procedure for resection of bladder tumor next week.  He has pain in the backs of his legs when walking or playing pickleball that resolves with rest, worse in the left foot since surgery . He remains active with pickleball and caring for his mother, often starting his day early to manage her needs.    Past Medical History:  Diagnosis Date   Alcohol abuse    Anterior myocardial infarction [facility])    ST-elevation; S/P emergent  drug-eluting stenting of proximal left anterior descending   Arthritis    Coronary artery disease    a. STEMI 2012 - s/p DES to LAD with  diffuse distal disease in  the right posterolateral branch. The third OM was occluded and filled from left to left collaterals. LVEF preserved.   DVT (deep venous thrombosis) (HCC)    Dysuria 04/26/2013   Essential hypertension 05/16/2014   Frequent urination 09/03/2014   HNP (herniated nucleus pulposus), cervical 10/14/2014   Hyperlipidemia    Hypertension    Ischemia of extremity 01/23/2018   Lower abdominal pain 09/03/2014   PAD (peripheral artery disease)    Pre-diabetes    Premature atrial contraction    PSVT (paroxysmal supraventricular tachycardia)    2 short runs on monitor 06/2019   Pulmonary emboli (HCC)    PVC's (premature ventricular contractions)    Renal insufficiency    Special screening for malignant neoplasms, colon    Stroke [facility])    eye    Past Surgical History:  Procedure Laterality Date   COLONOSCOPY N/A 08/16/2014   Procedure: COLONOSCOPY;  Surgeon: Margo LITTIE Haddock, MD;  Location: [facility];  Service: Endoscopy;  Laterality: N/A;  10:15 AM   CYSTOSCOPY W/ RETROGRADES Bilateral 12/19/2023   Procedure: CYSTOSCOPY, WITH RETROGRADE PYELOGRAM;  Surgeon: Carolee Sherwood JONETTA DOUGLAS, MD;  Location: [facility] ORS;  Service: Urology;  Laterality: Bilateral;  BILATERAL RETROGRADE PYELOGRAM NEEDED   DOPPLER ECHOCARDIOGRAPHY     Preserved left venticular function   EMBOLECTOMY Right 01/23/2018   Procedure: RIGHT POPLITEAL-TIBIAL  ARTERY EMBOLECTOMY, EXPLORATION OF RIGHT ANTERIOR TIBIAL ARTERY, RIGHT POPLITEAL ARTERY VEIN ANGIOPLASTY;  Surgeon: Haddock Carlin BRAVO, MD;  Location: [facility] OR;  Service: Vascular;  Laterality: Right;  ENDARTERECTOMY POPLITEAL Left 01/23/2018   Procedure: ENDARTERECTOMY OF LEFT POPLITEAL ARTERY AND TIBIAL-PERONEAL TRUNK;  Surgeon: Harvey Carlin BRAVO, MD;  Location: [facility] OR;  Service: Vascular;  Laterality: Left;   FEMORAL-POPLITEAL BYPASS GRAFT Left 01/23/2018   Procedure: BYPASS GRAFT LEFT FEMORAL-POPLITEAL ARTERY WITH PROPATEN VASCULAR GRAFT;  Surgeon: Harvey Carlin BRAVO, MD;  Location: [facility] OR;   Service: Vascular;  Laterality: Left;   HEMATOMA EVACUATION Left 01/25/2018   Procedure: EVACUATION HEMATOMA LEFT POPLITEAL SPACE;  Surgeon: Gretta Lonni PARAS, MD;  Location: [facility] OR;  Service: Vascular;  Laterality: Left;   LEG SURGERY     left leg - has rod and 4 pins   PATELLAR TENDON REPAIR Right 04/26/2014   Procedure: RIGHT PATELLA TENDON REPAIR;  Surgeon: Evalene JONETTA Chancy, MD;  Location: [facility];  Service: Orthopedics;  Laterality: Right;    Family History  Problem Relation Age of Onset   Cancer Maternal Aunt    Hyperlipidemia Other    Diabetes Other    Heart disease Other     Social History   Socioeconomic History   Marital status: Significant Other    Spouse name: Not on file   Number of children: 0   Years of education: Not on file   Highest education level: Not on file  Occupational History   Not on file  Tobacco Use   Smoking status: Never   Smokeless tobacco: Never  Vaping Use   Vaping status: Never Used  Substance and Sexual Activity   Alcohol use: Not Currently    Comment: Drinks occassionally   Drug use: No   Sexual activity: Yes  Other Topics Concern   Not on file  Social History Narrative   Not on file   Social Drivers of Health   Financial Resource Strain: Low Risk  (03/29/2023)   Overall Financial Resource Strain (CARDIA)    Difficulty of Paying Living Expenses: Not hard at all  Food Insecurity: No Food Insecurity (03/29/2023)   Hunger Vital Sign    Worried About Running Out of Food in the Last Year: Never true    Ran Out of Food in the Last Year: Never true  Transportation Needs: No Transportation Needs (03/29/2023)   PRAPARE - Administrator, Civil Service (Medical): No    Lack of Transportation (Non-Medical): No  Physical Activity: Inactive (03/29/2023)   Exercise Vital Sign    Days of Exercise per Week: 0 days    Minutes of Exercise per Session: 0 min  Stress: No Stress Concern Present (03/29/2023)    [employer] of Occupational Health - Occupational Stress Questionnaire    Feeling of Stress : Not at all  Social Connections: Unknown (03/29/2023)   Social Connection and Isolation Panel    Frequency of Communication with Friends and Family: More than three times a week    Frequency of Social Gatherings with Friends and Family: Three times a week    Attends Religious Services: 1 to 4 times per year    Active Member of Clubs or Organizations: No    Attends Banker Meetings: Never    Marital Status: Patient declined    Allergies  Allergen Reactions   Ivp Dye [Iodinated Contrast Media] Swelling    Outpatient Medications Prior to Visit  Medication Sig Dispense Refill   acetaminophen  (TYLENOL ) 500 MG tablet Take 1,000 mg by mouth every 8 (eight) hours as needed for mild pain (pain score 1-3) or moderate pain (pain score 4-6).  aspirin  EC 81 MG tablet Take 1 tablet (81 mg total) by mouth daily. 90 tablet 3   diclofenac  Sodium (VOLTAREN ) 1 % GEL Apply 2 g topically 2 (two) times daily as needed. 100 g 1   diphenhydrAMINE  (SOMINEX) 25 MG tablet Take 25 mg by mouth at bedtime as needed for sleep.     latanoprost (XALATAN) 0.005 % ophthalmic solution Place 1 drop into the left eye at bedtime.     Multiple Vitamin (MULTIVITAMIN WITH MINERALS) TABS tablet Take 1 tablet by mouth daily.     nitroGLYCERIN  (NITROSTAT ) 0.4 MG SL tablet Place 1 tablet (0.4 mg total) under the tongue every 5 (five) minutes as needed for chest pain. 75 tablet 2   ofloxacin  (OCUFLOX ) 0.3 % ophthalmic solution Place 1 drop into the left eye 4 (four) times daily. (Patient not taking: Reported on 05/15/2024) 5 mL 0   tetrahydrozoline (REDNESS RELIEVER EYE DROPS) 0.05 % ophthalmic solution Place 1 drop into both eyes daily as needed (red eyes).     warfarin (COUMADIN ) 5 MG tablet TAKE 1 TO 2 TABLETS BY MOUTH ONCE DAILY AS DIRECTED BY  THE  COUMADIN   CLINIC (Patient taking differently: Take 5 mg by mouth See  admin instructions. Take 10 mg on Monday, Wednesday and Friday, all the other days take 5 mg in the morning DIRECTED BY  THE  COUMADIN   CLINIC) 150 tablet 0   ezetimibe  (ZETIA ) 10 MG tablet Take 1 tablet by mouth once daily 90 tablet 1   gabapentin  (NEURONTIN ) 300 MG capsule TAKE 1 CAPSULE BY MOUTH IN THE MORNING AND 2 IN THE EVENING (Patient not taking: Reported on 05/15/2024) 90 capsule 4   lisinopril  (ZESTRIL ) 40 MG tablet Take 1 tablet (40 mg total) by mouth daily. 90 tablet 0   metoprolol  tartrate (LOPRESSOR ) 25 MG tablet Take 1 tablet (25 mg total) by mouth 2 (two) times daily. 10 tablet 0   rosuvastatin  (CRESTOR ) 40 MG tablet Take 1 tablet by mouth once daily 90 tablet 0   tamsulosin  (FLOMAX ) 0.4 MG CAPS capsule Take 1 capsule (0.4 mg total) by mouth at bedtime. 90 capsule 3   Facility-Administered Medications Prior to Visit  Medication Dose Route Frequency Provider Last Rate Last Admin   gemcitabine  (GEMZAR ) chemo syringe for bladder instillation 2,000 mg  2,000 mg Bladder Instillation Once Carolee Sherwood BIRCH III, MD       gemcitabine  (GEMZAR ) chemo syringe for bladder instillation 2,000 mg  2,000 mg Bladder Instillation Once Carolee Sherwood BIRCH III, MD         ROS Review of Systems  Constitutional:  Negative for activity change and appetite change.  HENT:  Negative for sinus pressure and sore throat.   Respiratory:  Negative for chest tightness, shortness of breath and wheezing.   Cardiovascular:  Negative for chest pain and palpitations.  Gastrointestinal:  Negative for abdominal distention, abdominal pain and constipation.  Genitourinary: Negative.   Musculoskeletal:        See HPI  Psychiatric/Behavioral:  Negative for behavioral problems and dysphoric mood.     Objective:  BP (!) 162/93   Pulse 73   Temp 98.3 F (36.8 C) (Oral)   Ht 5' 9 (1.753 m)   Wt 235 lb 9.6 oz (106.9 kg)   SpO2 96%   BMI 34.79 kg/m      05/15/2024    4:35 PM 05/15/2024    3:56 PM 04/10/2024   11:20  AM  BP/Weight  Systolic BP 162 180 145  Diastolic BP 93 95 74  Wt. (Lbs)  235.6 232  BMI  34.79 kg/m2 34.26 kg/m2      Physical Exam Constitutional:      Appearance: He is well-developed.  Cardiovascular:     Rate and Rhythm: Normal rate.     Heart sounds: Normal heart sounds. No murmur heard. Pulmonary:     Effort: Pulmonary effort is normal.     Breath sounds: Normal breath sounds. No wheezing or rales.  Chest:     Chest wall: No tenderness.  Abdominal:     General: Bowel sounds are normal. There is no distension.     Palpations: Abdomen is soft. There is no mass.     Tenderness: There is no abdominal tenderness.  Musculoskeletal:        General: Normal range of motion.     Right lower leg: No edema.     Left lower leg: No edema.  Neurological:     Mental Status: He is alert and oriented to person, place, and time.  Psychiatric:        Mood and Affect: Mood normal.        Latest Ref Rng & Units 04/10/2024   11:51 AM 12/06/2023   12:08 PM 03/20/2023    6:27 PM  CMP  Glucose 70 - 99 mg/dL 873  96  94   BUN 8 - 23 mg/dL 19  23  15    Creatinine 0.61 - 1.24 mg/dL 8.88  9.01  8.75   Sodium 135 - 145 mmol/L 136  136  136   Potassium 3.5 - 5.1 mmol/L 4.1  4.0  3.8   Chloride 98 - 111 mmol/L 103  103  98   CO2 22 - 32 mmol/L 25  24  24    Calcium  8.9 - 10.3 mg/dL 9.6  9.1  9.1   Total Protein 6.5 - 8.1 g/dL  7.1    Total Bilirubin 0.0 - 1.2 mg/dL  0.6    Alkaline Phos 38 - 126 U/L  70    AST 15 - 41 U/L  26    ALT 0 - 44 U/L  39      Lipid Panel     Component Value Date/Time   CHOL 115 08/09/2023 1547   TRIG 63 08/09/2023 1547   HDL 40 08/09/2023 1547   CHOLHDL 2.9 08/09/2023 1547   CHOLHDL 3.6 08/11/2016 1030   VLDL 19 08/11/2016 1030   LDLCALC 61 08/09/2023 1547   LDLDIRECT 164.1 05/04/2013 1107    CBC    Component Value Date/Time   WBC 4.7 04/10/2024 1151   RBC 5.10 04/10/2024 1151   HGB 13.7 04/10/2024 1151   HGB 14.0 12/27/2022 1143   HCT 44.6  04/10/2024 1151   HCT 43.4 12/27/2022 1143   PLT 186 04/10/2024 1151   PLT 202 12/27/2022 1143   MCV 87.5 04/10/2024 1151   MCV 83 12/27/2022 1143   MCH 26.9 04/10/2024 1151   MCHC 30.7 04/10/2024 1151   RDW 13.5 04/10/2024 1151   RDW 13.3 12/27/2022 1143   LYMPHSABS 1.8 12/01/2018 1227   MONOABS 0.6 12/01/2018 1227   EOSABS 0.1 12/01/2018 1227   BASOSABS 0.0 12/01/2018 1227    Lab Results  Component Value Date   HGBA1C 6.2 05/15/2024    Lab Results  Component Value Date   HGBA1C 6.2 05/15/2024   HGBA1C 6.3 (H) 08/09/2023   HGBA1C 6.4 12/27/2022       Assessment & Plan Essential hypertension  Blood pressure elevated, likely due to inconsistent medication adherence.  Repeat blood pressure still elevated but patient states blood pressure was controlled at visit by the [employer] nurse - Advised consistent medication adherence. - Follow up in one month with PCP to recheck blood pressure. - Advised sodium diet modification.  Peripheral artery disease with claudication Leg pain during activity, resolves with rest. Discontinued gabapentin  due to side effects. -Secondary risk factor modification including blood pressure control, maintaining LDL at goal of less than 70 - Continue statin - Respected decision to discontinue gabapentin . - Currently under vascular care  Prediabetes Hemoglobin A1c decreased from 6.3% to 6.2%. Dietary changes contributing to improvement. - Continue current dietary modifications.  History of venous thromboembolism on anticoagulation On warfarin for anticoagulation. Regular INR monitoring at Coumadin  clinic. - Continue warfarin as directed by Coumadin  clinic.  Benign prostatic hyperplasia Taking tamsulosin  for management. - Continue tamsulosin  0.4 mg at bedtime. - Follow up with urologist on December 12th for further bladder procedure for resection of bladder tumor  General Health Maintenance Received flu and pneumonia vaccinations at  [facility].     Meds ordered this encounter  Medications   lisinopril  (ZESTRIL ) 40 MG tablet    Sig: Take 1 tablet (40 mg total) by mouth daily.    Dispense:  90 tablet    Refill:  1   metoprolol  tartrate (LOPRESSOR ) 25 MG tablet    Sig: Take 1 tablet (25 mg total) by mouth 2 (two) times daily.    Dispense:  180 tablet    Refill:  1   rosuvastatin  (CRESTOR ) 40 MG tablet    Sig: Take 1 tablet (40 mg total) by mouth daily.    Dispense:  90 tablet    Refill:  1   ezetimibe  (ZETIA ) 10 MG tablet    Sig: Take 1 tablet (10 mg total) by mouth daily.    Dispense:  90 tablet    Refill:  1   tamsulosin  (FLOMAX ) 0.4 MG CAPS capsule    Sig: Take 1 capsule (0.4 mg total) by mouth at bedtime.    Dispense:  90 capsule    Refill:  1    Follow-up: Return in about 1 month (around 06/15/2024) for Blood Pressure follow-up with PCP.       Corrina Sabin, MD, FAAFP. [facility] [place], [place] [phone]   05/15/2024, 6:17 PM

## 2024-05-15 NOTE — Patient Instructions (Signed)
 Managing Your Hypertension Hypertension, also called high blood pressure, is when the force of the blood pressing against the walls of the arteries is too strong. Arteries are blood vessels that carry blood from your heart throughout your body. Hypertension forces the heart to work harder to pump blood and may cause the arteries to become narrow or stiff. Understanding blood pressure readings A blood pressure reading includes a higher number over a lower number: The first, or top, number is called the systolic pressure. It is a measure of the pressure in your arteries as your heart beats. The second, or bottom number, is called the diastolic pressure. It is a measure of the pressure in your arteries as the heart relaxes. For most people, a normal blood pressure is below 120/80. Your personal target blood pressure may vary depending on your medical conditions, your age, and other factors. Blood pressure is classified into four stages. Based on your blood pressure reading, your health care provider may use the following stages to determine what type of treatment you need, if any. Systolic pressure and diastolic pressure are measured in a unit called millimeters of mercury (mmHg). Normal Systolic pressure: below 120. Diastolic pressure: below 80. Elevated Systolic pressure: 120-129. Diastolic pressure: below 80. Hypertension stage 1 Systolic pressure: 130-139. Diastolic pressure: 80-89. Hypertension stage 2 Systolic pressure: 140 or above. Diastolic pressure: 90 or above. How can this condition affect me? Managing your hypertension is very important. Over time, hypertension can damage the arteries and decrease blood flow to parts of the body, including the brain, heart, and kidneys. Having untreated or uncontrolled hypertension can lead to: A heart attack. A stroke. A weakened blood vessel (aneurysm). Heart failure. Kidney damage. Eye damage. Memory and concentration problems. Vascular  dementia. What actions can I take to manage this condition? Hypertension can be managed by making lifestyle changes and possibly by taking medicines. Your health care provider will help you make a plan to bring your blood pressure within a normal range. You may be referred for counseling on a healthy diet and physical activity. Nutrition  Eat a diet that is high in fiber and potassium, and low in salt (sodium), added sugar, and fat. An example eating plan is called the DASH diet. DASH stands for Dietary Approaches to Stop Hypertension. To eat this way: Eat plenty of fresh fruits and vegetables. Try to fill one-half of your plate at each meal with fruits and vegetables. Eat whole grains, such as whole-wheat pasta, brown rice, or whole-grain bread. Fill about one-fourth of your plate with whole grains. Eat low-fat dairy products. Avoid fatty cuts of meat, processed or cured meats, and poultry with skin. Fill about one-fourth of your plate with lean proteins such as fish, chicken without skin, beans, eggs, and tofu. Avoid pre-made and processed foods. These tend to be higher in sodium, added sugar, and fat. Reduce your daily sodium intake. Many people with hypertension should eat less than 1,500 mg of sodium a day. Lifestyle  Work with your health care provider to maintain a healthy body weight or to lose weight. Ask what an ideal weight is for you. Get at least 30 minutes of exercise that causes your heart to beat faster (aerobic exercise) most days of the week. Activities may include walking, swimming, or biking. Include exercise to strengthen your muscles (resistance exercise), such as weight lifting, as part of your weekly exercise routine. Try to do these types of exercises for 30 minutes at least 3 days a week. Do  not use any products that contain nicotine  or tobacco. These products include cigarettes, chewing tobacco, and vaping devices, such as e-cigarettes. If you need help quitting, ask your  health care provider. Control any long-term (chronic) conditions you have, such as high cholesterol or diabetes. Identify your sources of stress and find ways to manage stress. This may include meditation, deep breathing, or making time for fun activities. Alcohol use Do not drink alcohol if: Your health care provider tells you not to drink. You are pregnant, may be pregnant, or are planning to become pregnant. If you drink alcohol: Limit how much you have to: 0-1 drink a day for women. 0-2 drinks a day for men. Know how much alcohol is in your drink. In the [place] one drink equals one 12 oz bottle of beer (355 mL), one 5 oz glass of wine (148 mL), or one 1 oz glass of hard liquor (44 mL). Medicines Your health care provider may prescribe medicine if lifestyle changes are not enough to get your blood pressure under control and if: Your systolic blood pressure is 130 or higher. Your diastolic blood pressure is 80 or higher. Take medicines only as told by your health care provider. Follow the directions carefully. Blood pressure medicines must be taken as told by your health care provider. The medicine does not work as well when you skip doses. Skipping doses also puts you at risk for problems. Monitoring Before you monitor your blood pressure: Do not smoke, drink caffeinated beverages, or exercise within 30 minutes before taking a measurement. Use the bathroom and empty your bladder (urinate). Sit quietly for at least 5 minutes before taking measurements. Monitor your blood pressure at home as told by your health care provider. To do this: Sit with your back straight and supported. Place your feet flat on the floor. Do not cross your legs. Support your arm on a flat surface, such as a table. Make sure your upper arm is at heart level. Each time you measure, take two or three readings one minute apart and record the results. You may also need to have your blood pressure checked regularly by  your health care provider. General information Talk with your health care provider about your diet, exercise habits, and other lifestyle factors that may be contributing to hypertension. Review all the medicines you take with your health care provider because there may be side effects or interactions. Keep all follow-up visits. Your health care provider can help you create and adjust your plan for managing your high blood pressure. Where to find more information National Heart, Lung, and Blood Institute: [employer].is American Heart Association: www.heart.org Contact a health care provider if: You think you are having a reaction to medicines you have taken. You have repeated (recurrent) headaches. You feel dizzy. You have swelling in your ankles. You have trouble with your vision. Get help right away if: You develop a severe headache or confusion. You have unusual weakness or numbness, or you feel faint. You have severe pain in your chest or abdomen. You vomit repeatedly. You have trouble breathing. These symptoms may be an emergency. Get help right away. Call 911. Do not wait to see if the symptoms will go away. Do not drive yourself to the hospital. Summary Hypertension is when the force of blood pumping through your arteries is too strong. If this condition is not controlled, it may put you at risk for serious complications. Your personal target blood pressure may vary depending on your medical conditions,  your age, and other factors. For most people, a normal blood pressure is less than 120/80. Hypertension is managed by lifestyle changes, medicines, or both. Lifestyle changes to help manage hypertension include losing weight, eating a healthy, low-sodium diet, exercising more, stopping smoking, and limiting alcohol. This information is not intended to replace advice given to you by your health care provider. Make sure you discuss any questions you have with your health care  provider. Document Revised: 02/12/2021 Document Reviewed: 02/12/2021 Elsevier Patient Education  2024 [employer].

## 2024-05-16 DIAGNOSIS — N390 Urinary tract infection, site not specified: Secondary | ICD-10-CM | POA: Diagnosis not present

## 2024-05-16 DIAGNOSIS — C673 Malignant neoplasm of anterior wall of bladder: Secondary | ICD-10-CM | POA: Diagnosis not present

## 2024-05-17 NOTE — Patient Instructions (Signed)
 SURGICAL WAITING ROOM VISITATION  Patients having surgery or a procedure may have no more than 2 support people in the waiting area - these visitors may rotate.    Children under the age of 75 must have an adult with them who is not the patient.  Visitors with respiratory illnesses are discouraged from visiting and should remain at home.  If the patient needs to stay at the hospital during part of their recovery, the visitor guidelines for inpatient rooms apply. Pre-op nurse will coordinate an appropriate time for 1 support person to accompany patient in pre-op.  This support person may not rotate.    Please refer to the [facility] website for the visitor guidelines for Inpatients (after your surgery is over and you are in a regular room).       Your procedure is scheduled on: 05/25/24   Report to [facility] Main Entrance    Report to admitting at 1:15 PM    Call this number if you have problems the morning of surgery [phone]   Do not eat food or drink any liquids:After Midnight.but may have sips of water  to take meds.    Oral Hygiene is also important to reduce your risk of infection.                                    Remember - BRUSH YOUR TEETH THE MORNING OF SURGERY WITH YOUR REGULAR TOOTHPASTE    Stop all vitamins and herbal supplements 7 days before surgery.   Take these medicines the morning of surgery with A SIP OF WATER : ezetimibe (zetia ), metoprolol , rosuvastatin , tamsulosin , tylenol  if needed.              Do not take Lisinopril  the morning of surgery.             You may not have any metal on your body including hair pins, jewelry, and body piercing             Do not wear make-up, lotions, powders, perfumes/cologne, or deodorant              Men may shave face and neck.   Do not bring valuables to the hospital. [facility] IS NOT             RESPONSIBLE   FOR VALUABLES.   Contacts, glasses, dentures or bridgework may not be worn into  surgery.   DO NOT BRING YOUR HOME MEDICATIONS TO THE HOSPITAL. PHARMACY WILL DISPENSE MEDICATIONS LISTED ON YOUR MEDICATION LIST TO YOU DURING YOUR ADMISSION IN THE HOSPITAL!    Patients discharged on the day of surgery will not be allowed to drive home.  Someone NEEDS to stay with you for the first 24 hours after anesthesia.   Special Instructions: Bring a copy of your healthcare power of attorney and living will documents the day of surgery if you haven't scanned them before.              Please read over the following fact sheets you were given: IF YOU HAVE QUESTIONS ABOUT YOUR PRE-OP INSTRUCTIONS PLEASE CALL [phone] Michael [name]   If you received a COVID test during your pre-op visit  it is requested that you wear a mask when out in public, stay away from anyone that may not be feeling well and notify your surgeon if you develop symptoms. If you test positive for Covid or have been  in contact with anyone that has tested positive in the last 10 days please notify you surgeon.    [facility] - Preparing for Surgery Before surgery, you can play an important role.  Because skin is not sterile, your skin needs to be as free of germs as possible.  You can reduce the number of germs on your skin by washing with CHG (chlorahexidine gluconate) soap before surgery.  CHG is an antiseptic cleaner which kills germs and bonds with the skin to continue killing germs even after washing. Please DO NOT use if you have an allergy to CHG or antibacterial soaps.  If your skin becomes reddened/irritated stop using the CHG and inform your nurse when you arrive at Short Stay. Do not shave (including legs and underarms) for at least 48 hours prior to the first CHG shower.  You may shave your face/neck.  Please follow these instructions carefully:  1.  Shower with CHG Soap the night before surgery and the morning of surgery.  2.  If you choose to wash your hair, wash your hair first as usual with your normal   shampoo.  3.  After you shampoo, rinse your hair and body thoroughly to remove the shampoo.                             4.  Use CHG as you would any other liquid soap.  You can apply chg directly to the skin and wash.  Gently with a scrungie or clean washcloth.  5.  Apply the CHG Soap to your body ONLY FROM THE NECK DOWN.   Do   not use on face/ open                           Wound or open sores. Avoid contact with eyes, ears mouth and   genitals (private parts).                       Wash face,  Genitals (private parts) with your normal soap.             6.  Wash thoroughly, paying special attention to the area where your    surgery  will be performed.  7.  Thoroughly rinse your body with warm water  from the neck down.  8.  DO NOT shower/wash with your normal soap after using and rinsing off the CHG Soap.                9.  Pat yourself dry with a clean towel.            10.  Wear clean pajamas.            11.  Place clean sheets on your bed the night of your first shower and do not  sleep with pets. Day of Surgery : Do not apply any CHG, lotions/deodorants the morning of surgery.  Please wear clean clothes to the hospital/surgery center.  FAILURE TO FOLLOW THESE INSTRUCTIONS MAY RESULT IN THE CANCELLATION OF YOUR SURGERY  PATIENT SIGNATURE_________________________________  NURSE SIGNATURE__________________________________  ________________________________________________________________________

## 2024-05-17 NOTE — Progress Notes (Addendum)
 COVID Vaccine received:  []  No [x]  Yes Date of any COVID positive Test in last 90 days: no PCP - Barnie Louder MD Cardiologist - Medford Pine MD  Chest x-ray -  EKG -  11/23/23 Epic Stress Test -  ECHO - 11/10/21 epic Cardiac Cath - yes 2012  Card[facility] -[facility] 04/10/[facility]  Bowel Prep - [x]  No  []   Yes ______  Pacemaker / ICD device [x]  No []  Yes   Spinal Cord Stimulator:[x]  No []  Yes       History of Sleep Apnea? [x]  No []  Yes   CPAP used?- [x]  No []  Yes    Does the patient monitor blood sugar?          [x]  No []  Yes  []  N/A  Patient has: [x]  NO Hx DM   []  Pre-DM                 []  DM1  []   DM2 Does patient have a [employer] or Dexacom? []  No []  Yes   Fasting Blood Sugar Ranges-  Checks Blood Sugar _____ times a day  GLP1 agonist / usual dose - no GLP1 instructions:  SGLT-2 inhibitors / usual dose - no SGLT-2 instructions:   Blood Thinner / Instructions:Warfarin Hold 5 days. Last dose to be 05/19/24 Aspirin  Instructions:81 mg- Hold 5 days prior to surgery. Last dose to be 05/19/24  Comments:   Activity level: Patient is able  to climb a flight of stairs without difficulty; [x]  No CP  [x]  No SOB,  Patient can perform ADLs without assistance.   Anesthesia review: HTN, CAD, MI 2012, stent, DVT, prediabetic, stroke  Patient denies shortness of breath, fever, cough and chest pain at PAT appointment.  Patient verbalized understanding and agreement to the Pre-Surgical Instructions that were given to them at this PAT appointment. Patient was also educated of the need to review these PAT instructions again prior to his/her surgery.I reviewed the appropriate phone numbers to call if they have any and questions or concerns.

## 2024-05-18 ENCOUNTER — Encounter (HOSPITAL_COMMUNITY): Payer: Self-pay

## 2024-05-18 ENCOUNTER — Encounter (HOSPITAL_COMMUNITY)
Admission: RE | Admit: 2024-05-18 | Discharge: 2024-05-18 | Disposition: A | Source: Ambulatory Visit | Attending: Urology

## 2024-05-18 ENCOUNTER — Other Ambulatory Visit: Payer: Self-pay

## 2024-05-18 VITALS — BP 169/91 | HR 59 | Temp 98.3°F | Resp 18 | Ht 69.0 in | Wt 231.0 lb

## 2024-05-18 DIAGNOSIS — Z01812 Encounter for preprocedural laboratory examination: Secondary | ICD-10-CM | POA: Diagnosis not present

## 2024-05-18 DIAGNOSIS — I252 Old myocardial infarction: Secondary | ICD-10-CM | POA: Diagnosis not present

## 2024-05-18 DIAGNOSIS — I10 Essential (primary) hypertension: Secondary | ICD-10-CM

## 2024-05-18 DIAGNOSIS — I110 Hypertensive heart disease with heart failure: Secondary | ICD-10-CM | POA: Diagnosis not present

## 2024-05-18 DIAGNOSIS — I5020 Unspecified systolic (congestive) heart failure: Secondary | ICD-10-CM | POA: Diagnosis not present

## 2024-05-18 DIAGNOSIS — C674 Malignant neoplasm of posterior wall of bladder: Secondary | ICD-10-CM | POA: Diagnosis not present

## 2024-05-18 DIAGNOSIS — Z86711 Personal history of pulmonary embolism: Secondary | ICD-10-CM | POA: Diagnosis not present

## 2024-05-18 DIAGNOSIS — I2510 Atherosclerotic heart disease of native coronary artery without angina pectoris: Secondary | ICD-10-CM | POA: Diagnosis not present

## 2024-05-18 DIAGNOSIS — Z86718 Personal history of other venous thrombosis and embolism: Secondary | ICD-10-CM | POA: Diagnosis not present

## 2024-05-18 DIAGNOSIS — Z01818 Encounter for other preprocedural examination: Secondary | ICD-10-CM

## 2024-05-18 DIAGNOSIS — Z955 Presence of coronary angioplasty implant and graft: Secondary | ICD-10-CM | POA: Diagnosis not present

## 2024-05-18 HISTORY — DX: Malignant (primary) neoplasm, unspecified: C80.1

## 2024-05-18 LAB — CBC
HCT: 46.3 % (ref 39.0–52.0)
Hemoglobin: 14.1 g/dL (ref 13.0–17.0)
MCH: 26.9 pg (ref 26.0–34.0)
MCHC: 30.5 g/dL (ref 30.0–36.0)
MCV: 88.4 fL (ref 80.0–100.0)
Platelets: 170 K/uL (ref 150–400)
RBC: 5.24 MIL/uL (ref 4.22–5.81)
RDW: 13.7 % (ref 11.5–15.5)
WBC: 5 K/uL (ref 4.0–10.5)
nRBC: 0 % (ref 0.0–0.2)

## 2024-05-18 LAB — BASIC METABOLIC PANEL WITH GFR
Anion gap: 8 (ref 5–15)
BUN: 22 mg/dL (ref 8–23)
CO2: 26 mmol/L (ref 22–32)
Calcium: 9.4 mg/dL (ref 8.9–10.3)
Chloride: 105 mmol/L (ref 98–111)
Creatinine, Ser: 1.11 mg/dL (ref 0.61–1.24)
GFR, Estimated: >60 mL/min (ref >60)
Glucose, Bld: 94 mg/dL (ref 70–99)
Potassium: 4.6 mmol/L (ref 3.5–5.1)
Sodium: 139 mmol/L (ref 135–145)

## 2024-05-21 NOTE — Anesthesia Preprocedure Evaluation (Signed)
 Anesthesia Evaluation    Airway        Dental   Pulmonary           Cardiovascular hypertension,      Neuro/Psych    GI/Hepatic   Endo/Other    Renal/GU      Musculoskeletal   Abdominal   Peds  Hematology   Anesthesia Other Findings   Reproductive/Obstetrics                              Anesthesia Physical Anesthesia Plan  ASA:   Anesthesia Plan:    Post-op Pain Management:    Induction:   PONV Risk Score and Plan:   Airway Management Planned:   Additional Equipment:   Intra-op Plan:   Post-operative Plan:   Informed Consent:   Plan Discussed with:   Anesthesia Plan Comments: (See PAT note from 12/5 )         Anesthesia Quick Evaluation

## 2024-05-21 NOTE — Progress Notes (Signed)
 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 Also has hx of DVT and PE and is on Coumadin . He was last seen in clinic on 6/11 for pre op clearance prior to TURBT in July and was cleared. He had another cardiac clearance visit on 10/28 and was cleared. He is active and plays pickleball:   Preoperative Cardiovascular Risk Assessment: According to the Revised Cardiac Risk Index (RCRI), his Perioperative Risk of Major Cardiac Event is (%): 0.9. His Functional Capacity in METs is: 7.93 according to the Duke Activity Status Index (DASI). Therefore, based on ACC/AHA guidelines, patient would be at acceptable risk for the planned procedure without further cardiovascular testing. I will route this recommendation to the requesting party via Epic fax function.   Patient follows with Vascular for hx of PAD. He had a right popliteal and tibial embolectomy in August 2019.  He also underwent left femoral to below the knee popliteal bypass with PTFE at the same time. Last seen in clinic on 04/21/23 for surveillance. Bypass is known to be occluded but has no symptoms and was advised to continue current management and repeat ABIs in a year.  Seen by PCP on 05/15/24 for high BP due to running out of medication.  Advised follow low Na diet and f/u in 1 month. BP still elevated at PAT visit.   LD Warfarin: 12/6. Last INR was 2.7 on 11/20  VS: BP (!) 169/91   Pulse (!) 59   Temp 36.8 C (Oral)   Resp 18   Ht 5' 9 (1.753 m)   Wt 104.8 kg   SpO2 100%   BMI 34.11 kg/m   PROVIDERS: Vicci Barnie NOVAK, MD   LABS: Labs reviewed: Acceptable for surgery. INR ordered for DOS (all labs ordered are listed, but only abnormal results are displayed)  Labs Reviewed  BASIC METABOLIC PANEL WITH GFR  CBC    EKG 11/23/23:   Sinus rhythm with 1st degree A-V block with Premature atrial complexes with Abberant conduction, rate 66 Left axis deviation Incomplete right bundle branch block Cannot rule out Anterior infarct , age undetermined When compared with ECG of 20-Mar-2023 18:10, Fusion complexes are no longer Present Premature ventricular complexes are no longer Present Abberant conduction is now Present PR interval has increased Incomplete right bundle branch block is now Present   CV:   Echo 11/10/21:   IMPRESSIONS      1. Global hypokinesis with mid-apical anterolateral and apical anterior akinesis. Left ventricular ejection fraction, by estimation, is 45 to 50%. The left ventricle has mildly decreased function. The left ventricle demonstrates global hypokinesis. The left ventricular internal cavity size was mildly dilated. There is mild concentric left ventricular hypertrophy. Left ventricular diastolic  parameters are consistent with Grade II diastolic dysfunction (pseudonormalization).  2. Right ventricular systolic function is normal. The right ventricular size is normal.  3. Left atrial size was severely dilated.  4. The mitral valve is normal in structure. Mild mitral valve regurgitation. No evidence of mitral stenosis.  5. The aortic valve is tricuspid. Aortic valve regurgitation is not visualized. No aortic stenosis is present.  6. Aortic dilatation noted. There is mild  dilatation of the aortic root and of the ascending aorta, measuring 36 mm.  7. The inferior vena cava is normal in size with greater than 50% respiratory variability, suggesting right atrial pressure of 3 mmHg.    Past Medical History:  Diagnosis Date   Alcohol abuse    Anterior myocardial infarction [facility])    ST-elevation; S/P emergent  drug-eluting stenting of proximal left anterior descending   Arthritis    Cancer (HCC)    Coronary artery disease    a. STEMI 2012 - s/p DES to LAD with  diffuse distal disease in the right posterolateral branch. The third OM was occluded and filled from left to left collaterals. LVEF preserved.   DVT (deep venous thrombosis) (HCC)    Dysuria 04/26/2013   Essential hypertension 05/16/2014   Frequent urination 09/03/2014   HNP (herniated nucleus pulposus), cervical 10/14/2014   Hyperlipidemia    Hypertension    Ischemia of extremity 01/23/2018   Lower abdominal pain 09/03/2014   PAD (peripheral artery disease)    Pre-diabetes    Premature atrial contraction    PSVT (paroxysmal supraventricular tachycardia)    2 short runs on monitor 06/2019   Pulmonary emboli (HCC)    PVC's (premature ventricular contractions)    Renal insufficiency    Special screening for malignant neoplasms, colon    Stroke [facility])    eye    Past Surgical History:  Procedure Laterality Date   COLONOSCOPY N/A 08/16/2014   Procedure: COLONOSCOPY;  Surgeon: Margo LITTIE Haddock, MD;  Location[facility];  Service: Endoscopy;  Laterality: N/A;  10:15 AM   CYSTOSCOPY W/ RETROGRADES Bilateral 12/19/2023   Procedure: CYSTOSCOPY, WITH RETROGRADE PYELOGRAM;  Surgeon: Carolee Sherwood JONETTA DOUGLAS, MD;  Location: [facility];  Service: Urology;  Laterality: Bilateral;  BILATERAL RETROGRADE PYELOGRAM NEEDED   DOPPLER ECHOCARDIOGRAPHY     Preserved left venticular function   EMBOLECTOMY Right 01/23/2018   Procedure: RIGHT POPLITEAL-TIBIAL  ARTERY EMBOLECTOMY, EXPLORATION OF RIGHT ANTERIOR TIBIAL ARTERY,  RIGHT POPLITEAL ARTERY VEIN ANGIOPLASTY;  Surgeon: Haddock Carlin BRAVO, MD;  Location: [facility] OR;  Service: Vascular;  Laterality: Right;   ENDARTERECTOMY POPLITEAL Left 01/23/2018   Procedure: ENDARTERECTOMY OF LEFT POPLITEAL ARTERY AND TIBIAL-PERONEAL TRUNK;  Surgeon: Haddock Carlin BRAVO, MD;  Location: [facility] OR;  Service: Vascular;  Laterality: Left;   FEMORAL-POPLITEAL BYPASS GRAFT Left 01/23/2018   Procedure: BYPASS GRAFT LEFT FEMORAL-POPLITEAL ARTERY WITH PROPATEN VASCULAR GRAFT;  Surgeon: Haddock Carlin BRAVO, MD;  Location: [facility] OR;  Service: Vascular;  Laterality: Left;   HEMATOMA EVACUATION Left 01/25/2018   Procedure: EVACUATION HEMATOMA LEFT POPLITEAL SPACE;  Surgeon: Gretta Lonni PARAS, MD;  Location: [facility] OR;  Service: Vascular;  Laterality: Left;   LEG SURGERY     left leg - has rod and 4 pins   PATELLAR TENDON REPAIR Right 04/26/2014   Procedure: RIGHT PATELLA TENDON REPAIR;  Surgeon: Evalene JONETTA Chancy, MD;  Location: [facility];  Service: Orthopedics;  Laterality: Right;    MEDICATIONS:  acetaminophen  (TYLENOL ) 500 MG tablet   aspirin  EC  81 MG tablet   diclofenac  Sodium (VOLTAREN ) 1 % GEL   diphenhydrAMINE  (SOMINEX) 25 MG tablet   ezetimibe  (ZETIA ) 10 MG tablet   latanoprost (XALATAN) 0.005 % ophthalmic solution   lisinopril  (ZESTRIL ) 40 MG tablet   metoprolol  tartrate (LOPRESSOR ) 25 MG tablet   Multiple Vitamin (MULTIVITAMIN WITH MINERALS) TABS tablet   nitroGLYCERIN  (NITROSTAT ) 0.4 MG SL tablet   ofloxacin  (OCUFLOX ) 0.3 % ophthalmic solution   rosuvastatin  (CRESTOR ) 40 MG tablet   tamsulosin  (FLOMAX ) 0.4 MG CAPS capsule   tetrahydrozoline (REDNESS RELIEVER EYE DROPS) 0.05 % ophthalmic solution   warfarin (COUMADIN ) 5 MG tablet   No current facility-administered medications for this encounter.    gemcitabine  (GEMZAR ) chemo syringe for bladder instillation 2,000 mg   gemcitabine  (GEMZAR ) chemo syringe for bladder instillation 2,000 mg    Danecia Underdown M Johniece Hornbaker, PA-C [facility]  Surgical Short Stay/Anesthesiology [facility] Phone [phone] 05/21/2024 11:40 AM

## 2024-05-25 ENCOUNTER — Encounter (HOSPITAL_COMMUNITY): Payer: Self-pay | Admitting: Medical

## 2024-05-25 ENCOUNTER — Ambulatory Visit (HOSPITAL_COMMUNITY): Payer: Self-pay | Admitting: Medical

## 2024-05-25 ENCOUNTER — Encounter (HOSPITAL_COMMUNITY): Admission: RE | Disposition: A | Payer: Self-pay | Source: Home / Self Care | Attending: Urology

## 2024-05-25 ENCOUNTER — Ambulatory Visit (HOSPITAL_COMMUNITY)
Admission: RE | Admit: 2024-05-25 | Discharge: 2024-05-25 | Disposition: A | Discharge status: Home / Self Care | Attending: Urology | Admitting: Urology

## 2024-05-25 ENCOUNTER — Encounter (HOSPITAL_COMMUNITY): Payer: Self-pay | Admitting: Urology

## 2024-05-25 DIAGNOSIS — I739 Peripheral vascular disease, unspecified: Secondary | ICD-10-CM | POA: Diagnosis not present

## 2024-05-25 DIAGNOSIS — Z86718 Personal history of other venous thrombosis and embolism: Secondary | ICD-10-CM | POA: Diagnosis not present

## 2024-05-25 DIAGNOSIS — I252 Old myocardial infarction: Secondary | ICD-10-CM | POA: Diagnosis not present

## 2024-05-25 DIAGNOSIS — I129 Hypertensive chronic kidney disease with stage 1 through stage 4 chronic kidney disease, or unspecified chronic kidney disease: Secondary | ICD-10-CM | POA: Diagnosis not present

## 2024-05-25 DIAGNOSIS — Z7901 Long term (current) use of anticoagulants: Secondary | ICD-10-CM

## 2024-05-25 DIAGNOSIS — I10 Essential (primary) hypertension: Secondary | ICD-10-CM | POA: Diagnosis not present

## 2024-05-25 DIAGNOSIS — Z955 Presence of coronary angioplasty implant and graft: Secondary | ICD-10-CM | POA: Diagnosis not present

## 2024-05-25 DIAGNOSIS — I2510 Atherosclerotic heart disease of native coronary artery without angina pectoris: Secondary | ICD-10-CM | POA: Diagnosis not present

## 2024-05-25 DIAGNOSIS — D494 Neoplasm of unspecified behavior of bladder: Secondary | ICD-10-CM | POA: Diagnosis not present

## 2024-05-25 DIAGNOSIS — C674 Malignant neoplasm of posterior wall of bladder: Secondary | ICD-10-CM | POA: Diagnosis present

## 2024-05-25 DIAGNOSIS — Z8673 Personal history of transient ischemic attack (TIA), and cerebral infarction without residual deficits: Secondary | ICD-10-CM | POA: Diagnosis not present

## 2024-05-25 DIAGNOSIS — N182 Chronic kidney disease, stage 2 (mild): Secondary | ICD-10-CM | POA: Diagnosis not present

## 2024-05-25 LAB — PROTIME-INR
INR: 1 (ref 0.8–1.2)
Prothrombin Time: 13.8 s (ref 11.4–15.2)

## 2024-05-25 SURGERY — TURBT, WITH CHEMOTHERAPEUTIC AGENT INSTILLATION INTO BLADDER
Anesthesia: General

## 2024-05-25 MED ORDER — PROPOFOL 10 MG/ML IV BOLUS
INTRAVENOUS | Status: DC | PRN
  Administered 2024-05-25: 200 mg via INTRAVENOUS

## 2024-05-25 MED ORDER — SUGAMMADEX SODIUM 200 MG/2ML IV SOLN
INTRAVENOUS | Status: DC | PRN
  Administered 2024-05-25: 200 mg via INTRAVENOUS

## 2024-05-25 MED ORDER — ROCURONIUM BROMIDE 10 MG/ML (PF) SYRINGE
PREFILLED_SYRINGE | INTRAVENOUS | Status: AC
  Filled 2024-05-25: qty 10

## 2024-05-25 MED ORDER — ORAL CARE MOUTH RINSE: 15.0000 mL | Freq: Once | OROMUCOSAL | Status: AC

## 2024-05-25 MED ORDER — EPHEDRINE 5 MG/ML INJ
INTRAVENOUS | Status: AC
  Filled 2024-05-25: qty 5

## 2024-05-25 MED ORDER — OXYCODONE HCL 5 MG/5ML PO SOLN: 5.0000 mg | Freq: Once | ORAL | Status: DC | PRN

## 2024-05-25 MED ORDER — FENTANYL CITRATE (PF) 100 MCG/2ML IJ SOLN
INTRAMUSCULAR | Status: DC | PRN
  Administered 2024-05-25 (×2): 50 ug via INTRAVENOUS

## 2024-05-25 MED ORDER — OXYCODONE HCL 5 MG PO TABS: 5.0000 mg | ORAL_TABLET | Freq: Once | ORAL | Status: DC | PRN

## 2024-05-25 MED ORDER — SUGAMMADEX SODIUM 200 MG/2ML IV SOLN
INTRAVENOUS | Status: AC
  Filled 2024-05-25: qty 2

## 2024-05-25 MED ORDER — ROCURONIUM BROMIDE 100 MG/10ML IV SOLN
INTRAVENOUS | Status: DC | PRN
  Administered 2024-05-25: 70 mg via INTRAVENOUS

## 2024-05-25 MED ORDER — ACETAMINOPHEN 500 MG PO TABS
1000.0000 mg | ORAL_TABLET | Freq: Once | ORAL | Status: AC
  Administered 2024-05-25: 1000 mg via ORAL
  Filled 2024-05-25: qty 2

## 2024-05-25 MED ORDER — CEFAZOLIN SODIUM-DEXTROSE 2-4 GM/100ML-% IV SOLN
2.0000 g | INTRAVENOUS | Status: AC
  Administered 2024-05-25: 2 g via INTRAVENOUS
  Filled 2024-05-25: qty 100

## 2024-05-25 MED ORDER — CELECOXIB 200 MG PO CAPS
200.0000 mg | ORAL_CAPSULE | Freq: Once | ORAL | Status: AC
  Administered 2024-05-25: 200 mg via ORAL
  Filled 2024-05-25: qty 1

## 2024-05-25 MED ORDER — PHENYLEPHRINE HCL-NACL 20-0.9 MG/250ML-% IV SOLN
INTRAVENOUS | Status: DC | PRN
  Administered 2024-05-25: 50 ug/min via INTRAVENOUS

## 2024-05-25 MED ORDER — ACETAMINOPHEN 500 MG PO TABS: 1000.0000 mg | ORAL_TABLET | Freq: Once | ORAL | Status: AC

## 2024-05-25 MED ORDER — LACTATED RINGERS IV SOLN: INTRAVENOUS | Status: DC

## 2024-05-25 MED ORDER — ONDANSETRON HCL 4 MG/2ML IJ SOLN
INTRAMUSCULAR | Status: AC
  Filled 2024-05-25: qty 2

## 2024-05-25 MED ORDER — SODIUM CHLORIDE 0.9 % IR SOLN
Status: DC | PRN
  Administered 2024-05-25: 9000 mL via INTRAVESICAL

## 2024-05-25 MED ORDER — CHLORHEXIDINE GLUCONATE 0.12 % MT SOLN
15.0000 mL | Freq: Once | OROMUCOSAL | Status: AC
  Administered 2024-05-25: 15 mL via OROMUCOSAL

## 2024-05-25 MED ORDER — PHENYLEPHRINE 80 MCG/ML (10ML) SYRINGE FOR IV PUSH (FOR BLOOD PRESSURE SUPPORT)
PREFILLED_SYRINGE | INTRAVENOUS | Status: AC
  Filled 2024-05-25: qty 10

## 2024-05-25 MED ORDER — PHENYLEPHRINE HCL (PRESSORS) 10 MG/ML IV SOLN
INTRAVENOUS | Status: AC
  Filled 2024-05-25: qty 1

## 2024-05-25 MED ORDER — LIDOCAINE HCL (PF) 2 % IJ SOLN
INTRAMUSCULAR | Status: AC
  Filled 2024-05-25: qty 5

## 2024-05-25 MED ORDER — PROPOFOL 10 MG/ML IV BOLUS
INTRAVENOUS | Status: AC
  Filled 2024-05-25: qty 20

## 2024-05-25 MED ORDER — MIDAZOLAM HCL 2 MG/2ML IJ SOLN
INTRAMUSCULAR | Status: AC
  Filled 2024-05-25: qty 2

## 2024-05-25 MED ORDER — FENTANYL CITRATE (PF) 100 MCG/2ML IJ SOLN
INTRAMUSCULAR | Status: AC
  Filled 2024-05-25: qty 2

## 2024-05-25 MED ORDER — EPHEDRINE SULFATE (PRESSORS) 25 MG/5ML IV SOSY
PREFILLED_SYRINGE | INTRAVENOUS | Status: DC | PRN
  Administered 2024-05-25 (×2): 10 mg via INTRAVENOUS

## 2024-05-25 MED ORDER — FENTANYL CITRATE (PF) 50 MCG/ML IJ SOSY: 25.0000 ug | PREFILLED_SYRINGE | INTRAMUSCULAR | Status: DC | PRN

## 2024-05-25 MED ORDER — PHENYLEPHRINE 80 MCG/ML (10ML) SYRINGE FOR IV PUSH (FOR BLOOD PRESSURE SUPPORT)
PREFILLED_SYRINGE | INTRAVENOUS | Status: DC | PRN
  Administered 2024-05-25: 160 ug via INTRAVENOUS
  Administered 2024-05-25: 80 ug via INTRAVENOUS
  Administered 2024-05-25: 160 ug via INTRAVENOUS

## 2024-05-25 MED ORDER — LIDOCAINE HCL (CARDIAC) PF 100 MG/5ML IV SOSY
PREFILLED_SYRINGE | INTRAVENOUS | Status: DC | PRN
  Administered 2024-05-25: 80 mg via INTRAVENOUS

## 2024-05-25 MED ORDER — MEPERIDINE HCL 25 MG/ML IJ SOLN: 6.2500 mg | INTRAMUSCULAR | Status: DC | PRN

## 2024-05-25 MED ORDER — DEXAMETHASONE SOD PHOSPHATE PF 10 MG/ML IJ SOLN
INTRAMUSCULAR | Status: DC | PRN
  Administered 2024-05-25: 8 mg via INTRAVENOUS

## 2024-05-25 MED ORDER — MIDAZOLAM HCL 5 MG/5ML IJ SOLN
INTRAMUSCULAR | Status: DC | PRN
  Administered 2024-05-25: 2 mg via INTRAVENOUS

## 2024-05-25 MED ORDER — ONDANSETRON HCL 4 MG/2ML IJ SOLN: 4.0000 mg | Freq: Once | INTRAMUSCULAR | Status: DC | PRN

## 2024-05-25 MED ADMIN — Ondansetron HCl Inj 4 MG/2ML (2 MG/ML): 4 mg | INTRAVENOUS | NDC 00409475518

## 2024-05-25 SURGICAL SUPPLY — 16 items
BAG URINE DRAIN 2000ML AR STRL (UROLOGICAL SUPPLIES) IMPLANT
BAG URO CATCHER STRL LF (MISCELLANEOUS) ×1 IMPLANT
CATH FOLEY 2WAY SLVR 5CC 18FR (CATHETERS) IMPLANT
DRAPE FOOT SWITCH (DRAPES) ×1 IMPLANT
ELECT REM PT RETURN 15FT ADLT (MISCELLANEOUS) IMPLANT
GLOVE BIO SURGEON STRL SZ7.5 (GLOVE) ×1 IMPLANT
GOWN STRL REUS W/ TWL XL LVL3 (GOWN DISPOSABLE) ×1 IMPLANT
KIT TURNOVER KIT A (KITS) ×1 IMPLANT
LOOP CUT BIPOLAR 24F LRG (ELECTROSURGICAL) IMPLANT
MANIFOLD NEPTUNE II (INSTRUMENTS) ×1 IMPLANT
PACK CYSTO (CUSTOM PROCEDURE TRAY) ×1 IMPLANT
PAD TELFA 2X3 NADH STRL (GAUZE/BANDAGES/DRESSINGS) IMPLANT
PLUG CATH AND CAP STRL 200 (CATHETERS) IMPLANT
SYRINGE TOOMEY IRRIG 70ML (MISCELLANEOUS) IMPLANT
TUBING CONNECTING 10 (TUBING) ×1 IMPLANT
TUBING UROLOGY SET (TUBING) IMPLANT

## 2024-05-25 NOTE — Interval H&P Note (Signed)
 History and Physical Interval Note:  05/25/2024 2:33 PM  CALLUM WOLF  has presented today for surgery, with the diagnosis of BLADDER CANCER.  The various methods of treatment have been discussed with the patient and family. After consideration of risks, benefits and other options for treatment, the patient has consented to  Procedures with comments: TURBT, WITH CHEMOTHERAPEUTIC AGENT INSTILLATION INTO BLADDER (N/A) - INSTILL GEMCITABINE  INTO TH4E BLADDER as a surgical intervention.  The patient's history has been reviewed, patient examined, no change in status, stable for surgery.  I have reviewed the patient's chart and labs.  Questions were answered to the patient's satisfaction.     Sherwood JONETTA Edison, III

## 2024-05-25 NOTE — Discharge Instructions (Addendum)
 Transurethral Resection of Bladder Tumor (TURBT) or Bladder Biopsy  You may restart your warfarin on Monday morning as long as your urine is clear without blood  Definition:  Transurethral Resection of the Bladder Tumor is a surgical procedure used to diagnose and remove tumors within the bladder. TURBT is the most common treatment for early stage bladder cancer.  General instructions:     Your recent bladder surgery requires very little post hospital care but some definite precautions.  Despite the fact that no skin incisions were used, the area around the bladder incisions are raw and covered with scabs to promote healing and prevent bleeding. Certain precautions are needed to insure that the scabs are not disturbed over the next 2-4 weeks while the healing proceeds.  Because the raw surface inside your bladder and the irritating effects of urine you may expect frequency of urination and/or urgency (a stronger desire to urinate) and perhaps even getting up at night more often. This will usually resolve or improve slowly over the healing period. You may see some blood in your urine over the first 6 weeks. Do not be alarmed, even if the urine was clear for a while. Get off your feet and drink lots of fluids until clearing occurs. If you start to pass clots or don't improve call us .  Diet:  You may return to your normal diet immediately. Because of the raw surface of your bladder, alcohol, spicy foods, foods high in acid and drinks with caffeine may cause irritation or frequency and should be used in moderation. To keep your urine flowing freely and avoid constipation, drink plenty of fluids during the day (8-10 glasses). Tip: Avoid cranberry juice because it is very acidic.  Activity:  Your physical activity doesn't need to be restricted. However, if you are very active, you may see some blood in the urine. We suggest that you reduce your activity under the circumstances until the bleeding has  stopped.  Bowels:  It is important to keep your bowels regular during the postoperative period. Straining with bowel movements can cause bleeding. A bowel movement every other day is reasonable. Use a mild laxative if needed, such as milk of magnesia 2-3 tablespoons, or 2 Dulcolax tablets. Call if you continue to have problems. If you had been taking narcotics for pain, before, during or after your surgery, you may be constipated. Take a laxative if necessary.    Medication:  You should resume your pre-surgery medications unless told not to. In addition you may be given an antibiotic to prevent or treat infection. Antibiotics are not always necessary. All medication should be taken as prescribed until the bottles are finished unless you are having an unusual reaction to one of the drugs.

## 2024-05-25 NOTE — Op Note (Addendum)
 Operative Note  Preoperative diagnosis:  1.  Bladder tumor  Postoperative diagnosis: 1.  Bladder tumor-/medium  Procedure(s): 1.  Transurethral resection of bladder tumor--medium, 3 cm  Surgeon: Sherwood Edison, MD  Assistants: None  Anesthesia: General  Complications: None immediate  EBL: Minimal  Specimens: 1.  Bladder tumor  Drains/Catheters: 1.  None  Intraoperative findings: 1.  Normal anterior urethra 2.  Obstructing prostate with bilobar hypertrophy and a high riding bladder neck 3.  Bladder mucosa with prior TUR scar on the right lateral wall.  More inferiorly and posteriorly and medially, there was subtle irregularity of the bladder mucosa may represent early bladder tumor.  This was resected and fulgurated.  Indication: 63 year old male with history of bladder cancer status post TURBT found to have a possible recurrence presents for the previously mentioned operation.  Description of procedure:  The patient was identified and consent was obtained.  The patient was taken to the operating room and placed in the supine position.  The patient was placed under general anesthesia.  Perioperative antibiotics were administered.  The patient was placed in dorsal lithotomy.  Patient was prepped and draped in a standard sterile fashion and a timeout was performed.  A 26 French resectoscope with visual obturator in place was advanced into the urethra and into the bladder.  Complete cystoscopy was performed with the findings noted above.  I exchanged for a bipolar working element and resected the area of interest and fulgurated the resection bed as well as the surrounding area with a total fulguration area of approximately 3 cm.  All abnormal mucosa was fulgurated.  I collected the specimen.  I inspected bladder mucosa and there was no evidence of active bleeding.  No evidence of perforation.  The mucosal irregularity was subtle and may not represent recurrence so I elected against  gemcitabine  instillation.  I drained the bladder withdrew the scope.  Patient tolerated the procedure well was stable postoperatively.  Plan: Follow-up in 1 week for pathology review

## 2024-05-25 NOTE — Transfer of Care (Signed)
 Immediate Anesthesia Transfer of Care Note  Patient: Michael [name]  Procedure(s) Performed: TURBT, WITH CHEMOTHERAPEUTIC AGENT INSTILLATION INTO BLADDER  Patient Location: PACU  Anesthesia Type:General  Level of Consciousness: awake, alert , oriented, and patient cooperative  Airway & Oxygen Therapy: Patient Spontanous Breathing and Patient connected to face mask oxygen  Post-op Assessment: Report given to RN and Post -op Vital signs reviewed and stable  Post vital signs: Reviewed and stable  Last Vitals:  Vitals Value Taken Time  BP 150/88 05/25/24 15:41  Temp 36.6 C 05/25/24 15:41  Pulse 78 05/25/24 15:42  Resp 16 05/25/24 15:42  SpO2 99 % 05/25/24 15:42  Vitals shown include unfiled device data.  Last Pain:  Vitals:   05/25/24 1300  TempSrc:   PainSc: 0-No pain      Patients Stated Pain Goal: 4 (05/25/24 1300)  Complications: No notable events documented.

## 2024-05-25 NOTE — Anesthesia Procedure Notes (Signed)
 Procedure Name: Intubation Date/Time: 05/25/2024 2:51 PM  Performed by: Nada Corean CROME, CRNAPre-anesthesia Checklist: Emergency Drugs available, Suction available, Patient being monitored, Timeout performed and Patient identified Patient Re-evaluated:Patient Re-evaluated prior to induction Oxygen Delivery Method: Circle system utilized Preoxygenation: Pre-oxygenation with 100% oxygen Induction Type: IV induction Ventilation: Two handed mask ventilation required Laryngoscope Size: Mac and 4 Grade View: Grade II Tube type: Oral Tube size: 7.5 mm Number of attempts: 2 Airway Equipment and Method: Stylet and Oral airway Placement Confirmation: ETT inserted through vocal cords under direct vision, breath sounds checked- equal and bilateral and positive ETCO2 Secured at: 21 cm Tube secured with: Tape Dental Injury: Teeth and Oropharynx as per pre-operative assessment

## 2024-05-25 NOTE — Anesthesia Postprocedure Evaluation (Signed)
 Anesthesia Post Note  Patient: Michael [name]  Procedure(s) Performed: TURBT, WITH CHEMOTHERAPEUTIC AGENT INSTILLATION INTO BLADDER     Patient location during evaluation: PACU Anesthesia Type: General Level of consciousness: awake and alert Pain management: pain level controlled Vital Signs Assessment: post-procedure vital signs reviewed and stable Respiratory status: spontaneous breathing, nonlabored ventilation, respiratory function stable and patient connected to nasal cannula oxygen Cardiovascular status: blood pressure returned to baseline and stable Postop Assessment: no apparent nausea or vomiting Anesthetic complications: no   No notable events documented.  Last Vitals:  Vitals:   05/25/24 1545 05/25/24 1600  BP: (!) 156/97 (!) 143/78  Pulse: 79 74  Resp: (!) 21 18  Temp:    SpO2: 94% 93%    Last Pain:  Vitals:   05/25/24 1600  TempSrc:   PainSc: 0-No pain                 Yeray Tomas

## 2024-05-28 ENCOUNTER — Telehealth: Payer: Self-pay | Admitting: Cardiovascular Disease

## 2024-05-28 MED ORDER — NITROGLYCERIN 0.4 MG SL SUBL: 0.4000 mg | SUBLINGUAL_TABLET | SUBLINGUAL | 2 refills | Status: AC | PRN

## 2024-05-28 NOTE — Telephone Encounter (Signed)
 Refill sent

## 2024-05-28 NOTE — Telephone Encounter (Signed)
°*  STAT* If patient is at the pharmacy, call can be transferred to refill team.   1. Which medications need to be refilled? (please list name of each medication and dose if known) nitroGLYCERIN  (NITROSTAT ) 0.4 MG SL tablet    2. Would you like to learn more about the convenience, safety, & potential cost savings by using the [facility]? No    3. Are you open to using the [facility] (Type [facility]. No    4. Which pharmacy/location (including street and city if local pharmacy) is medication to be sent to? [id number] - [place] ([place]), [place] - [address]     5. Do they need a 30 day or 90 day supply? 1 vial

## 2024-05-29 LAB — SURGICAL PATHOLOGY

## 2024-05-30 ENCOUNTER — Ambulatory Visit: Payer: Self-pay | Admitting: Internal Medicine

## 2024-06-01 ENCOUNTER — Ambulatory Visit: Attending: Cardiovascular Disease | Admitting: Pharmacist

## 2024-06-01 DIAGNOSIS — I824Y1 Acute embolism and thrombosis of unspecified deep veins of right proximal lower extremity: Secondary | ICD-10-CM

## 2024-06-01 DIAGNOSIS — Z5181 Encounter for therapeutic drug level monitoring: Secondary | ICD-10-CM

## 2024-06-01 DIAGNOSIS — I2699 Other pulmonary embolism without acute cor pulmonale: Secondary | ICD-10-CM

## 2024-06-01 LAB — POCT INR: INR: 1.4 — AB (ref 2.0–3.0)

## 2024-06-01 NOTE — Patient Instructions (Signed)
 Description   INR 1.4, take 2 tablets tomorrow and then  resume taking warfarin 1 tablet daily except for 2 tablets on Mondays, Wednesdays and Fridays.  Recheck INR in 1 week post procedure (normally 6 weeks).  Keep dark green leafy veggies consistent in your diet at least 2-3 times in your diet (collards, mixed greens, cabbage, broccoli, or spinach). [facility] [phone].

## 2024-06-01 NOTE — Progress Notes (Signed)
 Description   INR 1.4, take 2 tablets tomorrow and then  resume taking warfarin 1 tablet daily except for 2 tablets on Mondays, Wednesdays and Fridays.  Recheck INR in 1 week post procedure (normally 6 weeks).  Keep dark green leafy veggies consistent in your diet at least 2-3 times in your diet (collards, mixed greens, cabbage, broccoli, or spinach). [facility] [phone].

## 2024-06-08 ENCOUNTER — Ambulatory Visit

## 2024-06-11 ENCOUNTER — Ambulatory Visit: Attending: Cardiovascular Disease

## 2024-06-11 DIAGNOSIS — I824Y1 Acute embolism and thrombosis of unspecified deep veins of right proximal lower extremity: Secondary | ICD-10-CM | POA: Diagnosis not present

## 2024-06-11 DIAGNOSIS — Z5181 Encounter for therapeutic drug level monitoring: Secondary | ICD-10-CM | POA: Diagnosis not present

## 2024-06-11 DIAGNOSIS — I2699 Other pulmonary embolism without acute cor pulmonale: Secondary | ICD-10-CM | POA: Diagnosis not present

## 2024-06-11 LAB — POCT INR: INR: 2.1 (ref 2.0–3.0)

## 2024-06-11 NOTE — Patient Instructions (Signed)
 resume taking warfarin 1 tablet daily except for 2 tablets on Mondays, Wednesdays and Fridays.  Recheck INR in 6 weeks.  Keep dark green leafy veggies consistent in your diet at least 2-3 times in your diet (collards, mixed greens, cabbage, broccoli, or spinach). [facility] [phone].

## 2024-06-11 NOTE — Progress Notes (Signed)
"   INR 2.1 Please see anticoagulation encounter resume taking warfarin 1 tablet daily except for 2 tablets on Mondays, Wednesdays and Fridays.  Recheck INR in 6 weeks.  Keep dark green leafy veggies consistent in your diet at least 2-3 times in your diet (collards, mixed greens, cabbage, broccoli, or spinach). [facility] [phone].  "

## 2024-07-10 ENCOUNTER — Ambulatory Visit: Payer: Self-pay | Admitting: Internal Medicine

## 2024-07-16 ENCOUNTER — Other Ambulatory Visit: Payer: Self-pay | Admitting: *Deleted

## 2024-07-16 DIAGNOSIS — Z5181 Encounter for therapeutic drug level monitoring: Secondary | ICD-10-CM

## 2024-07-16 DIAGNOSIS — I2699 Other pulmonary embolism without acute cor pulmonale: Secondary | ICD-10-CM

## 2024-07-16 DIAGNOSIS — I824Y1 Acute embolism and thrombosis of unspecified deep veins of right proximal lower extremity: Secondary | ICD-10-CM

## 2024-07-16 MED ORDER — WARFARIN SODIUM 5 MG PO TABS: ORAL_TABLET | ORAL | 1 refills | Status: AC

## 2024-07-16 NOTE — Telephone Encounter (Signed)
 Warfarin 5mg  Dx-DVT & PE Last INR Check-06/11/24 Last OV- 11/23/23 pre op appt & 12/28/22 ov

## 2024-07-18 ENCOUNTER — Other Ambulatory Visit: Payer: Self-pay | Admitting: Pharmacist

## 2024-07-18 NOTE — Progress Notes (Signed)
 Pharmacy Quality Measure Review  This patient is appearing on a report for the adherence measure for hypertension (ACEi/ARB) medications this calendar year.   Medication: lisinopril  Last fill date: 04/30/24, dispensed 05/01/24 for 90 day supply.  Reminder set for next fill.   Herlene Fleeta Morris, PharmD, JAQUELINE, CPP Clinical Pharmacist [facility] [phone]

## 2024-07-20 ENCOUNTER — Encounter (INDEPENDENT_AMBULATORY_CARE_PROVIDER_SITE_OTHER): Payer: Self-pay | Admitting: *Deleted

## 2024-07-23 ENCOUNTER — Ambulatory Visit

## 2024-08-14 ENCOUNTER — Ambulatory Visit: Admitting: Internal Medicine
